# Patient Record
Sex: Male | Born: 1960
Health system: Southern US, Community
[De-identification: ages and names within clinical notes are randomized; demographics above are authoritative.]

## PROBLEM LIST (undated history)

## (undated) DIAGNOSIS — I509 Heart failure, unspecified: Secondary | ICD-10-CM

## (undated) DIAGNOSIS — E669 Obesity, unspecified: Secondary | ICD-10-CM

## (undated) DIAGNOSIS — Q254 Congenital malformation of aorta unspecified: Secondary | ICD-10-CM

## (undated) DIAGNOSIS — I10 Essential (primary) hypertension: Secondary | ICD-10-CM

## (undated) DIAGNOSIS — I4891 Unspecified atrial fibrillation: Secondary | ICD-10-CM

## (undated) DIAGNOSIS — I519 Heart disease, unspecified: Secondary | ICD-10-CM

## (undated) DIAGNOSIS — M1990 Unspecified osteoarthritis, unspecified site: Secondary | ICD-10-CM

## (undated) DIAGNOSIS — I4819 Other persistent atrial fibrillation: Secondary | ICD-10-CM

## (undated) DIAGNOSIS — J90 Pleural effusion, not elsewhere classified: Secondary | ICD-10-CM

## (undated) DIAGNOSIS — G4733 Obstructive sleep apnea (adult) (pediatric): Secondary | ICD-10-CM

## (undated) DIAGNOSIS — I2699 Other pulmonary embolism without acute cor pulmonale: Secondary | ICD-10-CM

## (undated) DIAGNOSIS — E785 Hyperlipidemia, unspecified: Secondary | ICD-10-CM

## (undated) DIAGNOSIS — R911 Solitary pulmonary nodule: Secondary | ICD-10-CM

## (undated) HISTORY — DX: Obesity, unspecified: E66.9

## (undated) HISTORY — PX: PROSTATECTOMY: SHX69

## (undated) HISTORY — PX: VASECTOMY: SHX75

## (undated) HISTORY — DX: Pleural effusion, not elsewhere classified: J90

## (undated) HISTORY — PX: PROSTATE BIOPSY: SHX241

## (undated) HISTORY — DX: Solitary pulmonary nodule: R91.1

## (undated) HISTORY — PX: SHOULDER SURGERY: SHX246

## (undated) HISTORY — DX: Heart disease, unspecified: I51.9

## (undated) HISTORY — DX: Hyperlipidemia, unspecified: E78.5

## (undated) HISTORY — PX: APPENDECTOMY: SHX54

## (undated) HISTORY — DX: Other persistent atrial fibrillation: I48.19

## (undated) HISTORY — DX: Heart failure, unspecified: I50.9

## (undated) HISTORY — DX: Essential (primary) hypertension: I10

## (undated) HISTORY — DX: Congenital malformation of aorta unspecified: Q25.40

---
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
## 2015-08-04 ENCOUNTER — Ambulatory Visit (INDEPENDENT_AMBULATORY_CARE_PROVIDER_SITE_OTHER): Payer: Worker's Compensation | Admitting: Family Medicine

## 2015-08-04 ENCOUNTER — Ambulatory Visit: Payer: Worker's Compensation

## 2015-08-04 VITALS — BP 108/84 | HR 81 | Temp 98.2°F | Resp 16 | Ht 69.0 in | Wt 231.4 lb

## 2015-08-04 DIAGNOSIS — M79644 Pain in right finger(s): Secondary | ICD-10-CM

## 2015-08-04 DIAGNOSIS — S61011A Laceration without foreign body of right thumb without damage to nail, initial encounter: Secondary | ICD-10-CM

## 2015-08-04 DIAGNOSIS — Z23 Encounter for immunization: Secondary | ICD-10-CM | POA: Diagnosis not present

## 2015-08-04 NOTE — Progress Notes (Signed)
Procedure: Verbal consent obtained. Skin was anesthetized with 3 cc 1% lido without epi and cleaned with soap and water. Wound explored -  No tendon involvement. FROM and strength intact. Laceration located to right thumb was sutured with #2 horizontal 5.0 ethilon sutures. Wound was dressed and wound care discussed.

## 2015-08-04 NOTE — Patient Instructions (Signed)
WOUND CARE Please return in 10 days to have your stitches/staples removed or sooner if you have concerns. . Keep area clean and dry with dressing in place for 24 hours. . After 24 hours, remove bandage and wash wound gently with mild soap and warm water. When skin is dry, reapply a new bandage. Once wound is no longer draining, may leave wound open to air. . Continue daily cleansing with soap and water until stitches/staples are removed. . Do not apply any ointments or creams to the wound while stitches/staples are in place, as this may cause delayed healing. . Notify the office if you experience any of the following signs of infection: Swelling, redness, pus drainage, streaking, fever >101.0 F . Notify the office if you experience excessive bleeding that does not stop after 15-20 minutes of constant, firm pressure.   

## 2015-08-04 NOTE — Progress Notes (Signed)
      Chief Complaint:  Chief Complaint  Patient presents with  . Hand Pain    right hand,right thumb, hurt his thunb while working on a car    HPI: Jeffrey Griffin is a 54 y.o. male who reports to [facility] today complaining of right thumb pain after hitting it against a radiator while working on a [employer] car at work, he was trying to turn a Advertising account planner and overturned it and hit the right thumb on a sharp corner. He has pain and swelling, and decrease ROM, He is worried about having a tendon rupture. He is not UTD on tetanus. He is right hand dominant. Pain radiates up to wrist . He cleaned it with soap and water.   History reviewed. No pertinent past medical history. Past Surgical History  Procedure Laterality Date  . Vasectomy     Social History   Social History  . Marital Status: Single    Spouse Name: N/A  . Number of Children: N/A  . Years of Education: N/A   Social History Main Topics  . Smoking status: Never Smoker   . Smokeless tobacco: None  . Alcohol Use: 0.0 oz/week    0 Standard drinks or equivalent per week  . Drug Use: None  . Sexual Activity: Not Asked   Other Topics Concern  . None   Social History Narrative  . None   History reviewed. No pertinent family history. No Known Allergies Prior to Admission medications   Not on File     ROS: The patient denies fevers, chills, night sweats, unintentional weight loss, chest pain, palpitations, wheezing, dyspnea on exertion, nausea, vomiting, abdominal pain, dysuria, hematuria, melena  All other systems have been reviewed and were otherwise negative with the exception of those mentioned in the HPI and as above.    PHYSICAL EXAM: Filed Vitals:   08/04/15 1925  BP: 108/84  Pulse: 81  Temp: 98.2 F (36.8 C)  Resp: 16   Body mass index is 34.16 kg/(m^2).   General: Alert, no acute distress HEENT:  Normocephalic, atraumatic, oropharynx patent. EOMI, PERRLA Cardiovascular:  Regular rate and rhythm, no rubs  murmurs or gallops.   Respiratory: Clear to auscultation bilaterally.  No wheezes, rales, or rhonchi.  No cyanosis, no use of accessory musculature Abdominal: No organomegaly, abdomen is soft and non-tender, positive bowel sounds. No masses. Skin: No rashes. Neurologic: Facial musculature symmetric. Psychiatric: Patient acts appropriately throughout our interaction. Musculoskeletal: Gait intact.  Right thumb Decrease ROM due to  anxiety about possible tendon rupture Full sensation Vascularly intact, patient was numbed and per PA-C he had full ROM and also no neurovasc compromise 5/5 sternght 1 inc laceration, no erythema, + swelling   LABS: No results found for this or any previous visit.   EKG/XRAY:   Primary read interpreted by Dr. Marin Comment at [facility]. Neg for fracture or dislocation    ASSESSMENT/PLAN: Encounter Diagnoses  Name Primary?  . Thumb pain, right Yes  . Thumb laceration, right, initial encounter   . Need for prophylactic vaccination with tetanus-diphtheria (TD)    Tetanus vaccine give Decline pain meds No abx at this time, monitor for infection Fu prn or as directed Wound care as directed   Gross sideeffects, risk and benefits, and alternatives of medications d/w patient. Patient is aware that all medications have potential sideeffects and we are unable to predict every sideeffect or drug-drug interaction that may occur.  Takahiro Godinho DO  08/04/2015 8:12 PM

## 2015-08-14 ENCOUNTER — Other Ambulatory Visit: Payer: Self-pay | Admitting: Family Medicine

## 2015-08-14 ENCOUNTER — Ambulatory Visit (INDEPENDENT_AMBULATORY_CARE_PROVIDER_SITE_OTHER): Payer: Managed Care, Other (non HMO) | Admitting: Family Medicine

## 2015-08-14 VITALS — BP 126/80 | HR 51 | Temp 97.6°F | Resp 18 | Ht 69.75 in | Wt 229.0 lb

## 2015-08-14 DIAGNOSIS — E785 Hyperlipidemia, unspecified: Secondary | ICD-10-CM

## 2015-08-14 DIAGNOSIS — Z0000 Encounter for general adult medical examination without abnormal findings: Secondary | ICD-10-CM | POA: Diagnosis not present

## 2015-08-14 LAB — LIPID PANEL
Cholesterol: 224 mg/dL — ABNORMAL HIGH (ref 125–200)
HDL: 42 mg/dL (ref >=40)
LDL Cholesterol: 154 mg/dL — ABNORMAL HIGH (ref <130)
Total CHOL/HDL Ratio: 5.3 Ratio — ABNORMAL HIGH (ref <=5.0)
Triglycerides: 140 mg/dL (ref <150)
VLDL: 28 mg/dL (ref <30)

## 2015-08-14 LAB — CBC WITH DIFFERENTIAL/PLATELET
Basophils Absolute: 0.1 10*3/uL (ref 0.0–0.1)
Basophils Relative: 1 % (ref 0–1)
Eosinophils Absolute: 0.2 10*3/uL (ref 0.0–0.7)
Eosinophils Relative: 3 % (ref 0–5)
HCT: 46.8 % (ref 39.0–52.0)
Hemoglobin: 16 g/dL (ref 13.0–17.0)
Lymphocytes Relative: 26 % (ref 12–46)
Lymphs Abs: 1.4 10*3/uL (ref 0.7–4.0)
MCH: 29.9 pg (ref 26.0–34.0)
MCHC: 34.2 g/dL (ref 30.0–36.0)
MCV: 87.5 fL (ref 78.0–100.0)
MPV: 10.2 fL (ref 8.6–12.4)
Monocytes Absolute: 0.6 10*3/uL (ref 0.1–1.0)
Monocytes Relative: 11 % (ref 3–12)
Neutro Abs: 3.2 10*3/uL (ref 1.7–7.7)
Neutrophils Relative %: 59 % (ref 43–77)
Platelets: 246 10*3/uL (ref 150–400)
RBC: 5.35 MIL/uL (ref 4.22–5.81)
RDW: 12.9 % (ref 11.5–15.5)
WBC: 5.5 10*3/uL (ref 4.0–10.5)

## 2015-08-14 LAB — COMPLETE METABOLIC PANEL WITH GFR
ALT: 26 U/L (ref 9–46)
AST: 22 U/L (ref 10–35)
Albumin: 4.2 g/dL (ref 3.6–5.1)
Alkaline Phosphatase: 84 U/L (ref 40–115)
BUN: 15 mg/dL (ref 7–25)
CO2: 28 mmol/L (ref 20–31)
Calcium: 9.9 mg/dL (ref 8.6–10.3)
Chloride: 100 mmol/L (ref 98–110)
Creat: 0.8 mg/dL (ref 0.70–1.33)
GFR, Est African American: >89 mL/min (ref >=60)
GFR, Est Non African American: >89 mL/min (ref >=60)
Glucose, Bld: 105 mg/dL — ABNORMAL HIGH (ref 65–99)
Potassium: 4.9 mmol/L (ref 3.5–5.3)
Sodium: 134 mmol/L — ABNORMAL LOW (ref 135–146)
Total Bilirubin: 0.5 mg/dL (ref 0.2–1.2)
Total Protein: 6.9 g/dL (ref 6.1–8.1)

## 2015-08-14 LAB — POCT URINALYSIS DIP (MANUAL ENTRY)
Bilirubin, UA: NEGATIVE
Blood, UA: NEGATIVE
Glucose, UA: NEGATIVE
Ketones, POC UA: NEGATIVE
Leukocytes, UA: NEGATIVE
Nitrite, UA: NEGATIVE
Protein Ur, POC: NEGATIVE
Spec Grav, UA: 1.025
Urobilinogen, UA: 0.2
pH, UA: 6

## 2015-08-14 MED ORDER — ATORVASTATIN CALCIUM 10 MG PO TABS: 10.0000 mg | ORAL_TABLET | Freq: Every day | ORAL | Status: DC

## 2015-08-14 NOTE — Progress Notes (Signed)
Patient ID: Jeffrey Griffin, male   DOB: [birth year], 54 y.o.   MRN: [id number]   This chart was scribed for Jeffrey Haber, MD by [facility], medical scribe at [facility].The patient was seen in exam room 13 and the patient's care was started at 9:58 AM.  Patient ID: [name] MRN: [id number], DOB: [birth year], 54 y.o. Date of Encounter: 08/14/2015  Primary Physician: No primary care provider on file.  Chief Complaint:  Chief Complaint  Patient presents with  . Annual Exam    CPE   HPI:  [name] is a 54 y.o. male who presents to [facility] for and annual exam. Cut his right thumb recently and has stiches in place. Has some right shoulder pain and numbness/tingling running down his right arm. No notable FHx. Declines the flu shot today. He would like to schedule a colonoscopy.  He is a Dealer, works 9 hr/day.  History reviewed. No pertinent past medical history.   Home Meds: Prior to Admission medications   Not on File   Allergies: No Known Allergies  Social History   Social History  . Marital Status: Single    Spouse Name: N/A  . Number of Children: N/A  . Years of Education: N/A   Occupational History  . Not on file.   Social History Main Topics  . Smoking status: Never Smoker   . Smokeless tobacco: Not on file  . Alcohol Use: 0.0 oz/week    0 Standard drinks or equivalent per week  . Drug Use: Not on file  . Sexual Activity: Not on file   Other Topics Concern  . Not on file   Social History Narrative    Review of Systems: Constitutional: negative for chills, fever, night sweats, weight changes, or fatigue  HEENT: negative for vision changes, hearing loss, congestion, rhinorrhea, ST, epistaxis, or sinus pressure Cardiovascular: negative for chest pain or palpitations Respiratory: negative for hemoptysis, wheezing, shortness of breath, or cough Abdominal: negative for abdominal pain, nausea, vomiting, diarrhea,  or constipation. Msk. Right shoulder pain. Dermatological: negative for rash Neurologic: negative for headache, dizziness, or syncope. Positive for numbness/tingling.  All other systems reviewed and are otherwise negative with the exception to those above and in the HPI.  Physical Exam: Blood pressure 126/80, pulse 51, temperature 97.6 F (36.4 C), temperature source Oral, resp. rate 18, height 5' 9.75" (1.772 m), weight 229 lb (103.874 kg), SpO2 98 %., Body mass index is 33.08 kg/(m^2). General: Well developed, well nourished, in no acute distress. Head: Normocephalic, atraumatic, eyes without discharge, sclera non-icteric, nares are without discharge. Bilateral auditory canals clear, TM's are without perforation, pearly grey and translucent with reflective cone of light bilaterally. Oral cavity moist, posterior pharynx without exudate, erythema, peritonsillar abscess, or post nasal drip.  Neck: Supple. No thyromegaly. Full ROM. No lymphadenopathy. Lungs: Clear bilaterally to auscultation without wheezes, rales, or rhonchi. Breathing is unlabored. Heart: RRR with S1 S2. No murmurs, rubs, or gallops appreciated. Abdomen: Soft, non-tender, non-distended with normoactive bowel sounds. No hepatomegaly. No rebound/guarding. No obvious abdominal masses. Msk:  Strength and tone normal for age. Extremities/Skin: Warm and dry. No clubbing or cyanosis. No edema. No rashes or suspicious lesions. Sutures in right thumb were removed. The wound is well-healed and patient has full range of motion. Neuro: Alert and oriented X 3. Moves all extremities spontaneously. Gait is normal. CNII-XII grossly in tact. Psych:  Responds to questions appropriately with a normal affect.  ASSESSMENT AND PLAN:  54 y.o. year old male with generally good health. This chart was scribed in my presence and reviewed by me personally.    ICD-9-CM ICD-10-CM   1. Annual physical exam V70.0 123456 COMPLETE METABOLIC PANEL WITH GFR      Lipid panel     POCT urinalysis dipstick     Ambulatory referral to Gastroenterology     CBC with Differential/Platelet     CANCELED: POCT CBC     By signing my name below, I, Nadim Abuhashem, attest that this documentation has been prepared under the direction and in the presence of Jeffrey Haber, MD.  Electronically Signed: Lora Havens, medical scribe. 08/14/2015 10:24 AM.

## 2015-08-14 NOTE — Patient Instructions (Signed)

## 2015-10-09 ENCOUNTER — Ambulatory Visit (INDEPENDENT_AMBULATORY_CARE_PROVIDER_SITE_OTHER): Payer: Managed Care, Other (non HMO)

## 2015-10-09 ENCOUNTER — Ambulatory Visit (INDEPENDENT_AMBULATORY_CARE_PROVIDER_SITE_OTHER): Payer: Managed Care, Other (non HMO) | Admitting: Emergency Medicine

## 2015-10-09 VITALS — BP 148/94 | HR 62 | Temp 97.9°F | Resp 16 | Ht 69.5 in | Wt 232.4 lb

## 2015-10-09 DIAGNOSIS — R059 Cough, unspecified: Secondary | ICD-10-CM

## 2015-10-09 DIAGNOSIS — R05 Cough: Secondary | ICD-10-CM | POA: Diagnosis not present

## 2015-10-09 DIAGNOSIS — R0981 Nasal congestion: Secondary | ICD-10-CM

## 2015-10-09 MED ORDER — AMOXICILLIN 875 MG PO TABS: 875.0000 mg | ORAL_TABLET | Freq: Two times a day (BID) | ORAL | Status: DC

## 2015-10-09 MED ORDER — FLUTICASONE PROPIONATE 50 MCG/ACT NA SUSP: 2.0000 | Freq: Every day | NASAL | Status: DC

## 2015-10-09 MED ORDER — BENZONATATE 100 MG PO CAPS: 100.0000 mg | ORAL_CAPSULE | Freq: Three times a day (TID) | ORAL | Status: DC | PRN

## 2015-10-09 NOTE — Progress Notes (Addendum)
By signing my name below, I, Jeffrey Griffin, attest that this documentation has been prepared under the direction and in the presence of Jeffrey Queen, MD. Electronically Signed: Moises Griffin, [place]. 10/09/2015 , 11:06 AM .  Patient was seen in room 1 .  Chief Complaint:  Chief Complaint  Patient presents with  . Cough    Non productive  . Facial Pain    Right side, sinuses per pt    HPI: Jeffrey Griffin is a 55 y.o. male who reports to [facility] today complaining of intermittent cough with right sided sinus pressure that started about 6 weeks ago. He describes the cough being like a tickle in his throat. He's tried cough drops without any relief. When he blows his nose, it'd be clear. He denies history of smoking. He denies any changes at home. He denies feeling sick.   He works as a Dealer for 35 years. He denies any different.   History reviewed. No pertinent past medical history. Past Surgical History  Procedure Laterality Date  . Vasectomy    . Appendectomy     Social History   Social History  . Marital Status: Single    Spouse Name: N/A  . Number of Children: N/A  . Years of Education: N/A   Social History Main Topics  . Smoking status: Never Smoker   . Smokeless tobacco: None  . Alcohol Use: 0.0 oz/week    0 Standard drinks or equivalent per week  . Drug Use: None  . Sexual Activity: Not Asked   Other Topics Concern  . None   Social History Narrative   History reviewed. No pertinent family history. No Known Allergies Prior to Admission medications   Medication Sig Start Date End Date Taking? Authorizing Provider  atorvastatin (LIPITOR) 10 MG tablet Take 1 tablet (10 mg total) by mouth daily. Patient not taking: Reported on 10/09/2015 08/14/15   Robyn Haber, MD     ROS:  Constitutional: negative for fever, chills, night sweats, weight changes, or fatigue  HEENT: negative for vision changes, hearing loss, congestion, rhinorrhea, ST, epistaxis; positive  for sinus pressure Cardiovascular: negative for chest pain or palpitations Respiratory: negative for hemoptysis, wheezing, shortness of breath; positive for cough Abdominal: negative for abdominal pain, nausea, vomiting, diarrhea, or constipation Dermatological: negative for rash Neurologic: negative for headache, dizziness, or syncope All other systems reviewed and are otherwise negative with the exception to those above and in the HPI.  PHYSICAL EXAM: Filed Vitals:   10/09/15 1035  BP: 148/94  Pulse: 62  Temp: 97.9 F (36.6 C)  Resp: 16   Body mass index is 33.84 kg/(m^2).   General: Alert, no acute distress HEENT:  Normocephalic, atraumatic, oropharynx patent. Purulent discharge both nares, right maxillary sinus tenderness Eye: EOMI, PEERLDC Cardiovascular:  Regular rate and rhythm, no rubs murmurs or gallops.  No Carotid bruits, radial pulse intact. No pedal edema.  Respiratory: Clear to auscultation bilaterally.  No wheezes, rales, or rhonchi.  No cyanosis, no use of accessory musculature Abdominal: No organomegaly, abdomen is soft and non-tender, positive bowel sounds. No masses. Musculoskeletal: Gait intact. No edema, tenderness Skin: No rashes. Neurologic: Facial musculature symmetric. Psychiatric: Patient acts appropriately throughout our interaction.  Lymphatic: No cervical or submandibular lymphadenopathy Genitourinary/Anorectal: No acute findings  LABS:   EKG/XRAY:   Primary read interpreted by Dr. Everlene Farrier at [facility]. Dg Sinus 1-2 Views  10/09/2015  CLINICAL DATA:  Sinus congestion. EXAM: PARANASAL SINUSES - 1-2 VIEW COMPARISON:  None. FINDINGS: The  paranasal sinus are aerated. There is no evidence of sinus opacification air-fluid levels or mucosal thickening. No significant bone abnormalities are seen. IMPRESSION: No evidence of sinusitis. Electronically Signed   By: Marijo Conception, M.D.   On: 10/09/2015 12:03   Dg Chest 2 View  10/09/2015  CLINICAL DATA:  Cough for 6  weeks. EXAM: CHEST  2 VIEW COMPARISON:  None. FINDINGS: The heart size and mediastinal contours are within normal limits. Both lungs are clear. No pneumothorax or pleural effusion is noted. The visualized skeletal structures are unremarkable. IMPRESSION: No active cardiopulmonary disease. Electronically Signed   By: Marijo Conception, M.D.   On: 10/09/2015 12:02     ASSESSMENT/PLAN: We'll treat as sinusitis with Flonase amoxicillin and Tessalon Perles. X-rays of the chest and sinuses were unremarkable.I personally performed the services described in this documentation, which was scribed in my presence. The recorded information has been reviewed and is accurate.   Gross sideeffects, risk and benefits, and alternatives of medications d/w patient. Patient is aware that all medications have potential sideeffects and we are unable to predict every sideeffect or drug-drug interaction that may occur.  Jeffrey Queen MD 10/09/2015 11:06 AM

## 2015-10-09 NOTE — Patient Instructions (Addendum)
Because you received an x-ray today, you will receive an invoice from [facility]. Please contact [facility] at [phone] with questions or concerns regarding your invoice. Our billing staff will not be able to assist you with those questions. sinusitisSinusitis, Adult Sinusitis is redness, soreness, and inflammation of the paranasal sinuses. Paranasal sinuses are air pockets within the bones of your face. They are located beneath your eyes, in the middle of your forehead, and above your eyes. In healthy paranasal sinuses, mucus is able to drain out, and air is able to circulate through them by way of your nose. However, when your paranasal sinuses are inflamed, mucus and air can become trapped. This can allow bacteria and other germs to grow and cause infection. Sinusitis can develop quickly and last only a short time (acute) or continue over a long period (chronic). Sinusitis that lasts for more than 12 weeks is considered chronic. CAUSES Causes of sinusitis include:  Allergies.  Structural abnormalities, such as displacement of the cartilage that separates your nostrils (deviated septum), which can decrease the air flow through your nose and sinuses and affect sinus drainage.  Functional abnormalities, such as when the small hairs (cilia) that line your sinuses and help remove mucus do not work properly or are not present. SIGNS AND SYMPTOMS Symptoms of acute and chronic sinusitis are the same. The primary symptoms are pain and pressure around the affected sinuses. Other symptoms include:  Upper toothache.  Earache.  Headache.  Bad breath.  Decreased sense of smell and taste.  A cough, which worsens when you are lying flat.  Fatigue.  Fever.  Thick drainage from your nose, which often is green and may contain pus (purulent).  Swelling and warmth over the affected sinuses. DIAGNOSIS Your health care provider will perform a physical exam. During your exam,  your health care provider may perform any of the following to help determine if you have acute sinusitis or chronic sinusitis:  Look in your nose for signs of abnormal growths in your nostrils (nasal polyps).  Tap over the affected sinus to check for signs of infection.  View the inside of your sinuses using an imaging device that has a light attached (endoscope). If your health care provider suspects that you have chronic sinusitis, one or more of the following tests may be recommended:  Allergy tests.  Nasal culture. A sample of mucus is taken from your nose, sent to a lab, and screened for bacteria.  Nasal cytology. A sample of mucus is taken from your nose and examined by your health care provider to determine if your sinusitis is related to an allergy. TREATMENT Most cases of acute sinusitis are related to a viral infection and will resolve on their own within 10 days. Sometimes, medicines are prescribed to help relieve symptoms of both acute and chronic sinusitis. These may include pain medicines, decongestants, nasal steroid sprays, or saline sprays. However, for sinusitis related to a bacterial infection, your health care provider will prescribe antibiotic medicines. These are medicines that will help kill the bacteria causing the infection. Rarely, sinusitis is caused by a fungal infection. In these cases, your health care provider will prescribe antifungal medicine. For some cases of chronic sinusitis, surgery is needed. Generally, these are cases in which sinusitis recurs more than 3 times per year, despite other treatments. HOME CARE INSTRUCTIONS  Drink plenty of water. Water helps thin the mucus so your sinuses can drain more easily.  Use a humidifier.  Inhale steam 3-4 times  a day (for example, sit in the bathroom with the shower running).  Apply a warm, moist washcloth to your face 3-4 times a day, or as directed by your health care provider.  Use saline nasal sprays to  help moisten and clean your sinuses.  Take medicines only as directed by your health care provider.  If you were prescribed either an antibiotic or antifungal medicine, finish it all even if you start to feel better. SEEK IMMEDIATE MEDICAL CARE IF:  You have increasing pain or severe headaches.  You have nausea, vomiting, or drowsiness.  You have swelling around your face.  You have vision problems.  You have a stiff neck.  You have difficulty breathing.   This information is not intended to replace advice given to you by your health care provider. Make sure you discuss any questions you have with your health care provider.   Document Released: 08/21/2005 Document Revised: 09/11/2014 Document Reviewed: 09/05/2011 Elsevier Interactive Patient Education [employer].

## 2015-10-12 ENCOUNTER — Ambulatory Visit: Payer: Self-pay | Admitting: Family Medicine

## 2015-10-15 ENCOUNTER — Telehealth: Payer: Self-pay

## 2015-10-15 NOTE — Telephone Encounter (Signed)
DR L - Pt had a physical with you in December.  Needs form filled out for his health insurance.  I have placed this in your box.  He wants the completed form faxed to [phone] and also wants to pick up a copy.  His number is [phone].

## 2015-10-19 ENCOUNTER — Ambulatory Visit (INDEPENDENT_AMBULATORY_CARE_PROVIDER_SITE_OTHER): Payer: Managed Care, Other (non HMO)

## 2015-10-19 ENCOUNTER — Ambulatory Visit (INDEPENDENT_AMBULATORY_CARE_PROVIDER_SITE_OTHER): Payer: Managed Care, Other (non HMO) | Admitting: Physician Assistant

## 2015-10-19 VITALS — BP 150/82 | HR 83 | Temp 100.5°F | Resp 18 | Ht 70.0 in | Wt 234.6 lb

## 2015-10-19 DIAGNOSIS — R05 Cough: Secondary | ICD-10-CM

## 2015-10-19 DIAGNOSIS — R6889 Other general symptoms and signs: Secondary | ICD-10-CM

## 2015-10-19 DIAGNOSIS — R059 Cough, unspecified: Secondary | ICD-10-CM

## 2015-10-19 DIAGNOSIS — J101 Influenza due to other identified influenza virus with other respiratory manifestations: Secondary | ICD-10-CM

## 2015-10-19 LAB — POCT CBC
GRANULOCYTE PERCENT: 81.5 % — AB (ref 37–80)
HEMATOCRIT: 42.7 % — AB (ref 43.5–53.7)
HEMOGLOBIN: 14.7 g/dL (ref 14.1–18.1)
LYMPH, POC: 0.7 (ref 0.6–3.4)
MCH, POC: 30.4 pg (ref 27–31.2)
MCHC: 34.3 g/dL (ref 31.8–35.4)
MCV: 88.6 fL (ref 80–97)
MID (cbc): 0.5 (ref 0–0.9)
MPV: 7.5 fL (ref 0–99.8)
PLATELET COUNT, POC: 194 10*3/uL (ref 142–424)
POC GRANULOCYTE: 5.4 (ref 2–6.9)
POC LYMPH PERCENT: 10.5 %L (ref 10–50)
POC MID %: 8 %M (ref 0–12)
RBC: 4.82 M/uL (ref 4.69–6.13)
RDW, POC: 13.3 %
WBC: 6.6 10*3/uL (ref 4.6–10.2)

## 2015-10-19 LAB — POCT INFLUENZA A/B
INFLUENZA A, POC: POSITIVE — AB
INFLUENZA B, POC: NEGATIVE

## 2015-10-19 MED ORDER — GUAIFENESIN ER 1200 MG PO TB12: 1.0000 | ORAL_TABLET | Freq: Two times a day (BID) | ORAL | Status: AC

## 2015-10-19 MED ORDER — HYDROCOD POLST-CPM POLST ER 10-8 MG/5ML PO SUER: 5.0000 mL | Freq: Two times a day (BID) | ORAL | Status: DC | PRN

## 2015-10-19 MED ORDER — OSELTAMIVIR PHOSPHATE 75 MG PO CAPS: 75.0000 mg | ORAL_CAPSULE | Freq: Two times a day (BID) | ORAL | Status: AC

## 2015-10-19 NOTE — Patient Instructions (Addendum)
Because you received an x-ray today, you will receive an invoice from [facility] Radiology. Please contact [facility]iology at [phone] with questions or concerns regarding your invoice. Our billing staff will not be able to assist you with those questions.   Please push fluids.  Tylenol and Motrin for fever and body aches.    A humidifier can help especially when the air is dry -if you do not have a humidifier you can boil a pot of water on the stove in your home to help with the dry air.

## 2015-10-19 NOTE — Telephone Encounter (Signed)
Forms completed. Spoke with pt's significant other on the phone - let her know forms ready to be picked up.

## 2015-10-19 NOTE — Progress Notes (Signed)
Jeffrey Griffin  MRN: [id number] DOB: 1960/10/05  Subjective:  Pt presents to clinic for recheck.  He has not started to feel better since he was seen 2/4.  He has only 1 pill of the abx left and really does not feel like his symptoms have improved.  Then 2 days ago he started feeling terrible with headaches and myalgias and fever.  His chest wall is hurting from all the cough.    Home treatment - theraflu Sick contacts - girlfriend with the flu No flu vaccine this year.   There are no active problems to display for this patient.   Current Outpatient Prescriptions on File Prior to Visit  Medication Sig Dispense Refill  . amoxicillin (AMOXIL) 875 MG tablet Take 1 tablet (875 mg total) by mouth 2 (two) times daily. 20 tablet 0  . fluticasone (FLONASE) 50 MCG/ACT nasal spray Place 2 sprays into both nostrils daily. 16 g 6   No current facility-administered medications on file prior to visit.    No Known Allergies  Review of Systems  Constitutional: Positive for fever (101.5 ) and chills.  HENT: Positive for congestion and rhinorrhea (yellow).   Respiratory: Positive for cough (sputum for the last 2 days - yellow) and chest tightness. Negative for shortness of breath and wheezing.        No h/o asthma, nonsmoker   Gastrointestinal: Negative.   Musculoskeletal: Positive for myalgias.  Neurological: Positive for headaches.   Objective:  BP 150/82 mmHg  Pulse 83  Temp(Src) 100.5 F (38.1 C) (Oral)  Resp 18  Ht 5\' 10"  (1.778 m)  Wt 234 lb 9.6 oz (106.414 kg)  BMI 33.66 kg/m2  SpO2 95%  Physical Exam  Constitutional: He is oriented to person, place, and time and well-developed, well-nourished, and in no distress.  Pt is laying under blankets and appears to not feel well.  HENT:  Head: Normocephalic and atraumatic.  Right Ear: Hearing, tympanic membrane, external ear and ear canal normal.  Left Ear: Hearing, tympanic membrane, external ear and ear canal normal.  Nose: Nose  normal.  Mouth/Throat: Uvula is midline, oropharynx is clear and moist and mucous membranes are normal.  Eyes: Conjunctivae are normal.  Neck: Normal range of motion.  Cardiovascular: Normal rate, regular rhythm and normal heart sounds.   Pulmonary/Chest: Effort normal and breath sounds normal. He has no wheezes.  Lymphadenopathy:       Head (right side): No tonsillar adenopathy present.       Head (left side): No tonsillar adenopathy present.    He has no cervical adenopathy.       Right: No supraclavicular adenopathy present.       Left: No supraclavicular adenopathy present.  Neurological: He is alert and oriented to person, place, and time. Gait normal.  Skin: Skin is warm and dry.  Psychiatric: Mood, memory, affect and judgment normal.   Results for orders placed or performed in visit on 10/19/15  POCT CBC  Result Value Ref Range   WBC 6.6 4.6 - 10.2 K/uL   Lymph, poc 0.7 0.6 - 3.4   POC LYMPH PERCENT 10.5 10 - 50 %L   MID (cbc) 0.5 0 - 0.9   POC MID % 8.0 0 - 12 %M   POC Granulocyte 5.4 2 - 6.9   Granulocyte percent 81.5 (A) 37 - 80 %G   RBC 4.82 4.69 - 6.13 M/uL   Hemoglobin 14.7 14.1 - 18.1 g/dL   HCT, POC 42.7 (A) 43.5 -  53.7 %   MCV 88.6 80 - 97 fL   MCH, POC 30.4 27 - 31.2 pg   MCHC 34.3 31.8 - 35.4 g/dL   RDW, POC 13.3 %   Platelet Count, POC 194 142 - 424 K/uL   MPV 7.5 0 - 99.8 fL  POCT Influenza A/B  Result Value Ref Range   Influenza A, POC Positive (A) Negative   Influenza B, POC Negative Negative    Dg Sinus 1-2 Views  10/09/2015  CLINICAL DATA:  Sinus congestion. EXAM: PARANASAL SINUSES - 1-2 VIEW COMPARISON:  None. FINDINGS: The paranasal sinus are aerated. There is no evidence of sinus opacification air-fluid levels or mucosal thickening. No significant bone abnormalities are seen. IMPRESSION: No evidence of sinusitis. Electronically Signed   By: Marijo Conception, M.D.   On: 10/09/2015 12:03   Dg Chest 2 View  10/19/2015  CLINICAL DATA:  Follow-up  visit.  Fever and cough. EXAM: CHEST  2 VIEW COMPARISON:  10/09/2015 radiographs. FINDINGS: The heart size and mediastinal contours are normal. The lungs are clear. There is no pleural effusion or pneumothorax. No acute osseous findings are identified. IMPRESSION: Stable chest.  No active cardiopulmonary process. Electronically Signed   By: Richardean Sale M.D.   On: 10/19/2015 09:32   Dg Chest 2 View  10/09/2015  CLINICAL DATA:  Cough for 6 weeks. EXAM: CHEST  2 VIEW COMPARISON:  None. FINDINGS: The heart size and mediastinal contours are within normal limits. Both lungs are clear. No pneumothorax or pleural effusion is noted. The visualized skeletal structures are unremarkable. IMPRESSION: No active cardiopulmonary disease. Electronically Signed   By: Marijo Conception, M.D.   On: 10/09/2015 12:02    Assessment and Plan :  Flu-like symptoms - Plan: POCT Influenza A/B  Cough - Plan: POCT CBC, DG Chest 2 View  Influenza A - Plan: Guaifenesin (MUCINEX MAXIMUM STRENGTH) 1200 MG TB12, oseltamivir (TAMIFLU) 75 MG capsule, chlorpheniramine-HYDROcodone (TUSSIONEX PENNKINETIC ER) 10-8 MG/5ML SUER, Care order/instruction   Symptomatic care d/w pt.  Windell Hummingbird PA-C  [facility] 10/19/2015 10:00 AM

## 2015-11-11 ENCOUNTER — Ambulatory Visit (INDEPENDENT_AMBULATORY_CARE_PROVIDER_SITE_OTHER): Payer: Managed Care, Other (non HMO) | Admitting: Family Medicine

## 2015-11-11 VITALS — BP 130/88 | HR 96 | Temp 98.0°F | Resp 16 | Ht 69.0 in | Wt 232.4 lb

## 2015-11-11 DIAGNOSIS — R05 Cough: Secondary | ICD-10-CM | POA: Diagnosis not present

## 2015-11-11 DIAGNOSIS — J101 Influenza due to other identified influenza virus with other respiratory manifestations: Secondary | ICD-10-CM

## 2015-11-11 DIAGNOSIS — R040 Epistaxis: Secondary | ICD-10-CM

## 2015-11-11 DIAGNOSIS — R059 Cough, unspecified: Secondary | ICD-10-CM

## 2015-11-11 MED ORDER — PREDNISONE 20 MG PO TABS: ORAL_TABLET | ORAL | Status: DC

## 2015-11-11 MED ORDER — ALBUTEROL SULFATE 108 (90 BASE) MCG/ACT IN AEPB: 2.0000 | INHALATION_SPRAY | Freq: Four times a day (QID) | RESPIRATORY_TRACT | Status: DC | PRN

## 2015-11-11 MED ORDER — HYDROCOD POLST-CPM POLST ER 10-8 MG/5ML PO SUER: 5.0000 mL | Freq: Two times a day (BID) | ORAL | Status: DC | PRN

## 2015-11-11 NOTE — Progress Notes (Signed)
   Subjective:    Patient ID: Jeffrey Griffin, male    DOB: 1961-03-30, 55 y.o.   MRN: [id number]  HPI    Review of Systems     Objective:   Physical Exam        Assessment & Plan:

## 2015-11-11 NOTE — Patient Instructions (Signed)
Afrin for any continued nasal bleeding

## 2015-11-11 NOTE — Progress Notes (Signed)
Subjective:  This chart was scribed for Robyn Haber MD, by Tamsen Roers, at [facility].  This patient was seen in room 14 and the patient's care was started at 8:50 AM.   Chief Complaint  Patient presents with  . Epistaxis    since 11/11/2015 AM     Patient ID: Jeffrey Griffin, male    DOB: December 01, 1960, 55 y.o.   MRN: [id number]  Epistaxis    HPI Comments: Jeffrey Griffin is a 55 y.o. male who presents to the [facility] complaining of epistaxis onset early this morning (6 am) and states that it started after he had a sneezing fit and blew his nose.  Once he blew it, he states that it started bleeding and had to immediately plug it up as it would not stop.   Patient states that he has had a cough and intermittent sinus problems since December.  He was in [facility] last month and was tested postive for the flu.  He states he feels "great" but cant get rid of his cough and rhinorrhea.  He has associated symptoms of constant post nasal drip.  Patient denies a history of asthma but states that his mother and brother had it.  Patient is a Dealer.   There are no active problems to display for this patient.  No past medical history on file. Past Surgical History  Procedure Laterality Date  . Vasectomy    . Appendectomy     No Known Allergies Prior to Admission medications   Medication Sig Start Date End Date Taking? Authorizing Provider  fluticasone (FLONASE) 50 MCG/ACT nasal spray Place 2 sprays into both nostrils daily. 10/09/15  Yes Darlyne Russian, MD  Multiple Vitamin (MULTIVITAMIN) tablet Take 1 tablet by mouth daily.   Yes Historical Provider, MD  amoxicillin (AMOXIL) 875 MG tablet Take 1 tablet (875 mg total) by mouth 2 (two) times daily. Patient not taking: Reported on 11/11/2015 10/09/15   Darlyne Russian, MD  chlorpheniramine-HYDROcodone [facility] ER) 10-8 MG/5ML SUER Take 5 mLs by mouth 2 (two) times daily as needed for  cough. Patient not taking: Reported on 11/11/2015 10/19/15   Mancel Bale, PA-C   Social History   Social History  . Marital Status: Single    Spouse Name: N/A  . Number of Children: N/A  . Years of Education: N/A   Occupational History  . Not on file.   Social History Main Topics  . Smoking status: Never Smoker   . Smokeless tobacco: Not on file  . Alcohol Use: 0.0 oz/week    0 Standard drinks or equivalent per week  . Drug Use: No  . Sexual Activity: Not on file   Other Topics Concern  . Not on file   Social History Narrative     Review of Systems  Constitutional: Negative for fever and chills.  HENT: Positive for nosebleeds, postnasal drip, rhinorrhea and sneezing. Negative for sore throat.   Eyes: Negative for pain, redness and itching.  Respiratory: Positive for cough.   Gastrointestinal: Negative for nausea and vomiting.  Musculoskeletal: Negative for neck pain and neck stiffness.  Neurological: Negative for seizures, syncope and speech difficulty.       Objective:   Physical Exam  Constitutional: He is oriented to person, place, and time. He appears well-developed and well-nourished. No distress.  HENT:  Head: Normocephalic and atraumatic.  Eyes: Pupils are equal, round, and reactive to light.  Neck: Neck supple.  Cardiovascular: Normal rate.   Pulmonary/Chest: Effort normal and breath sounds normal. No respiratory distress. He has no wheezes. He has no rales.  Musculoskeletal: Normal range of motion.  Neurological: He is alert and oriented to person, place, and time.  Skin: Skin is warm and dry.  Psychiatric: He has a normal mood and affect. His behavior is normal.  Nursing note and vitals reviewed.  Filed Vitals:   11/11/15 0832 11/11/15 0835  BP: 142/88 130/88  Pulse: 96   Temp: 98 F (36.7 C)   TempSrc: Oral   Resp: 16   Height: 5\' 9"  (1.753 m)   Weight: 232 lb 6.4 oz (105.416 kg)   SpO2: 99%    Left septum showed active bleeding until it  was cauterized with silver nitrate. 2 applications were necessary and patient had a good patch but is having left here.       Assessment & Plan:   This chart was scribed in my presence and reviewed by me personally.    ICD-9-CM ICD-10-CM   1. Epistaxis 784.7 R04.0   2. Influenza A 487.1 J10.1   3. Cough 786.2 R05 predniSONE (DELTASONE) 20 MG tablet     chlorpheniramine-HYDROcodone (TUSSIONEX PENNKINETIC ER) 10-8 MG/5ML SUER     Albuterol Sulfate (PROAIR RESPICLICK) 123XX123 (90 Base) MCG/ACT AEPB     Signed, Robyn Haber, MD

## 2017-04-12 ENCOUNTER — Encounter: Payer: Self-pay | Admitting: Podiatry

## 2017-04-12 ENCOUNTER — Telehealth: Payer: Self-pay | Admitting: *Deleted

## 2017-04-12 ENCOUNTER — Ambulatory Visit (INDEPENDENT_AMBULATORY_CARE_PROVIDER_SITE_OTHER): Payer: 59 | Admitting: Podiatry

## 2017-04-12 DIAGNOSIS — L600 Ingrowing nail: Secondary | ICD-10-CM | POA: Diagnosis not present

## 2017-04-12 NOTE — Patient Instructions (Addendum)

## 2017-04-12 NOTE — Progress Notes (Signed)
Subjective:    Patient ID: Jeffrey Griffin, male   DOB: 56 y.o.   MRN: [id number]   HPI patient states she's had chronic pain in his right hallux nail and the third toenail of both feet and is had a number of nails removed bilateral which is been very helpful for him and he wants these removed.    Review of Systems  All other systems reviewed and are negative.       Objective:  Physical Exam  Constitutional: He appears well-developed and well-nourished.  Cardiovascular: Intact distal pulses.   Pulmonary/Chest: Breath sounds normal.  Musculoskeletal: Normal range of motion.  Neurological: He is alert.  Skin: Skin is warm.   neurovascular status found to be intact muscle strength was adequate range of motion within normal limits with a painful right hallux nail both lateral border and across the entire top of the nail and the third nail bilateral are thick and incurvated and painful when palpated     Assessment:    Chronic nail disease with ingrown toenail right hallux third nail bilateral     Plan:    H&P conditions reviewed and conditions discussed at great length. Due to long-standing nature of condition patient wants these fixed understanding procedures had this done before and understands risk. I infiltrated with 180 mg Xylocaine Marcaine mixture and under sterile conditions I removed the right hallux nail and the third nail bilateral exposed matrix and applied phenol 5 applications 30 seconds followed by alcohol lavage and sterile dressing. Gave instructions on soaks and reappoint

## 2017-04-12 NOTE — Progress Notes (Signed)
   Subjective:    Patient ID: Jeffrey Griffin, male    DOB: 11-Mar-1961, 56 y.o.   MRN: [id number]  HPI  Chief Complaint  Patient presents with  . Nail Problem    Big toe rt foot ingrown nail/ has been ongoing problem 20 yrs       Review of Systems  All other systems reviewed and are negative.      Objective:   Physical Exam        Assessment & Plan:

## 2017-04-12 NOTE — Telephone Encounter (Signed)
Pt states had ingrowns removed today, when to start soaks. I told pt to begin tomorrow and soak in solution of choice for 20 minutes twice daily until the area gets a dry hard scab without redness, drainage, swelling or pain. Pt states understanding.

## 2017-05-04 ENCOUNTER — Ambulatory Visit (INDEPENDENT_AMBULATORY_CARE_PROVIDER_SITE_OTHER): Payer: 59 | Admitting: Podiatry

## 2017-05-04 ENCOUNTER — Encounter: Payer: Self-pay | Admitting: Podiatry

## 2017-05-04 DIAGNOSIS — L03031 Cellulitis of right toe: Secondary | ICD-10-CM | POA: Diagnosis not present

## 2017-05-04 MED ORDER — CEPHALEXIN 500 MG PO CAPS: 500.0000 mg | ORAL_CAPSULE | Freq: Two times a day (BID) | ORAL | 1 refills | Status: DC

## 2017-05-04 NOTE — Progress Notes (Signed)
Subjective:    Patient ID: Jeffrey Griffin, male   DOB: 56 y.o.   MRN: [id number]   HPI patient presents concerned about the condition of the third nail right states there is been some redness in the toe and it's mildly tender    ROS      Objective:  Physical Exam neurovascular status intact negative Homans sign was noted with patient's third digit right being mildly red distal with a crusted-like nailbed and slight drainage. The hallux nails bilateral healed very well     Assessment:   Possibility for low-grade paronychia infection of the right third toe      Plan:   Instructed on soaks bandage usage during the day and I did as precautionary measure write him for cephalexin 500 mg twice a day and reappoint if any issues should occur

## 2019-09-15 DIAGNOSIS — I2699 Other pulmonary embolism without acute cor pulmonale: Secondary | ICD-10-CM | POA: Diagnosis not present

## 2019-09-15 DIAGNOSIS — I4891 Unspecified atrial fibrillation: Secondary | ICD-10-CM

## 2019-09-15 DIAGNOSIS — I429 Cardiomyopathy, unspecified: Secondary | ICD-10-CM | POA: Diagnosis not present

## 2019-09-15 DIAGNOSIS — I77810 Thoracic aortic ectasia: Secondary | ICD-10-CM | POA: Diagnosis not present

## 2019-09-15 DIAGNOSIS — I10 Essential (primary) hypertension: Secondary | ICD-10-CM

## 2019-09-15 DIAGNOSIS — I361 Nonrheumatic tricuspid (valve) insufficiency: Secondary | ICD-10-CM | POA: Diagnosis not present

## 2019-09-16 DIAGNOSIS — I5020 Unspecified systolic (congestive) heart failure: Secondary | ICD-10-CM | POA: Diagnosis not present

## 2019-09-16 DIAGNOSIS — I77810 Thoracic aortic ectasia: Secondary | ICD-10-CM | POA: Diagnosis not present

## 2019-09-16 DIAGNOSIS — I4891 Unspecified atrial fibrillation: Secondary | ICD-10-CM | POA: Diagnosis not present

## 2019-09-16 DIAGNOSIS — I420 Dilated cardiomyopathy: Secondary | ICD-10-CM | POA: Diagnosis not present

## 2019-09-16 DIAGNOSIS — I2699 Other pulmonary embolism without acute cor pulmonale: Secondary | ICD-10-CM

## 2019-09-16 DIAGNOSIS — I10 Essential (primary) hypertension: Secondary | ICD-10-CM

## 2019-09-17 DIAGNOSIS — I4891 Unspecified atrial fibrillation: Secondary | ICD-10-CM | POA: Diagnosis not present

## 2019-09-17 DIAGNOSIS — I420 Dilated cardiomyopathy: Secondary | ICD-10-CM | POA: Diagnosis not present

## 2019-09-17 DIAGNOSIS — I77810 Thoracic aortic ectasia: Secondary | ICD-10-CM | POA: Diagnosis not present

## 2019-09-17 DIAGNOSIS — I5020 Unspecified systolic (congestive) heart failure: Secondary | ICD-10-CM | POA: Diagnosis not present

## 2019-09-18 DIAGNOSIS — I4891 Unspecified atrial fibrillation: Secondary | ICD-10-CM | POA: Diagnosis not present

## 2019-09-18 DIAGNOSIS — I420 Dilated cardiomyopathy: Secondary | ICD-10-CM | POA: Diagnosis not present

## 2019-09-18 DIAGNOSIS — I5020 Unspecified systolic (congestive) heart failure: Secondary | ICD-10-CM | POA: Diagnosis not present

## 2019-09-18 DIAGNOSIS — I77810 Thoracic aortic ectasia: Secondary | ICD-10-CM | POA: Diagnosis not present

## 2019-09-29 ENCOUNTER — Ambulatory Visit: Payer: Self-pay | Admitting: Cardiology

## 2019-10-01 ENCOUNTER — Encounter: Payer: Self-pay | Admitting: *Deleted

## 2019-10-01 ENCOUNTER — Ambulatory Visit: Payer: 59 | Admitting: Cardiovascular Disease

## 2019-10-01 ENCOUNTER — Other Ambulatory Visit: Payer: Self-pay

## 2019-10-01 ENCOUNTER — Encounter: Payer: Self-pay | Admitting: Cardiovascular Disease

## 2019-10-01 VITALS — BP 114/80 | HR 117 | Ht 69.0 in | Wt 243.0 lb

## 2019-10-01 DIAGNOSIS — I4819 Other persistent atrial fibrillation: Secondary | ICD-10-CM | POA: Diagnosis not present

## 2019-10-01 DIAGNOSIS — I5023 Acute on chronic systolic (congestive) heart failure: Secondary | ICD-10-CM | POA: Diagnosis not present

## 2019-10-01 DIAGNOSIS — I2699 Other pulmonary embolism without acute cor pulmonale: Secondary | ICD-10-CM | POA: Insufficient documentation

## 2019-10-01 DIAGNOSIS — I4891 Unspecified atrial fibrillation: Secondary | ICD-10-CM | POA: Diagnosis not present

## 2019-10-01 DIAGNOSIS — I2694 Multiple subsegmental pulmonary emboli without acute cor pulmonale: Secondary | ICD-10-CM | POA: Diagnosis not present

## 2019-10-01 MED ORDER — METOPROLOL SUCCINATE ER 100 MG PO TB24: 100.0000 mg | ORAL_TABLET | Freq: Every day | ORAL | 3 refills | Status: DC

## 2019-10-01 NOTE — H&P (View-Only) (Signed)
Cardiology Office Note:    Date:  10/01/2019   ID:  Jeffrey Griffin, DOB [birth year], MRN [id number]  PCP:  Patient, No Pcp Per  Cardiologist:  Revankar, Brittan Butterbaugh  Electrophysiologist:  None   Referring MD: No ref. provider found   Chief Complaint  Patient presents with  . Atrial Fibrillation    Problem list 1.  Acute pulmonary embolism - Jan. 11, 2021  2.  Atrial fibrillation 3.   Ascending thoracic aortic aneurysm ( 4.3 cm)  4.  Acute systolic congestive heart failure-ejection fraction 20 to 25%.    History of Present Illness:    Jeffrey Griffin is a 59 y.o. male with a recent hospitalization at [facility] for atrial fibrillation.  He was originally seen down in [facility] but wanted to follow-up in [place].  His symptoms started sometime prior to Christmas.  He presented to the urgent care with 4 days of severe shortness of breath and orthopnea.  He was found to have atrial fibrillation.  CT angiogram of the chest showed acute subsegmental pulmonary embolism to the right upper and right lower lobes.  He also was found to have a 4.3 cm ascending aortic aneurysm and a 4 mm right lung nodule.  No stress test or cath performed.  No angina   Family hx of cardiac disease Mom has IHSS Father had CVAs  Brother had Afib / ablation Sister has HTN   Cannot tell that his HR is very iireg.    Breathing is better.    No leg trauma,  No long trips to explain his pulmonary embolus.    Feeling better.  A bit fatigued Used to drink coffee and has been avoiding it recently   Prior to hospitalization , did not follow any diet .  Takes a salad to work for lunch  . Breakfast is scrambled egg white, with veggies + chicken  Dinner is salad, veggies,   Eats suasage several times a week ,   Works out with a trainer several times a week .   Past Medical History:  Diagnosis Date  . A-fib (HCC)    WITH RVR  . Abnormality of thoracic aorta    4.3 CM ECTATIC ASENDING   .  Cardiopathy   . CHF (congestive heart failure) (HCC)    SYSTOLIC  . Hyperlipidemia   . Lung nodule   . Pleural effusion    RIGHT UPPER LOBE AND RIGHT LOWER LOBE     Past Surgical History:  Procedure Laterality Date  . APPENDECTOMY    . VASECTOMY      Current Medications: Current Meds  Medication Sig  . amiodarone (PACERONE) 200 MG tablet Take 200 mg by mouth 2 (two) times daily.   Marland Kitchen apixaban (ELIQUIS) 5 MG TABS tablet Take 5 mg by mouth 2 (two) times daily.  Marland Kitchen atorvastatin (LIPITOR) 10 MG tablet Take 10 mg by mouth daily.  . Cholecalciferol (VITAMIN D3) 25 MCG (1000 UT) CAPS Take by mouth.  Marland Kitchen glucosamine-chondroitin 500-400 MG tablet Take 1 tablet by mouth daily.  . Multiple Minerals-Vitamins (CAL MAG ZINC +D3 PO) Take by mouth.  . Multiple Vitamin (MULTIVITAMIN) tablet Take 1 tablet by mouth daily.  . [DISCONTINUED] METOPROLOL SUCCINATE PO Take 75 mg by mouth daily.      Allergies:   Patient has no known allergies.   Social History   Socioeconomic History  . Marital status: Single    Spouse name: Not on file  . Number of children: Not on file  .  Years of education: Not on file  . Highest education level: Not on file  Occupational History  . Not on file  Tobacco Use  . Smoking status: Never Smoker  . Smokeless tobacco: Never Used  Substance and Sexual Activity  . Alcohol use: Yes    Alcohol/week: 0.0 standard drinks  . Drug use: No  . Sexual activity: Not on file  Other Topics Concern  . Not on file  Social History Narrative  . Not on file   Social Determinants of Health   Financial Resource Strain:   . Difficulty of Paying Living Expenses: Not on file  Food Insecurity:   . Worried About Charity fundraiser in the Last Year: Not on file  . Ran Out of Food in the Last Year: Not on file  Transportation Needs:   . Lack of Transportation (Medical): Not on file  . Lack of Transportation (Non-Medical): Not on file  Physical Activity:   . Days of Exercise per  Week: Not on file  . Minutes of Exercise per Session: Not on file  Stress:   . Feeling of Stress : Not on file  Social Connections:   . Frequency of Communication with Friends and Family: Not on file  . Frequency of Social Gatherings with Friends and Family: Not on file  . Attends Religious Services: Not on file  . Active Member of Clubs or Organizations: Not on file  . Attends Archivist Meetings: Not on file  . Marital Status: Not on file     Family History: The patient's family history includes Atrial fibrillation in his brother; Hypertension in his father and sister; Hypertrophic cardiomyopathy in his mother; Stroke in his father.  ROS:     EKGs/Labs/Other Studies Reviewed:     EKG: October 01, 2019: Atrial fibrillation with a heart rate of 117.  Nonspecific ST and T wave changes.  Recent Labs: No results found for requested labs within last 8760 hours.  Recent Lipid Panel    Component Value Date/Time   CHOL 224 (H) 08/14/2015 1015   TRIG 140 08/14/2015 1015   HDL 42 08/14/2015 1015   CHOLHDL 5.3 (H) 08/14/2015 1015   VLDL 28 08/14/2015 1015   LDLCALC 154 (H) 08/14/2015 1015    Physical Exam:    VS:  BP 114/80   Pulse (!) 117   Ht 5\' 9"  (1.753 m)   Wt 243 lb (110.2 kg)   SpO2 97%   BMI 35.88 kg/m     Wt Readings from Last 3 Encounters:  10/01/19 243 lb (110.2 kg)  11/11/15 232 lb 6.4 oz (105.4 kg)  10/19/15 234 lb 9.6 oz (106.4 kg)     GEN: middle age male,  NAD  HEENT: Normal NECK: No JVD; No carotid bruits LYMPHATICS: No lymphadenopathy CARDIAC:  Irreg. Irreg.  RESPIRATORY:  Clear to auscultation without rales, wheezing or rhonchi  ABDOMEN: Soft, non-tender, non-distended MUSCULOSKELETAL:  No edema; No deformity  SKIN: Warm and dry NEUROLOGIC:  Alert and oriented x 3 PSYCHIATRIC:  Normal affect   ASSESSMENT:    1. Atrial fibrillation, unspecified type ([place])    PLAN:    In order of problems listed above:  1. Atrial fibrillation:  The patient presented to the hospital with multiple issues including atrial fibrillation, congestive heart failure, pulmonary embolus.  I think that we need to address his A. fib first.  He is already on Eliquis.  We will have him continue Eliquis and schedule a cardioversion on  February 15.  This will give him adequate time for anticoagulation for his A. fib and for his pulmonary embolus.  His rate is slightly elevated.  Will increase his Toprol-XL to 100 mg a day.  2.  Congestive heart failure: He has acutely diagnosed systolic congestive heart failure.  His ejection fraction is 20 to 25%.  This may be just rate related but I cannot exclude exclude coronary artery disease.  We will need to perform a Myoview study at some point.  I suggest that we try to cardiovert him back to normal rhythm before we do a Myoview study.  3.  Pulmonary embolus: The patient was also found to have a pulmonary embolus when he was admitted to the hospital.  He was treated with Eliquis 10 mg twice a day for 1 week and then reduce his Eliquis dose appropriately to 5 mg twice a day.  He has no history of long car rides or airplane rides and no history of leg injury.  I suggest that he may need lifelong anticoagulation.  4.  Hyperlipidemia: He is on atorvastatin.  Will follow up with that at future visits.    Medication Adjustments/Labs and Tests Ordered: Current medicines are reviewed at length with the patient today.  Concerns regarding medicines are outlined above.  Orders Placed This Encounter  Procedures  . Basic metabolic panel  . CBC  . EKG 12-Lead   Meds ordered this encounter  Medications  . metoprolol succinate (TOPROL-XL) 100 MG 24 hr tablet    Sig: Take 1 tablet (100 mg total) by mouth daily. Take with or immediately following a meal.    Dispense:  90 tablet    Refill:  3    Dose change    Patient Instructions  Medication Instructions:  1) INCREASE Metoprolol Succinate to 100mg  once daily  *If  you need a refill on your cardiac medications before your next appointment, please call your pharmacy*  Lab Work: BMET and CBC today  If you have labs (blood work) drawn today and your tests are completely normal, you will receive your results only by: Marland Kitchen MyChart Message (if you have MyChart) OR . A paper copy in the mail If you have any lab test that is abnormal or we need to change your treatment, we will call you to review the results.  Testing/Procedures: Your physician has recommended that you have a Cardioversion (DCCV). Electrical Cardioversion uses a jolt of electricity to your heart either through paddles or wired patches attached to your chest. This is a controlled, usually prescheduled, procedure. Defibrillation is done under light anesthesia in the hospital, and you usually go home the day of the procedure. This is done to get your heart back into a normal rhythm. You are not awake for the procedure. Please see the instruction sheet given to you today.   Follow-Up: At [facility], you and your health needs are our priority.  As part of our continuing mission to provide you with exceptional heart care, we have created designated Provider Care Teams.  These Care Teams include your primary Cardiologist (physician) and Advanced Practice Providers (APPs -  Physician Assistants and Nurse Practitioners) who all work together to provide you with the care you need, when you need it.  Your next appointment:   6-8 week(s)  The format for your next appointment:   In Person  Provider:   You may see Dr. [name] or one of the following Advanced Practice Providers on your designated Care  Team:    Richardson Dopp, PA-C  Vin [place], PA-C  Daune Perch, NP   Other Instructions      Signed, [name], MD  10/01/2019 5:44 PM    [place]

## 2019-10-01 NOTE — Progress Notes (Signed)
Cardiology Office Note:    Date:  10/01/2019   ID:  Jeffrey Griffin, DOB 15-May-1961, MRN ID:[id number]  PCP:  Patient, No Pcp Per  Cardiologist:  Revankar, Maedell Hedger  Electrophysiologist:  None   Referring MD: No ref. provider found   Chief Complaint  Patient presents with  . Atrial Fibrillation    Problem list 1.  Acute pulmonary embolism - Jan. 11, 2021  2.  Atrial fibrillation 3.   Ascending thoracic aortic aneurysm ( 4.3 cm)  4.  Acute systolic congestive heart failure-ejection fraction 20 to 25%.    History of Present Illness:    Jeffrey Griffin is a 59 y.o. male with a recent hospitalization at [facility] [place] for atrial fibrillation.  He was originally seen down in [facility] but wanted to follow-up in [place].  His symptoms started sometime prior to Christmas.  He presented to the urgent care with 4 days of severe shortness of breath and orthopnea.  He was found to have atrial fibrillation.  CT angiogram of the chest showed acute subsegmental pulmonary embolism to the right upper and right lower lobes.  He also was found to have a 4.3 cm ascending aortic aneurysm and a 4 mm right lung nodule.  No stress test or cath performed.  No angina   Family hx of cardiac disease Mom has IHSS Father had CVAs  Brother had Afib / ablation Sister has HTN   Cannot tell that his HR is very iireg.    Breathing is better.    No leg trauma,  No long trips to explain his pulmonary embolus.    Feeling better.  A bit fatigued Used to drink coffee and has been avoiding it recently   Prior to hospitalization , did not follow any diet .  Takes a salad to work for lunch  . Breakfast is scrambled egg white, with veggies + chicken  Dinner is salad, veggies,   Eats suasage several times a week ,   Works out with a trainer several times a week .   Past Medical History:  Diagnosis Date  . A-fib (HCC)    WITH RVR  . Abnormality of thoracic aorta    4.3 CM ECTATIC ASENDING   .  Cardiopathy   . CHF (congestive heart failure) (HCC)    SYSTOLIC  . Hyperlipidemia   . Lung nodule   . Pleural effusion    RIGHT UPPER LOBE AND RIGHT LOWER LOBE     Past Surgical History:  Procedure Laterality Date  . APPENDECTOMY    . VASECTOMY      Current Medications: Current Meds  Medication Sig  . amiodarone (PACERONE) 200 MG tablet Take 200 mg by mouth 2 (two) times daily.   Marland Kitchen apixaban (ELIQUIS) 5 MG TABS tablet Take 5 mg by mouth 2 (two) times daily.  Marland Kitchen atorvastatin (LIPITOR) 10 MG tablet Take 10 mg by mouth daily.  . Cholecalciferol (VITAMIN D3) 25 MCG (1000 UT) CAPS Take by mouth.  Marland Kitchen glucosamine-chondroitin 500-400 MG tablet Take 1 tablet by mouth daily.  . Multiple Minerals-Vitamins (CAL MAG ZINC +D3 PO) Take by mouth.  . Multiple Vitamin (MULTIVITAMIN) tablet Take 1 tablet by mouth daily.  . [DISCONTINUED] METOPROLOL SUCCINATE PO Take 75 mg by mouth daily.      Allergies:   Patient has no known allergies.   Social History   Socioeconomic History  . Marital status: Single    Spouse name: Not on file  . Number of children: Not on file  .  Years of education: Not on file  . Highest education level: Not on file  Occupational History  . Not on file  Tobacco Use  . Smoking status: Never Smoker  . Smokeless tobacco: Never Used  Substance and Sexual Activity  . Alcohol use: Yes    Alcohol/week: 0.0 standard drinks  . Drug use: No  . Sexual activity: Not on file  Other Topics Concern  . Not on file  Social History Narrative  . Not on file   Social Determinants of Health   Financial Resource Strain:   . Difficulty of Paying Living Expenses: Not on file  Food Insecurity:   . Worried About Charity fundraiser in the Last Year: Not on file  . Ran Out of Food in the Last Year: Not on file  Transportation Needs:   . Lack of Transportation (Medical): Not on file  . Lack of Transportation (Non-Medical): Not on file  Physical Activity:   . Days of Exercise per  Week: Not on file  . Minutes of Exercise per Session: Not on file  Stress:   . Feeling of Stress : Not on file  Social Connections:   . Frequency of Communication with Friends and Family: Not on file  . Frequency of Social Gatherings with Friends and Family: Not on file  . Attends Religious Services: Not on file  . Active Member of Clubs or Organizations: Not on file  . Attends Archivist Meetings: Not on file  . Marital Status: Not on file     Family History: The patient's family history includes Atrial fibrillation in his brother; Hypertension in his father and sister; Hypertrophic cardiomyopathy in his mother; Stroke in his father.  ROS:     EKGs/Labs/Other Studies Reviewed:     EKG: October 01, 2019: Atrial fibrillation with a heart rate of 117.  Nonspecific ST and T wave changes.  Recent Labs: No results found for requested labs within last 8760 hours.  Recent Lipid Panel    Component Value Date/Time   CHOL 224 (H) 08/14/2015 1015   TRIG 140 08/14/2015 1015   HDL 42 08/14/2015 1015   CHOLHDL 5.3 (H) 08/14/2015 1015   VLDL 28 08/14/2015 1015   LDLCALC 154 (H) 08/14/2015 1015    Physical Exam:    VS:  BP 114/80   Pulse (!) 117   Ht 5\' 9"  (1.753 m)   Wt 243 lb (110.2 kg)   SpO2 97%   BMI 35.88 kg/m     Wt Readings from Last 3 Encounters:  10/01/19 243 lb (110.2 kg)  11/11/15 232 lb 6.4 oz (105.4 kg)  10/19/15 234 lb 9.6 oz (106.4 kg)     GEN: middle age male,  NAD  HEENT: Normal NECK: No JVD; No carotid bruits LYMPHATICS: No lymphadenopathy CARDIAC:  Irreg. Irreg.  RESPIRATORY:  Clear to auscultation without rales, wheezing or rhonchi  ABDOMEN: Soft, non-tender, non-distended MUSCULOSKELETAL:  No edema; No deformity  SKIN: Warm and dry NEUROLOGIC:  Alert and oriented x 3 PSYCHIATRIC:  Normal affect   ASSESSMENT:    1. Atrial fibrillation, unspecified type ([place])    PLAN:    In order of problems listed above:  1. Atrial fibrillation:  The patient presented to the hospital with multiple issues including atrial fibrillation, congestive heart failure, pulmonary embolus.  I think that we need to address his A. fib first.  He is already on Eliquis.  We will have him continue Eliquis and schedule a cardioversion on  February 15.  This will give him adequate time for anticoagulation for his A. fib and for his pulmonary embolus.  His rate is slightly elevated.  Will increase his Toprol-XL to 100 mg a day.  2.  Congestive heart failure: He has acutely diagnosed systolic congestive heart failure.  His ejection fraction is 20 to 25%.  This may be just rate related but I cannot exclude exclude coronary artery disease.  We will need to perform a Myoview study at some point.  I suggest that we try to cardiovert him back to normal rhythm before we do a Myoview study.  3.  Pulmonary embolus: The patient was also found to have a pulmonary embolus when he was admitted to the hospital.  He was treated with Eliquis 10 mg twice a day for 1 week and then reduce his Eliquis dose appropriately to 5 mg twice a day.  He has no history of long car rides or airplane rides and no history of leg injury.  I suggest that he may need lifelong anticoagulation.  4.  Hyperlipidemia: He is on atorvastatin.  Will follow up with that at future visits.    Medication Adjustments/Labs and Tests Ordered: Current medicines are reviewed at length with the patient today.  Concerns regarding medicines are outlined above.  Orders Placed This Encounter  Procedures  . Basic metabolic panel  . CBC  . EKG 12-Lead   Meds ordered this encounter  Medications  . metoprolol succinate (TOPROL-XL) 100 MG 24 hr tablet    Sig: Take 1 tablet (100 mg total) by mouth daily. Take with or immediately following a meal.    Dispense:  90 tablet    Refill:  3    Dose change    Patient Instructions  Medication Instructions:  1) INCREASE Metoprolol Succinate to 100mg  once daily  *If  you need a refill on your cardiac medications before your next appointment, please call your pharmacy*  Lab Work: BMET and CBC today  If you have labs (blood work) drawn today and your tests are completely normal, you will receive your results only by: Marland Kitchen MyChart Message (if you have MyChart) OR . A paper copy in the mail If you have any lab test that is abnormal or we need to change your treatment, we will call you to review the results.  Testing/Procedures: Your physician has recommended that you have a Cardioversion (DCCV). Electrical Cardioversion uses a jolt of electricity to your heart either through paddles or wired patches attached to your chest. This is a controlled, usually prescheduled, procedure. Defibrillation is done under light anesthesia in the hospital, and you usually go home the day of the procedure. This is done to get your heart back into a normal rhythm. You are not awake for the procedure. Please see the instruction sheet given to you today.   Follow-Up: At [facility], you and your health needs are our priority.  As part of our continuing mission to provide you with exceptional heart care, we have created designated Provider Care Teams.  These Care Teams include your primary Cardiologist (physician) and Advanced Practice Providers (APPs -  Physician Assistants and Nurse Practitioners) who all work together to provide you with the care you need, when you need it.  Your next appointment:   6-8 week(s)  The format for your next appointment:   In Person  Provider:   You may see Dr. [name] or one of the following Advanced Practice Providers on your designated Care  Team:    Richardson Dopp, PA-C  Vin [place], PA-C  Daune Perch, NP   Other Instructions      Signed, [name], MD  10/01/2019 5:44 PM    [place]

## 2019-10-01 NOTE — Patient Instructions (Signed)
Medication Instructions:  1) INCREASE Metoprolol Succinate to 100mg  once daily  *If you need a refill on your cardiac medications before your next appointment, please call your pharmacy*  Lab Work: BMET and CBC today  If you have labs (blood work) drawn today and your tests are completely normal, you will receive your results only by: Marland Kitchen MyChart Message (if you have MyChart) OR . A paper copy in the mail If you have any lab test that is abnormal or we need to change your treatment, we will call you to review the results.  Testing/Procedures: Your physician has recommended that you have a Cardioversion (DCCV). Electrical Cardioversion uses a jolt of electricity to your heart either through paddles or wired patches attached to your chest. This is a controlled, usually prescheduled, procedure. Defibrillation is done under light anesthesia in the hospital, and you usually go home the day of the procedure. This is done to get your heart back into a normal rhythm. You are not awake for the procedure. Please see the instruction sheet given to you today.   Follow-Up: At [facility], you and your health needs are our priority.  As part of our continuing mission to provide you with exceptional heart care, we have created designated Provider Care Teams.  These Care Teams include your primary Cardiologist (physician) and Advanced Practice Providers (APPs -  Physician Assistants and Nurse Practitioners) who all work together to provide you with the care you need, when you need it.  Your next appointment:   6-8 week(s)  The format for your next appointment:   In Person  Provider:   You may see Dr. [name] or one of the following Advanced Practice Providers on your designated Care Team:    Richardson Dopp, PA-C  [place], [place]  Daune Perch, NP   Other Instructions

## 2019-10-02 LAB — BASIC METABOLIC PANEL
BUN/Creatinine Ratio: 16 (ref 9–20)
BUN: 19 mg/dL (ref 6–24)
CO2: 21 mmol/L (ref 20–29)
Calcium: 10.6 mg/dL — ABNORMAL HIGH (ref 8.7–10.2)
Chloride: 107 mmol/L — ABNORMAL HIGH (ref 96–106)
Creatinine, Ser: 1.22 mg/dL (ref 0.76–1.27)
GFR calc Af Amer: 76 mL/min/{1.73_m2} (ref >59)
GFR calc non Af Amer: 66 mL/min/{1.73_m2} (ref >59)
Glucose: 100 mg/dL — ABNORMAL HIGH (ref 65–99)
Potassium: 4.9 mmol/L (ref 3.5–5.2)
Sodium: 141 mmol/L (ref 134–144)

## 2019-10-02 LAB — CBC
Hematocrit: 49.5 % (ref 37.5–51.0)
Hemoglobin: 16.3 g/dL (ref 13.0–17.7)
MCH: 29.5 pg (ref 26.6–33.0)
MCHC: 32.9 g/dL (ref 31.5–35.7)
MCV: 90 fL (ref 79–97)
Platelets: 243 10*3/uL (ref 150–450)
RBC: 5.52 x10E6/uL (ref 4.14–5.80)
RDW: 12.2 % (ref 11.6–15.4)
WBC: 7.2 10*3/uL (ref 3.4–10.8)

## 2019-10-10 ENCOUNTER — Other Ambulatory Visit: Payer: Self-pay | Admitting: Cardiovascular Disease

## 2019-10-10 MED ORDER — ATORVASTATIN CALCIUM 10 MG PO TABS: 10.0000 mg | ORAL_TABLET | Freq: Every day | ORAL | 3 refills | Status: DC

## 2019-10-10 MED ORDER — AMIODARONE HCL 200 MG PO TABS: 200.0000 mg | ORAL_TABLET | Freq: Two times a day (BID) | ORAL | 3 refills | Status: DC

## 2019-10-10 MED ORDER — APIXABAN 5 MG PO TABS: 5.0000 mg | ORAL_TABLET | Freq: Two times a day (BID) | ORAL | 5 refills | Status: DC

## 2019-10-10 NOTE — Telephone Encounter (Signed)
Prescription refill request for Eliquis received.  Last office visit: 10/01/2019, Nahser Scr: 1.22, 10/01/2019 Age: 59 yo Weight: 110.2 kg   Prescription refill sent.

## 2019-10-17 NOTE — Progress Notes (Signed)
Attempted pre- op call for endo procedure on Monday 2/15, no answer- left VM.

## 2019-10-18 ENCOUNTER — Other Ambulatory Visit (HOSPITAL_COMMUNITY)
Admission: RE | Admit: 2019-10-18 | Discharge: 2019-10-18 | Disposition: A | Discharge status: Home / Self Care | Payer: 59 | Source: Ambulatory Visit | Attending: Cardiovascular Disease | Admitting: Cardiovascular Disease

## 2019-10-18 DIAGNOSIS — Z01812 Encounter for preprocedural laboratory examination: Secondary | ICD-10-CM | POA: Diagnosis present

## 2019-10-18 DIAGNOSIS — Z20822 Contact with and (suspected) exposure to covid-19: Secondary | ICD-10-CM | POA: Insufficient documentation

## 2019-10-18 LAB — SARS CORONAVIRUS 2 (TAT 6-24 HRS): SARS Coronavirus 2: NEGATIVE

## 2019-10-19 ENCOUNTER — Other Ambulatory Visit: Payer: Self-pay | Admitting: Cardiovascular Disease

## 2019-10-20 ENCOUNTER — Ambulatory Visit (HOSPITAL_COMMUNITY): Payer: 59

## 2019-10-20 ENCOUNTER — Ambulatory Visit (HOSPITAL_COMMUNITY)
Admission: RE | Admit: 2019-10-20 | Discharge: 2019-10-20 | Disposition: A | Discharge status: Home / Self Care | Payer: 59 | Attending: Cardiovascular Disease | Admitting: Cardiovascular Disease

## 2019-10-20 ENCOUNTER — Encounter (HOSPITAL_COMMUNITY): Payer: Self-pay | Admitting: Cardiovascular Disease

## 2019-10-20 ENCOUNTER — Other Ambulatory Visit: Payer: Self-pay

## 2019-10-20 ENCOUNTER — Encounter (HOSPITAL_COMMUNITY)
Admission: RE | Disposition: A | Discharge status: Home / Self Care | Payer: Self-pay | Source: Home / Self Care | Attending: Cardiovascular Disease

## 2019-10-20 DIAGNOSIS — I4891 Unspecified atrial fibrillation: Secondary | ICD-10-CM | POA: Diagnosis not present

## 2019-10-20 DIAGNOSIS — I712 Thoracic aortic aneurysm, without rupture: Secondary | ICD-10-CM | POA: Insufficient documentation

## 2019-10-20 DIAGNOSIS — I5021 Acute systolic (congestive) heart failure: Secondary | ICD-10-CM | POA: Insufficient documentation

## 2019-10-20 DIAGNOSIS — Z8249 Family history of ischemic heart disease and other diseases of the circulatory system: Secondary | ICD-10-CM | POA: Diagnosis not present

## 2019-10-20 DIAGNOSIS — Z79899 Other long term (current) drug therapy: Secondary | ICD-10-CM | POA: Diagnosis not present

## 2019-10-20 DIAGNOSIS — I4819 Other persistent atrial fibrillation: Secondary | ICD-10-CM

## 2019-10-20 DIAGNOSIS — I2699 Other pulmonary embolism without acute cor pulmonale: Secondary | ICD-10-CM | POA: Diagnosis not present

## 2019-10-20 DIAGNOSIS — Z7901 Long term (current) use of anticoagulants: Secondary | ICD-10-CM | POA: Insufficient documentation

## 2019-10-20 DIAGNOSIS — E785 Hyperlipidemia, unspecified: Secondary | ICD-10-CM | POA: Diagnosis not present

## 2019-10-20 HISTORY — PX: CARDIOVERSION: SHX1299

## 2019-10-20 LAB — POCT I-STAT, CHEM 8
BUN: 20 mg/dL (ref 6–20)
Calcium, Ion: 1.38 mmol/L (ref 1.15–1.40)
Chloride: 106 mmol/L (ref 98–111)
Creatinine, Ser: 0.8 mg/dL (ref 0.61–1.24)
Glucose, Bld: 113 mg/dL — ABNORMAL HIGH (ref 70–99)
HCT: 53 % — ABNORMAL HIGH (ref 39.0–52.0)
Hemoglobin: 18 g/dL — ABNORMAL HIGH (ref 13.0–17.0)
Potassium: 4.4 mmol/L (ref 3.5–5.1)
Sodium: 139 mmol/L (ref 135–145)
TCO2: 25 mmol/L (ref 22–32)

## 2019-10-20 SURGERY — CARDIOVERSION
Anesthesia: General

## 2019-10-20 MED ORDER — AMIODARONE HCL 200 MG PO TABS: 200.0000 mg | ORAL_TABLET | Freq: Every day | ORAL | 3 refills | Status: DC

## 2019-10-20 MED ORDER — SODIUM CHLORIDE 0.9 % IV SOLN: INTRAVENOUS | Status: DC | PRN

## 2019-10-20 MED ORDER — LIDOCAINE 2% (20 MG/ML) 5 ML SYRINGE
INTRAMUSCULAR | Status: DC | PRN
  Administered 2019-10-20: 20 mg via INTRAVENOUS

## 2019-10-20 MED ORDER — PROPOFOL 10 MG/ML IV BOLUS
INTRAVENOUS | Status: DC | PRN
  Administered 2019-10-20: 100 mg via INTRAVENOUS

## 2019-10-20 NOTE — CV Procedure (Signed)
    Cardioversion Note  Darion Breed [id number] Jul 02, 1961  Procedure: DC Cardioversion Indications: Atrial fib   Procedure Details Consent: Obtained Time Out: Verified patient identification, verified procedure, site/side was marked, verified correct patient position, special equipment/implants available, Radiology Safety Procedures followed,  medications/allergies/relevent history reviewed, required imaging and test results available.  Performed  The patient has been on adequate anticoagulation.  The patient received IV Lidocaine 20 mg IV followed by Propofol 100 mg IV  for sedation.  Synchronous cardioversion was performed at  200  joules.  The cardioversion was  Successful.     Complications: No apparent complications Patient did tolerate procedure well.   Thayer Headings, Brooke Bonito., MD, [facility] 10/20/2019, 10:10 AM

## 2019-10-20 NOTE — Anesthesia Postprocedure Evaluation (Signed)
Anesthesia Post Note  Patient: Damyan Cost  Procedure(s) Performed: CARDIOVERSION (N/A )     Patient location during evaluation: Endoscopy Anesthesia Type: General Level of consciousness: awake and alert, patient cooperative and oriented Pain management: pain level controlled Vital Signs Assessment: post-procedure vital signs reviewed and stable Respiratory status: spontaneous breathing, nonlabored ventilation and respiratory function stable Cardiovascular status: blood pressure returned to baseline and stable Postop Assessment: no apparent nausea or vomiting Anesthetic complications: no    Last Vitals:  Vitals:   10/20/19 1000 10/20/19 1010  BP: 123/82 130/90  Pulse: (!) 57 (!) 55  Resp: 17 18  Temp:    SpO2: 97% 99%    Last Pain:  Vitals:   10/20/19 1000  TempSrc:   PainSc: 0-No pain                 Shequita Peplinski,E. Khayri Kargbo

## 2019-10-20 NOTE — Discharge Instructions (Signed)
Electrical Cardioversion   What can I expect after the procedure?  Your blood pressure, heart rate, breathing rate, and blood oxygen level will be monitored until you leave the hospital or clinic.  Your heart rhythm will be watched to make sure it does not change.  You may have some redness on the skin where the shocks were given. Follow these instructions at home:  Do not drive for 24 hours if you were given a sedative during your procedure.  Take over-the-counter and prescription medicines only as told by your health care provider.  Ask your health care provider how to check your pulse. Check it often.  Rest for 48 hours after the procedure or as told by your health care provider.  Avoid or limit your caffeine use as told by your health care provider.  Keep all follow-up visits as told by your health care provider. This is important. Contact a health care provider if:  You feel like your heart is beating too quickly or your pulse is not regular.  You have a serious muscle cramp that does not go away. Get help right away if:  You have discomfort in your chest.  You are dizzy or you feel faint.  You have trouble breathing or you are short of breath.  Your speech is slurred.  You have trouble moving an arm or leg on one side of your body.  Your fingers or toes turn cold or blue. Summary  Electrical cardioversion is the delivery of a jolt of electricity to restore a normal rhythm to the heart.  This procedure may be done right away in an emergency or may be a scheduled procedure if the condition is not an emergency.  Generally, this is a safe procedure.  After the procedure, check your pulse often as told by your health care provider. This information is not intended to replace advice given to you by your health care provider. Make sure you discuss any questions you have with your health care provider. Document Revised: 03/24/2019 Document Reviewed:  03/24/2019 Elsevier Patient Education  2020 Elsevier Inc.  

## 2019-10-20 NOTE — Transfer of Care (Signed)
Immediate Anesthesia Transfer of Care Note  Patient: Jeffrey Griffin  Procedure(s) Performed: CARDIOVERSION (N/A )  Patient Location: Endoscopy Unit  Anesthesia Type:MAC  Level of Consciousness: awake, alert  and oriented  Airway & Oxygen Therapy: Patient Spontanous Breathing and Patient connected to nasal cannula oxygen  Post-op Assessment: Report given to RN, Post -op Vital signs reviewed and stable and Patient moving all extremities  Post vital signs: Reviewed and stable  Last Vitals:  Vitals Value Taken Time  BP    Temp    Pulse    Resp    SpO2      Last Pain:  Vitals:   10/20/19 0828  TempSrc: Oral  PainSc: 0-No pain         Complications: No apparent anesthesia complications

## 2019-10-20 NOTE — Anesthesia Procedure Notes (Signed)
Procedure Name: MAC Date/Time: 10/20/2019 9:30 AM Performed by: Amadeo Garnet, CRNA Pre-anesthesia Checklist: Patient identified, Emergency Drugs available, Suction available and Patient being monitored Patient Re-evaluated:Patient Re-evaluated prior to induction Oxygen Delivery Method: Ambu bag Preoxygenation: Pre-oxygenation with 100% oxygen Induction Type: IV induction Placement Confirmation: positive ETCO2 Dental Injury: Teeth and Oropharynx as per pre-operative assessment

## 2019-10-20 NOTE — Anesthesia Preprocedure Evaluation (Addendum)
Anesthesia Evaluation  Patient identified by MRN, date of birth, ID band Patient awake    Reviewed: Allergy & Precautions, NPO status , Patient's Chart, lab work & pertinent test results, reviewed documented beta blocker date and time   History of Anesthesia Complications Negative for: history of anesthetic complications  Airway Mallampati: II  TM Distance: >3 FB Neck ROM: Full    Dental  (+) Dental Advisory Given   Pulmonary sleep apnea (does not use CPAP) ,  10/18/2019 SARS coronavirus NEG   breath sounds clear to auscultation       Cardiovascular hypertension, Pt. on medications and Pt. on home beta blockers (-) angina+ Peripheral Vascular Disease (asc aortic aneurysm) and +CHF  + dysrhythmias Atrial Fibrillation  Rhythm:Irregular Rate:Normal  09/2019 ECHO: EF 20-25%   Neuro/Psych negative neurological ROS     GI/Hepatic negative GI ROS, Neg liver ROS,   Endo/Other  Morbid obesity  Renal/GU negative Renal ROS     Musculoskeletal   Abdominal (+) + obese,   Peds  Hematology eliquis   Anesthesia Other Findings   Reproductive/Obstetrics                            Anesthesia Physical Anesthesia Plan  ASA: III  Anesthesia Plan: General   Post-op Pain Management:    Induction: Intravenous  PONV Risk Score and Plan: 2 and Treatment may vary due to age or medical condition  Airway Management Planned: Natural Airway and Mask  Additional Equipment:   Intra-op Plan:   Post-operative Plan:   Informed Consent: I have reviewed the patients History and Physical, chart, labs and discussed the procedure including the risks, benefits and alternatives for the proposed anesthesia with the patient or authorized representative who has indicated his/her understanding and acceptance.     Dental advisory given  Plan Discussed with: CRNA and Surgeon  Anesthesia Plan Comments:         Anesthesia Quick Evaluation

## 2019-10-21 NOTE — Interval H&P Note (Signed)
History and Physical Interval Note:  10/21/2019 6:22 AM  Jeffrey Griffin  has presented today for surgery, with the diagnosis of AFIB.  The various methods of treatment have been discussed with the patient and family. After consideration of risks, benefits and other options for treatment, the patient has consented to  Procedure(s): CARDIOVERSION (N/A) as a surgical intervention.  The patient's history has been reviewed, patient examined, no change in status, stable for surgery.  I have reviewed the patient's chart and labs.  Questions were answered to the patient's satisfaction.     [name]

## 2019-11-10 ENCOUNTER — Encounter: Payer: Self-pay | Admitting: Nurse Practitioner

## 2019-11-10 ENCOUNTER — Other Ambulatory Visit: Payer: Self-pay

## 2019-11-10 ENCOUNTER — Encounter (INDEPENDENT_AMBULATORY_CARE_PROVIDER_SITE_OTHER): Payer: Self-pay

## 2019-11-10 ENCOUNTER — Encounter: Payer: Self-pay | Admitting: Cardiovascular Disease

## 2019-11-10 ENCOUNTER — Ambulatory Visit (INDEPENDENT_AMBULATORY_CARE_PROVIDER_SITE_OTHER): Payer: 59 | Admitting: Cardiovascular Disease

## 2019-11-10 VITALS — BP 182/90 | HR 52 | Ht 69.0 in | Wt 245.5 lb

## 2019-11-10 DIAGNOSIS — I712 Thoracic aortic aneurysm, without rupture, unspecified: Secondary | ICD-10-CM

## 2019-11-10 DIAGNOSIS — I4819 Other persistent atrial fibrillation: Secondary | ICD-10-CM | POA: Diagnosis not present

## 2019-11-10 DIAGNOSIS — I5042 Chronic combined systolic (congestive) and diastolic (congestive) heart failure: Secondary | ICD-10-CM

## 2019-11-10 DIAGNOSIS — I5023 Acute on chronic systolic (congestive) heart failure: Secondary | ICD-10-CM | POA: Diagnosis not present

## 2019-11-10 MED ORDER — AMIODARONE HCL 200 MG PO TABS: 200.0000 mg | ORAL_TABLET | ORAL | 3 refills | Status: DC

## 2019-11-10 MED ORDER — ENTRESTO 24-26 MG PO TABS: 1.0000 | ORAL_TABLET | Freq: Two times a day (BID) | ORAL | 11 refills | Status: DC

## 2019-11-10 NOTE — Patient Instructions (Signed)
Medication Instructions:  Your physician has recommended you make the following change in your medication:  DECREASE Amiodarone to 200 mg three times per week (M W F) or whatever schedule works for you START Entresto (Valsartan/Sacubitril) 24-26 mg twice daily  *If you need a refill on your cardiac medications before your next appointment, please call your pharmacy*   Lab Work: Your physician recommends that you return for lab work in: at your next visit for [place]  If you have labs (blood work) drawn today and your tests are completely normal, you will receive your results only by: Marland Kitchen MyChart Message (if you have MyChart) OR . A paper copy in the mail If you have any lab test that is abnormal or we need to change your treatment, we will call you to review the results.   Testing/Procedures: Your physician has requested that you have an echocardiogram. Echocardiography is a painless test that uses sound waves to create images of your heart. It provides your doctor with information about the size and shape of your heart and how well your heart's chambers and valves are working. This procedure takes approximately one hour. There are no restrictions for this procedure.  Your physician has requested that you have a lexiscan myoview. For further information please visit [email or web address]. Please follow instruction sheet, as given.    Follow-Up: At [facility], you and your health needs are our priority.  As part of our continuing mission to provide you with exceptional heart care, we have created designated Provider Care Teams.  These Care Teams include your primary Cardiologist (physician) and Advanced Practice Providers (APPs -  Physician Assistants and Nurse Practitioners) who all work together to provide you with the care you need, when you need it.  We recommend signing up for the patient portal called "MyChart".  Sign up information is provided on this After Visit  Summary.  MyChart is used to connect with patients for Virtual Visits (Telemedicine).  Patients are able to view lab/test results, encounter notes, upcoming appointments, etc.  Non-urgent messages can be sent to your provider as well.   To learn more about what you can do with MyChart, go to [email or web address].    Your next appointment:   4 week(s)  The format for your next appointment:   In Person  Provider:   You may see [name], MD or one of the following Advanced Practice Providers on your designated Care Team:    Richardson Dopp, PA-C  [place], [place]  Daune Perch, [place]

## 2019-11-10 NOTE — Progress Notes (Signed)
Cardiology Office Note:    Date:  11/10/2019   ID:  Jeffrey Griffin, DOB [birth year], MRN ID:[id number]  PCP:  Patient, No Pcp Per  Cardiologist:  Revankar, Tipton Ballow  Electrophysiologist:  None   Referring MD: No ref. provider found   Chief Complaint  Patient presents with   Atrial Fibrillation   Hypertension    Problem list 1.  Acute pulmonary embolism - Jan. 11, 2021  2.  Atrial fibrillation 3.   Ascending thoracic aortic aneurysm ( 4.3 cm)  4.  Acute systolic congestive heart failure-ejection fraction 20 to 25%.    History of Present Illness:    Jeffrey Griffin is a 59 y.o. male with a recent hospitalization at [facility] for atrial fibrillation.  He was originally seen down in [facility] but wanted to follow-up in [place].  His symptoms started sometime prior to Christmas.  He presented to the urgent care with 4 days of severe shortness of breath and orthopnea.  He was found to have atrial fibrillation.  CT angiogram of the chest showed acute subsegmental pulmonary embolism to the right upper and right lower lobes.  He also was found to have a 4.3 cm ascending aortic aneurysm and a 4 mm right lung nodule.  No stress test or cath performed.  No angina   Family hx of cardiac disease Mom has IHSS Father had CVAs  Brother had Afib / ablation Sister has HTN   Cannot tell that his HR is very iireg.    Breathing is better.    No leg trauma,  No long trips to explain his pulmonary embolus.    Feeling better.  A bit fatigued Used to drink coffee and has been avoiding it recently   Prior to hospitalization , did not follow any diet .  Takes a salad to work for lunch  . Breakfast is scrambled egg white, with veggies + chicken  Dinner is salad, veggies,   Eats suasage several times a week ,   Works out with a trainer several times a week .   November 10, 2019: Jeffrey Griffin is seen back today for follow-up of his atrial fibrillation, congestive heart failure, and  pulmonary embolus, and 4.3 cm ascending aortic aneurysm.  He had a successful cardioversion on October 20, 2019. Feeling much better after cardioversion Still has some episodes of dyspnea.  Has gained weight this past week .   Has been trying to avoid salty foods.  No CP. No palpitations   Past Medical History:  Diagnosis Date   A-fib ([place])    WITH RVR   Abnormality of thoracic aorta    4.3 CM ECTATIC ASENDING    Cardiopathy    CHF (congestive heart failure) (HCC)    SYSTOLIC   Hyperlipidemia    Lung nodule    Pleural effusion    RIGHT UPPER LOBE AND RIGHT LOWER LOBE     Past Surgical History:  Procedure Laterality Date   APPENDECTOMY     CARDIOVERSION N/A 10/20/2019   Procedure: CARDIOVERSION;  Surgeon: Thayer Headings, MD;  Location: [facility];  Service: Cardiovascular;  Laterality: N/A;   VASECTOMY      Current Medications: Current Meds  Medication Sig   apixaban (ELIQUIS) 5 MG TABS tablet Take 1 tablet (5 mg total) by mouth 2 (two) times daily.   ascorbic acid (CVS VITAMIN C) 500 MG tablet Take 500 mg by mouth daily.   atorvastatin (LIPITOR) 10 MG tablet Take 1 tablet (10 mg total) by mouth  daily.   Cholecalciferol (VITAMIN D3) 25 MCG (1000 UT) CAPS Take 1,000 Units by mouth daily.    Glucosamine-Chondroitin (COSAMIN DS PO) Take 2 tablets by mouth daily.   metoprolol succinate (TOPROL-XL) 100 MG 24 hr tablet Take 1 tablet (100 mg total) by mouth daily. Take with or immediately following a meal.   Multiple Minerals-Vitamins (CAL MAG ZINC +D3 PO) Take 1 tablet by mouth daily.    Multiple Vitamin (MULTIVITAMIN WITH MINERALS) TABS tablet Take 1 tablet by mouth daily.   [DISCONTINUED] amiodarone (PACERONE) 200 MG tablet Take 1 tablet (200 mg total) by mouth daily.     Allergies:   Patient has no known allergies.   Social History   Socioeconomic History   Marital status: Married    Spouse name: Not on file   Number of children: Not on file    Years of education: Not on file   Highest education level: Not on file  Occupational History   Not on file  Tobacco Use   Smoking status: Never Smoker   Smokeless tobacco: Never Used  Substance and Sexual Activity   Alcohol use: Yes    Alcohol/week: 0.0 standard drinks   Drug use: No   Sexual activity: Not on file  Other Topics Concern   Not on file  Social History Narrative   Not on file   Social Determinants of Health   Financial Resource Strain:    Difficulty of Paying Living Expenses: Not on file  Food Insecurity:    Worried About Rooks in the Last Year: Not on file   Ran Out of Food in the Last Year: Not on file  Transportation Needs:    Lack of Transportation (Medical): Not on file   Lack of Transportation (Non-Medical): Not on file  Physical Activity:    Days of Exercise per Week: Not on file   Minutes of Exercise per Session: Not on file  Stress:    Feeling of Stress : Not on file  Social Connections:    Frequency of Communication with Friends and Family: Not on file   Frequency of Social Gatherings with Friends and Family: Not on file   Attends Religious Services: Not on file   Active Member of Clubs or Organizations: Not on file   Attends Archivist Meetings: Not on file   Marital Status: Not on file     Family History: The patient's family history includes Atrial fibrillation in his brother; Hypertension in his father and sister; Hypertrophic cardiomyopathy in his mother; Stroke in his father.  ROS:     EKGs/Labs/Other Studies Reviewed:     EKG:    Recent Labs: 10/01/2019: Platelets 243 10/20/2019: BUN 20; Creatinine, Ser 0.80; Hemoglobin 18.0; Potassium 4.4; Sodium 139  Recent Lipid Panel    Component Value Date/Time   CHOL 224 (H) 08/14/2015 1015   TRIG 140 08/14/2015 1015   HDL 42 08/14/2015 1015   CHOLHDL 5.3 (H) 08/14/2015 1015   VLDL 28 08/14/2015 1015   LDLCALC 154 (H) 08/14/2015 1015     Physical Exam:    Physical Exam: Blood pressure (!) 182/90, pulse (!) 52, height 5\' 9"  (1.753 m), weight 245 lb 8 oz (111.4 kg), SpO2 97 %.  GEN:  Well nourished, well developed in no acute distress HEENT: Normal NECK: No JVD; No carotid bruits LYMPHATICS: No lymphadenopathy CARDIAC: RRR   RESPIRATORY:  Clear to auscultation without rales, wheezing or rhonchi  ABDOMEN: Soft, non-tender, non-distended MUSCULOSKELETAL:  No  edema; No deformity  SKIN: Warm and dry NEUROLOGIC:  Alert and oriented x 3   ECG : November 10, 2019: Sinus bradycardia 53.  No ST or T wave changes.  ASSESSMENT:    1. Persistent atrial fibrillation ([place])   2. Thoracic aortic aneurysm without rupture ([place])   3. Chronic combined systolic and diastolic heart failure (HCC)    PLAN:    In order of problems listed above:  Atrial fibrillation: The patient presented to the hospital with multiple issues including atrial fibrillation, congestive heart failure, pulmonary embolus.  He had a successful cardioversion last month and is maintaining normal sinus rhythm.  We will decrease the amiodarone to 200 mg 3 times a week.  Anticipate stopping the amiodarone completely in several months.    2.  Congestive heart failure: He has acutely diagnosed systolic congestive heart failure.  His ejection fraction is 20 to 25%.   He is feeling much better.  There is a good possibility that his atrial fibrillation with RVR was contributing to his congestive heart failure.  He has been successfully cardioverted.  We will get a Exercise  Myoview study for further evaluation to evaluate for ischemia.  We will get a repeat echocardiogram.  We will start him on Entresto 24-26 mg twice a day.  We will see him again in 3 weeks( nurse visit vs. APP visit) with a basic metabolic profile.  We will anticipate increasing Entresto to 49-51 mg twice a day at that time.   3.  Pulmonary embolus: The patient was also found to have a pulmonary embolus  when he was admitted to the hospital.  He was treated with Eliquis 10 mg twice a day for 1 week and then reduce his Eliquis dose appropriately to 5 mg twice a day.     4.  Hyperlipidemia:  Cont meds.    5.  Ascending aortic aneurism.   Will be getting an echo in the next several weeks.    I will see him back in the office in 2-3 months to continue titration of his CHF meds  ( entresto, consider adding aldactone)    Medication Adjustments/Labs and Tests Ordered: Current medicines are reviewed at length with the patient today.  Concerns regarding medicines are outlined above.  Orders Placed This Encounter  Procedures   MYOCARDIAL PERFUSION IMAGING   ECHOCARDIOGRAM COMPLETE   Meds ordered this encounter  Medications   amiodarone (PACERONE) 200 MG tablet    Sig: Take 1 tablet (200 mg total) by mouth 3 (three) times a week.    Dispense:  45 tablet    Refill:  3   sacubitril-valsartan (ENTRESTO) 24-26 MG    Sig: Take 1 tablet by mouth 2 (two) times daily.    Dispense:  60 tablet    Refill:  11    Please Honor Card patient is presenting for Carmie Kanner: [id number]; Juanna Cao: [id number]; [id number]; [id number] ID: Pharmacy to complete     Patient Instructions  Medication Instructions:  Your physician has recommended you make the following change in your medication:  DECREASE Amiodarone to 200 mg three times per week (M W F) or whatever schedule works for you START Entresto (Valsartan/Sacubitril) 24-26 mg twice daily  *If you need a refill on your cardiac medications before your next appointment, please call your pharmacy*   Lab Work: Your physician recommends that you return for lab work in: at your next visit for [place]  If you have labs (blood work)  drawn today and your tests are completely normal, you will receive your results only by:  MyChart Message (if you have MyChart) OR  A paper copy in the mail If you have any lab test that is abnormal or we need to  change your treatment, we will call you to review the results.   Testing/Procedures: Your physician has requested that you have an echocardiogram. Echocardiography is a painless test that uses sound waves to create images of your heart. It provides your doctor with information about the size and shape of your heart and how well your hearts chambers and valves are working. This procedure takes approximately one hour. There are no restrictions for this procedure.  Your physician has requested that you have a lexiscan myoview. For further information please visit [email or web address]. Please follow instruction sheet, as given.    Follow-Up: At [facility], you and your health needs are our priority.  As part of our continuing mission to provide you with exceptional heart care, we have created designated Provider Care Teams.  These Care Teams include your primary Cardiologist (physician) and Advanced Practice Providers (APPs -  Physician Assistants and Nurse Practitioners) who all work together to provide you with the care you need, when you need it.  We recommend signing up for the patient portal called "MyChart".  Sign up information is provided on this After Visit Summary.  MyChart is used to connect with patients for Virtual Visits (Telemedicine).  Patients are able to view lab/test results, encounter notes, upcoming appointments, etc.  Non-urgent messages can be sent to your provider as well.   To learn more about what you can do with MyChart, go to [email or web address].    Your next appointment:   4 week(s)  The format for your next appointment:   In Person  Provider:   You may see [name], MD or one of the following Advanced Practice Providers on your designated Care Team:    Richardson Dopp, PA-C  Vin Rolla, [place]  Daune Perch, [place]        Signed, [name], MD  11/10/2019 11:33 AM    [place]

## 2019-11-14 ENCOUNTER — Telehealth: Payer: Self-pay | Admitting: Cardiovascular Disease

## 2019-11-14 NOTE — Telephone Encounter (Signed)
Jeffrey Griffin is calling to reschedule his covid screening that is scheduled for 11/22/19. Please advise.

## 2019-11-14 NOTE — Telephone Encounter (Signed)
I will forward this message to our PCC's as well as Edmon Crape who schedules the echo and nuc's. I will ask if one of them will be able to please reach out to the pt and reschedule to covid test.

## 2019-11-19 ENCOUNTER — Telehealth (HOSPITAL_COMMUNITY): Payer: Self-pay | Admitting: *Deleted

## 2019-11-19 ENCOUNTER — Encounter (HOSPITAL_COMMUNITY): Payer: Self-pay | Admitting: *Deleted

## 2019-11-19 NOTE — Telephone Encounter (Signed)
Left message on voicemail per DPR in reference to upcoming appointment scheduled on 11/26/2019 at 0715 with detailed instructions given per Myocardial Perfusion Study Information Sheet for the test. LM to arrive 15 minutes early, and that it is imperative to arrive on time for appointment to keep from having the test rescheduled. If you need to cancel or reschedule your appointment, please call the office within 24 hours of your appointment. Failure to do so may result in a cancellation of your appointment, and a $50 no show fee. Phone number given for call back for any questions.  Mychart letter sent with instructions.Chelsei Mcchesney, Ranae [place]

## 2019-11-19 NOTE — Addendum Note (Signed)
Addended by: Georgiann Cocker on: 11/19/2019 04:11 PM   Modules accepted: Orders

## 2019-11-22 ENCOUNTER — Other Ambulatory Visit (HOSPITAL_COMMUNITY)
Admission: RE | Admit: 2019-11-22 | Discharge: 2019-11-22 | Disposition: A | Discharge status: Home / Self Care | Payer: 59 | Source: Ambulatory Visit | Attending: Cardiovascular Disease | Admitting: Cardiovascular Disease

## 2019-11-22 DIAGNOSIS — Z01812 Encounter for preprocedural laboratory examination: Secondary | ICD-10-CM | POA: Insufficient documentation

## 2019-11-22 DIAGNOSIS — Z20822 Contact with and (suspected) exposure to covid-19: Secondary | ICD-10-CM | POA: Diagnosis not present

## 2019-11-22 LAB — SARS CORONAVIRUS 2 (TAT 6-24 HRS): SARS Coronavirus 2: NEGATIVE

## 2019-11-26 ENCOUNTER — Ambulatory Visit (HOSPITAL_COMMUNITY): Payer: 59 | Attending: Cardiovascular Disease

## 2019-11-26 ENCOUNTER — Other Ambulatory Visit: Payer: Self-pay

## 2019-11-26 ENCOUNTER — Ambulatory Visit (HOSPITAL_BASED_OUTPATIENT_CLINIC_OR_DEPARTMENT_OTHER): Payer: 59

## 2019-11-26 DIAGNOSIS — I4819 Other persistent atrial fibrillation: Secondary | ICD-10-CM | POA: Diagnosis not present

## 2019-11-26 DIAGNOSIS — I5042 Chronic combined systolic (congestive) and diastolic (congestive) heart failure: Secondary | ICD-10-CM

## 2019-11-26 DIAGNOSIS — I712 Thoracic aortic aneurysm, without rupture, unspecified: Secondary | ICD-10-CM

## 2019-11-26 LAB — MYOCARDIAL PERFUSION IMAGING
LV dias vol: 162 mL (ref 62–150)
LV sys vol: 96 mL
Peak HR: 78 {beats}/min
Rest HR: 63 {beats}/min
SDS: 5
SRS: 2
SSS: 7
TID: 0.93

## 2019-11-26 MED ORDER — REGADENOSON 0.4 MG/5ML IV SOLN
0.4000 mg | Freq: Once | INTRAVENOUS | Status: AC
  Administered 2019-11-26: 0.4 mg via INTRAVENOUS

## 2019-11-26 MED ORDER — TECHNETIUM TC 99M TETROFOSMIN IV KIT
10.3000 | PACK | Freq: Once | INTRAVENOUS | Status: AC | PRN
  Administered 2019-11-26: 10.3 via INTRAVENOUS
  Filled 2019-11-26: qty 11

## 2019-11-26 MED ORDER — TECHNETIUM TC 99M TETROFOSMIN IV KIT
31.2000 | PACK | Freq: Once | INTRAVENOUS | Status: AC | PRN
  Administered 2019-11-26: 31.2 via INTRAVENOUS
  Filled 2019-11-26: qty 32

## 2019-12-11 ENCOUNTER — Ambulatory Visit (INDEPENDENT_AMBULATORY_CARE_PROVIDER_SITE_OTHER): Payer: 59 | Admitting: Cardiovascular Disease

## 2019-12-11 ENCOUNTER — Encounter: Payer: Self-pay | Admitting: Cardiovascular Disease

## 2019-12-11 ENCOUNTER — Other Ambulatory Visit: Payer: Self-pay

## 2019-12-11 VITALS — BP 138/86 | HR 57 | Ht 69.0 in | Wt 242.5 lb

## 2019-12-11 DIAGNOSIS — I5042 Chronic combined systolic (congestive) and diastolic (congestive) heart failure: Secondary | ICD-10-CM

## 2019-12-11 DIAGNOSIS — E785 Hyperlipidemia, unspecified: Secondary | ICD-10-CM | POA: Diagnosis not present

## 2019-12-11 DIAGNOSIS — I4819 Other persistent atrial fibrillation: Secondary | ICD-10-CM | POA: Diagnosis not present

## 2019-12-11 DIAGNOSIS — I110 Hypertensive heart disease with heart failure: Secondary | ICD-10-CM

## 2019-12-11 DIAGNOSIS — I2694 Multiple subsegmental pulmonary emboli without acute cor pulmonale: Secondary | ICD-10-CM

## 2019-12-11 DIAGNOSIS — I5023 Acute on chronic systolic (congestive) heart failure: Secondary | ICD-10-CM

## 2019-12-11 MED ORDER — ENTRESTO 49-51 MG PO TABS: 1.0000 | ORAL_TABLET | Freq: Two times a day (BID) | ORAL | 11 refills | Status: DC

## 2019-12-11 NOTE — Progress Notes (Signed)
Cardiology Office Note:    Date:  12/11/2019   ID:  Jeffrey Griffin, DOB December 25, 1960, MRN ID:[id number]  PCP:  No primary care provider on file.  Cardiologist:  Deliah Goody  Electrophysiologist:  None   Referring MD: No ref. provider found   Chief Complaint  Patient presents with  . Atrial Fibrillation    Problem list 1.  Acute pulmonary embolism - Jan. 11, 2021  2.  Atrial fibrillation 3.   Ascending thoracic aortic aneurysm ( 4.3 cm)  4.  Acute systolic congestive heart failure-ejection fraction 20 to 25%.    History of Present Illness:    Jeffrey Griffin is a 59 y.o. male with a recent hospitalization at [facility] for atrial fibrillation.  He was originally seen down in [facility] but wanted to follow-up in [place].  His symptoms started sometime prior to Christmas.  He presented to the urgent care with 4 days of severe shortness of breath and orthopnea.  He was found to have atrial fibrillation.  CT angiogram of the chest showed acute subsegmental pulmonary embolism to the right upper and right lower lobes.  He also was found to have a 4.3 cm ascending aortic aneurysm and a 4 mm right lung nodule.  No stress test or cath performed.  No angina   Family hx of cardiac disease Mom has IHSS Father had CVAs  Brother had Afib / ablation Sister has HTN   Cannot tell that his HR is very iireg.    Breathing is better.    No leg trauma,  No long trips to explain his pulmonary embolus.    Feeling better.  A bit fatigued Used to drink coffee and has been avoiding it recently   Prior to hospitalization , did not follow any diet .  Takes a salad to work for lunch  . Breakfast is scrambled egg white, with veggies + chicken  Dinner is salad, veggies,   Eats suasage several times a week ,   Works out with a trainer several times a week .   November 10, 2019: Jeffrey Griffin is seen back today for follow-up of his atrial fibrillation, congestive heart failure, and  pulmonary embolus, and 4.3 cm ascending aortic aneurysm.  He had a successful cardioversion on October 20, 2019. Feeling much better after cardioversion Still has some episodes of dyspnea.  Has gained weight this past week .   Has been trying to avoid salty foods.  No CP. No palpitations   December 11, 2019:  Jeffrey Griffin is seen today for a follow of his atrial fibrillation, congestive heart failure, pulmonary embolus and 4.3 cm ascending aortic aneurysm. Had a successful cardioversion in February, 2021. Occasional dyspnea.  Not with exertion .  Has been working out 3 times a week.    Past Medical History:  Diagnosis Date  . A-fib (HCC)    WITH RVR  . Abnormality of thoracic aorta    4.3 CM ECTATIC ASENDING   . Cardiopathy   . CHF (congestive heart failure) (HCC)    SYSTOLIC  . Hyperlipidemia   . Lung nodule   . Pleural effusion    RIGHT UPPER LOBE AND RIGHT LOWER LOBE     Past Surgical History:  Procedure Laterality Date  . APPENDECTOMY    . CARDIOVERSION N/A 10/20/2019   Procedure: CARDIOVERSION;  Surgeon: Acie Fredrickson, Wonda Cheng, MD;  Location: [facility];  Service: Cardiovascular;  Laterality: N/A;  . VASECTOMY      Current Medications: Current Meds  Medication Sig  .  apixaban (ELIQUIS) 5 MG TABS tablet Take 1 tablet (5 mg total) by mouth 2 (two) times daily.  Marland Kitchen ascorbic acid (CVS VITAMIN C) 500 MG tablet Take 500 mg by mouth daily.  Marland Kitchen atorvastatin (LIPITOR) 10 MG tablet Take 1 tablet (10 mg total) by mouth daily.  . Cholecalciferol (VITAMIN D3) 25 MCG (1000 UT) CAPS Take 1,000 Units by mouth daily.   . Glucosamine-Chondroitin (COSAMIN DS PO) Take 2 tablets by mouth daily.  . metoprolol succinate (TOPROL-XL) 100 MG 24 hr tablet Take 1 tablet (100 mg total) by mouth daily. Take with or immediately following a meal.  . Multiple Minerals-Vitamins (CAL MAG ZINC +D3 PO) Take 1 tablet by mouth daily.   . Multiple Vitamin (MULTIVITAMIN WITH MINERALS) TABS tablet Take 1 tablet by mouth  daily.  . [DISCONTINUED] amiodarone (PACERONE) 200 MG tablet Take 1 tablet (200 mg total) by mouth 3 (three) times a week.  . [DISCONTINUED] sacubitril-valsartan (ENTRESTO) 24-26 MG Take 1 tablet by mouth 2 (two) times daily.     Allergies:   Patient has no known allergies.   Social History   Socioeconomic History  . Marital status: Married    Spouse name: Not on file  . Number of children: Not on file  . Years of education: Not on file  . Highest education level: Not on file  Occupational History  . Not on file  Tobacco Use  . Smoking status: Never Smoker  . Smokeless tobacco: Never Used  Substance and Sexual Activity  . Alcohol use: Yes    Alcohol/week: 0.0 standard drinks  . Drug use: No  . Sexual activity: Not on file  Other Topics Concern  . Not on file  Social History Narrative  . Not on file   Social Determinants of Health   Financial Resource Strain:   . Difficulty of Paying Living Expenses:   Food Insecurity:   . Worried About Charity fundraiser in the Last Year:   . Arboriculturist in the Last Year:   Transportation Needs:   . Film/video editor (Medical):   Marland Kitchen Lack of Transportation (Non-Medical):   Physical Activity:   . Days of Exercise per Week:   . Minutes of Exercise per Session:   Stress:   . Feeling of Stress :   Social Connections:   . Frequency of Communication with Friends and Family:   . Frequency of Social Gatherings with Friends and Family:   . Attends Religious Services:   . Active Member of Clubs or Organizations:   . Attends Archivist Meetings:   Marland Kitchen Marital Status:      Family History: The patient's family history includes Atrial fibrillation in his brother; Hypertension in his father and sister; Hypertrophic cardiomyopathy in his mother; Stroke in his father.  ROS:     EKGs/Labs/Other Studies Reviewed:     EKG:    Recent Labs: 10/01/2019: Platelets 243 10/20/2019: BUN 20; Creatinine, Ser 0.80; Hemoglobin 18.0;  Potassium 4.4; Sodium 139  Recent Lipid Panel    Component Value Date/Time   CHOL 224 (H) 08/14/2015 1015   TRIG 140 08/14/2015 1015   HDL 42 08/14/2015 1015   CHOLHDL 5.3 (H) 08/14/2015 1015   VLDL 28 08/14/2015 1015   LDLCALC 154 (H) 08/14/2015 1015    Physical Exam:     Physical Exam: Blood pressure 138/86, pulse (!) 57, height 5\' 9"  (1.753 m), weight 242 lb 8 oz (110 kg), SpO2 97 %.  GEN:  Well nourished, well developed in no acute distress HEENT: Normal NECK: No JVD; No carotid bruits LYMPHATICS: No lymphadenopathy CARDIAC: RRR , no murmurs, rubs, gallops RESPIRATORY:  Clear to auscultation without rales, wheezing or rhonchi  ABDOMEN: Soft, non-tender, non-distended MUSCULOSKELETAL:  No edema; No deformity  SKIN: Warm and dry NEUROLOGIC:  Alert and oriented x 3   ECG :   ASSESSMENT:    1. Persistent atrial fibrillation ([place])   2. Chronic combined systolic and diastolic heart failure ([place])   3. Hypertensive heart disease with heart failure ([place])   4. Hyperlipidemia, unspecified hyperlipidemia type    PLAN:    In order of problems listed above:  1.,.  Atrial fibrillation: He is doing very well.  Is not had any recurrence of atrial fibrillation.  We will stop the amiodarone at this point.  We will continue Eliquis since he is also had a pulmonary list.  2.  Congestive heart failure:   He is doing well from a heart failure standpoint.  His ejection fraction has gradually improved.  He is not having any symptoms.  We will increase the Entresto to 49-51 twice a day.  We will get basic metabolic profile, lipids, liver enzymes in 2 to 3 weeks.  We will have him see an APP in 6 weeks.  If he remains hypertensive we will add spironolactone at that time.  3.  Pulmonary embolus:  .  History of pulmonary embolus.  Continue Eliquis.  4.  Hypertension: His blood pressures at home of been moderately elevated.  His blood pressure here is only minimally elevated.  We are  increasing Entresto.  Anticipate adding spironolactone at his next visit if he remains hypertensive.  I reminded him to watch his salt intake.   4.  Hyperlipidemia:   We will check lipids, liver enzymes in 2 to 3 weeks.  Continue atorvastatin.  5.  Ascending aortic aneurism.   Will be getting an echo in the next several weeks.    Repeat echocardiogram from March, 2021 shows mild dilatation of the ascending aorta measuring 42 mm.   Medication Adjustments/Labs and Tests Ordered: Current medicines are reviewed at length with the patient today.  Concerns regarding medicines are outlined above.  Orders Placed This Encounter  Procedures  . Lipid panel  . Basic Metabolic Panel (BMET)  . Hepatic function panel   Meds ordered this encounter  Medications  . sacubitril-valsartan (ENTRESTO) 49-51 MG    Sig: Take 1 tablet by mouth 2 (two) times daily.    Dispense:  60 tablet    Refill:  11     Patient Instructions  Medication Instructions:  Your physician has recommended you make the following change in your medication:  STOP Amiodarone  INCREASE Entresto to 49-51 mg twice daily  *If you need a refill on your cardiac medications before your next appointment, please call your pharmacy*   Lab Work: Your physician recommends that you return for lab work in: 2-3 weeks.  You will need to FAST for this appointment - nothing to eat or drink after midnight the night before except water.   If you have labs (blood work) drawn today and your tests are completely normal, you will receive your results only by: Marland Kitchen MyChart Message (if you have MyChart) OR . A paper copy in the mail If you have any lab test that is abnormal or we need to change your treatment, we will call you to review the results.   Testing/Procedures: None Ordered  Follow-Up: At [facility], you and your health needs are our priority.  As part of our continuing mission to provide you with exceptional heart care, we have  created designated Provider Care Teams.  These Care Teams include your primary Cardiologist (physician) and Advanced Practice Providers (APPs -  Physician Assistants and Nurse Practitioners) who all work together to provide you with the care you need, when you need it.  Your next appointment:   6 week(s)  The format for your next appointment:   In Person  Provider:   Richardson Dopp, PA-C or Robbie Lis, PA-C         Signed, [name], MD  12/11/2019 10:36 AM    [place]

## 2019-12-11 NOTE — Patient Instructions (Addendum)
Medication Instructions:  Your physician has recommended you make the following change in your medication:  STOP Amiodarone  INCREASE Entresto to 49-51 mg twice daily  *If you need a refill on your cardiac medications before your next appointment, please call your pharmacy*   Lab Work: Your physician recommends that you return for lab work in: 2-3 weeks.  You will need to FAST for this appointment - nothing to eat or drink after midnight the night before except water.   If you have labs (blood work) drawn today and your tests are completely normal, you will receive your results only by: Marland Kitchen MyChart Message (if you have MyChart) OR . A paper copy in the mail If you have any lab test that is abnormal or we need to change your treatment, we will call you to review the results.   Testing/Procedures: None Ordered    Follow-Up: At [facility], you and your health needs are our priority.  As part of our continuing mission to provide you with exceptional heart care, we have created designated Provider Care Teams.  These Care Teams include your primary Cardiologist (physician) and Advanced Practice Providers (APPs -  Physician Assistants and Nurse Practitioners) who all work together to provide you with the care you need, when you need it.  Your next appointment:   6 week(s)  The format for your next appointment:   In Person  Provider:   Richardson Dopp, PA-C or Robbie Lis, PA-C

## 2019-12-29 ENCOUNTER — Other Ambulatory Visit: Payer: 59 | Admitting: *Deleted

## 2019-12-29 ENCOUNTER — Other Ambulatory Visit: Payer: Self-pay

## 2019-12-29 DIAGNOSIS — I5042 Chronic combined systolic (congestive) and diastolic (congestive) heart failure: Secondary | ICD-10-CM

## 2019-12-29 DIAGNOSIS — E785 Hyperlipidemia, unspecified: Secondary | ICD-10-CM

## 2019-12-29 DIAGNOSIS — I110 Hypertensive heart disease with heart failure: Secondary | ICD-10-CM

## 2019-12-29 DIAGNOSIS — I4819 Other persistent atrial fibrillation: Secondary | ICD-10-CM

## 2019-12-29 LAB — LIPID PANEL
Chol/HDL Ratio: 4.8 ratio (ref 0.0–5.0)
Cholesterol, Total: 186 mg/dL (ref 100–199)
HDL: 39 mg/dL — ABNORMAL LOW (ref >39)
LDL Chol Calc (NIH): 128 mg/dL — ABNORMAL HIGH (ref 0–99)
Triglycerides: 101 mg/dL (ref 0–149)
VLDL Cholesterol Cal: 19 mg/dL (ref 5–40)

## 2019-12-29 LAB — HEPATIC FUNCTION PANEL
ALT: 25 IU/L (ref 0–44)
AST: 26 IU/L (ref 0–40)
Albumin: 4.2 g/dL (ref 3.8–4.9)
Alkaline Phosphatase: 93 IU/L (ref 39–117)
Bilirubin Total: 0.4 mg/dL (ref 0.0–1.2)
Bilirubin, Direct: 0.11 mg/dL (ref 0.00–0.40)
Total Protein: 6.5 g/dL (ref 6.0–8.5)

## 2019-12-29 LAB — BASIC METABOLIC PANEL
BUN/Creatinine Ratio: 17 (ref 9–20)
BUN: 19 mg/dL (ref 6–24)
CO2: 19 mmol/L — ABNORMAL LOW (ref 20–29)
Calcium: 10.3 mg/dL — ABNORMAL HIGH (ref 8.7–10.2)
Chloride: 105 mmol/L (ref 96–106)
Creatinine, Ser: 1.09 mg/dL (ref 0.76–1.27)
GFR calc Af Amer: 87 mL/min/{1.73_m2} (ref >59)
GFR calc non Af Amer: 75 mL/min/{1.73_m2} (ref >59)
Glucose: 111 mg/dL — ABNORMAL HIGH (ref 65–99)
Potassium: 4.4 mmol/L (ref 3.5–5.2)
Sodium: 140 mmol/L (ref 134–144)

## 2019-12-30 ENCOUNTER — Telehealth: Payer: Self-pay | Admitting: Nurse Practitioner

## 2019-12-30 DIAGNOSIS — I110 Hypertensive heart disease with heart failure: Secondary | ICD-10-CM

## 2019-12-30 DIAGNOSIS — E785 Hyperlipidemia, unspecified: Secondary | ICD-10-CM

## 2019-12-30 MED ORDER — ATORVASTATIN CALCIUM 40 MG PO TABS: 40.0000 mg | ORAL_TABLET | Freq: Every day | ORAL | 3 refills | Status: DC

## 2019-12-30 NOTE — Telephone Encounter (Signed)
Reviewed lab results and plan of care with patient who verbalized understanding and agreement to increase atorvastatin to 40 mg daily. He is scheduled for lab appointment on 7/27 and agrees to call back with questions or concerns. He is also agreeable to keep his appointment with Richardson Dopp, PA on May 24. He thanked me for the call.

## 2019-12-30 NOTE — Telephone Encounter (Signed)
-----   Message from Thayer Headings, MD sent at 12/30/2019 10:55 AM EDT ----- Increase the atorvastatin to 40 mg a day .  Recheck lipids. Liver, bmp in 3 months

## 2020-01-26 ENCOUNTER — Ambulatory Visit: Payer: 59 | Admitting: Physician Assistant

## 2020-01-30 ENCOUNTER — Other Ambulatory Visit: Payer: Self-pay | Admitting: Cardiovascular Disease

## 2020-01-30 NOTE — Telephone Encounter (Signed)
Pt last saw Dr Acie Fredrickson 12/11/19, last labs 12/29/19 Creat 1.09, age 59, weight 110kg, based on specified criteria pt is on appropriate dosage of Eliquis 5mg  BID.  Will refill rx.

## 2020-02-17 ENCOUNTER — Other Ambulatory Visit: Payer: Self-pay

## 2020-02-17 ENCOUNTER — Other Ambulatory Visit: Payer: Self-pay | Admitting: *Deleted

## 2020-02-17 MED ORDER — APIXABAN 5 MG PO TABS: 5.0000 mg | ORAL_TABLET | Freq: Two times a day (BID) | ORAL | 5 refills | Status: DC

## 2020-02-17 MED ORDER — APIXABAN 5 MG PO TABS: 5.0000 mg | ORAL_TABLET | Freq: Two times a day (BID) | ORAL | 1 refills | Status: DC

## 2020-02-17 NOTE — Telephone Encounter (Signed)
Previous refill sent for 90 day supply and pt refer 30 day supply per the Eliquis refill message. Eliquis 5mg  refill request received. Patient is 59 years old, weight-110kg, Crea-1.09 on 12/29/2019, Diagnosis-Afib and Pulmonary Embolus, and last seen by Dr. Acie Fredrickson on 12/11/2019. Dose is appropriate based on dosing criteria. Will send in requested refill to requested pharmacy.

## 2020-02-17 NOTE — Telephone Encounter (Signed)
Prescription refill request for Eliquis received.  Last office visit: [place], 12/11/2019 Scr: 1.09, 12/29/2019 Age: 59 y.o. Weight: 110 kg   Prescription refill sent to [facility].

## 2020-02-17 NOTE — Telephone Encounter (Signed)
Pt called and stated he only wants 30 days supply at a time and for RX to be sent to [facility] [id number] at [address]

## 2020-03-30 ENCOUNTER — Other Ambulatory Visit: Payer: Self-pay

## 2020-03-30 ENCOUNTER — Other Ambulatory Visit: Payer: BLUE CROSS/BLUE SHIELD

## 2020-03-30 DIAGNOSIS — E785 Hyperlipidemia, unspecified: Secondary | ICD-10-CM | POA: Diagnosis not present

## 2020-03-30 DIAGNOSIS — I110 Hypertensive heart disease with heart failure: Secondary | ICD-10-CM | POA: Diagnosis not present

## 2020-03-30 LAB — HEPATIC FUNCTION PANEL
ALT: 22 IU/L (ref 0–44)
AST: 19 IU/L (ref 0–40)
Albumin: 4.1 g/dL (ref 3.8–4.9)
Alkaline Phosphatase: 74 IU/L (ref 48–121)
Bilirubin Total: 0.5 mg/dL (ref 0.0–1.2)
Bilirubin, Direct: 0.14 mg/dL (ref 0.00–0.40)
Total Protein: 6.3 g/dL (ref 6.0–8.5)

## 2020-03-30 LAB — BASIC METABOLIC PANEL
BUN/Creatinine Ratio: 18 (ref 9–20)
BUN: 18 mg/dL (ref 6–24)
CO2: 25 mmol/L (ref 20–29)
Calcium: 9.9 mg/dL (ref 8.7–10.2)
Chloride: 103 mmol/L (ref 96–106)
Creatinine, Ser: 0.99 mg/dL (ref 0.76–1.27)
GFR calc Af Amer: 98 mL/min/{1.73_m2} (ref >59)
GFR calc non Af Amer: 85 mL/min/{1.73_m2} (ref >59)
Glucose: 107 mg/dL — ABNORMAL HIGH (ref 65–99)
Potassium: 4.5 mmol/L (ref 3.5–5.2)
Sodium: 140 mmol/L (ref 134–144)

## 2020-03-30 LAB — LIPID PANEL
Chol/HDL Ratio: 4.4 ratio (ref 0.0–5.0)
Cholesterol, Total: 150 mg/dL (ref 100–199)
HDL: 34 mg/dL — ABNORMAL LOW (ref >39)
LDL Chol Calc (NIH): 87 mg/dL (ref 0–99)
Triglycerides: 168 mg/dL — ABNORMAL HIGH (ref 0–149)
VLDL Cholesterol Cal: 29 mg/dL (ref 5–40)

## 2020-04-05 ENCOUNTER — Encounter: Payer: Self-pay | Admitting: Cardiovascular Disease

## 2020-04-05 DIAGNOSIS — M542 Cervicalgia: Secondary | ICD-10-CM | POA: Diagnosis not present

## 2020-04-05 DIAGNOSIS — M898X1 Other specified disorders of bone, shoulder: Secondary | ICD-10-CM | POA: Diagnosis not present

## 2020-04-05 DIAGNOSIS — R972 Elevated prostate specific antigen [PSA]: Secondary | ICD-10-CM | POA: Diagnosis not present

## 2020-04-05 NOTE — Progress Notes (Signed)
Cardiology Office Note:    Date:  04/06/2020   ID:  Jeffrey Griffin, DOB [birth year], MRN [id number]  PCP:  Alroy Dust, Carlean Jews.Jeffrey Sa, MD  Cardiologist:  Geraldo Pitter Chiron Campione  Electrophysiologist:  None   Referring MD: Alroy Dust, Carlean Jews.Jeffrey Sa, MD   Chief Complaint  Patient presents with  . Atrial Fibrillation    Problem list 1.  Acute pulmonary embolism - Jan. 11, 2021  2.  Atrial fibrillation 3.   Ascending thoracic aortic aneurysm ( 4.3 cm)  4.  Acute systolic congestive heart failure-ejection fraction 20 to 25%.    History of Present Illness:    Jeffrey Griffin is a 59 y.o. male with a recent hospitalization at [facility] for atrial fibrillation.  He was originally seen down in [facility] but wanted to follow-up in [place].  His symptoms started sometime prior to Christmas.  He presented to the urgent care with 4 days of severe shortness of breath and orthopnea.  He was found to have atrial fibrillation.  CT angiogram of the chest showed acute subsegmental pulmonary embolism to the right upper and right lower lobes.  He also was found to have a 4.3 cm ascending aortic aneurysm and a 4 mm right lung nodule.  No stress test or cath performed.  No angina   Family hx of cardiac disease Mom has IHSS Father had CVAs  Brother had Afib / ablation Sister has HTN   Cannot tell that his HR is very iireg.    Breathing is better.    No leg trauma,  No long trips to explain his pulmonary embolus.    Feeling better.  A bit fatigued Used to drink coffee and has been avoiding it recently   Prior to hospitalization , did not follow any diet .  Takes a salad to work for lunch  . Breakfast is scrambled egg white, with veggies + chicken  Dinner is salad, veggies,   Eats suasage several times a week ,   Works out with a trainer several times a week .   November 10, 2019: [name] is seen back today for follow-up of his atrial fibrillation, congestive heart failure, and pulmonary embolus, and  4.3 cm ascending aortic aneurysm.  He had a successful cardioversion on October 20, 2019. Feeling much better after cardioversion Still has some episodes of dyspnea.  Has gained weight this past week .   Has been trying to avoid salty foods.  No CP. No palpitations   December 11, 2019:  [name] is seen today for a follow of his atrial fibrillation, congestive heart failure, pulmonary embolus and 4.3 cm ascending aortic aneurysm. Had a successful cardioversion in February, 2021. Occasional dyspnea.  Not with exertion .  Has been working out 3 times a week.    April 06, 2020: Jeffrey Griffin is seen today for follow-up of his paroxysmal atrial fibrillation, congestive heart failure, pulmonary loss, and moderate ascending aortic aneurysm.  He had a successful cardioversion in February, 2021. Cannot tell if his HR is regular or not  ( he is back in AF today )  Has changed jobs.  Lost his insurance Now is on new insurance,    Lipids and cmet are reviewed and are improving   Past Medical History:  Diagnosis Date  . A-fib (HCC)    WITH RVR  . Abnormality of thoracic aorta    4.3 CM ECTATIC ASENDING   . Cardiopathy   . CHF (congestive heart failure) (HCC)    SYSTOLIC  . Hyperlipidemia   .  Lung nodule   . Pleural effusion    RIGHT UPPER LOBE AND RIGHT LOWER LOBE     Past Surgical History:  Procedure Laterality Date  . APPENDECTOMY    . CARDIOVERSION N/A 10/20/2019   Procedure: CARDIOVERSION;  Surgeon: Thayer Headings, MD;  Location: [facility] ENDOSCOPY;  Service: Cardiovascular;  Laterality: N/A;  . VASECTOMY      Current Medications: Current Meds  Medication Sig  . apixaban (ELIQUIS) 5 MG TABS tablet Take 1 tablet (5 mg total) by mouth 2 (two) times daily.  Marland Kitchen ascorbic acid (CVS VITAMIN C) 500 MG tablet Take 500 mg by mouth daily.  Marland Kitchen atorvastatin (LIPITOR) 40 MG tablet Take 1 tablet (40 mg total) by mouth daily.  . Cholecalciferol (VITAMIN D3) 25 MCG (1000 UT) CAPS Take 1,000 Units by mouth  daily.   . Glucosamine-Chondroitin (COSAMIN DS PO) Take 2 tablets by mouth daily.  . metoprolol succinate (TOPROL-XL) 100 MG 24 hr tablet Take 1 tablet (100 mg total) by mouth daily. Take with or immediately following a meal.  . Multiple Minerals-Vitamins (CAL MAG ZINC +D3 PO) Take 1 tablet by mouth daily.   . Multiple Vitamin (MULTIVITAMIN WITH MINERALS) TABS tablet Take 1 tablet by mouth daily.  . predniSONE (DELTASONE) 20 MG tablet Take 20 mg by mouth 2 (two) times daily. 6 days only  . sacubitril-valsartan (ENTRESTO) 49-51 MG Take 1 tablet by mouth 2 (two) times daily.     Allergies:   Patient has no known allergies.   Social History   Socioeconomic History  . Marital status: Married    Spouse name: Not on file  . Number of children: Not on file  . Years of education: Not on file  . Highest education level: Not on file  Occupational History  . Not on file  Tobacco Use  . Smoking status: Never Smoker  . Smokeless tobacco: Never Used  Substance and Sexual Activity  . Alcohol use: Yes    Alcohol/week: 0.0 standard drinks  . Drug use: No  . Sexual activity: Not on file  Other Topics Concern  . Not on file  Social History Narrative  . Not on file   Social Determinants of Health   Financial Resource Strain:   . Difficulty of Paying Living Expenses:   Food Insecurity:   . Worried About Charity fundraiser in the Last Year:   . Arboriculturist in the Last Year:   Transportation Needs:   . Film/video editor (Medical):   Marland Kitchen Lack of Transportation (Non-Medical):   Physical Activity:   . Days of Exercise per Week:   . Minutes of Exercise per Session:   Stress:   . Feeling of Stress :   Social Connections:   . Frequency of Communication with Friends and Family:   . Frequency of Social Gatherings with Friends and Family:   . Attends Religious Services:   . Active Member of Clubs or Organizations:   . Attends Archivist Meetings:   Marland Kitchen Marital Status:       Family History: The patient's family history includes Atrial fibrillation in his brother; Hypertension in his father and sister; Hypertrophic cardiomyopathy in his mother; Stroke in his father.  ROS:     EKGs/Labs/Other Studies Reviewed:     EKG:    Recent Labs: 10/01/2019: Platelets 243 10/20/2019: Hemoglobin 18.0 03/30/2020: ALT 22; BUN 18; Creatinine, Ser 0.99; Potassium 4.5; Sodium 140  Recent Lipid Panel    Component Value  Date/Time   CHOL 150 03/30/2020 0742   TRIG 168 (H) 03/30/2020 0742   HDL 34 (L) 03/30/2020 0742   CHOLHDL 4.4 03/30/2020 0742   CHOLHDL 5.3 (H) 08/14/2015 1015   VLDL 28 08/14/2015 1015   LDLCALC 87 03/30/2020 0742    Physical Exam:    Physical Exam: Blood pressure 136/88, pulse 88, height 5\' 9"  (1.753 m), weight 244 lb 9.6 oz (110.9 kg), SpO2 97 %.  GEN:  Well nourished, well developed in no acute distress HEENT: Normal NECK: No JVD; No carotid bruits LYMPHATICS: No lymphadenopathy CARDIAC: Irreg. Irreg.  RESPIRATORY:  Clear to auscultation without rales, wheezing or rhonchi  ABDOMEN: Soft, non-tender, non-distended MUSCULOSKELETAL:  No edema; No deformity  SKIN: Warm and dry NEUROLOGIC:  Alert and oriented x 3   ECG :   ASSESSMENT:    1. Persistent atrial fibrillation ([place])   2. Thoracic aortic aneurysm without rupture ([place])    PLAN:    In order of problems listed above:  1.,.  Atrial fibrillation: Unfortunately Brax is gone back into atrial fibrillation.  He remains on Eliquis.  His rate is still fairly well controlled.  He cannot tell that his heart rate is irregular.  At this point I will send him to the A. fib clinic.  He will likely need to be started on antiarrhythmic with 3 attempts at cardioversion.   2.  Congestive heart failure:   His ejection fraction has improved to 40%.  Continue Entresto and Toprol-XL.  I have advised him to work on salt restriction.   3.  Pulmonary embolus:  .  Continue Eliquis.  4.   Hypertension: Diastolic blood pressure is mildly elevated although he notes that at home it is typically well controlled.  He has been rushing around a bit this morning.  4.  Hyperlipidemia:   Lipid levels look good.  5.  Ascending aortic aneurism.   At [facility], he was found to have a 4.3 cm thoracic ascending aortic aneurysm.  We do not have any documentation here.  I will get a CT angiogram of the entire aorta to look for aneurysms.  At the appropriate time we will refer him to [facility] to be followed.   Medication Adjustments/Labs and Tests Ordered: Current medicines are reviewed at length with the patient today.  Concerns regarding medicines are outlined above.  Orders Placed This Encounter  Procedures  . CT ANGIO CHEST AORTA W/CM & OR WO/CM  . Amb Referral to AFIB Clinic  . EKG 12-Lead   No orders of the defined types were placed in this encounter.    Patient Instructions  Medication Instructions:  Your physician recommends that you continue on your current medications as directed. Please refer to the Current Medication list given to you today.  *If you need a refill on your cardiac medications before your next appointment, please call your pharmacy*  Testing/Procedures: CT Angiography (CTA), is a special type of CT scan that uses a computer to produce multi-dimensional views of major blood vessels throughout the body. In CT angiography, a contrast material is injected through an IV to help visualize the blood vessels  Follow-Up: At [facility], you and your health needs are our priority.  As part of our continuing mission to provide you with exceptional heart care, we have created designated Provider Care Teams.  These Care Teams include your primary Cardiologist (physician) and Advanced Practice Providers (APPs -  Physician Assistants and Nurse Practitioners) who all work together to provide  you with the care you need, when you need it.  Your next appointment:   3  month(s)  The format for your next appointment:   In Person  Provider:   You may see [name], MD or one of the following Advanced Practice Providers on your designated Care Team:    Richardson Dopp, PA-C  Robbie Lis, [place]       Signed, [name], MD  04/06/2020 8:33 AM    [place]

## 2020-04-06 ENCOUNTER — Encounter: Payer: Self-pay | Admitting: Cardiovascular Disease

## 2020-04-06 ENCOUNTER — Other Ambulatory Visit: Payer: Self-pay

## 2020-04-06 ENCOUNTER — Ambulatory Visit: Payer: BLUE CROSS/BLUE SHIELD | Admitting: Cardiovascular Disease

## 2020-04-06 VITALS — BP 136/88 | HR 88 | Ht 69.0 in | Wt 244.6 lb

## 2020-04-06 DIAGNOSIS — I5042 Chronic combined systolic (congestive) and diastolic (congestive) heart failure: Secondary | ICD-10-CM

## 2020-04-06 DIAGNOSIS — I712 Thoracic aortic aneurysm, without rupture, unspecified: Secondary | ICD-10-CM

## 2020-04-06 DIAGNOSIS — R1907 Generalized intra-abdominal and pelvic swelling, mass and lump: Secondary | ICD-10-CM | POA: Diagnosis not present

## 2020-04-06 DIAGNOSIS — I4819 Other persistent atrial fibrillation: Secondary | ICD-10-CM

## 2020-04-06 NOTE — Patient Instructions (Addendum)
Medication Instructions:  Your physician recommends that you continue on your current medications as directed. Please refer to the Current Medication list given to you today.  *If you need a refill on your cardiac medications before your next appointment, please call your pharmacy*  Testing/Procedures: CT Angiography (CTA), is a special type of CT scan that uses a computer to produce multi-dimensional views of major blood vessels throughout the body. In CT angiography, a contrast material is injected through an IV to help visualize the blood vessels  Follow-Up: At [facility], you and your health needs are our priority.  As part of our continuing mission to provide you with exceptional heart care, we have created designated Provider Care Teams.  These Care Teams include your primary Cardiologist (physician) and Advanced Practice Providers (APPs -  Physician Assistants and Nurse Practitioners) who all work together to provide you with the care you need, when you need it.  Your next appointment:   3 month(s)  The format for your next appointment:   In Person  Provider:   You may see [name], MD or one of the following Advanced Practice Providers on your designated Care Team:    Richardson Dopp, PA-C  Vin Jackson, [place]   Other Instructions: You have been referred to the atrial fibrillation clinic.

## 2020-04-07 NOTE — Addendum Note (Signed)
Addended by: Antonieta Iba on: 04/07/2020 10:22 AM   Modules accepted: Orders

## 2020-04-07 NOTE — Addendum Note (Signed)
Addended by: Antonieta Iba on: 04/07/2020 10:07 AM   Modules accepted: Orders

## 2020-04-13 ENCOUNTER — Other Ambulatory Visit: Payer: Self-pay

## 2020-04-13 ENCOUNTER — Encounter (HOSPITAL_COMMUNITY): Payer: Self-pay | Admitting: Physician Assistant

## 2020-04-13 ENCOUNTER — Ambulatory Visit (HOSPITAL_COMMUNITY)
Admission: RE | Admit: 2020-04-13 | Discharge: 2020-04-13 | Disposition: A | Discharge status: Home / Self Care | Payer: BLUE CROSS/BLUE SHIELD | Source: Ambulatory Visit | Attending: Physician Assistant | Admitting: Physician Assistant

## 2020-04-13 VITALS — BP 136/90 | HR 93 | Ht 69.0 in | Wt 242.8 lb

## 2020-04-13 DIAGNOSIS — R0683 Snoring: Secondary | ICD-10-CM

## 2020-04-13 DIAGNOSIS — I4819 Other persistent atrial fibrillation: Secondary | ICD-10-CM | POA: Insufficient documentation

## 2020-04-13 DIAGNOSIS — Z7901 Long term (current) use of anticoagulants: Secondary | ICD-10-CM | POA: Diagnosis not present

## 2020-04-13 DIAGNOSIS — Z8249 Family history of ischemic heart disease and other diseases of the circulatory system: Secondary | ICD-10-CM | POA: Insufficient documentation

## 2020-04-13 DIAGNOSIS — E785 Hyperlipidemia, unspecified: Secondary | ICD-10-CM | POA: Diagnosis not present

## 2020-04-13 DIAGNOSIS — I110 Hypertensive heart disease with heart failure: Secondary | ICD-10-CM | POA: Diagnosis not present

## 2020-04-13 DIAGNOSIS — I5022 Chronic systolic (congestive) heart failure: Secondary | ICD-10-CM | POA: Diagnosis not present

## 2020-04-13 DIAGNOSIS — G4733 Obstructive sleep apnea (adult) (pediatric): Secondary | ICD-10-CM | POA: Diagnosis not present

## 2020-04-13 DIAGNOSIS — J90 Pleural effusion, not elsewhere classified: Secondary | ICD-10-CM | POA: Diagnosis not present

## 2020-04-13 DIAGNOSIS — Z79899 Other long term (current) drug therapy: Secondary | ICD-10-CM | POA: Diagnosis not present

## 2020-04-13 DIAGNOSIS — R911 Solitary pulmonary nodule: Secondary | ICD-10-CM | POA: Insufficient documentation

## 2020-04-13 DIAGNOSIS — I712 Thoracic aortic aneurysm, without rupture: Secondary | ICD-10-CM | POA: Insufficient documentation

## 2020-04-13 DIAGNOSIS — R0681 Apnea, not elsewhere classified: Secondary | ICD-10-CM | POA: Diagnosis not present

## 2020-04-13 DIAGNOSIS — Z86711 Personal history of pulmonary embolism: Secondary | ICD-10-CM | POA: Diagnosis not present

## 2020-04-13 DIAGNOSIS — E669 Obesity, unspecified: Secondary | ICD-10-CM | POA: Insufficient documentation

## 2020-04-13 DIAGNOSIS — Z6835 Body mass index (BMI) 35.0-35.9, adult: Secondary | ICD-10-CM | POA: Diagnosis not present

## 2020-04-13 DIAGNOSIS — D6869 Other thrombophilia: Secondary | ICD-10-CM | POA: Diagnosis not present

## 2020-04-13 NOTE — Progress Notes (Signed)
Primary Care Physician: Alroy Dust, L.Marlou Sa, MD Primary Cardiologist: Dr Acie Fredrickson Primary Electrophysiologist: none Referring Physician: Dr Toni Amend is a 59 y.o. male with a history of HTN, chronic systolic CHF, ascending aortic aneurysm, prior PE, and persistent atrial fibrillation who presents for consultation in the [facility].  The patient was initially diagnosed with atrial fibrillation 08/2019 after presenting to urgent care with 4 days of worsening dyspnea. He was found to be in afib and also underwent a CT angiogram which showed an acute PE. He was treated at [facility]. Echo at that time showed an EF of 20-25%. On follow up with Dr Acie Fredrickson, he underwent successful DCCV on 10/20/19. Patient is on Eliquis for a CHADS2VASC score of 3. Repeat echo 11/26/19 showed improved EF 40%. Unfortunately, he was back in afib on follow up 04/06/20. He does notice some mild dyspnea with exertion when in afib. He denies significant alcohol use. He does have a h/o OSA.  Today, he denies symptoms of palpitations, chest pain, orthopnea, PND, lower extremity edema, dizziness, presyncope, syncope, snoring, daytime somnolence, bleeding, or neurologic sequela. The patient is tolerating medications without difficulties and is otherwise without complaint today.    Atrial Fibrillation Risk Factors:  he does have symptoms or diagnosis of sleep apnea. he is not on CPAP therapy. he does not have a history of rheumatic fever. he does not have a history of alcohol use. The patient does have a history of early familial atrial fibrillation or other arrhythmias. Brother and sister have afib.  he has a BMI of Body mass index is 35.86 kg/m.Marland Kitchen Filed Weights   04/13/20 0833  Weight: 110.1 kg    Family History  Problem Relation Age of Onset  . Hypertrophic cardiomyopathy Mother   . Stroke Father   . Hypertension Father   . Hypertension Sister   . Atrial fibrillation  Brother      Atrial Fibrillation Management history:  Previous antiarrhythmic drugs: none Previous cardioversions: 10/20/19 Previous ablations: none CHADS2VASC score: 3 Anticoagulation history: Eliquis   Past Medical History:  Diagnosis Date  . A-fib (HCC)    WITH RVR  . Abnormality of thoracic aorta    4.3 CM ECTATIC ASENDING   . Cardiopathy   . CHF (congestive heart failure) (HCC)    SYSTOLIC  . Hyperlipidemia   . Lung nodule   . Pleural effusion    RIGHT UPPER LOBE AND RIGHT LOWER LOBE    Past Surgical History:  Procedure Laterality Date  . APPENDECTOMY    . CARDIOVERSION N/A 10/20/2019   Procedure: CARDIOVERSION;  Surgeon: Acie Fredrickson Wonda Cheng, MD;  Location: [facility] ENDOSCOPY;  Service: Cardiovascular;  Laterality: N/A;  . VASECTOMY      Current Outpatient Medications  Medication Sig Dispense Refill  . apixaban (ELIQUIS) 5 MG TABS tablet Take 1 tablet (5 mg total) by mouth 2 (two) times daily. 60 tablet 5  . ascorbic acid (CVS VITAMIN C) 500 MG tablet Take 500 mg by mouth daily.    Marland Kitchen atorvastatin (LIPITOR) 40 MG tablet Take 1 tablet (40 mg total) by mouth daily. 90 tablet 3  . Cholecalciferol (VITAMIN D3) 25 MCG (1000 UT) CAPS Take 1,000 Units by mouth daily.     . Glucosamine-Chondroitin (COSAMIN DS PO) Take 2 tablets by mouth daily.    . metoprolol succinate (TOPROL-XL) 100 MG 24 hr tablet Take 1 tablet (100 mg total) by mouth daily. Take with or immediately following a meal. 90 tablet  3  . Multiple Minerals-Vitamins (CAL MAG ZINC +D3 PO) Take 1 tablet by mouth daily.     . Multiple Vitamin (MULTIVITAMIN WITH MINERALS) TABS tablet Take 1 tablet by mouth daily.    . sacubitril-valsartan (ENTRESTO) 49-51 MG Take 1 tablet by mouth 2 (two) times daily. 60 tablet 11   No current facility-administered medications for this encounter.    No Known Allergies  Social History   Socioeconomic History  . Marital status: Married    Spouse name: Not on file  . Number of children:  Not on file  . Years of education: Not on file  . Highest education level: Not on file  Occupational History  . Not on file  Tobacco Use  . Smoking status: Never Smoker  . Smokeless tobacco: Never Used  Substance and Sexual Activity  . Alcohol use: Not Currently  . Drug use: No  . Sexual activity: Not on file  Other Topics Concern  . Not on file  Social History Narrative  . Not on file   Social Determinants of Health   Financial Resource Strain:   . Difficulty of Paying Living Expenses:   Food Insecurity:   . Worried About Charity fundraiser in the Last Year:   . Arboriculturist in the Last Year:   Transportation Needs:   . Film/video editor (Medical):   Marland Kitchen Lack of Transportation (Non-Medical):   Physical Activity:   . Days of Exercise per Week:   . Minutes of Exercise per Session:   Stress:   . Feeling of Stress :   Social Connections:   . Frequency of Communication with Friends and Family:   . Frequency of Social Gatherings with Friends and Family:   . Attends Religious Services:   . Active Member of Clubs or Organizations:   . Attends Archivist Meetings:   Marland Kitchen Marital Status:   Intimate Partner Violence:   . Fear of Current or Ex-Partner:   . Emotionally Abused:   Marland Kitchen Physically Abused:   . Sexually Abused:      ROS- All systems are reviewed and negative except as per the HPI above.  Physical Exam: Vitals:   04/13/20 0833  BP: 136/90  Pulse: 93  Weight: 110.1 kg  Height: 5\' 9"  (1.753 m)    GEN- The patient is well appearing obese male, alert and oriented x 3 today.   Head- normocephalic, atraumatic Eyes-  Sclera clear, conjunctiva pink Ears- hearing intact Oropharynx- clear Neck- supple  Lungs- Clear to ausculation bilaterally, normal work of breathing Heart- irregular rate and rhythm, no murmurs, rubs or gallops  GI- soft, NT, ND, + BS Extremities- no clubbing, cyanosis, or edema MS- no significant deformity or atrophy Skin- no rash  or lesion Psych- euthymic mood, full affect Neuro- strength and sensation are intact  Wt Readings from Last 3 Encounters:  04/13/20 110.1 kg  04/06/20 110.9 kg  12/11/19 110 kg    EKG today demonstrates afib HR 93, QRS 108, QTc 387  Echo 11/26/19 demonstrated  1. Left ventricular ejection fraction, by estimation, is 40%. The left  ventricle has mild to moderately decreased function. The left ventricle  demonstrates global hypokinesis. The left ventricular internal cavity size  was mildly dilated. There is moderate left ventricular hypertrophy. Left ventricular diastolic parameters are consistent with Grade I diastolic dysfunction (impaired relaxation).  2. Right ventricular systolic function is normal. The right ventricular  size is normal. Tricuspid regurgitation signal is inadequate for  assessing  PA pressure.  3. Left atrial size was mildly dilated.  4. Right atrial size was mildly dilated.  5. The mitral valve is normal in structure. No evidence of mitral valve  regurgitation. No evidence of mitral stenosis.  6. The aortic valve is tricuspid. Aortic valve regurgitation is trivial.  No aortic stenosis is present.  7. Aortic dilatation noted. There is mild dilatation of the ascending  aorta measuring 42 mm.  8. The inferior vena cava is normal in size with greater than 50%  respiratory variability, suggesting right atrial pressure of 3 mmHg.   Epic records are reviewed at length today  CHA2DS2-VASc Score = 3  The patient's score is based upon: CHF History: 1 HTN History: 1 Age : 0 Diabetes History: 0 Stroke History: 0 Vascular Disease History: 1 (PE) Gender: 0      ASSESSMENT AND PLAN: 1. Persistent Atrial Fibrillation (ICD10:  I48.19) The patient's CHA2DS2-VASc score is 3, indicating a 3.2% annual risk of stroke.   Patient in rate controlled afib today. We discussed therapeutic options including AAD and ablation. Given possible tachycardia mediated CM, would  favor ablation. Will refer to EP to discuss.   Continue Eliquis 5 mg BID Continue Toprol 100 mg daily  2. Secondary Hypercoagulable State (ICD10:  D68.69) The patient is at significant risk for stroke/thromboembolism based upon his CHA2DS2-VASc Score of 3.  Continue Apixaban (Eliquis).   3. Obesity Body mass index is 35.86 kg/m. Lifestyle modification was discussed at length including regular exercise and weight reduction.  4. Chronic systolic CHF EF 82% No signs or symptoms of fluid overload.  5. HTN Stable, no changes today.  6. OSA The importance of adequate treatment of sleep apnea was discussed today in order to improve our ability to maintain sinus rhythm long term. Patient has not been on CPAP after significant weight loss. ? Need for CPAP. He does report he has not been sleep well lately. H/o snoring and witnessed apnea. Will refer for sleep study.    Follow up with EP to consider ablation.    [facility] [address] [place], [place] [address] [phone] 04/13/2020 6:11 PM

## 2020-04-14 ENCOUNTER — Telehealth (INDEPENDENT_AMBULATORY_CARE_PROVIDER_SITE_OTHER): Payer: BLUE CROSS/BLUE SHIELD | Admitting: Internal Medicine

## 2020-04-14 ENCOUNTER — Encounter: Payer: Self-pay | Admitting: Internal Medicine

## 2020-04-14 ENCOUNTER — Telehealth: Payer: Self-pay | Admitting: Pharmacist

## 2020-04-14 ENCOUNTER — Telehealth: Payer: Self-pay | Admitting: *Deleted

## 2020-04-14 VITALS — Ht 69.0 in | Wt 236.0 lb

## 2020-04-14 DIAGNOSIS — G4733 Obstructive sleep apnea (adult) (pediatric): Secondary | ICD-10-CM

## 2020-04-14 DIAGNOSIS — I4819 Other persistent atrial fibrillation: Secondary | ICD-10-CM

## 2020-04-14 DIAGNOSIS — I428 Other cardiomyopathies: Secondary | ICD-10-CM

## 2020-04-14 NOTE — Progress Notes (Signed)
Electrophysiology TeleHealth Note   Due to national recommendations of social distancing due to [place] 19, Audio/video telehealth visit is felt to be most appropriate for this patient at this time.  See MyChart message from today for patient consent regarding telehealth for [facility].   Date:  04/14/2020   ID:  Jeffrey Griffin, DOB [birth year], MRN [id number]  Location: home Provider location: [facility] Evaluation Performed: New patient consult  PCP:  Alroy Dust, L.Marlou Sa, MD  Cardiologist:  [name], MD Electrophysiologist:  None   Chief Complaint:  afib  History of Present Illness:    Jeffrey Griffin is a 59 y.o. male who presents via audio/video conferencing for a telehealth visit today.   The patient is referred for new consultation regarding afib by Dr Acie Fredrickson and the AF clinic.  He presented 12/20 with acute decompensated heart failure and was found to have afib as the cause.  He was also found to have PTE.   He was placed on toprol. He was started on anticoagulation and underwent cardioversion in February.  He felt better with sinus rhythm.  He returned to afib in June.  Care was interrupted due to lack of insurance. He has not tried AAD therapy.  He has poor exercise tolerance and fatigue. Today, he denies symptoms of palpitations, chest pain, shortness of breath, orthopnea, PND, lower extremity edema, claudication, dizziness, presyncope, syncope, bleeding, or neurologic sequela. The patient is tolerating medications without difficulties and is otherwise without complaint today.     Past Medical History:  Diagnosis Date  . Abnormality of thoracic aorta    4.3 CM ECTATIC ASENDING   . Cardiopathy   . CHF (congestive heart failure) (HCC)    SYSTOLIC  . Hyperlipidemia   . Lung nodule   . Persistent atrial fibrillation (HCC)    WITH RVR  . Pleural effusion    RIGHT UPPER LOBE AND RIGHT LOWER LOBE     Past Surgical History:  Procedure Laterality Date  .  APPENDECTOMY    . CARDIOVERSION N/A 10/20/2019   Procedure: CARDIOVERSION;  Surgeon: Acie Fredrickson Wonda Cheng, MD;  Location[facility] ENDOSCOPY;  Service: Cardiovascular;  Laterality: N/A;  . VASECTOMY      Current Outpatient Medications  Medication Sig Dispense Refill  . apixaban (ELIQUIS) 5 MG TABS tablet Take 1 tablet (5 mg total) by mouth 2 (two) times daily. 60 tablet 5  . ascorbic acid (CVS VITAMIN C) 500 MG tablet Take 500 mg by mouth daily.    Marland Kitchen atorvastatin (LIPITOR) 40 MG tablet Take 1 tablet (40 mg total) by mouth daily. 90 tablet 3  . Cholecalciferol (VITAMIN D3) 25 MCG (1000 UT) CAPS Take 1,000 Units by mouth daily.     . Glucosamine-Chondroitin (COSAMIN DS PO) Take 2 tablets by mouth daily.    . Multiple Minerals-Vitamins (CAL MAG ZINC +D3 PO) Take 1 tablet by mouth daily.     . Multiple Vitamin (MULTIVITAMIN WITH MINERALS) TABS tablet Take 1 tablet by mouth daily.    . sacubitril-valsartan (ENTRESTO) 49-51 MG Take 1 tablet by mouth 2 (two) times daily. 60 tablet 11  . metoprolol succinate (TOPROL-XL) 100 MG 24 hr tablet Take 1 tablet (100 mg total) by mouth daily. Take with or immediately following a meal. 90 tablet 3   No current facility-administered medications for this visit.    Allergies:   Patient has no known allergies.   Social History:  The patient  reports that he has never smoked. He has never used  smokeless tobacco. He reports previous alcohol use. He reports that he does not use drugs.   Family History:  The patient's family history includes Atrial fibrillation in his brother; Hypertension in his father and sister; Hypertrophic cardiomyopathy in his mother; Stroke in his father.   Echo 11/26/19-  EF 40%, mild atrial enlargement  ROS:  Please see the history of present illness.   All other systems are personally reviewed and negative.    Exam:    Vital Signs:  Ht 5\' 9"  (1.753 m)   Wt 236 lb (107 kg)   BMI 34.85 kg/m    Well appearing, alert and conversant, regular  work of breathing,  good skin color Eyes- anicteric, neuro- grossly intact, skin- no apparent rash or lesions or cyanosis, mouth- oral mucosa is pink   Labs/Other Tests and Data Reviewed:    Recent Labs: 10/01/2019: Platelets 243 10/20/2019: Hemoglobin 18.0 03/30/2020: ALT 22; BUN 18; Creatinine, Ser 0.99; Potassium 4.5; Sodium 140   Wt Readings from Last 3 Encounters:  04/14/20 236 lb (107 kg)  04/13/20 242 lb 12.8 oz (110.1 kg)  04/06/20 244 lb 9.6 oz (110.9 kg)     Other studies personally reviewed: Additional studies/ records that were reviewed today include: AF clinic notes,  Dr Lanny Hurst notes, prior echo  Review of the above records today demonstrates: as above   ASSESSMENT & PLAN:    1.  Persistent atrial fibrillation The patient has symptomatic, recurrent persistesnt atrial fibrillation. he has not tried AAD therapy. Chads2vasc score is 2.  he is anticoagulated with eliquis.  He also has a h/o PTE Therapeutic strategies for afib including medicine (tikosyn, amiodarone) and ablation were discussed in detail with the patient today. Risk, benefits, and alternatives to each appraoch were discussed at length.  We have advised that guidelines for afib would advise medicine over ablation as first line for persistent afib. We will schedule admission for tikosyn at the next available time.  2. OSA Not compliant with therapy Sleep study has been ordered  3. Obesity Body mass index is 34.85 kg/m. lifestyle modification is advised  4. Nonischemic CM Likely tachycardia mediated He would do better in sinus   Patient Risk:  after full review of this patients clinical status, I feel that they are at moderate risk at this time.   Today, I have spent 20 minutes with the patient with telehealth technology discussing afib .    Signed, [name] MD, [facility] 04/14/2020 2:39 PM   [address] [place] [address] [phone]  (office) [phone] (fax)

## 2020-04-14 NOTE — Telephone Encounter (Signed)
Medication list reviewed in anticipation of upcoming Tikosyn initiation. Patient is not taking any contraindicated or QTc prolonging medications.   Patient is anticoagulated on Eliquis 5mg BID on the appropriate dose. Please ensure that patient has not missed any anticoagulation doses in the 3 weeks prior to Tikosyn initiation.   Patient will need to be counseled to avoid use of Benadryl while on Tikosyn and in the 2-3 days prior to Tikosyn initiation. 

## 2020-04-14 NOTE — Telephone Encounter (Signed)
  Patient Consent for Virtual Visit         Jeffrey Griffin has provided verbal consent on 04/14/2020 for a virtual visit (video or telephone).   CONSENT FOR VIRTUAL VISIT FOR:  Jeffrey Griffin  By participating in this virtual visit I agree to the following:  I hereby voluntarily request, consent and authorize [facility] and its employed or contracted physicians, physician assistants, nurse practitioners or other licensed health care professionals (the Practitioner), to provide me with telemedicine health care services (the "Services") as deemed necessary by the treating Practitioner. I acknowledge and consent to receive the Services by the Practitioner via telemedicine. I understand that the telemedicine visit will involve communicating with the Practitioner through live audiovisual communication technology and the disclosure of certain medical information by electronic transmission. I acknowledge that I have been given the opportunity to request an in-person assessment or other available alternative prior to the telemedicine visit and am voluntarily participating in the telemedicine visit.  I understand that I have the right to withhold or withdraw my consent to the use of telemedicine in the course of my care at any time, without affecting my right to future care or treatment, and that the Practitioner or I may terminate the telemedicine visit at any time. I understand that I have the right to inspect all information obtained and/or recorded in the course of the telemedicine visit and may receive copies of available information for a reasonable fee.  I understand that some of the potential risks of receiving the Services via telemedicine include:  Marland Kitchen Delay or interruption in medical evaluation due to technological equipment failure or disruption; . Information transmitted may not be sufficient (e.g. poor resolution of images) to allow for appropriate medical decision making by the Practitioner;  and/or  . In rare instances, security protocols could fail, causing a breach of personal health information.  Furthermore, I acknowledge that it is my responsibility to provide information about my medical history, conditions and care that is complete and accurate to the best of my ability. I acknowledge that Practitioner's advice, recommendations, and/or decision may be based on factors not within their control, such as incomplete or inaccurate data provided by me or distortions of diagnostic images or specimens that may result from electronic transmissions. I understand that the practice of medicine is not an exact science and that Practitioner makes no warranties or guarantees regarding treatment outcomes. I acknowledge that a copy of this consent can be made available to me via my patient portal ([facility]), or I can request a printed copy by calling the office of [facility].    I understand that my insurance will be billed for this visit.   I have read or had this consent read to me. . I understand the contents of this consent, which adequately explains the benefits and risks of the Services being provided via telemedicine.  . I have been provided ample opportunity to ask questions regarding this consent and the Services and have had my questions answered to my satisfaction. . I give my informed consent for the services to be provided through the use of telemedicine in my medical care

## 2020-04-20 ENCOUNTER — Other Ambulatory Visit: Payer: Self-pay

## 2020-04-20 ENCOUNTER — Encounter (HOSPITAL_COMMUNITY): Payer: Self-pay

## 2020-04-20 ENCOUNTER — Ambulatory Visit (INDEPENDENT_AMBULATORY_CARE_PROVIDER_SITE_OTHER)
Admission: RE | Admit: 2020-04-20 | Discharge: 2020-04-20 | Disposition: A | Discharge status: Home / Self Care | Payer: BLUE CROSS/BLUE SHIELD | Source: Ambulatory Visit | Attending: Cardiovascular Disease | Admitting: Cardiovascular Disease

## 2020-04-20 DIAGNOSIS — I712 Thoracic aortic aneurysm, without rupture, unspecified: Secondary | ICD-10-CM

## 2020-04-20 DIAGNOSIS — N281 Cyst of kidney, acquired: Secondary | ICD-10-CM | POA: Diagnosis not present

## 2020-04-20 DIAGNOSIS — R911 Solitary pulmonary nodule: Secondary | ICD-10-CM | POA: Diagnosis not present

## 2020-04-20 DIAGNOSIS — M47814 Spondylosis without myelopathy or radiculopathy, thoracic region: Secondary | ICD-10-CM | POA: Diagnosis not present

## 2020-04-20 DIAGNOSIS — R1907 Generalized intra-abdominal and pelvic swelling, mass and lump: Secondary | ICD-10-CM

## 2020-04-20 DIAGNOSIS — N2889 Other specified disorders of kidney and ureter: Secondary | ICD-10-CM | POA: Diagnosis not present

## 2020-04-20 MED ORDER — IOHEXOL 350 MG/ML SOLN
100.0000 mL | Freq: Once | INTRAVENOUS | Status: AC | PRN
  Administered 2020-04-20: 100 mL via INTRAVENOUS

## 2020-04-21 ENCOUNTER — Telehealth: Payer: Self-pay

## 2020-04-21 ENCOUNTER — Telehealth: Payer: Self-pay | Admitting: Cardiovascular Disease

## 2020-04-21 DIAGNOSIS — R911 Solitary pulmonary nodule: Secondary | ICD-10-CM

## 2020-04-21 DIAGNOSIS — I712 Thoracic aortic aneurysm, without rupture, unspecified: Secondary | ICD-10-CM

## 2020-04-21 NOTE — Telephone Encounter (Signed)
-----   Message from Thayer Headings, MD sent at 04/20/2020  3:40 PM EDT ----- His ascending aorta measures 4.0 cm which is smaller than previous CT scan We will continue annual CT angio of his aorta to follow He has a small pulmonary nodule that appears to be stable Please refer to pulmonary nodule clinic to follow  ( the pulmonary clinic can decide whether or not he needs routine screening for this )

## 2020-04-21 NOTE — Telephone Encounter (Signed)
Spoke with patient in regards to CT angio of chest and abdomen results. Patient verbalized understanding.   Patient also has many concerns about tikosyn. He states that he does not know if he will be able to afford this prescription in the future if it is something that he will need to be on for long term. Advised he would need it long term and to check with his insurance company. Patient expresses many concerns about losing his insurance in the future which would require him to come off of Tikosyn due to the cost.  Patient is now thinking he may consider the ablation instead. Advised that I would let Dr. Rayann Heman know of his concerns.

## 2020-04-21 NOTE — Telephone Encounter (Signed)
The patient has been notified of the result and verbalized understanding.  All questions (if any) were answered. Antonieta Iba, RN 04/21/2020 12:28 PM  Referral placed for pulmonology.

## 2020-04-21 NOTE — Telephone Encounter (Signed)
    Pt is returning call from [place] about results. He said the best time to call him is during lunch time, 12 to 1 pm or after 5pm

## 2020-04-26 ENCOUNTER — Telehealth (HOSPITAL_COMMUNITY): Payer: Self-pay | Admitting: Physician Assistant

## 2020-04-26 ENCOUNTER — Other Ambulatory Visit (HOSPITAL_COMMUNITY): Payer: Self-pay | Admitting: *Deleted

## 2020-04-26 MED ORDER — AMIODARONE HCL 200 MG PO TABS: ORAL_TABLET | ORAL | 0 refills | Status: DC

## 2020-04-26 NOTE — Addendum Note (Signed)
Addended by: Antonieta Iba on: 04/26/2020 01:45 PM   Modules accepted: Orders

## 2020-04-26 NOTE — Telephone Encounter (Signed)
Called patient regarding dofetilide admission. Given current COVID-19 resurgence and increased hospitalizations, we discussed foregoing admission. D/w case with Dr Rayann Heman. Will plan to start amiodarone 200 mg BID for now. Could consider ablation in the future. Check ECG in 1-2 weeks. Patient voices understanding and is in agreement with plan.

## 2020-05-07 ENCOUNTER — Ambulatory Visit (HOSPITAL_COMMUNITY): Payer: BLUE CROSS/BLUE SHIELD | Admitting: Physician Assistant

## 2020-05-07 NOTE — Telephone Encounter (Signed)
Pt has appt with afib clinic 05/11/2020.  Will discuss tx options at that time.

## 2020-05-11 ENCOUNTER — Encounter (HOSPITAL_COMMUNITY): Payer: Self-pay | Admitting: Physician Assistant

## 2020-05-11 ENCOUNTER — Other Ambulatory Visit: Payer: Self-pay

## 2020-05-11 ENCOUNTER — Ambulatory Visit (HOSPITAL_COMMUNITY)
Admission: RE | Admit: 2020-05-11 | Discharge: 2020-05-11 | Disposition: A | Discharge status: Home / Self Care | Payer: BLUE CROSS/BLUE SHIELD | Source: Ambulatory Visit | Attending: Physician Assistant | Admitting: Physician Assistant

## 2020-05-11 VITALS — BP 132/90 | HR 65 | Ht 69.0 in | Wt 234.0 lb

## 2020-05-11 DIAGNOSIS — G4733 Obstructive sleep apnea (adult) (pediatric): Secondary | ICD-10-CM | POA: Insufficient documentation

## 2020-05-11 DIAGNOSIS — I712 Thoracic aortic aneurysm, without rupture: Secondary | ICD-10-CM | POA: Insufficient documentation

## 2020-05-11 DIAGNOSIS — E669 Obesity, unspecified: Secondary | ICD-10-CM | POA: Insufficient documentation

## 2020-05-11 DIAGNOSIS — Z79899 Other long term (current) drug therapy: Secondary | ICD-10-CM | POA: Diagnosis not present

## 2020-05-11 DIAGNOSIS — I5022 Chronic systolic (congestive) heart failure: Secondary | ICD-10-CM | POA: Insufficient documentation

## 2020-05-11 DIAGNOSIS — E785 Hyperlipidemia, unspecified: Secondary | ICD-10-CM | POA: Insufficient documentation

## 2020-05-11 DIAGNOSIS — Z7901 Long term (current) use of anticoagulants: Secondary | ICD-10-CM | POA: Insufficient documentation

## 2020-05-11 DIAGNOSIS — Z6834 Body mass index (BMI) 34.0-34.9, adult: Secondary | ICD-10-CM | POA: Insufficient documentation

## 2020-05-11 DIAGNOSIS — I110 Hypertensive heart disease with heart failure: Secondary | ICD-10-CM | POA: Insufficient documentation

## 2020-05-11 DIAGNOSIS — D6869 Other thrombophilia: Secondary | ICD-10-CM | POA: Diagnosis not present

## 2020-05-11 DIAGNOSIS — I4819 Other persistent atrial fibrillation: Secondary | ICD-10-CM

## 2020-05-11 NOTE — Progress Notes (Signed)
Primary Care Physician: Alroy Dust, L.Marlou Sa, MD Primary Cardiologist: Dr Acie Fredrickson Primary Electrophysiologist: Dr Rayann Heman Referring Physician: Dr Acie Fredrickson   Jeffrey Griffin is a 59 y.o. male with a history of HTN, chronic systolic CHF, ascending aortic aneurysm, prior PE, and persistent atrial fibrillation who presents for consultation in the [facility].  The patient was initially diagnosed with atrial fibrillation 08/2019 after presenting to urgent care with 4 days of worsening dyspnea. He was found to be in afib and also underwent a CT angiogram which showed an acute PE. He was treated at [facility]. Echo at that time showed an EF of 20-25%. On follow up with Dr Acie Fredrickson, he underwent successful DCCV on 10/20/19. Patient is on Eliquis for a CHADS2VASC score of 3. Repeat echo 11/26/19 showed improved EF 40%. Unfortunately, he was back in afib on follow up 04/06/20. He does notice some mild dyspnea with exertion when in afib. He denies significant alcohol use. He does have a h/o OSA.  On follow up today, patient reports he has done reasonable well since his last visit. He remains in rate controlled afib. He is tolerating the amiodarone without difficulty. He denies any bleeding issues on anticoagulation.   Today, he denies symptoms of palpitations, chest pain, orthopnea, PND, lower extremity edema, dizziness, presyncope, syncope, snoring, daytime somnolence, bleeding, or neurologic sequela. The patient is tolerating medications without difficulties and is otherwise without complaint today.    Atrial Fibrillation Risk Factors:  he does have symptoms or diagnosis of sleep apnea. he is not on CPAP therapy. he does not have a history of rheumatic fever. he does not have a history of alcohol use. The patient does have a history of early familial atrial fibrillation or other arrhythmias. Brother and sister have afib.  he has a BMI of Body mass index is 34.56 kg/m.Marland Kitchen Filed  Weights   05/11/20 0828  Weight: 106.1 kg    Family History  Problem Relation Age of Onset   Hypertrophic cardiomyopathy Mother    Stroke Father    Hypertension Father    Hypertension Sister    Atrial fibrillation Brother      Atrial Fibrillation Management history:  Previous antiarrhythmic drugs: amiodarone Previous cardioversions: 10/20/19 Previous ablations: none CHADS2VASC score: 3 Anticoagulation history: Eliquis   Past Medical History:  Diagnosis Date   Abnormality of thoracic aorta    4.3 CM ECTATIC ASENDING    Cardiopathy    CHF (congestive heart failure) (HCC)    SYSTOLIC   Hyperlipidemia    Hypertension    Lung nodule    Obesity    Persistent atrial fibrillation (HCC)    WITH RVR   Pleural effusion    RIGHT UPPER LOBE AND RIGHT LOWER LOBE    Past Surgical History:  Procedure Laterality Date   APPENDECTOMY     CARDIOVERSION N/A 10/20/2019   Procedure: CARDIOVERSION;  Surgeon: Nahser, Wonda Cheng, MD;  Location: [facility] ENDOSCOPY;  Service: Cardiovascular;  Laterality: N/A;   VASECTOMY      Current Outpatient Medications  Medication Sig Dispense Refill   amiodarone (PACERONE) 200 MG tablet Take 1 tablet by mouth twice a day for 1 month then reduce to 1 tablet a day 60 tablet 0   apixaban (ELIQUIS) 5 MG TABS tablet Take 1 tablet (5 mg total) by mouth 2 (two) times daily. 60 tablet 5   ascorbic acid (CVS VITAMIN C) 500 MG tablet Take 500 mg by mouth daily.     atorvastatin (LIPITOR)  40 MG tablet Take 1 tablet (40 mg total) by mouth daily. 90 tablet 3   Cholecalciferol (VITAMIN D3) 25 MCG (1000 UT) CAPS Take 1,000 Units by mouth daily.      Glucosamine-Chondroitin (COSAMIN DS PO) Take 2 tablets by mouth daily.     metoprolol succinate (TOPROL-XL) 100 MG 24 hr tablet Take 1 tablet (100 mg total) by mouth daily. Take with or immediately following a meal. 90 tablet 3   Multiple Minerals-Vitamins (CAL MAG ZINC +D3 PO) Take 1 tablet by mouth  daily.      Multiple Vitamin (MULTIVITAMIN WITH MINERALS) TABS tablet Take 1 tablet by mouth daily.     sacubitril-valsartan (ENTRESTO) 49-51 MG Take 1 tablet by mouth 2 (two) times daily. 60 tablet 11   No current facility-administered medications for this encounter.    No Known Allergies  Social History   Socioeconomic History   Marital status: Married    Spouse name: Not on file   Number of children: Not on file   Years of education: Not on file   Highest education level: Not on file  Occupational History   Not on file  Tobacco Use   Smoking status: Never Smoker   Smokeless tobacco: Never Used  Substance and Sexual Activity   Alcohol use: Not Currently   Drug use: No   Sexual activity: Not on file  Other Topics Concern   Not on file  Social History Narrative   Lives in [place].   Social Determinants of Health   Financial Resource Strain:    Difficulty of Paying Living Expenses: Not on file  Food Insecurity:    Worried About Charity fundraiser in the Last Year: Not on file   [employer] of Food in the Last Year: Not on file  Transportation Needs:    Lack of Transportation (Medical): Not on file   Lack of Transportation (Non-Medical): Not on file  Physical Activity:    Days of Exercise per Week: Not on file   Minutes of Exercise per Session: Not on file  Stress:    Feeling of Stress : Not on file  Social Connections:    Frequency of Communication with Friends and Family: Not on file   Frequency of Social Gatherings with Friends and Family: Not on file   Attends Religious Services: Not on file   Active Member of Clubs or Organizations: Not on file   Attends Archivist Meetings: Not on file   Marital Status: Not on file  Intimate Partner Violence:    Fear of Current or Ex-Partner: Not on file   Emotionally Abused: Not on file   Physically Abused: Not on file   Sexually Abused: Not on file     ROS- All systems  are reviewed and negative except as per the HPI above.  Physical Exam: Vitals:   05/11/20 0828  BP: 132/90  Pulse: 65  Weight: 106.1 kg  Height: 5\' 9"  (1.753 m)    GEN- The patient is well appearing, alert and oriented x 3 today.   HEENT-head normocephalic, atraumatic, sclera clear, conjunctiva pink, hearing intact, trachea midline. Lungs- Clear to ausculation bilaterally, normal work of breathing Heart- irregular rate and rhythm, no murmurs, rubs or gallops  GI- soft, NT, ND, + BS Extremities- no clubbing, cyanosis, or edema MS- no significant deformity or atrophy Skin- no rash or lesion Psych- euthymic mood, full affect Neuro- strength and sensation are intact   Wt Readings from Last 3  Encounters:  05/11/20 106.1 kg  04/14/20 107 kg  04/13/20 110.1 kg    EKG today demonstrates afib HR 65, QRS 110, QTc 372  Echo 11/26/19 demonstrated  1. Left ventricular ejection fraction, by estimation, is 40%. The left  ventricle has mild to moderately decreased function. The left ventricle  demonstrates global hypokinesis. The left ventricular internal cavity size  was mildly dilated. There is moderate left ventricular hypertrophy. Left ventricular diastolic parameters are consistent with Grade I diastolic dysfunction (impaired relaxation).  2. Right ventricular systolic function is normal. The right ventricular  size is normal. Tricuspid regurgitation signal is inadequate for assessing  PA pressure.  3. Left atrial size was mildly dilated.  4. Right atrial size was mildly dilated.  5. The mitral valve is normal in structure. No evidence of mitral valve  regurgitation. No evidence of mitral stenosis.  6. The aortic valve is tricuspid. Aortic valve regurgitation is trivial.  No aortic stenosis is present.  7. Aortic dilatation noted. There is mild dilatation of the ascending  aorta measuring 42 mm.  8. The inferior vena cava is normal in size with greater than 50%    respiratory variability, suggesting right atrial pressure of 3 mmHg.   Epic records are reviewed at length today  CHA2DS2-VASc Score = 3  The patient's score is based upon: CHF History: 1 HTN History: 1 Age : 0 Diabetes History: 0 Stroke History: 0 Vascular Disease History: 1 (PE) Gender: 0      ASSESSMENT AND PLAN: 1. Persistent Atrial Fibrillation (ICD10:  I48.19) The patient's CHA2DS2-VASc score is 3, indicating a 3.2% annual risk of stroke.   He remains in rate controlled afib today. Continue amiodarone 200 mg BID for 2 more weeks, then decrease to once daily. Will plan for DCCV if he remains persistent.  Continue Eliquis 5 mg BID Continue Toprol 100 mg daily  2. Secondary Hypercoagulable State (ICD10:  D68.69) The patient is at significant risk for stroke/thromboembolism based upon his CHA2DS2-VASc Score of 3.  Continue Apixaban (Eliquis).   3. Obesity Body mass index is 34.56 kg/m. Lifestyle modification was discussed and encouraged including regular physical activity and weight reduction.  4. Chronic systolic CHF EF 93% No signs or symptoms of fluid overload.  5. HTN Stable, no changes today.  6. OSA Sleep study ordered.   Follow up in the AF clinic for ECG in 2 weeks.    [facility] [address] [place], [place] [address] [phone] 05/11/2020 8:48 AM

## 2020-05-11 NOTE — H&P (View-Only) (Signed)
Primary Care Physician: Alroy Dust, L.Marlou Sa, MD Primary Cardiologist: Dr Acie Fredrickson Primary Electrophysiologist: Dr Rayann Heman Referring Physician: Dr Acie Fredrickson   Jeffrey Griffin is a 59 y.o. male with a history of HTN, chronic systolic CHF, ascending aortic aneurysm, prior PE, and persistent atrial fibrillation who presents for consultation in the [facility].  The patient was initially diagnosed with atrial fibrillation 08/2019 after presenting to urgent care with 4 days of worsening dyspnea. He was found to be in afib and also underwent a CT angiogram which showed an acute PE. He was treated at [facility]. Echo at that time showed an EF of 20-25%. On follow up with Dr Acie Fredrickson, he underwent successful DCCV on 10/20/19. Patient is on Eliquis for a CHADS2VASC score of 3. Repeat echo 11/26/19 showed improved EF 40%. Unfortunately, he was back in afib on follow up 04/06/20. He does notice some mild dyspnea with exertion when in afib. He denies significant alcohol use. He does have a h/o OSA.  On follow up today, patient reports he has done reasonable well since his last visit. He remains in rate controlled afib. He is tolerating the amiodarone without difficulty. He denies any bleeding issues on anticoagulation.   Today, he denies symptoms of palpitations, chest pain, orthopnea, PND, lower extremity edema, dizziness, presyncope, syncope, snoring, daytime somnolence, bleeding, or neurologic sequela. The patient is tolerating medications without difficulties and is otherwise without complaint today.    Atrial Fibrillation Risk Factors:  he does have symptoms or diagnosis of sleep apnea. he is not on CPAP therapy. he does not have a history of rheumatic fever. he does not have a history of alcohol use. The patient does have a history of early familial atrial fibrillation or other arrhythmias. Brother and sister have afib.  he has a BMI of Body mass index is 34.56 kg/m.Marland Kitchen Filed  Weights   05/11/20 0828  Weight: 106.1 kg    Family History  Problem Relation Age of Onset  . Hypertrophic cardiomyopathy Mother   . Stroke Father   . Hypertension Father   . Hypertension Sister   . Atrial fibrillation Brother      Atrial Fibrillation Management history:  Previous antiarrhythmic drugs: amiodarone Previous cardioversions: 10/20/19 Previous ablations: none CHADS2VASC score: 3 Anticoagulation history: Eliquis   Past Medical History:  Diagnosis Date  . Abnormality of thoracic aorta    4.3 CM ECTATIC ASENDING   . Cardiopathy   . CHF (congestive heart failure) (HCC)    SYSTOLIC  . Hyperlipidemia   . Hypertension   . Lung nodule   . Obesity   . Persistent atrial fibrillation (HCC)    WITH RVR  . Pleural effusion    RIGHT UPPER LOBE AND RIGHT LOWER LOBE    Past Surgical History:  Procedure Laterality Date  . APPENDECTOMY    . CARDIOVERSION N/A 10/20/2019   Procedure: CARDIOVERSION;  Surgeon: Acie Fredrickson Wonda Cheng, MD;  Location: [facility] ENDOSCOPY;  Service: Cardiovascular;  Laterality: N/A;  . VASECTOMY      Current Outpatient Medications  Medication Sig Dispense Refill  . amiodarone (PACERONE) 200 MG tablet Take 1 tablet by mouth twice a day for 1 month then reduce to 1 tablet a day 60 tablet 0  . apixaban (ELIQUIS) 5 MG TABS tablet Take 1 tablet (5 mg total) by mouth 2 (two) times daily. 60 tablet 5  . ascorbic acid ([employer] VITAMIN C) 500 MG tablet Take 500 mg by mouth daily.    Marland Kitchen atorvastatin (LIPITOR)  40 MG tablet Take 1 tablet (40 mg total) by mouth daily. 90 tablet 3  . Cholecalciferol (VITAMIN D3) 25 MCG (1000 UT) CAPS Take 1,000 Units by mouth daily.     . Glucosamine-Chondroitin (COSAMIN DS PO) Take 2 tablets by mouth daily.    . metoprolol succinate (TOPROL-XL) 100 MG 24 hr tablet Take 1 tablet (100 mg total) by mouth daily. Take with or immediately following a meal. 90 tablet 3  . Multiple Minerals-Vitamins (CAL MAG ZINC +D3 PO) Take 1 tablet by mouth  daily.     . Multiple Vitamin (MULTIVITAMIN WITH MINERALS) TABS tablet Take 1 tablet by mouth daily.    . sacubitril-valsartan (ENTRESTO) 49-51 MG Take 1 tablet by mouth 2 (two) times daily. 60 tablet 11   No current facility-administered medications for this encounter.    No Known Allergies  Social History   Socioeconomic History  . Marital status: Married    Spouse name: Not on file  . Number of children: Not on file  . Years of education: Not on file  . Highest education level: Not on file  Occupational History  . Not on file  Tobacco Use  . Smoking status: Never Smoker  . Smokeless tobacco: Never Used  Substance and Sexual Activity  . Alcohol use: Not Currently  . Drug use: No  . Sexual activity: Not on file  Other Topics Concern  . Not on file  Social History Narrative   Lives in [place].   Social Determinants of Health   Financial Resource Strain:   . Difficulty of Paying Living Expenses: Not on file  Food Insecurity:   . Worried About Charity fundraiser in the Last Year: Not on file  . Ran Out of Food in the Last Year: Not on file  Transportation Needs:   . Lack of Transportation (Medical): Not on file  . Lack of Transportation (Non-Medical): Not on file  Physical Activity:   . Days of Exercise per Week: Not on file  . Minutes of Exercise per Session: Not on file  Stress:   . Feeling of Stress : Not on file  Social Connections:   . Frequency of Communication with Friends and Family: Not on file  . Frequency of Social Gatherings with Friends and Family: Not on file  . Attends Religious Services: Not on file  . Active Member of Clubs or Organizations: Not on file  . Attends Archivist Meetings: Not on file  . Marital Status: Not on file  Intimate Partner Violence:   . Fear of Current or Ex-Partner: Not on file  . Emotionally Abused: Not on file  . Physically Abused: Not on file  . Sexually Abused: Not on file     ROS- All systems  are reviewed and negative except as per the HPI above.  Physical Exam: Vitals:   05/11/20 0828  BP: 132/90  Pulse: 65  Weight: 106.1 kg  Height: 5\' 9"  (1.753 m)    GEN- The patient is well appearing, alert and oriented x 3 today.   HEENT-head normocephalic, atraumatic, sclera clear, conjunctiva pink, hearing intact, trachea midline. Lungs- Clear to ausculation bilaterally, normal work of breathing Heart- irregular rate and rhythm, no murmurs, rubs or gallops  GI- soft, NT, ND, + BS Extremities- no clubbing, cyanosis, or edema MS- no significant deformity or atrophy Skin- no rash or lesion Psych- euthymic mood, full affect Neuro- strength and sensation are intact   Wt Readings from Last 3  Encounters:  05/11/20 106.1 kg  04/14/20 107 kg  04/13/20 110.1 kg    EKG today demonstrates afib HR 65, QRS 110, QTc 372  Echo 11/26/19 demonstrated  1. Left ventricular ejection fraction, by estimation, is 40%. The left  ventricle has mild to moderately decreased function. The left ventricle  demonstrates global hypokinesis. The left ventricular internal cavity size  was mildly dilated. There is moderate left ventricular hypertrophy. Left ventricular diastolic parameters are consistent with Grade I diastolic dysfunction (impaired relaxation).  2. Right ventricular systolic function is normal. The right ventricular  size is normal. Tricuspid regurgitation signal is inadequate for assessing  PA pressure.  3. Left atrial size was mildly dilated.  4. Right atrial size was mildly dilated.  5. The mitral valve is normal in structure. No evidence of mitral valve  regurgitation. No evidence of mitral stenosis.  6. The aortic valve is tricuspid. Aortic valve regurgitation is trivial.  No aortic stenosis is present.  7. Aortic dilatation noted. There is mild dilatation of the ascending  aorta measuring 42 mm.  8. The inferior vena cava is normal in size with greater than 50%    respiratory variability, suggesting right atrial pressure of 3 mmHg.   Epic records are reviewed at length today  CHA2DS2-VASc Score = 3  The patient's score is based upon: CHF History: 1 HTN History: 1 Age : 0 Diabetes History: 0 Stroke History: 0 Vascular Disease History: 1 (PE) Gender: 0      ASSESSMENT AND PLAN: 1. Persistent Atrial Fibrillation (ICD10:  I48.19) The patient's CHA2DS2-VASc score is 3, indicating a 3.2% annual risk of stroke.   He remains in rate controlled afib today. Continue amiodarone 200 mg BID for 2 more weeks, then decrease to once daily. Will plan for DCCV if he remains persistent.  Continue Eliquis 5 mg BID Continue Toprol 100 mg daily  2. Secondary Hypercoagulable State (ICD10:  D68.69) The patient is at significant risk for stroke/thromboembolism based upon his CHA2DS2-VASc Score of 3.  Continue Apixaban (Eliquis).   3. Obesity Body mass index is 34.56 kg/m. Lifestyle modification was discussed and encouraged including regular physical activity and weight reduction.  4. Chronic systolic CHF EF 59% No signs or symptoms of fluid overload.  5. HTN Stable, no changes today.  6. OSA Sleep study ordered.   Follow up in the AF clinic for ECG in 2 weeks.    [facility] [address] [place], [place] [address] [phone] 05/11/2020 8:48 AM

## 2020-05-18 ENCOUNTER — Other Ambulatory Visit (HOSPITAL_COMMUNITY): Payer: Self-pay | Admitting: Physician Assistant

## 2020-05-26 ENCOUNTER — Ambulatory Visit (HOSPITAL_COMMUNITY)
Admission: RE | Admit: 2020-05-26 | Discharge: 2020-05-26 | Disposition: A | Discharge status: Home / Self Care | Payer: BLUE CROSS/BLUE SHIELD | Source: Ambulatory Visit | Attending: Physician Assistant | Admitting: Physician Assistant

## 2020-05-26 ENCOUNTER — Other Ambulatory Visit: Payer: Self-pay

## 2020-05-26 VITALS — BP 122/80 | HR 130

## 2020-05-26 DIAGNOSIS — I4892 Unspecified atrial flutter: Secondary | ICD-10-CM | POA: Insufficient documentation

## 2020-05-26 DIAGNOSIS — I484 Atypical atrial flutter: Secondary | ICD-10-CM

## 2020-05-26 DIAGNOSIS — Z79899 Other long term (current) drug therapy: Secondary | ICD-10-CM | POA: Insufficient documentation

## 2020-05-26 DIAGNOSIS — D6869 Other thrombophilia: Secondary | ICD-10-CM

## 2020-05-26 LAB — CBC
HCT: 53.9 % — ABNORMAL HIGH (ref 39.0–52.0)
Hemoglobin: 17.1 g/dL — ABNORMAL HIGH (ref 13.0–17.0)
MCH: 30.3 pg (ref 26.0–34.0)
MCHC: 31.7 g/dL (ref 30.0–36.0)
MCV: 95.4 fL (ref 80.0–100.0)
Platelets: 234 10*3/uL (ref 150–400)
RBC: 5.65 MIL/uL (ref 4.22–5.81)
RDW: 12.1 % (ref 11.5–15.5)
WBC: 7.5 10*3/uL (ref 4.0–10.5)
nRBC: 0 % (ref 0.0–0.2)

## 2020-05-26 LAB — BASIC METABOLIC PANEL
Anion gap: 13 (ref 5–15)
BUN: 22 mg/dL — ABNORMAL HIGH (ref 6–20)
CO2: 23 mmol/L (ref 22–32)
Calcium: 10.3 mg/dL (ref 8.9–10.3)
Chloride: 103 mmol/L (ref 98–111)
Creatinine, Ser: 1.15 mg/dL (ref 0.61–1.24)
GFR calc Af Amer: >60 mL/min (ref >60)
GFR calc non Af Amer: >60 mL/min (ref >60)
Glucose, Bld: 102 mg/dL — ABNORMAL HIGH (ref 70–99)
Potassium: 5 mmol/L (ref 3.5–5.1)
Sodium: 139 mmol/L (ref 135–145)

## 2020-05-26 NOTE — Progress Notes (Signed)
Patient returns for ECG today. ECG shows atrial flutter with 2:1 conduction HR 130, QRS 106, QTc 612 (difficult to determine in flutter). Patient symptomatically unchanged with mild dyspnea on exertion although he is able to exercise 1 hour daily. Will arrange for DCCV. Check CBC/bmet. Decrease amiodarone to 200 mg daily now that he has loaded for one month. Follow up 1-2 weeks post DCCV.

## 2020-05-26 NOTE — Patient Instructions (Signed)
Cardioversion scheduled for Monday. September 27th  - Arrive at the [facility] and go to admitting at [facility] not eat or drink anything after midnight the night prior to your procedure.  - Take all your morning medication (except diabetic medications) with a sip of water prior to arrival.  - You will not be able to drive home after your procedure.  - Do NOT miss any doses of your blood thinner - if you should miss a dose please notify our office immediately.  - If you feel as if you go back into normal rhythm prior to scheduled cardioversion, please notify our office immediately. If your procedure is canceled in the cardioversion suite you will be charged a cancellation fee.  Decrease Amiodarone 200mg  once a day

## 2020-05-26 NOTE — H&P (View-Only) (Signed)
Patient returns for ECG today. ECG shows atrial flutter with 2:1 conduction HR 130, QRS 106, QTc 612 (difficult to determine in flutter). Patient symptomatically unchanged with mild dyspnea on exertion although he is able to exercise 1 hour daily. Will arrange for DCCV. Check CBC/bmet. Decrease amiodarone to 200 mg daily now that he has loaded for one month. Follow up 1-2 weeks post DCCV.

## 2020-05-28 ENCOUNTER — Other Ambulatory Visit (HOSPITAL_COMMUNITY)
Admission: RE | Admit: 2020-05-28 | Discharge: 2020-05-28 | Disposition: A | Discharge status: Home / Self Care | Payer: BLUE CROSS/BLUE SHIELD | Source: Ambulatory Visit | Attending: Cardiovascular Disease | Admitting: Cardiovascular Disease

## 2020-05-28 DIAGNOSIS — Z20822 Contact with and (suspected) exposure to covid-19: Secondary | ICD-10-CM | POA: Diagnosis not present

## 2020-05-28 DIAGNOSIS — Z01812 Encounter for preprocedural laboratory examination: Secondary | ICD-10-CM | POA: Insufficient documentation

## 2020-05-28 LAB — SARS CORONAVIRUS 2 (TAT 6-24 HRS): SARS Coronavirus 2: NEGATIVE

## 2020-05-31 ENCOUNTER — Ambulatory Visit (HOSPITAL_COMMUNITY)
Admission: RE | Admit: 2020-05-31 | Discharge: 2020-05-31 | Disposition: A | Discharge status: Home / Self Care | Payer: BLUE CROSS/BLUE SHIELD | Attending: Cardiovascular Disease | Admitting: Cardiovascular Disease

## 2020-05-31 ENCOUNTER — Other Ambulatory Visit: Payer: Self-pay

## 2020-05-31 ENCOUNTER — Ambulatory Visit (HOSPITAL_COMMUNITY): Payer: BLUE CROSS/BLUE SHIELD | Admitting: Certified Registered"

## 2020-05-31 ENCOUNTER — Encounter (HOSPITAL_COMMUNITY)
Admission: RE | Disposition: A | Discharge status: Home / Self Care | Payer: Self-pay | Source: Home / Self Care | Attending: Cardiovascular Disease

## 2020-05-31 DIAGNOSIS — Z6834 Body mass index (BMI) 34.0-34.9, adult: Secondary | ICD-10-CM | POA: Insufficient documentation

## 2020-05-31 DIAGNOSIS — D6869 Other thrombophilia: Secondary | ICD-10-CM | POA: Insufficient documentation

## 2020-05-31 DIAGNOSIS — E669 Obesity, unspecified: Secondary | ICD-10-CM | POA: Diagnosis not present

## 2020-05-31 DIAGNOSIS — I714 Abdominal aortic aneurysm, without rupture: Secondary | ICD-10-CM | POA: Diagnosis not present

## 2020-05-31 DIAGNOSIS — E785 Hyperlipidemia, unspecified: Secondary | ICD-10-CM | POA: Diagnosis not present

## 2020-05-31 DIAGNOSIS — I5022 Chronic systolic (congestive) heart failure: Secondary | ICD-10-CM | POA: Diagnosis not present

## 2020-05-31 DIAGNOSIS — I4891 Unspecified atrial fibrillation: Secondary | ICD-10-CM | POA: Diagnosis not present

## 2020-05-31 DIAGNOSIS — Z79899 Other long term (current) drug therapy: Secondary | ICD-10-CM | POA: Insufficient documentation

## 2020-05-31 DIAGNOSIS — I484 Atypical atrial flutter: Secondary | ICD-10-CM | POA: Diagnosis not present

## 2020-05-31 DIAGNOSIS — I5042 Chronic combined systolic (congestive) and diastolic (congestive) heart failure: Secondary | ICD-10-CM | POA: Diagnosis not present

## 2020-05-31 DIAGNOSIS — G4733 Obstructive sleep apnea (adult) (pediatric): Secondary | ICD-10-CM | POA: Diagnosis not present

## 2020-05-31 DIAGNOSIS — Z7901 Long term (current) use of anticoagulants: Secondary | ICD-10-CM | POA: Diagnosis not present

## 2020-05-31 DIAGNOSIS — I4819 Other persistent atrial fibrillation: Secondary | ICD-10-CM | POA: Diagnosis not present

## 2020-05-31 DIAGNOSIS — I110 Hypertensive heart disease with heart failure: Secondary | ICD-10-CM | POA: Insufficient documentation

## 2020-05-31 HISTORY — PX: CARDIOVERSION: SHX1299

## 2020-05-31 LAB — POCT I-STAT, CHEM 8
BUN: 16 mg/dL (ref 6–20)
Calcium, Ion: 1.34 mmol/L (ref 1.15–1.40)
Chloride: 103 mmol/L (ref 98–111)
Creatinine, Ser: 1.1 mg/dL (ref 0.61–1.24)
Glucose, Bld: 122 mg/dL — ABNORMAL HIGH (ref 70–99)
HCT: 52 % (ref 39.0–52.0)
Hemoglobin: 17.7 g/dL — ABNORMAL HIGH (ref 13.0–17.0)
Potassium: 5.1 mmol/L (ref 3.5–5.1)
Sodium: 141 mmol/L (ref 135–145)
TCO2: 27 mmol/L (ref 22–32)

## 2020-05-31 SURGERY — CARDIOVERSION
Anesthesia: General

## 2020-05-31 MED ORDER — SODIUM CHLORIDE 0.9 % IV SOLN: Freq: Once | INTRAVENOUS | Status: AC

## 2020-05-31 MED ORDER — PROPOFOL 10 MG/ML IV BOLUS
INTRAVENOUS | Status: DC | PRN
  Administered 2020-05-31: 20 mg via INTRAVENOUS
  Administered 2020-05-31: 10 mg via INTRAVENOUS
  Administered 2020-05-31: 50 mg via INTRAVENOUS
  Administered 2020-05-31: 20 mg via INTRAVENOUS

## 2020-05-31 NOTE — Interval H&P Note (Signed)
History and Physical Interval Note:  05/31/2020 9:06 AM  Jeffrey Griffin  has presented today for surgery, with the diagnosis of AFIB.  The various methods of treatment have been discussed with the patient and family. After consideration of risks, benefits and other options for treatment, the patient has consented to  Procedure(s): CARDIOVERSION (N/A) as a surgical intervention.  The patient's history has been reviewed, patient examined, no change in status, stable for surgery.  I have reviewed the patient's chart and labs.  Questions were answered to the patient's satisfaction.     [name]

## 2020-05-31 NOTE — Interval H&P Note (Signed)
History and Physical Interval Note:  05/31/2020 8:18 AM  Jeffrey Griffin  has presented today for surgery, with the diagnosis of AFIB.  The various methods of treatment have been discussed with the patient and family. After consideration of risks, benefits and other options for treatment, the patient has consented to  Procedure(s): CARDIOVERSION (N/A) as a surgical intervention.  The patient's history has been reviewed, patient examined, no change in status, stable for surgery.  I have reviewed the patient's chart and labs.  Questions were answered to the patient's satisfaction.     [name]

## 2020-05-31 NOTE — Transfer of Care (Signed)
Immediate Anesthesia Transfer of Care Note  Patient: Jeffrey Griffin  Procedure(s) Performed: CARDIOVERSION (N/A )  Patient Location: Endoscopy Unit  Anesthesia Type:General  Level of Consciousness: awake, alert  and oriented  Airway & Oxygen Therapy: Patient connected to nasal cannula oxygen  Post-op Assessment: Post -op Vital signs reviewed and stable  Post vital signs: stable  Last Vitals:  Vitals Value Taken Time  BP    Temp    Pulse    Resp    SpO2      Last Pain:  Vitals:   05/31/20 0828  TempSrc: Oral         Complications: No complications documented.

## 2020-05-31 NOTE — Anesthesia Preprocedure Evaluation (Addendum)
Anesthesia Evaluation  Patient identified by MRN, date of birth, ID band Patient awake    Reviewed: Allergy & Precautions, NPO status , Patient's Chart, lab work & pertinent test results  History of Anesthesia Complications Negative for: history of anesthetic complications  Airway Mallampati: III  TM Distance: >3 FB Neck ROM: Full    Dental  (+) Dental Advisory Given, Teeth Intact   Pulmonary neg pulmonary ROS,  Covid-19 Nucleic Acid Test Results Lab Results      Component                Value               Date                      SARSCOV2NAA              NEGATIVE            05/28/2020                [place]              NEGATIVE            11/22/2019                [place]              NEGATIVE            10/18/2019              breath sounds clear to auscultation       Cardiovascular hypertension, Pt. on medications +CHF  + dysrhythmias Atrial Fibrillation  Rhythm:Irregular  1. Left ventricular ejection fraction, by estimation, is 40%. The left  ventricle has mild to moderately decreased function. The left ventricle  demonstrates global hypokinesis. The left ventricular internal cavity size  was mildly dilated. There is  moderate left ventricular hypertrophy. Left ventricular diastolic  parameters are consistent with Grade I diastolic dysfunction (impaired  relaxation).  2. Right ventricular systolic function is normal. The right ventricular  size is normal. Tricuspid regurgitation signal is inadequate for assessing  PA pressure.  3. Left atrial size was mildly dilated.  4. Right atrial size was mildly dilated.  5. The mitral valve is normal in structure. No evidence of mitral valve  regurgitation. No evidence of mitral stenosis.  6. The aortic valve is tricuspid. Aortic valve regurgitation is trivial.  No aortic stenosis is present.  7. Aortic dilatation noted. There is mild dilatation of the ascending   aorta measuring 42 mm.  8. The inferior vena cava is normal in size with greater than 50%  respiratory variability, suggesting right atrial pressure of 3 mmHg.    Neuro/Psych negative neurological ROS  negative psych ROS   GI/Hepatic negative GI ROS, Neg liver ROS,   Endo/Other    Renal/GU negative Renal ROS     Musculoskeletal negative musculoskeletal ROS (+)   Abdominal   Peds  Hematology negative hematology ROS (+) Lab Results      Component                Value               Date                      WBC  7.5                 05/26/2020                HGB                      17.7 (H)            05/31/2020                HCT                      52.0                05/31/2020                MCV                      95.4                05/26/2020                PLT                      234                 05/26/2020              Anesthesia Other Findings   Reproductive/Obstetrics                            Anesthesia Physical Anesthesia Plan  ASA: III  Anesthesia Plan: General   Post-op Pain Management:    Induction: Intravenous  PONV Risk Score and Plan: 2 and Treatment may vary due to age or medical condition  Airway Management Planned: Mask  Additional Equipment: None  Intra-op Plan:   Post-operative Plan:   Informed Consent: I have reviewed the patients History and Physical, chart, labs and discussed the procedure including the risks, benefits and alternatives for the proposed anesthesia with the patient or authorized representative who has indicated his/her understanding and acceptance.     Dental advisory given  Plan Discussed with: CRNA  Anesthesia Plan Comments:         Anesthesia Quick Evaluation

## 2020-05-31 NOTE — Discharge Instructions (Signed)
Electrical Cardioversion Electrical cardioversion is the delivery of a jolt of electricity to restore a normal rhythm to the heart. A rhythm that is too fast or is not regular keeps the heart from pumping well. In this procedure, sticky patches or metal paddles are placed on the chest to deliver electricity to the heart from a device. This procedure may be done in an emergency if:  There is low or no blood pressure as a result of the heart rhythm.  Normal rhythm must be restored as fast as possible to protect the brain and heart from further damage.  It may save a life. This may also be a scheduled procedure for irregular or fast heart rhythms that are not immediately life-threatening. Tell a health care provider about:  Any allergies you have.  All medicines you are taking, including vitamins, herbs, eye drops, creams, and over-the-counter medicines.  Any problems you or family members have had with anesthetic medicines.  Any blood disorders you have.  Any surgeries you have had.  Any medical conditions you have.  Whether you are pregnant or may be pregnant. What are the risks? Generally, this is a safe procedure. However, problems may occur, including:  Allergic reactions to medicines.  A blood clot that breaks free and travels to other parts of your body.  The possible return of an abnormal heart rhythm within hours or days after the procedure.  Your heart stopping (cardiac arrest). This is rare. What happens before the procedure? Medicines  Your health care provider may have you start taking: ? Blood-thinning medicines (anticoagulants) so your blood does not clot as easily. ? Medicines to help stabilize your heart rate and rhythm.  Ask your health care provider about: ? Changing or stopping your regular medicines. This is especially important if you are taking diabetes medicines or blood thinners. ? Taking medicines such as aspirin and ibuprofen. These medicines can  thin your blood. Do not take these medicines unless your health care provider tells you to take them. ? Taking over-the-counter medicines, vitamins, herbs, and supplements. General instructions  Follow instructions from your health care provider about eating or drinking restrictions.  Plan to have someone take you home from the hospital or clinic.  If you will be going home right after the procedure, plan to have someone with you for 24 hours.  Ask your health care provider what steps will be taken to help prevent infection. These may include washing your skin with a germ-killing soap. What happens during the procedure?   An IV will be inserted into one of your veins.  Sticky patches (electrodes) or metal paddles may be placed on your chest.  You will be given a medicine to help you relax (sedative).  An electrical shock will be delivered. The procedure may vary among health care providers and hospitals. What can I expect after the procedure?  Your blood pressure, heart rate, breathing rate, and blood oxygen level will be monitored until you leave the hospital or clinic.  Your heart rhythm will be watched to make sure it does not change.  You may have some redness on the skin where the shocks were given. Follow these instructions at home:  Do not drive for 24 hours if you were given a sedative during your procedure.  Take over-the-counter and prescription medicines only as told by your health care provider.  Ask your health care provider how to check your pulse. Check it often.  Rest for 48 hours after the procedure or   as told by your health care provider.  Avoid or limit your caffeine use as told by your health care provider.  Keep all follow-up visits as told by your health care provider. This is important. Contact a health care provider if:  You feel like your heart is beating too quickly or your pulse is not regular.  You have a serious muscle cramp that does not go  away. Get help right away if:  You have discomfort in your chest.  You are dizzy or you feel faint.  You have trouble breathing or you are short of breath.  Your speech is slurred.  You have trouble moving an arm or leg on one side of your body.  Your fingers or toes turn cold or blue. Summary  Electrical cardioversion is the delivery of a jolt of electricity to restore a normal rhythm to the heart.  This procedure may be done right away in an emergency or may be a scheduled procedure if the condition is not an emergency.  Generally, this is a safe procedure.  After the procedure, check your pulse often as told by your health care provider. This information is not intended to replace advice given to you by your health care provider. Make sure you discuss any questions you have with your health care provider. Document Revised: 03/24/2019 Document Reviewed: 03/24/2019 Elsevier Patient Education  2020 [employer]  

## 2020-05-31 NOTE — CV Procedure (Signed)
[facility]: Anesthesia:  Dr Ermalene Postin - Propofol  [place] x 4 one at 150J and 3 at 200J sync biphasic last 2 with manual AP compression  Failed to convert to NSR  F/U with Dr Rayann Heman and Afib clinic for further Rx  [name] MD [facility]

## 2020-06-01 ENCOUNTER — Encounter (HOSPITAL_COMMUNITY): Payer: Self-pay | Admitting: Cardiovascular Disease

## 2020-06-01 NOTE — Anesthesia Postprocedure Evaluation (Signed)
Anesthesia Post Note  Patient: Jeffrey Griffin  Procedure(s) Performed: CARDIOVERSION (N/A )     Patient location during evaluation: Endoscopy Anesthesia Type: General Level of consciousness: awake and alert Pain management: pain level controlled Vital Signs Assessment: post-procedure vital signs reviewed and stable Respiratory status: spontaneous breathing, nonlabored ventilation, respiratory function stable and patient connected to nasal cannula oxygen Cardiovascular status: blood pressure returned to baseline and stable Postop Assessment: no apparent nausea or vomiting Anesthetic complications: no   No complications documented.  Last Vitals:  Vitals:   05/31/20 1028 05/31/20 1036  BP: 120/84 (!) 128/98  Pulse: (!) 49 (!) 47  Resp: 12 16  Temp:    SpO2: 99% 98%    Last Pain:  Vitals:   05/31/20 1036  TempSrc:   PainSc: 0-No pain                 Quinto Tippy

## 2020-06-02 ENCOUNTER — Ambulatory Visit: Payer: BLUE CROSS/BLUE SHIELD | Admitting: Pulmonary Disease

## 2020-06-02 ENCOUNTER — Encounter: Payer: Self-pay | Admitting: Pulmonary Disease

## 2020-06-02 ENCOUNTER — Other Ambulatory Visit: Payer: Self-pay

## 2020-06-02 VITALS — BP 138/82 | HR 78 | Temp 98.4°F | Ht 69.0 in | Wt 232.0 lb

## 2020-06-02 DIAGNOSIS — Z86711 Personal history of pulmonary embolism: Secondary | ICD-10-CM

## 2020-06-02 DIAGNOSIS — R911 Solitary pulmonary nodule: Secondary | ICD-10-CM | POA: Diagnosis not present

## 2020-06-02 NOTE — Progress Notes (Deleted)
@Patient  ID: Jeffrey Griffin, male    DOB: [birth year], 59 y.o.   MRN: [id number]  Chief Complaint  Patient presents with  . Consult    SOB with activity    Referring provider: Nahser, Wonda Cheng, MD  HPI:   PMH:  Smoker/ Smoking History:  Maintenance:   Pt of:   06/02/2020  - Visit     Questionaires / Pulmonary Flowsheets:   ACT:  No flowsheet data found.  MMRC: No flowsheet data found.  Epworth:  No flowsheet data found.  Tests:   FENO:  No results found for: NITRICOXIDE  PFT: No flowsheet data found.  WALK:  No flowsheet data found.  Imaging: No results found.  Lab Results:  CBC    Component Value Date/Time   WBC 7.5 05/26/2020 0859   RBC 5.65 05/26/2020 0859   HGB 17.7 (H) 05/31/2020 0830   HGB 16.3 10/01/2019 1558   HCT 52.0 05/31/2020 0830   HCT 49.5 10/01/2019 1558   PLT 234 05/26/2020 0859   PLT 243 10/01/2019 1558   MCV 95.4 05/26/2020 0859   MCV 90 10/01/2019 1558   MCH 30.3 05/26/2020 0859   MCHC 31.7 05/26/2020 0859   RDW 12.1 05/26/2020 0859   RDW 12.2 10/01/2019 1558   LYMPHSABS 1.4 08/14/2015 1015   MONOABS 0.6 08/14/2015 1015   EOSABS 0.2 08/14/2015 1015   BASOSABS 0.1 08/14/2015 1015    BMET    Component Value Date/Time   NA 141 05/31/2020 0830   NA 140 03/30/2020 0742   K 5.1 05/31/2020 0830   CL 103 05/31/2020 0830   CO2 23 05/26/2020 0859   GLUCOSE 122 (H) 05/31/2020 0830   BUN 16 05/31/2020 0830   BUN 18 03/30/2020 0742   CREATININE 1.10 05/31/2020 0830   CREATININE 0.80 08/14/2015 1015   CALCIUM 10.3 05/26/2020 0859   GFRNONAA >60 05/26/2020 0859   GFRNONAA >89 08/14/2015 1015   GFRAA >60 05/26/2020 0859   GFRAA >89 08/14/2015 1015    BNP No results found for: BNP  ProBNP No results found for: PROBNP  Specialty Problems    None      No Known Allergies  Immunization History  Administered Date(s) Administered  . Influenza-Unspecified 07/06/2019, 04/07/2020  . Td 08/04/2015    Past Medical  History:  Diagnosis Date  . Abnormality of thoracic aorta    4.3 CM ECTATIC ASENDING   . Cardiopathy   . CHF (congestive heart failure) (HCC)    SYSTOLIC  . Hyperlipidemia   . Hypertension   . Lung nodule   . Obesity   . Persistent atrial fibrillation (HCC)    WITH RVR  . Pleural effusion    RIGHT UPPER LOBE AND RIGHT LOWER LOBE     Tobacco History: Social History   Tobacco Use  Smoking Status Never Smoker  Smokeless Tobacco Never Used   Counseling given: Not Answered   Continue to not smoke  Outpatient Encounter Medications as of 06/02/2020  Medication Sig  . amiodarone (PACERONE) 200 MG tablet Take 1 tablet by mouth once daily (Patient taking differently: Take 200 mg by mouth daily. )  . apixaban (ELIQUIS) 5 MG TABS tablet Take 1 tablet (5 mg total) by mouth 2 (two) times daily.  Marland Kitchen ascorbic acid ([employer] VITAMIN C) 500 MG tablet Take 500 mg by mouth daily.  Marland Kitchen atorvastatin (LIPITOR) 40 MG tablet Take 1 tablet (40 mg total) by mouth daily.  . Cholecalciferol (VITAMIN D3) 25 MCG (1000 UT) CAPS  Take 1,000 Units by mouth daily.   . Glucosamine-Chondroitin (COSAMIN DS PO) Take 2 tablets by mouth daily.  . Multiple Minerals-Vitamins (CAL MAG ZINC +D3 PO) Take 1 tablet by mouth daily.   . Multiple Vitamin (MULTIVITAMIN WITH MINERALS) TABS tablet Take 1 tablet by mouth daily.  . sacubitril-valsartan (ENTRESTO) 49-51 MG Take 1 tablet by mouth 2 (two) times daily.  . metoprolol succinate (TOPROL-XL) 100 MG 24 hr tablet Take 1 tablet (100 mg total) by mouth daily. Take with or immediately following a meal.   No facility-administered encounter medications on file as of 06/02/2020.     Review of Systems  Review of Systems   Physical Exam  BP 138/82 (BP Location: Right Arm, Cuff Size: Normal)   Pulse 78   Temp 98.4 F (36.9 C) (Oral)   Ht 5\' 9"  (1.753 m)   Wt 232 lb (105.2 kg)   SpO2 98%   BMI 34.26 kg/m   Wt Readings from Last 5 Encounters:  06/02/20 232 lb (105.2 kg)    05/31/20 234 lb (106.1 kg)  05/11/20 234 lb (106.1 kg)  04/14/20 236 lb (107 kg)  04/13/20 242 lb 12.8 oz (110.1 kg)    BMI Readings from Last 5 Encounters:  06/02/20 34.26 kg/m  05/31/20 34.56 kg/m  05/11/20 34.56 kg/m  04/14/20 34.85 kg/m  04/13/20 35.86 kg/m     Physical Exam    Assessment & Plan:   No problem-specific Assessment & Plan notes found for this encounter.    No follow-ups on file.   Lanier Clam, MD 06/02/2020   This appointment required *** minutes of patient care (this includes precharting, chart review, review of results, face-to-face care, etc.).

## 2020-06-02 NOTE — Progress Notes (Signed)
Patient ID: Jeffrey Griffin, male    DOB: 1961/02/22, 59 y.o.   MRN: [id number]  Chief Complaint  Patient presents with  . Consult    SOB with activity    Referring provider: Nahser, Wonda Cheng, MD  HPI:   Jeffrey Griffin is a 59 year old man who we are seen in consultation at request of [name] MD for evaluation of pulmonary nodule.  Notes from referring provider reviewed.  In the beginning of 2021, patient had several days of dyspnea.  Mild trouble breathing when sleeping.  Notes very shallow breathing when awakening from his sleep short of breath.  Very fatigued.  After about 3 days presented to the ED.  There he was noted to have elevated D-dimer and subsequent CTA chest PE protocol demonstrated small right upper and right lower subsegmental pulmonary emboli.  He was placed on Eliquis.  Symptoms gradually improved.  It was noted on that CTA but there were is a small 4 mm right lower lobe nodule with report dictating no follow-up with low risk individual versus Thoma follow-up for high risk individual per Fleischner guidelines.  He is being followed by cardiology for a thoracic aneurysm.  A CTA to monitor this was obtained August 2021 which redemonstrated stable formula right lower lobe nodule.  This prompted referral.  Will monitor patient of CTA 04/2020 there is a small approximately 4 mm peripheral right lower lobe nodule that is well-circumscribed without spiculation or tethering.  Additionally, lungs appear clear and no evidence of pulmonary embolus seen on my review.  He has no history of cancer.  Denies any respiratory complaint.  No cough.  He has not been given very much information about the spot in the lungs and is anxious to learn more.  PMH: Hypertension, A. fib, PE Surgical history: Reviewed, none Family history: Seasonal allergies, asthma, coronary disease, no history of lung cancer in first-degree relatives Social history: Never smoker, rarely had drinks alcohol, [employer],  lives in [place], moved from [place] in 2016   Questionaires / Pulmonary Flowsheets:   ACT:  No flowsheet data found.  MMRC: No flowsheet data found.  Epworth:  No flowsheet data found.  Tests:   FENO:  No results found for: NITRICOXIDE  PFT: No flowsheet data found.  WALK:  No flowsheet data found.  Imaging: No results found.  Lab Results:  CBC    Component Value Date/Time   WBC 7.5 05/26/2020 0859   RBC 5.65 05/26/2020 0859   HGB 17.7 (H) 05/31/2020 0830   HGB 16.3 10/01/2019 1558   HCT 52.0 05/31/2020 0830   HCT 49.5 10/01/2019 1558   PLT 234 05/26/2020 0859   PLT 243 10/01/2019 1558   MCV 95.4 05/26/2020 0859   MCV 90 10/01/2019 1558   MCH 30.3 05/26/2020 0859   MCHC 31.7 05/26/2020 0859   RDW 12.1 05/26/2020 0859   RDW 12.2 10/01/2019 1558   LYMPHSABS 1.4 08/14/2015 1015   MONOABS 0.6 08/14/2015 1015   EOSABS 0.2 08/14/2015 1015   BASOSABS 0.1 08/14/2015 1015    BMET    Component Value Date/Time   NA 141 05/31/2020 0830   NA 140 03/30/2020 0742   K 5.1 05/31/2020 0830   CL 103 05/31/2020 0830   CO2 23 05/26/2020 0859   GLUCOSE 122 (H) 05/31/2020 0830   BUN 16 05/31/2020 0830   BUN 18 03/30/2020 0742   CREATININE 1.10 05/31/2020 0830   CREATININE 0.80 08/14/2015 1015   CALCIUM 10.3 05/26/2020 0859  GFRNONAA >60 05/26/2020 0859   GFRNONAA >89 08/14/2015 1015   GFRAA >60 05/26/2020 0859   GFRAA >89 08/14/2015 1015    BNP No results found for: BNP  ProBNP No results found for: PROBNP  Specialty Problems    None      No Known Allergies  Immunization History  Administered Date(s) Administered  . Influenza-Unspecified 07/06/2019, 04/07/2020  . Td 08/04/2015    Past Medical History:  Diagnosis Date  . Abnormality of thoracic aorta    4.3 CM ECTATIC ASENDING   . Cardiopathy   . CHF (congestive heart failure) (HCC)    SYSTOLIC  . Hyperlipidemia   . Hypertension   . Lung nodule   . Obesity   . Persistent  atrial fibrillation (HCC)    WITH RVR  . Pleural effusion    RIGHT UPPER LOBE AND RIGHT LOWER LOBE     Tobacco History: Social History   Tobacco Use  Smoking Status Never Smoker  Smokeless Tobacco Never Used   Counseling given: Not Answered   Continue to not smoke  Outpatient Encounter Medications as of 06/02/2020  Medication Sig  . amiodarone (PACERONE) 200 MG tablet Take 1 tablet by mouth once daily (Patient taking differently: Take 200 mg by mouth daily. )  . apixaban (ELIQUIS) 5 MG TABS tablet Take 1 tablet (5 mg total) by mouth 2 (two) times daily.  Marland Kitchen ascorbic acid ([employer]S VITAMIN C) 500 MG tablet Take 500 mg by mouth daily.  Marland Kitchen atorvastatin (LIPITOR) 40 MG tablet Take 1 tablet (40 mg total) by mouth daily.  . Cholecalciferol (VITAMIN D3) 25 MCG (1000 UT) CAPS Take 1,000 Units by mouth daily.   . Glucosamine-Chondroitin (COSAMIN DS PO) Take 2 tablets by mouth daily.  . Multiple Minerals-Vitamins (CAL MAG ZINC +D3 PO) Take 1 tablet by mouth daily.   . Multiple Vitamin (MULTIVITAMIN WITH MINERALS) TABS tablet Take 1 tablet by mouth daily.  . sacubitril-valsartan (ENTRESTO) 49-51 MG Take 1 tablet by mouth 2 (two) times daily.  . metoprolol succinate (TOPROL-XL) 100 MG 24 hr tablet Take 1 tablet (100 mg total) by mouth daily. Take with or immediately following a meal.   No facility-administered encounter medications on file as of 06/02/2020.     Review of Systems  Review of Systems  No chest pain with exertion.  No exertional dyspnea.  No orthopnea or PND.  No extremity swelling.  Comments review of systems otherwise negative.  Physical Exam  BP 138/82 (BP Location: Right Arm, Cuff Size: Normal)   Pulse 78   Temp 98.4 F (36.9 C) (Oral)   Ht 5\' 9"  (1.753 m)   Wt 232 lb (105.2 kg)   SpO2 98%   BMI 34.26 kg/m   Wt Readings from Last 5 Encounters:  06/02/20 232 lb (105.2 kg)  05/31/20 234 lb (106.1 kg)  05/11/20 234 lb (106.1 kg)  04/14/20 236 lb (107 kg)  04/13/20  242 lb 12.8 oz (110.1 kg)    BMI Readings from Last 5 Encounters:  06/02/20 34.26 kg/m  05/31/20 34.56 kg/m  05/11/20 34.56 kg/m  04/14/20 34.85 kg/m  04/13/20 35.86 kg/m     Physical Exam General: Well-appearing, no distress Neck: No JVP patient, supple Eyes: EOMI, no icterus Respiratory: Clear aspiration bilaterally, no wheezing Cardiovascular: Regular rate and rhythm on exam today, no murmurs Soft, bowel sounds present MSK: No synovitis, no joint effusions Neuro: Normal gait, no weakness Psych: Normal mood, full affect   Assessment & Plan:   Solitary  pulmonary nodule: 4 mm right lower lobe nodule stable on scan August 2021 compared to January 2021.  Given patient's age, lack of smoking, and imaging characteristics and location of the nodule, risk malignancy is 1.6% via Mayo nodule calculator, very low.  Per Fleischner guidelines no further follow-up needed as he is low risk individual (never smoker, no family history of lung cancer, no personal history of cancer).  It seems he will be receiving routine CT scans for monitoring of the aneurysm and if nodule changes encouraged him to come back for follow-up and further discussions.  Pulmonary embolus: Several days of dyspnea and difficulty sleeping related to CT scan 09/2019 with discovery of right lower and right upper small subsegmental PEs.  Seem unprovoked based on questioning today.  On Eliquis for A. fib.  Advised him to continue.  Return if symptoms worsen or fail to improve.   Lanier Clam, MD 06/02/2020

## 2020-06-02 NOTE — Patient Instructions (Addendum)
Nice to meet you!  Based on the size and location of the spot in the lung, the risk of malignancy is 1.6% - very very low! This is good. It is most likley an old scar.  No need for further CT scans to follow the spot. The advantage to the scans for the aorta is that they will see and follow that spot and if things change please come back to see me.  Follow up as needed.

## 2020-06-07 ENCOUNTER — Encounter (HOSPITAL_COMMUNITY): Payer: Self-pay

## 2020-06-07 ENCOUNTER — Ambulatory Visit (HOSPITAL_COMMUNITY): Payer: BLUE CROSS/BLUE SHIELD | Admitting: Physician Assistant

## 2020-06-14 ENCOUNTER — Encounter: Payer: Self-pay | Admitting: Internal Medicine

## 2020-06-14 ENCOUNTER — Other Ambulatory Visit: Payer: Self-pay

## 2020-06-14 ENCOUNTER — Encounter: Payer: Self-pay | Admitting: *Deleted

## 2020-06-14 ENCOUNTER — Ambulatory Visit: Payer: BLUE CROSS/BLUE SHIELD | Admitting: Internal Medicine

## 2020-06-14 VITALS — BP 122/88 | HR 132 | Ht 69.0 in | Wt 234.0 lb

## 2020-06-14 DIAGNOSIS — G4733 Obstructive sleep apnea (adult) (pediatric): Secondary | ICD-10-CM

## 2020-06-14 DIAGNOSIS — I428 Other cardiomyopathies: Secondary | ICD-10-CM | POA: Diagnosis not present

## 2020-06-14 DIAGNOSIS — I4819 Other persistent atrial fibrillation: Secondary | ICD-10-CM | POA: Diagnosis not present

## 2020-06-14 DIAGNOSIS — I4891 Unspecified atrial fibrillation: Secondary | ICD-10-CM

## 2020-06-14 NOTE — Progress Notes (Signed)
PCP: Alroy Dust, L.Marlou Sa, MD Primary Cardiologist: Dr Acie Fredrickson Primary EP: Dr Rayann Heman  Jeffrey Griffin is a 59 y.o. male who presents today for routine electrophysiology followup.  Since last being seen in our clinic, the patient reports doing reasonably well.  Cardioversion on amiodarone was not successful.  He has fatigue and decreased exercise tolerance with is afib.  Today, he denies symptoms of palpitations, chest pain, shortness of breath,  lower extremity edema, dizziness, presyncope, or syncope.  The patient is otherwise without complaint today.   Past Medical History:  Diagnosis Date  . Abnormality of thoracic aorta    4.3 CM ECTATIC ASENDING   . Cardiopathy   . CHF (congestive heart failure) (HCC)    SYSTOLIC  . Hyperlipidemia   . Hypertension   . Lung nodule   . Obesity   . Persistent atrial fibrillation (HCC)    WITH RVR  . Pleural effusion    RIGHT UPPER LOBE AND RIGHT LOWER LOBE    Past Surgical History:  Procedure Laterality Date  . APPENDECTOMY    . CARDIOVERSION N/A 10/20/2019   Procedure: CARDIOVERSION;  Surgeon: Acie Fredrickson, Wonda Cheng, MD;  Location: [facility] ENDOSCOPY;  Service: Cardiovascular;  Laterality: N/A;  . CARDIOVERSION N/A 05/31/2020   Procedure: CARDIOVERSION;  Surgeon: Josue Hector, MD;  Location: [facility] ENDOSCOPY;  Service: Cardiovascular;  Laterality: N/A;  . VASECTOMY      ROS- all systems are reviewed and negatives except as per HPI above  Current Outpatient Medications  Medication Sig Dispense Refill  . amiodarone (PACERONE) 200 MG tablet Take 1 tablet by mouth once daily 60 tablet 0  . apixaban (ELIQUIS) 5 MG TABS tablet Take 1 tablet (5 mg total) by mouth 2 (two) times daily. 60 tablet 5  . ascorbic acid ([employer]S VITAMIN C) 500 MG tablet Take 500 mg by mouth daily.    Marland Kitchen atorvastatin (LIPITOR) 40 MG tablet Take 1 tablet (40 mg total) by mouth daily. 90 tablet 3  . Cholecalciferol (VITAMIN D3) 25 MCG (1000 UT) CAPS Take 1,000 Units by mouth daily.     .  Glucosamine-Chondroitin (COSAMIN DS PO) Take 2 tablets by mouth daily.    . Multiple Minerals-Vitamins (CAL MAG ZINC +D3 PO) Take 1 tablet by mouth daily.     . Multiple Vitamin (MULTIVITAMIN WITH MINERALS) TABS tablet Take 1 tablet by mouth daily.    . sacubitril-valsartan (ENTRESTO) 49-51 MG Take 1 tablet by mouth 2 (two) times daily. 60 tablet 11  . metoprolol succinate (TOPROL-XL) 100 MG 24 hr tablet Take 1 tablet (100 mg total) by mouth daily. Take with or immediately following a meal. 90 tablet 3   No current facility-administered medications for this visit.    Physical Exam: Vitals:   06/14/20 0837  BP: 122/88  Pulse: (!) 132  SpO2: 96%  Weight: 234 lb (106.1 kg)  Height: 5\' 9"  (1.753 m)    GEN- The patient is well appearing, alert and oriented x 3 today.   Head- normocephalic, atraumatic Eyes-  Sclera clear, conjunctiva pink Ears- hearing intact Oropharynx- clear Lungs-   normal work of breathing Heart- irregular rate and rhythm  GI- soft, NT, ND, + BS Extremities- no clubbing, cyanosis, or edema  Wt Readings from Last 3 Encounters:  06/14/20 234 lb (106.1 kg)  06/02/20 232 lb (105.2 kg)  05/31/20 234 lb (106.1 kg)    EKG tracing ordered today is personally reviewed and shows typical appearing atrial flutter with 2:1 conduction PVCs  Assessment and Plan:  1. Persistent afib/ typical atrial flutter The patient has symptomatic, recurrent persistent atrial fibrillation and atrial flutter. he has failed medical therapy with amiodarone. He has severe LA enlargement with LA size of 53 mm.  Chads2vasc score is 2.  he is anticoagulated with eliquis . Therapeutic strategies for afib including rate control and ablation were discussed in detail with the patient today. Risk, benefits, and alternatives to EP study and radiofrequency ablation for afib were also discussed in detail today. These risks include but are not limited to stroke, bleeding, vascular damage, tamponade,  perforation, damage to the esophagus, lungs, and other structures, pulmonary vein stenosis, worsening renal function, and death. The patient understands these risk and wishes to proceed.  We will therefore proceed with catheter ablation at the next available time.  Carto, ICE, anesthesia are requested for the procedure.  Will also obtain cardiac CT prior to the procedure to exclude LAA thrombus and further evaluate atrial anatomy.  2. OSA Has not been assessed recently but previously used CPAP I will refer to Dr Carlena Sax for further evaluation and management  3. Tachycardia mediated CM EF 40% Compensated Continue current therapy Reassess EF once in sinus after 3-6 months  4. HTN Stable No change required today   Risks, benefits and potential toxicities for medications prescribed and/or refilled reviewed with patient today.   [name] MD, [facility] 06/14/2020 8:42 AM

## 2020-06-14 NOTE — Patient Instructions (Addendum)
Medication Instructions:  Your physician recommends that you continue on your current medications as directed. Please refer to the Current Medication list given to you today.   *If you need a refill on your cardiac medications before your next appointment, please call your pharmacy*  Lab Work: none  If you have labs (blood work) drawn today and your tests are completely normal, you will receive your results only by: Marland Kitchen MyChart Message (if you have MyChart) OR . A paper copy in the mail If you have any lab test that is abnormal or we need to change your treatment, we will call you to review the results.  Testing/Procedures: Your physician has recommended that you have an ablation. Catheter ablation is a medical procedure used to treat some cardiac arrhythmias (irregular heartbeats). During catheter ablation, a long, thin, flexible tube is put into a blood vessel in your groin (upper thigh), or neck. This tube is called an ablation catheter. It is then guided to your heart through the blood vessel. Radio frequency waves destroy small areas of heart tissue where abnormal heartbeats may cause an arrhythmia to start. Please see the instruction sheet given to you today.   Follow-Up: At [facility], you and your health needs are our priority.  As part of our continuing mission to provide you with exceptional heart care, we have created designated Provider Care Teams.  These Care Teams include your primary Cardiologist (physician) and Advanced Practice Providers (APPs -  Physician Assistants and Nurse Practitioners) who all work together to provide you with the care you need, when you need it.  We recommend signing up for the patient portal called "MyChart".  Sign up information is provided on this After Visit Summary.  MyChart is used to connect with patients for Virtual Visits (Telemedicine).  Patients are able to view lab/test results, encounter notes, upcoming appointments, etc.  Non-urgent  messages can be sent to your provider as well.   To learn more about what you can do with MyChart, go to [email or web address].     Other Instructions:  Cardiac Ablation Cardiac ablation is a procedure to disable (ablate) a small amount of heart tissue in very specific places. The heart has many electrical connections. Sometimes these connections are abnormal and can cause the heart to beat very fast or irregularly. Ablating some of the problem areas can improve the heart rhythm or return it to normal. Ablation may be done for people who:  Have Wolff-Parkinson-White syndrome.  Have fast heart rhythms (tachycardia).  Have taken medicines for an abnormal heart rhythm (arrhythmia) that were not effective or caused side effects.  Have a high-risk heartbeat that may be life-threatening. During the procedure, a small incision is made in the neck or the groin, and a long, thin, flexible tube (catheter) is inserted into the incision and moved to the heart. Small devices (electrodes) on the tip of the catheter will send out electrical currents. A type of X-ray (fluoroscopy) will be used to help guide the catheter and to provide images of the heart. Tell a health care provider about:  Any allergies you have.  All medicines you are taking, including vitamins, herbs, eye drops, creams, and over-the-counter medicines.  Any problems you or family members have had with anesthetic medicines.  Any blood disorders you have.  Any surgeries you have had.  Any medical conditions you have, such as kidney failure.  Whether you are pregnant or may be pregnant. What are the risks? Generally, this is a safe procedure.  However, problems may occur, including:  Infection.  Bruising and bleeding at the catheter insertion site.  Bleeding into the chest, especially into the sac that surrounds the heart. This is a serious complication.  Stroke or blood clots.  Damage to other structures or  organs.  Allergic reaction to medicines or dyes.  Need for a permanent pacemaker if the normal electrical system is damaged. A pacemaker is a small computer that sends electrical signals to the heart and helps your heart beat normally.  The procedure not being fully effective. This may not be recognized until months later. Repeat ablation procedures are sometimes required. What happens before the procedure?  Follow instructions from your health care provider about eating or drinking restrictions.  Ask your health care provider about: ? Changing or stopping your regular medicines. This is especially important if you are taking diabetes medicines or blood thinners. ? Taking medicines such as aspirin and ibuprofen. These medicines can thin your blood. Do not take these medicines before your procedure if your health care provider instructs you not to.  Plan to have someone take you home from the hospital or clinic.  If you will be going home right after the procedure, plan to have someone with you for 24 hours. What happens during the procedure?  To lower your risk of infection: ? Your health care team will wash or sanitize their hands. ? Your skin will be washed with soap. ? Hair may be removed from the incision area.  An IV tube will be inserted into one of your veins.  You will be given a medicine to help you relax (sedative).  The skin on your neck or groin will be numbed.  An incision will be made in your neck or your groin.  A needle will be inserted through the incision and into a large vein in your neck or groin.  A catheter will be inserted into the needle and moved to your heart.  Dye may be injected through the catheter to help your surgeon see the area of the heart that needs treatment.  Electrical currents will be sent from the catheter to ablate heart tissue in desired areas. There are three types of energy that may be used to ablate heart tissue: ? Heat  (radiofrequency energy). ? Laser energy. ? Extreme cold (cryoablation).  When the necessary tissue has been ablated, the catheter will be removed.  Pressure will be held on the catheter insertion area to prevent excessive bleeding.  A bandage (dressing) will be placed over the catheter insertion area. The procedure may vary among health care providers and hospitals. What happens after the procedure?  Your blood pressure, heart rate, breathing rate, and blood oxygen level will be monitored until the medicines you were given have worn off.  Your catheter insertion area will be monitored for bleeding. You will need to lie still for a few hours to ensure that you do not bleed from the catheter insertion area.  Do not drive for 24 hours or as long as directed by your health care provider. Summary  Cardiac ablation is a procedure to disable (ablate) a small amount of heart tissue in very specific places. Ablating some of the problem areas can improve the heart rhythm or return it to normal.  During the procedure, electrical currents will be sent from the catheter to ablate heart tissue in desired areas. This information is not intended to replace advice given to you by your health care provider. Make sure you  discuss any questions you have with your health care provider. Document Revised: 02/11/2018 Document Reviewed: 07/10/2016 Elsevier Patient Education  [employer].

## 2020-06-21 ENCOUNTER — Other Ambulatory Visit: Payer: Self-pay

## 2020-06-21 ENCOUNTER — Other Ambulatory Visit: Payer: BLUE CROSS/BLUE SHIELD | Admitting: *Deleted

## 2020-06-21 DIAGNOSIS — I4891 Unspecified atrial fibrillation: Secondary | ICD-10-CM

## 2020-06-21 DIAGNOSIS — I4819 Other persistent atrial fibrillation: Secondary | ICD-10-CM | POA: Diagnosis not present

## 2020-06-21 DIAGNOSIS — I428 Other cardiomyopathies: Secondary | ICD-10-CM

## 2020-06-21 DIAGNOSIS — G4733 Obstructive sleep apnea (adult) (pediatric): Secondary | ICD-10-CM | POA: Diagnosis not present

## 2020-06-21 LAB — CBC WITH DIFFERENTIAL/PLATELET
Basophils Absolute: 0.1 10*3/uL (ref 0.0–0.2)
Basos: 1 %
EOS (ABSOLUTE): 0.1 10*3/uL (ref 0.0–0.4)
Eos: 2 %
Hematocrit: 51.2 % — ABNORMAL HIGH (ref 37.5–51.0)
Hemoglobin: 16.7 g/dL (ref 13.0–17.7)
Immature Grans (Abs): 0 10*3/uL (ref 0.0–0.1)
Immature Granulocytes: 0 %
Lymphocytes Absolute: 1.7 10*3/uL (ref 0.7–3.1)
Lymphs: 23 %
MCH: 30 pg (ref 26.6–33.0)
MCHC: 32.6 g/dL (ref 31.5–35.7)
MCV: 92 fL (ref 79–97)
Monocytes Absolute: 0.8 10*3/uL (ref 0.1–0.9)
Monocytes: 10 %
Neutrophils Absolute: 4.9 10*3/uL (ref 1.4–7.0)
Neutrophils: 64 %
Platelets: 268 10*3/uL (ref 150–450)
RBC: 5.57 x10E6/uL (ref 4.14–5.80)
RDW: 11.9 % (ref 11.6–15.4)
WBC: 7.6 10*3/uL (ref 3.4–10.8)

## 2020-06-21 LAB — BASIC METABOLIC PANEL
BUN/Creatinine Ratio: 11 (ref 9–20)
BUN: 14 mg/dL (ref 6–24)
CO2: 22 mmol/L (ref 20–29)
Calcium: 10.8 mg/dL — ABNORMAL HIGH (ref 8.7–10.2)
Chloride: 100 mmol/L (ref 96–106)
Creatinine, Ser: 1.27 mg/dL (ref 0.76–1.27)
GFR calc Af Amer: 73 mL/min/{1.73_m2} (ref >59)
GFR calc non Af Amer: 63 mL/min/{1.73_m2} (ref >59)
Glucose: 123 mg/dL — ABNORMAL HIGH (ref 65–99)
Potassium: 4.3 mmol/L (ref 3.5–5.2)
Sodium: 139 mmol/L (ref 134–144)

## 2020-07-06 ENCOUNTER — Other Ambulatory Visit (HOSPITAL_COMMUNITY): Payer: Self-pay

## 2020-07-06 MED ORDER — AMIODARONE HCL 200 MG PO TABS: ORAL_TABLET | ORAL | 4 refills | Status: DC

## 2020-07-07 ENCOUNTER — Telehealth (HOSPITAL_COMMUNITY): Payer: Self-pay | Admitting: Emergency Medicine

## 2020-07-07 NOTE — Telephone Encounter (Signed)
Reaching out to patient to offer assistance regarding upcoming cardiac imaging study; pt verbalizes understanding of appt date/time, parking situation and where to check in, pre-test NPO status and medications ordered, and verified current allergies; name and call back number provided for further questions should they arise Rhaya Coale RN Navigator Cardiac Imaging [facility] [phone] office [phone] cell 

## 2020-07-09 ENCOUNTER — Other Ambulatory Visit: Payer: Self-pay

## 2020-07-09 ENCOUNTER — Ambulatory Visit (HOSPITAL_COMMUNITY)
Admission: RE | Admit: 2020-07-09 | Discharge: 2020-07-09 | Disposition: A | Discharge status: Home / Self Care | Payer: BLUE CROSS/BLUE SHIELD | Source: Ambulatory Visit | Attending: Internal Medicine | Admitting: Internal Medicine

## 2020-07-09 DIAGNOSIS — I4891 Unspecified atrial fibrillation: Secondary | ICD-10-CM | POA: Diagnosis not present

## 2020-07-09 MED ORDER — IOHEXOL 350 MG/ML SOLN
80.0000 mL | Freq: Once | INTRAVENOUS | Status: AC | PRN
  Administered 2020-07-09: 80 mL via INTRAVENOUS

## 2020-07-12 DIAGNOSIS — G4733 Obstructive sleep apnea (adult) (pediatric): Secondary | ICD-10-CM | POA: Diagnosis not present

## 2020-07-12 DIAGNOSIS — I429 Cardiomyopathy, unspecified: Secondary | ICD-10-CM | POA: Diagnosis not present

## 2020-07-12 DIAGNOSIS — I4891 Unspecified atrial fibrillation: Secondary | ICD-10-CM | POA: Diagnosis not present

## 2020-07-12 DIAGNOSIS — I2699 Other pulmonary embolism without acute cor pulmonale: Secondary | ICD-10-CM | POA: Diagnosis not present

## 2020-07-13 ENCOUNTER — Ambulatory Visit: Payer: BLUE CROSS/BLUE SHIELD | Admitting: Cardiovascular Disease

## 2020-07-15 ENCOUNTER — Other Ambulatory Visit (HOSPITAL_COMMUNITY)
Admission: RE | Admit: 2020-07-15 | Discharge: 2020-07-15 | Disposition: A | Discharge status: Home / Self Care | Payer: BLUE CROSS/BLUE SHIELD | Source: Ambulatory Visit | Attending: Internal Medicine | Admitting: Internal Medicine

## 2020-07-15 NOTE — Progress Notes (Signed)
Instructed patient on the following items: Arrival time 0530 Nothing to eat or drink after midnight No meds AM of procedure Responsible person to drive you home and stay with you for 24 hrs  Have you missed any doses of anti-coagulant Eliquis- hasn't missed any doses    

## 2020-07-16 ENCOUNTER — Ambulatory Visit (HOSPITAL_COMMUNITY): Payer: BLUE CROSS/BLUE SHIELD | Admitting: Anesthesiology

## 2020-07-16 ENCOUNTER — Ambulatory Visit (HOSPITAL_COMMUNITY)
Admission: RE | Admit: 2020-07-16 | Discharge: 2020-07-16 | Disposition: A | Discharge status: Home / Self Care | Payer: BLUE CROSS/BLUE SHIELD | Attending: Internal Medicine | Admitting: Internal Medicine

## 2020-07-16 ENCOUNTER — Encounter (HOSPITAL_COMMUNITY)
Admission: RE | Disposition: A | Discharge status: Home / Self Care | Payer: Self-pay | Source: Home / Self Care | Attending: Internal Medicine

## 2020-07-16 ENCOUNTER — Other Ambulatory Visit: Payer: Self-pay

## 2020-07-16 ENCOUNTER — Encounter (HOSPITAL_COMMUNITY): Payer: Self-pay | Admitting: Internal Medicine

## 2020-07-16 DIAGNOSIS — I483 Typical atrial flutter: Secondary | ICD-10-CM | POA: Insufficient documentation

## 2020-07-16 DIAGNOSIS — Z20822 Contact with and (suspected) exposure to covid-19: Secondary | ICD-10-CM | POA: Insufficient documentation

## 2020-07-16 DIAGNOSIS — E785 Hyperlipidemia, unspecified: Secondary | ICD-10-CM | POA: Diagnosis not present

## 2020-07-16 DIAGNOSIS — I4819 Other persistent atrial fibrillation: Secondary | ICD-10-CM | POA: Diagnosis not present

## 2020-07-16 DIAGNOSIS — Z79899 Other long term (current) drug therapy: Secondary | ICD-10-CM | POA: Insufficient documentation

## 2020-07-16 DIAGNOSIS — Z7901 Long term (current) use of anticoagulants: Secondary | ICD-10-CM | POA: Diagnosis not present

## 2020-07-16 DIAGNOSIS — I5043 Acute on chronic combined systolic (congestive) and diastolic (congestive) heart failure: Secondary | ICD-10-CM | POA: Diagnosis not present

## 2020-07-16 DIAGNOSIS — I4892 Unspecified atrial flutter: Secondary | ICD-10-CM | POA: Diagnosis not present

## 2020-07-16 DIAGNOSIS — I110 Hypertensive heart disease with heart failure: Secondary | ICD-10-CM | POA: Diagnosis not present

## 2020-07-16 HISTORY — PX: ATRIAL FIBRILLATION ABLATION: EP1191

## 2020-07-16 LAB — POCT ACTIVATED CLOTTING TIME
Activated Clotting Time: 208 seconds
Activated Clotting Time: 241 seconds
Activated Clotting Time: 263 seconds

## 2020-07-16 LAB — SARS CORONAVIRUS 2 BY RT PCR (HOSPITAL ORDER, PERFORMED IN ~~LOC~~ HOSPITAL LAB): SARS Coronavirus 2: NEGATIVE

## 2020-07-16 SURGERY — ATRIAL FIBRILLATION ABLATION
Anesthesia: General

## 2020-07-16 MED ORDER — HEPARIN SODIUM (PORCINE) 1000 UNIT/ML IJ SOLN
INTRAMUSCULAR | Status: AC
  Filled 2020-07-16: qty 1

## 2020-07-16 MED ORDER — LACTATED RINGERS IV SOLN: INTRAVENOUS | Status: DC | PRN

## 2020-07-16 MED ORDER — HYDROCODONE-ACETAMINOPHEN 5-325 MG PO TABS: 1.0000 | ORAL_TABLET | ORAL | Status: DC | PRN

## 2020-07-16 MED ORDER — SODIUM CHLORIDE 0.9 % IV SOLN: INTRAVENOUS | Status: DC

## 2020-07-16 MED ORDER — MIDAZOLAM HCL 2 MG/2ML IJ SOLN
INTRAMUSCULAR | Status: DC | PRN
  Administered 2020-07-16: 2 mg via INTRAVENOUS

## 2020-07-16 MED ORDER — HEPARIN SODIUM (PORCINE) 1000 UNIT/ML IJ SOLN
INTRAMUSCULAR | Status: DC | PRN
  Administered 2020-07-16: 14000 [IU] via INTRAVENOUS
  Administered 2020-07-16: 1000 [IU] via INTRAVENOUS

## 2020-07-16 MED ORDER — LIDOCAINE 2% (20 MG/ML) 5 ML SYRINGE
INTRAMUSCULAR | Status: DC | PRN
  Administered 2020-07-16: 60 mg via INTRAVENOUS

## 2020-07-16 MED ORDER — ACETAMINOPHEN 325 MG PO TABS: 650.0000 mg | ORAL_TABLET | ORAL | Status: DC | PRN

## 2020-07-16 MED ORDER — EPHEDRINE SULFATE-NACL 50-0.9 MG/10ML-% IV SOSY
PREFILLED_SYRINGE | INTRAVENOUS | Status: DC | PRN
  Administered 2020-07-16: 10 mg via INTRAVENOUS

## 2020-07-16 MED ORDER — SUCCINYLCHOLINE CHLORIDE 200 MG/10ML IV SOSY
PREFILLED_SYRINGE | INTRAVENOUS | Status: DC | PRN
  Administered 2020-07-16: 140 mg via INTRAVENOUS

## 2020-07-16 MED ORDER — APIXABAN 5 MG PO TABS
5.0000 mg | ORAL_TABLET | Freq: Once | ORAL | Status: AC
  Administered 2020-07-16: 5 mg via ORAL
  Filled 2020-07-16: qty 1

## 2020-07-16 MED ORDER — ISOPROTERENOL HCL 0.2 MG/ML IJ SOLN
INTRAMUSCULAR | Status: AC
  Filled 2020-07-16: qty 5

## 2020-07-16 MED ORDER — PANTOPRAZOLE SODIUM 40 MG PO TBEC: 40.0000 mg | DELAYED_RELEASE_TABLET | Freq: Every day | ORAL | 0 refills | Status: DC

## 2020-07-16 MED ORDER — PHENYLEPHRINE 40 MCG/ML (10ML) SYRINGE FOR IV PUSH (FOR BLOOD PRESSURE SUPPORT): PREFILLED_SYRINGE | INTRAVENOUS | Status: DC | PRN

## 2020-07-16 MED ORDER — SODIUM CHLORIDE 0.9% FLUSH: 3.0000 mL | INTRAVENOUS | Status: DC | PRN

## 2020-07-16 MED ORDER — PROTAMINE SULFATE 10 MG/ML IV SOLN
INTRAVENOUS | Status: DC | PRN
  Administered 2020-07-16: 40 mg via INTRAVENOUS

## 2020-07-16 MED ORDER — PHENYLEPHRINE HCL-NACL 10-0.9 MG/250ML-% IV SOLN
INTRAVENOUS | Status: DC | PRN
  Administered 2020-07-16: 50 ug/min via INTRAVENOUS
  Administered 2020-07-16: 25 ug/min via INTRAVENOUS

## 2020-07-16 MED ORDER — SODIUM CHLORIDE 0.9% FLUSH: 3.0000 mL | Freq: Two times a day (BID) | INTRAVENOUS | Status: DC

## 2020-07-16 MED ORDER — ROCURONIUM BROMIDE 10 MG/ML (PF) SYRINGE
PREFILLED_SYRINGE | INTRAVENOUS | Status: DC | PRN
  Administered 2020-07-16: 50 mg via INTRAVENOUS

## 2020-07-16 MED ORDER — FENTANYL CITRATE (PF) 250 MCG/5ML IJ SOLN
INTRAMUSCULAR | Status: DC | PRN
  Administered 2020-07-16: 100 ug via INTRAVENOUS

## 2020-07-16 MED ORDER — SODIUM CHLORIDE 0.9 % IV SOLN: 250.0000 mL | INTRAVENOUS | Status: DC | PRN

## 2020-07-16 MED ORDER — HEPARIN SODIUM (PORCINE) 1000 UNIT/ML IJ SOLN
INTRAMUSCULAR | Status: DC | PRN
  Administered 2020-07-16 (×2): 5000 [IU] via INTRAVENOUS

## 2020-07-16 MED ORDER — HEPARIN (PORCINE) IN NACL 1000-0.9 UT/500ML-% IV SOLN
INTRAVENOUS | Status: DC | PRN
  Administered 2020-07-16: 500 mL

## 2020-07-16 MED ORDER — DEXAMETHASONE SODIUM PHOSPHATE 10 MG/ML IJ SOLN
INTRAMUSCULAR | Status: DC | PRN
  Administered 2020-07-16: 10 mg via INTRAVENOUS

## 2020-07-16 MED ORDER — HEPARIN (PORCINE) IN NACL 1000-0.9 UT/500ML-% IV SOLN
INTRAVENOUS | Status: AC
  Filled 2020-07-16: qty 500

## 2020-07-16 MED ORDER — PROPOFOL 10 MG/ML IV BOLUS
INTRAVENOUS | Status: DC | PRN
  Administered 2020-07-16: 180 mg via INTRAVENOUS

## 2020-07-16 MED ORDER — ONDANSETRON HCL 4 MG/2ML IJ SOLN
INTRAMUSCULAR | Status: DC | PRN
  Administered 2020-07-16: 4 mg via INTRAVENOUS

## 2020-07-16 MED ORDER — ONDANSETRON HCL 4 MG/2ML IJ SOLN: 4.0000 mg | Freq: Four times a day (QID) | INTRAMUSCULAR | Status: DC | PRN

## 2020-07-16 MED ORDER — SUGAMMADEX SODIUM 200 MG/2ML IV SOLN
INTRAVENOUS | Status: DC | PRN
  Administered 2020-07-16: 100 mg via INTRAVENOUS
  Administered 2020-07-16: 200 mg via INTRAVENOUS

## 2020-07-16 SURGICAL SUPPLY — 19 items
BLANKET WARM UNDERBOD FULL ACC (MISCELLANEOUS) ×2 IMPLANT
CATH 8FR REPROCESSED SOUNDSTAR (CATHETERS) ×2 IMPLANT
CATH MAPPNG PENTARAY F 2-6-2MM (CATHETERS) ×1 IMPLANT
CATH SMTCH THERMOCOOL SF DF (CATHETERS) ×2 IMPLANT
CATH WEBSTER BI DIR CS D-F CRV (CATHETERS) ×2 IMPLANT
CLOSURE PERCLOSE PROSTYLE (VASCULAR PRODUCTS) ×6 IMPLANT
COVER SWIFTLINK CONNECTOR (BAG) ×2 IMPLANT
MAT PREVALON FULL STRYKER (MISCELLANEOUS) ×2 IMPLANT
NEEDLE BAYLIS TRANSSEPTAL 71CM (NEEDLE) ×2 IMPLANT
PACK EP LATEX FREE (CUSTOM PROCEDURE TRAY) ×2
PACK EP LF (CUSTOM PROCEDURE TRAY) ×1 IMPLANT
PAD PRO RADIOLUCENT 2001M-C (PAD) ×2 IMPLANT
PATCH CARTO3 (PAD) ×2 IMPLANT
PENTARAY F 2-6-2MM (CATHETERS) ×2
SHEATH PINNACLE 7F 10CM (SHEATH) ×4 IMPLANT
SHEATH PINNACLE 9F 10CM (SHEATH) ×2 IMPLANT
SHEATH PROBE COVER 6X72 (BAG) ×2 IMPLANT
SHEATH SWARTZ TS SL2 63CM 8.5F (SHEATH) ×2 IMPLANT
TUBING SMART ABLATE COOLFLOW (TUBING) ×2 IMPLANT

## 2020-07-16 NOTE — H&P (Signed)
Primary EP: Dr Rayann Heman  Jarick Harkins is a 59 y.o. male who presents today for routine electrophysiology study and ablation.  Since last being seen in our clinic, the patient reports doing reasonably well.  Cardioversion on amiodarone was not successful.  He has fatigue and decreased exercise tolerance with is afib.  Today, he denies symptoms of palpitations, chest pain, shortness of breath,  lower extremity edema, dizziness, presyncope, or syncope.  The patient is otherwise without complaint today.       Past Medical History:  Diagnosis Date  . Abnormality of thoracic aorta    4.3 CM ECTATIC ASENDING   . Cardiopathy   . CHF (congestive heart failure) (HCC)    SYSTOLIC  . Hyperlipidemia   . Hypertension   . Lung nodule   . Obesity   . Persistent atrial fibrillation (HCC)    WITH RVR  . Pleural effusion    RIGHT UPPER LOBE AND RIGHT LOWER LOBE         Past Surgical History:  Procedure Laterality Date  . APPENDECTOMY    . CARDIOVERSION N/A 10/20/2019   Procedure: CARDIOVERSION;  Surgeon: Acie Fredrickson, Wonda Cheng, MD;  Location: [facility] ENDOSCOPY;  Service: Cardiovascular;  Laterality: N/A;  . CARDIOVERSION N/A 05/31/2020   Procedure: CARDIOVERSION;  Surgeon: Josue Hector, MD;  Location[facility] ENDOSCOPY;  Service: Cardiovascular;  Laterality: N/A;  . VASECTOMY      ROS- all systems are reviewed and negatives except as per HPI above        Current Outpatient Medications  Medication Sig Dispense Refill  . amiodarone (PACERONE) 200 MG tablet Take 1 tablet by mouth once daily 60 tablet 0  . apixaban (ELIQUIS) 5 MG TABS tablet Take 1 tablet (5 mg total) by mouth 2 (two) times daily. 60 tablet 5  . ascorbic acid ([employer] VITAMIN C) 500 MG tablet Take 500 mg by mouth daily.    Marland Kitchen atorvastatin (LIPITOR) 40 MG tablet Take 1 tablet (40 mg total) by mouth daily. 90 tablet 3  . Cholecalciferol (VITAMIN D3) 25 MCG (1000 UT) CAPS Take 1,000 Units by mouth daily.     .  Glucosamine-Chondroitin (COSAMIN DS PO) Take 2 tablets by mouth daily.    . Multiple Minerals-Vitamins (CAL MAG ZINC +D3 PO) Take 1 tablet by mouth daily.     . Multiple Vitamin (MULTIVITAMIN WITH MINERALS) TABS tablet Take 1 tablet by mouth daily.    . sacubitril-valsartan (ENTRESTO) 49-51 MG Take 1 tablet by mouth 2 (two) times daily. 60 tablet 11  . metoprolol succinate (TOPROL-XL) 100 MG 24 hr tablet Take 1 tablet (100 mg total) by mouth daily. Take with or immediately following a meal. 90 tablet 3   No current facility-administered medications for this visit.    Physical Exam: Vitals:   07/16/20 0550  BP: (!) 149/99  Pulse: 62  Resp: 20  Temp: 98.5 F (36.9 C)  SpO2: 97%    GEN- The patient is well appearing, alert and oriented x 3 today.   Head- normocephalic, atraumatic Eyes-  Sclera clear, conjunctiva pink Ears- hearing intact Oropharynx- clear Lungs-   normal work of breathing Heart- irregular rate and rhythm  GI- soft, NT, ND, + BS Extremities- no clubbing, cyanosis, or edema     Wt Readings from Last 3 Encounters:  06/14/20 234 lb (106.1 kg)  06/02/20 232 lb (105.2 kg)  05/31/20 234 lb (106.1 kg)     Assessment and Plan:  1. Persistent afib/ typical atrial flutter The patient  has symptomatic, recurrent persistent atrial fibrillation and atrial flutter. he has failed medical therapy with amiodarone. He has severe LA enlargement with LA size of 53 mm.  Chads2vasc score is 2.  he is anticoagulated with eliquis .  He reports compliance with eliquis without interruption.  Cardiac CT reviewed at length with him today.  I have advised that he work with Dr Acie Fredrickson to arrange repeat chest CT in a year for thoracic aorta enlargement.  He expresses willingness to do so.  Risk, benefits, and alternatives to EP study and radiofrequency ablation for afib were also discussed in detail today. These risks include but are not limited to stroke, bleeding, vascular  damage, tamponade, perforation, damage to the esophagus, lungs, and other structures, pulmonary vein stenosis, worsening renal function, and death. The patient understands these risk and wishes to proceed.   [name] MD, [facility] 07/16/2020 7:25 AM

## 2020-07-16 NOTE — Anesthesia Preprocedure Evaluation (Signed)
Anesthesia Evaluation  Patient identified by MRN, date of birth, ID band Patient awake    Reviewed: Allergy & Precautions, H&P , NPO status , Patient's Chart, lab work & pertinent test results  Airway Mallampati: II   Neck ROM: full    Dental   Pulmonary neg pulmonary ROS,    breath sounds clear to auscultation       Cardiovascular hypertension, +CHF  + dysrhythmias Atrial Fibrillation  Rhythm:irregular Rate:Normal     Neuro/Psych    GI/Hepatic   Endo/Other    Renal/GU      Musculoskeletal   Abdominal   Peds  Hematology   Anesthesia Other Findings   Reproductive/Obstetrics                             Anesthesia Physical Anesthesia Plan  ASA: III  Anesthesia Plan: General   Post-op Pain Management:    Induction: Intravenous  PONV Risk Score and Plan: 2 and Ondansetron, Dexamethasone, Midazolam and Treatment may vary due to age or medical condition  Airway Management Planned: Oral ETT  Additional Equipment:   Intra-op Plan:   Post-operative Plan: Extubation in OR  Informed Consent: I have reviewed the patients History and Physical, chart, labs and discussed the procedure including the risks, benefits and alternatives for the proposed anesthesia with the patient or authorized representative who has indicated his/her understanding and acceptance.       Plan Discussed with: CRNA, Anesthesiologist and Surgeon  Anesthesia Plan Comments:         Anesthesia Quick Evaluation

## 2020-07-16 NOTE — Anesthesia Procedure Notes (Signed)
Procedure Name: Intubation Date/Time: 07/16/2020 7:45 AM Performed by: Thelma Comp, CRNA Pre-anesthesia Checklist: Patient identified, Emergency Drugs available, Suction available and Patient being monitored Patient Re-evaluated:Patient Re-evaluated prior to induction Oxygen Delivery Method: Circle System Utilized Preoxygenation: Pre-oxygenation with 100% oxygen Induction Type: IV induction Ventilation: Mask ventilation without difficulty Laryngoscope Size: Mac and 4 Grade View: Grade I Tube type: Oral Tube size: 7.5 mm Number of attempts: 1 Airway Equipment and Method: Stylet and Oral airway Placement Confirmation: ETT inserted through vocal cords under direct vision,  positive ETCO2 and breath sounds checked- equal and bilateral Secured at: 23 cm Tube secured with: Tape Dental Injury: Teeth and Oropharynx as per pre-operative assessment

## 2020-07-16 NOTE — Discharge Instructions (Signed)
Post procedure care instructions No driving for 4 days. No lifting over 5 lbs for 1 week. No vigorous or sexual activity for 1 week. You may return to work/your usual activities on 07/23/2020. Keep procedure site clean & dry. If you notice increased pain, swelling, bleeding or pus, call/return!  You may shower, but no soaking baths/hot tubs/pools for 1 week.     You have an appointment set up with the [facility].  Multiple studies have shown that being followed by a dedicated atrial fibrillation clinic in addition to the standard care you receive from your other physicians improves health. We believe that enrollment in the atrial fibrillation clinic will allow [place] to better care for you.   The phone number to the [facility] is [phone]. The clinic is staffed Monday through Friday from 8:30am to 5pm.  Parking Directions: The clinic is located in the Heart and Vascular Building connected to [facility]. 1)From [address] turn on to [address] and go to the 3rd entrance  (Heart and Vascular entrance) on the right. 2)Look to the right for Heart &Vascular Parking Garage. 3)A code for the entrance is required, for December is 3007.   4)Take the elevators to the 1st floor. Registration is in the room with the glass walls at the end of the hallway.  If you have any trouble parking or locating the clinic, please dont hesitate to call [phone].  Cardiac Ablation, Care After  This sheet gives you information about how to care for yourself after your procedure. Your health care provider may also give you more specific instructions. If you have problems or questions, contact your health care provider. What can I expect after the procedure? After the procedure, it is common to have:  Bruising around your puncture site.  Tenderness around your puncture site.  Skipped heartbeats.  Tiredness (fatigue).  Follow these instructions at  home: Puncture site care   Follow instructions from your health care provider about how to take care of your puncture site. Make sure you: ? If present, leave stitches (sutures), skin glue, or adhesive strips in place. These skin closures may need to stay in place for up to 2 weeks. If adhesive strip edges start to loosen and curl up, you may trim the loose edges. Do not remove adhesive strips completely unless your health care provider tells you to do that. ? If a square bandage is present, this may be removed in 24 hours.   Check your puncture site every day for signs of infection. Check for: ? Redness, swelling, or pain. ? Fluid or blood. If your puncture site starts to bleed, lie down on your back, apply firm pressure to the area, and contact your health care provider. ? Warmth. ? Pus or a bad smell. Driving  Do not drive for at least 4 days after your procedure or however long your health care provider recommends. (Do not resume driving if you have previously been instructed not to drive for other health reasons.)  Do not drive or use heavy machinery while taking prescription pain medicine. Activity  Avoid activities that take a lot of effort for at least 7 days after your procedure.  Do not lift anything that is heavier than 5 lb (4.5 kg) for one week.   No sexual activity for 1 week.   Return to your normal activities as told by your health care provider. Ask your health care provider what activities are safe for you. General instructions  Take over-the-counter and prescription medicines only as told by your health care provider.  Do not use any products that contain nicotine or tobacco, such as cigarettes and e-cigarettes. If you need help quitting, ask your health care provider.  You may shower after 24 hours, but Do not take baths, swim, or use a hot tub for 1 week.   Do not drink alcohol for 24 hours after your procedure.  Keep all follow-up visits as told by your  health care provider. This is important. Contact a health care provider if:  You have redness, mild swelling, or pain around your puncture site.  You have fluid or blood coming from your puncture site that stops after applying firm pressure to the area.  Your puncture site feels warm to the touch.  You have pus or a bad smell coming from your puncture site.  You have a fever.  You have chest pain or discomfort that spreads to your neck, jaw, or arm.  You are sweating a lot.  You feel nauseous.  You have a fast or irregular heartbeat.  You have shortness of breath.  You are dizzy or light-headed and feel the need to lie down.  You have pain or numbness in the arm or leg closest to your puncture site. Get help right away if:  Your puncture site suddenly swells.  Your puncture site is bleeding and the bleeding does not stop after applying firm pressure to the area. These symptoms may represent a serious problem that is an emergency. Do not wait to see if the symptoms will go away. Get medical help right away. Call your local emergency services (911 in the [place] Do not drive yourself to the hospital. Summary  After the procedure, it is normal to have bruising and tenderness at the puncture site in your groin, neck, or forearm.  Check your puncture site every day for signs of infection.  Get help right away if your puncture site is bleeding and the bleeding does not stop after applying firm pressure to the area. This is a medical emergency. This information is not intended to replace advice given to you by your health care provider. Make sure you discuss any questions you have with your health care provider.

## 2020-07-16 NOTE — Anesthesia Postprocedure Evaluation (Signed)
Anesthesia Post Note  Patient: Jeffrey Griffin  Procedure(s) Performed: ATRIAL FIBRILLATION ABLATION (N/A )     Patient location during evaluation: PACU Anesthesia Type: General Level of consciousness: awake and alert Pain management: pain level controlled Vital Signs Assessment: post-procedure vital signs reviewed and stable Respiratory status: spontaneous breathing, nonlabored ventilation and respiratory function stable Cardiovascular status: blood pressure returned to baseline and stable Postop Assessment: no apparent nausea or vomiting Anesthetic complications: no   No complications documented.  Last Vitals:  Vitals:   07/16/20 1055 07/16/20 1101  BP: 121/64 124/70  Pulse: 65 67  Resp: 12 13  Temp: 36.6 C   SpO2: 97% 96%    Last Pain:  Vitals:   07/16/20 1110  TempSrc:   PainSc: 0-No pain                 Lynda Rainwater

## 2020-07-16 NOTE — Transfer of Care (Signed)
Immediate Anesthesia Transfer of Care Note  Patient: Marx Doig  Procedure(s) Performed: ATRIAL FIBRILLATION ABLATION (N/A )  Patient Location: PACU  Anesthesia Type:General  Level of Consciousness: drowsy and patient cooperative  Airway & Oxygen Therapy: Patient Spontanous Breathing and Patient connected to face mask oxygen  Post-op Assessment: Report given to RN and Post -op Vital signs reviewed and stable  Post vital signs: Reviewed and stable  Last Vitals:  Vitals Value Taken Time  BP 134/82 07/16/20 1025  Temp    Pulse 69 07/16/20 1028  Resp 15 07/16/20 1028  SpO2 99 % 07/16/20 1028  Vitals shown include unvalidated device data.  Last Pain:  Vitals:   07/16/20 0550  TempSrc: Oral  PainSc: 0-No pain         Complications: No complications documented.

## 2020-07-21 ENCOUNTER — Ambulatory Visit (HOSPITAL_COMMUNITY)
Admission: RE | Admit: 2020-07-21 | Discharge: 2020-07-21 | Disposition: A | Discharge status: Home / Self Care | Payer: BLUE CROSS/BLUE SHIELD | Source: Ambulatory Visit | Attending: Physician Assistant | Admitting: Physician Assistant

## 2020-07-21 ENCOUNTER — Other Ambulatory Visit: Payer: Self-pay

## 2020-07-21 ENCOUNTER — Encounter (HOSPITAL_COMMUNITY): Payer: Self-pay | Admitting: Physician Assistant

## 2020-07-21 VITALS — BP 108/90 | HR 137 | Ht 69.0 in | Wt 242.8 lb

## 2020-07-21 DIAGNOSIS — G4733 Obstructive sleep apnea (adult) (pediatric): Secondary | ICD-10-CM | POA: Insufficient documentation

## 2020-07-21 DIAGNOSIS — Z7901 Long term (current) use of anticoagulants: Secondary | ICD-10-CM | POA: Insufficient documentation

## 2020-07-21 DIAGNOSIS — Z86711 Personal history of pulmonary embolism: Secondary | ICD-10-CM | POA: Diagnosis not present

## 2020-07-21 DIAGNOSIS — I484 Atypical atrial flutter: Secondary | ICD-10-CM | POA: Diagnosis not present

## 2020-07-21 DIAGNOSIS — Z6835 Body mass index (BMI) 35.0-35.9, adult: Secondary | ICD-10-CM | POA: Insufficient documentation

## 2020-07-21 DIAGNOSIS — I5022 Chronic systolic (congestive) heart failure: Secondary | ICD-10-CM | POA: Diagnosis not present

## 2020-07-21 DIAGNOSIS — I4819 Other persistent atrial fibrillation: Secondary | ICD-10-CM | POA: Insufficient documentation

## 2020-07-21 DIAGNOSIS — D6869 Other thrombophilia: Secondary | ICD-10-CM | POA: Diagnosis not present

## 2020-07-21 DIAGNOSIS — E669 Obesity, unspecified: Secondary | ICD-10-CM | POA: Insufficient documentation

## 2020-07-21 DIAGNOSIS — I110 Hypertensive heart disease with heart failure: Secondary | ICD-10-CM | POA: Insufficient documentation

## 2020-07-21 DIAGNOSIS — Z79899 Other long term (current) drug therapy: Secondary | ICD-10-CM | POA: Diagnosis not present

## 2020-07-21 DIAGNOSIS — Z8249 Family history of ischemic heart disease and other diseases of the circulatory system: Secondary | ICD-10-CM | POA: Insufficient documentation

## 2020-07-21 MED ORDER — AMIODARONE HCL 200 MG PO TABS: ORAL_TABLET | ORAL | 3 refills | Status: DC

## 2020-07-21 NOTE — Progress Notes (Signed)
Primary Care Physician: Alroy Dust, L.Marlou Sa, MD Primary Cardiologist: Dr Acie Fredrickson Primary Electrophysiologist: Dr Rayann Heman Referring Physician: Dr Acie Fredrickson   Jeffrey Griffin is a 59 y.o. male with a history of HTN, chronic systolic CHF, ascending aortic aneurysm, prior PE, and persistent atrial fibrillation who presents for consultation in the [facility].  The patient was initially diagnosed with atrial fibrillation 08/2019 after presenting to urgent care with 4 days of worsening dyspnea. He was found to be in afib and also underwent a CT angiogram which showed an acute PE. He was treated at [facility]. Echo at that time showed an EF of 20-25%. On follow up with Dr Acie Fredrickson, he underwent successful DCCV on 10/20/19. Patient is on Eliquis for a CHADS2VASC score of 3. Repeat echo 11/26/19 showed improved EF 40%. Unfortunately, he was back in afib on follow up 04/06/20. He does notice some mild dyspnea with exertion when in afib. He denies significant alcohol use. He does have a h/o OSA.  On follow up today, patient is s/p afib ablation with Dr Rayann Heman on 07/16/20. Unfortunately, he felt he was back in afib with elevated heart rates on 07/19/20. He did have a sleep study with [facility] on 07/20/20 and his ECG at that time showed paroxysmal atrial fibrillation with long periods of SR. He was diagnosed with severe OSA and is being scheduled for CPAP titration. He denies swallowing or groin issues. He does have some mild pleuritic CP but this is improving.   Today, he denies symptoms of orthopnea, PND, lower extremity edema, dizziness, presyncope, syncope, bleeding, or neurologic sequela. The patient is tolerating medications without difficulties and is otherwise without complaint today.    Atrial Fibrillation Risk Factors:  he does have symptoms or diagnosis of sleep apnea. he is not on CPAP therapy. he does not have a history of rheumatic fever. he does not have a history of alcohol  use. The patient does have a history of early familial atrial fibrillation or other arrhythmias. Brother and sister have afib.  he has a BMI of Body mass index is 35.86 kg/m.Marland Kitchen Filed Weights   07/21/20 1415  Weight: 110.1 kg    Family History  Problem Relation Age of Onset  . Hypertrophic cardiomyopathy Mother   . Stroke Father   . Hypertension Father   . Hypertension Sister   . Atrial fibrillation Brother      Atrial Fibrillation Management history:  Previous antiarrhythmic drugs: amiodarone Previous cardioversions: 10/20/19, 05/31/20 Previous ablations: 07/16/20 CHADS2VASC score: 3 Anticoagulation history: Eliquis   Past Medical History:  Diagnosis Date  . Abnormality of thoracic aorta    4.3 CM ECTATIC ASENDING   . Cardiopathy   . CHF (congestive heart failure) (HCC)    SYSTOLIC  . Hyperlipidemia   . Hypertension   . Lung nodule   . Obesity   . Persistent atrial fibrillation (HCC)    WITH RVR  . Pleural effusion    RIGHT UPPER LOBE AND RIGHT LOWER LOBE    Past Surgical History:  Procedure Laterality Date  . APPENDECTOMY    . ATRIAL FIBRILLATION ABLATION N/A 07/16/2020   Procedure: ATRIAL FIBRILLATION ABLATION;  Surgeon: [name], MD;  Location: [facility];  Service: Cardiovascular;  Laterality: N/A;  . CARDIOVERSION N/A 10/20/2019   Procedure: CARDIOVERSION;  Surgeon: Thayer Headings, MD;  Location: [facility] ENDOSCOPY;  Service: Cardiovascular;  Laterality: N/A;  . CARDIOVERSION N/A 05/31/2020   Procedure: CARDIOVERSION;  Surgeon: Josue Hector, MD;  Location: [facility] ENDOSCOPY;  Service: Cardiovascular;  Laterality: N/A;  . VASECTOMY      Current Outpatient Medications  Medication Sig Dispense Refill  . apixaban (ELIQUIS) 5 MG TABS tablet Take 1 tablet (5 mg total) by mouth 2 (two) times daily. 60 tablet 5  . ascorbic acid ([facility] VITAMIN C) 500 MG tablet Take 500 mg by mouth daily.    Marland Kitchen atorvastatin (LIPITOR) 40 MG tablet Take 1 tablet (40 mg total) by  mouth daily. 90 tablet 3  . calcium carbonate (OSCAL) 1500 (600 Ca) MG TABS tablet Take by mouth daily.    . Carnitine-B5-B6 500-15-5 MG TABS Take by mouth.    . Cholecalciferol (VITAMIN D3) 25 MCG (1000 UT) CAPS Take 1,000 Units by mouth daily.     . Citrulline POWD Take by mouth.    Marland Kitchen CREATINE MONOHYDRATE PO Take by mouth.    . Cyanocobalamin (B-12) 5000 MCG CAPS Take by mouth.    . D-Ribose POWD by Does not apply route.    . Garlic 7893 MG CAPS Take by mouth.    . Glucosamine-Chondroitin (COSAMIN DS PO) Take 2 tablets by mouth daily.    . metoprolol succinate (TOPROL-XL) 100 MG 24 hr tablet Take 1 tablet (100 mg total) by mouth daily. Take with or immediately following a meal. 90 tablet 3  . Multiple Minerals-Vitamins (CAL MAG ZINC +D3 PO) Take 1 tablet by mouth daily.     . Multiple Vitamin (MULTIVITAMIN WITH MINERALS) TABS tablet Take 1 tablet by mouth daily.    . Omega-3 1400 MG CAPS Take by mouth.    Marland Kitchen omeprazole (PRILOSEC) 20 MG capsule Take 20 mg by mouth daily.    . pantoprazole (PROTONIX) 40 MG tablet Take 1 tablet (40 mg total) by mouth daily. 45 tablet 0  . sacubitril-valsartan (ENTRESTO) 49-51 MG Take 1 tablet by mouth 2 (two) times daily. 60 tablet 11  . Taurine 1000 MG CAPS Take by mouth.    . vitamin E 180 MG (400 UNITS) capsule Take 400 Units by mouth daily.    Marland Kitchen Zn-Pyg Afri-Nettle-Saw Palmet (SAW PALMETTO COMPLEX PO) Take by mouth.    Marland Kitchen amiodarone (PACERONE) 200 MG tablet Take 1 tablet by mouth twice a day for 1 week then reduce to 1 tablet daily 90 tablet 3   No current facility-administered medications for this encounter.    No Known Allergies  Social History   Socioeconomic History  . Marital status: Married    Spouse name: Not on file  . Number of children: Not on file  . Years of education: Not on file  . Highest education level: Not on file  Occupational History  . Not on file  Tobacco Use  . Smoking status: Never Smoker  . Smokeless tobacco: Never Used   Substance and Sexual Activity  . Alcohol use: Not Currently  . Drug use: No  . Sexual activity: Not on file  Other Topics Concern  . Not on file  Social History Narrative   Lives in [place].   Social Determinants of Health   Financial Resource Strain:   . Difficulty of Paying Living Expenses: Not on file  Food Insecurity:   . Worried About Charity fundraiser in the Last Year: Not on file  . Ran Out of Food in the Last Year: Not on file  Transportation Needs:   . Lack of Transportation (Medical): Not on file  . Lack of Transportation (Non-Medical): Not on file  Physical  Activity:   . Days of Exercise per Week: Not on file  . Minutes of Exercise per Session: Not on file  Stress:   . Feeling of Stress : Not on file  Social Connections:   . Frequency of Communication with Friends and Family: Not on file  . Frequency of Social Gatherings with Friends and Family: Not on file  . Attends Religious Services: Not on file  . Active Member of Clubs or Organizations: Not on file  . Attends Archivist Meetings: Not on file  . Marital Status: Not on file  Intimate Partner Violence:   . Fear of Current or Ex-Partner: Not on file  . Emotionally Abused: Not on file  . Physically Abused: Not on file  . Sexually Abused: Not on file     ROS- All systems are reviewed and negative except as per the HPI above.  Physical Exam: Vitals:   07/21/20 1415  BP: 108/90  Pulse: (!) 137  Weight: 110.1 kg  Height: 5\' 9"  (1.753 m)    GEN- The patient is well appearing obese male, alert and oriented x 3 today.   HEENT-head normocephalic, atraumatic, sclera clear, conjunctiva pink, hearing intact, trachea midline. Lungs- Clear to ausculation bilaterally, normal work of breathing Heart- irregular rate and rhythm, tachycardia, no murmurs, rubs or gallops  GI- soft, NT, ND, + BS Extremities- no clubbing, cyanosis, or edema MS- no significant deformity or atrophy Skin- no rash  or lesion Psych- euthymic mood, full affect Neuro- strength and sensation are intact   Wt Readings from Last 3 Encounters:  07/21/20 110.1 kg  07/16/20 107 kg  06/14/20 106.1 kg    EKG today demonstrates atypical atrial flutter with 2:1 block, HR 137, QRS 102, QTc 398  Echo 11/26/19 demonstrated  1. Left ventricular ejection fraction, by estimation, is 40%. The left  ventricle has mild to moderately decreased function. The left ventricle  demonstrates global hypokinesis. The left ventricular internal cavity size  was mildly dilated. There is moderate left ventricular hypertrophy. Left ventricular diastolic parameters are consistent with Grade I diastolic dysfunction (impaired relaxation).  2. Right ventricular systolic function is normal. The right ventricular  size is normal. Tricuspid regurgitation signal is inadequate for assessing  PA pressure.  3. Left atrial size was mildly dilated.  4. Right atrial size was mildly dilated.  5. The mitral valve is normal in structure. No evidence of mitral valve  regurgitation. No evidence of mitral stenosis.  6. The aortic valve is tricuspid. Aortic valve regurgitation is trivial.  No aortic stenosis is present.  7. Aortic dilatation noted. There is mild dilatation of the ascending  aorta measuring 42 mm.  8. The inferior vena cava is normal in size with greater than 50%  respiratory variability, suggesting right atrial pressure of 3 mmHg.   Epic records are reviewed at length today  CHA2DS2-VASc Score = 3  The patient's score is based upon: CHF History: 1 HTN History: 1 Diabetes History: 0 Stroke History: 0 Vascular Disease History: 1 (PE)      ASSESSMENT AND PLAN: 1. Persistent Atrial Fibrillation/typical atrial flutter/atypical atrial flutter The patient's CHA2DS2-VASc score is 3, indicating a 3.2% annual risk of stroke.   S/p afib and typical flutter ablation with Dr Rayann Heman on 07/16/20. Patient back in rapid atypical  atrial flutter today.  Will resume amiodarone at 200 mg BID for one week then decrease to 200 mg daily. Based on sleep study notes, he seems paroxysmal a this point, will  not arrange DCCV at this time.  Continue Eliquis 5 mg BID with no missed doses for at least 3 months post ablation.  Continue Toprol 100 mg daily Portable ECG tech for home monitoring.   2. Secondary Hypercoagulable State (ICD10:  D68.69) The patient is at significant risk for stroke/thromboembolism based upon his CHA2DS2-VASc Score of 3.  Continue Apixaban (Eliquis).   3. Obesity Body mass index is 35.86 kg/m. Lifestyle modification was discussed and encouraged including regular physical activity and weight reduction.  4. Chronic systolic CHF EF 00% No signs or symptoms of fluid overload.  5. HTN Stable, no changes today.  6. OSA Patient has been referred to Dr Maxwell Caul and diagnosed with severe OSA. Scheduled for CPAP titration.    Patient to call clinic Monday with updated heart rates and ECG strips. If persistent, will arrange for DCCV. Follow up in AF clinic as scheduled.    [facility] [address] [place], [place] [address] [phone] 07/21/2020 2:41 PM

## 2020-07-21 NOTE — Patient Instructions (Signed)
Start Amiodarone 200mg  twice a day for 1 week then reduce to 200mg  once a day   Call on Monday with update of heart rates [phone]

## 2020-07-26 ENCOUNTER — Other Ambulatory Visit: Payer: Self-pay | Admitting: Cardiovascular Disease

## 2020-07-26 NOTE — Telephone Encounter (Signed)
rx refill

## 2020-07-27 ENCOUNTER — Other Ambulatory Visit (HOSPITAL_BASED_OUTPATIENT_CLINIC_OR_DEPARTMENT_OTHER): Payer: Self-pay

## 2020-07-27 DIAGNOSIS — G4733 Obstructive sleep apnea (adult) (pediatric): Secondary | ICD-10-CM

## 2020-07-27 DIAGNOSIS — G4731 Primary central sleep apnea: Secondary | ICD-10-CM

## 2020-07-28 ENCOUNTER — Encounter (HOSPITAL_COMMUNITY): Payer: Self-pay

## 2020-08-02 ENCOUNTER — Other Ambulatory Visit (HOSPITAL_COMMUNITY): Payer: Self-pay | Admitting: *Deleted

## 2020-08-03 DIAGNOSIS — N5201 Erectile dysfunction due to arterial insufficiency: Secondary | ICD-10-CM | POA: Diagnosis not present

## 2020-08-03 DIAGNOSIS — R972 Elevated prostate specific antigen [PSA]: Secondary | ICD-10-CM | POA: Diagnosis not present

## 2020-08-06 ENCOUNTER — Ambulatory Visit (HOSPITAL_BASED_OUTPATIENT_CLINIC_OR_DEPARTMENT_OTHER): Payer: BLUE CROSS/BLUE SHIELD | Attending: Internal Medicine | Admitting: Internal Medicine

## 2020-08-06 ENCOUNTER — Other Ambulatory Visit: Payer: Self-pay

## 2020-08-06 DIAGNOSIS — G4733 Obstructive sleep apnea (adult) (pediatric): Secondary | ICD-10-CM | POA: Insufficient documentation

## 2020-08-06 DIAGNOSIS — G4731 Primary central sleep apnea: Secondary | ICD-10-CM | POA: Diagnosis not present

## 2020-08-12 ENCOUNTER — Telehealth (HOSPITAL_COMMUNITY): Payer: Self-pay | Admitting: *Deleted

## 2020-08-12 MED ORDER — AMIODARONE HCL 200 MG PO TABS: 200.0000 mg | ORAL_TABLET | Freq: Every day | ORAL | 3 refills | Status: DC

## 2020-08-12 NOTE — Telephone Encounter (Signed)
Patient sent in EKG strips from his device showing normal rhythm HR in the 50s. Per Adline Peals can cancel cardioversion keep medications as they are. Call if issues arise prior to follow up with Dr. Rayann Heman. Pt in agreement.

## 2020-08-13 ENCOUNTER — Ambulatory Visit (HOSPITAL_COMMUNITY): Payer: BLUE CROSS/BLUE SHIELD | Admitting: Physician Assistant

## 2020-08-15 DIAGNOSIS — G4733 Obstructive sleep apnea (adult) (pediatric): Secondary | ICD-10-CM | POA: Diagnosis not present

## 2020-08-15 NOTE — Procedures (Signed)
   NAME: Jeffrey Griffin DATE OF BIRTH:  1960-11-12 MEDICAL RECORD NUMBER [id number]  LOCATION: [facility]  PHYSICIAN: Marius Ditch  DATE OF STUDY: 08/06/2020  SLEEP STUDY TYPE: Positive Airway Pressure Titration               REFERRING PHYSICIAN: Marius Ditch, MD  INDICATION FOR STUDY: Severe OSA with 10% central apnea noted. Possible CSR noted on HSAT  EPWORTH SLEEPINESS SCORE:  NA HEIGHT:    WEIGHT:      There is no height or weight on file to calculate BMI.  NECK SIZE: 18 in.  MEDICATIONS Patient self administered medications include: N/A. Medications administered during study include No sleep medicine administered.Marland Kitchen  SLEEP STUDY TECHNIQUE The patient underwent an attended overnight level one polysomnography titration to assess the effects of cpap therapy. The following variables were monitored: EEG (C4-A1, C3-A2, O1-A2, O2-A1), EOG, submental and leg EMG, ECG, oxyhemoglobin saturation by pulse oximetry, thoracic and abdominal respiratory effort belts, nasal/oral airflow by pressure sensor, body position sensor and snoring sensor. CPAP pressure was titrated to eliminate apneas, hypopneas and oxygen desaturation.  TECHNICAL COMMENTS Comments added by Technician: ESON NASAL LARGE MASK WAS USED FOR THIS STUDY Comments added by Scorer: N/A  SLEEP ARCHITECTURE The recording time for the entire night was 423 minutes.   EEG confirmed total sleep time was 405 minutes yielding a sleep efficiency of 93.1%. Sleep onset after lights out was 18.6 minutes with a REM latency of 101 minutes. The patient spent 4% of the night in stage N1 sleep, 66.5% in stage N2 sleep, 0% in stage N3 and 29% in REM. The Arousal Index was 3.7/hour.   RESPIRATORY PARAMETERS The patient was started on CPAP of 5 and changed to BPAP when central events occurred. Central events continued even at maximal pressures.  Oxygen nadir was 83%. Time below 89% was 10.7 minutes  LEG MOVEMENT DATA The  periodic limb movement index was 5.5/hour with an associated arousal index of /hour.  CARDIAC DATA The underlying cardiac rhythm was most consistent with sinus rhythm. Mean heart rate was 100.0 during diagnostic portion and 100.0 during titration portion of study. Additional rhythm abnormalities include Atrial Fibrillation, PVCs.  IMPRESSIONS - Obstructive Sleep apnea (OSA) and Central Sleep Apnea (CSA) - Inadequate titrationg.   DIAGNOSIS - Obstructive and central sleep apnea.   RECOMMENDATIONS - ASV titration recommended.  Marius Ditch Sleep specialist, American Board of Internal Medicine  ELECTRONICALLY SIGNED ON:  08/15/2020, 1:16 PM [place] PH: [phone]   FX: [phone] [place]

## 2020-08-17 ENCOUNTER — Other Ambulatory Visit (HOSPITAL_COMMUNITY): Payer: BLUE CROSS/BLUE SHIELD

## 2020-08-18 ENCOUNTER — Other Ambulatory Visit (HOSPITAL_COMMUNITY): Payer: BLUE CROSS/BLUE SHIELD | Admitting: Physician Assistant

## 2020-08-18 ENCOUNTER — Ambulatory Visit (HOSPITAL_COMMUNITY)
Admission: RE | Admit: 2020-08-18 | Payer: BLUE CROSS/BLUE SHIELD | Source: Home / Self Care | Admitting: Cardiovascular Disease

## 2020-08-18 ENCOUNTER — Encounter (HOSPITAL_COMMUNITY): Admission: RE | Payer: Self-pay | Source: Home / Self Care

## 2020-08-18 SURGERY — CARDIOVERSION
Anesthesia: General

## 2020-09-02 ENCOUNTER — Ambulatory Visit (HOSPITAL_COMMUNITY): Payer: BLUE CROSS/BLUE SHIELD | Admitting: Physician Assistant

## 2020-09-02 DIAGNOSIS — G4733 Obstructive sleep apnea (adult) (pediatric): Secondary | ICD-10-CM | POA: Diagnosis not present

## 2020-09-10 ENCOUNTER — Other Ambulatory Visit: Payer: Self-pay | Admitting: Cardiovascular Disease

## 2020-09-10 NOTE — Telephone Encounter (Signed)
Eliquis 5mg  refill request received. Patient is 60 years old, weight-110.1kg, Crea-1.27 on 06/21/2020, Diagnosis-Afib, and last seen by [place], [place] on 07/21/2020. Dose is appropriate based on dosing criteria. Will send in refill to requested pharmacy.

## 2020-09-12 DIAGNOSIS — H9201 Otalgia, right ear: Secondary | ICD-10-CM | POA: Diagnosis not present

## 2020-09-12 DIAGNOSIS — J069 Acute upper respiratory infection, unspecified: Secondary | ICD-10-CM | POA: Diagnosis not present

## 2020-09-29 ENCOUNTER — Encounter: Payer: Self-pay | Admitting: Cardiovascular Disease

## 2020-09-29 NOTE — Progress Notes (Signed)
Cardiology Office Note:    Date:  10/01/2020   ID:  [name], DOB 1961/08/10, MRN [id number]  PCP:  Alroy Dust, Carlean Jews.Marlou Sa, MD  Cardiologist:  Geraldo Pitter Oakley Kossman  Electrophysiologist:  None   Referring MD: Alroy Dust, Carlean Jews.Marlou Sa, MD   Chief Complaint  Patient presents with  . Atrial Fibrillation    Problem list 1.  Acute pulmonary embolism - Jan. 11, 2021  2.  Atrial fibrillation 3.   Ascending thoracic aortic aneurysm ( 4.3 cm)  4.  Acute systolic congestive heart failure-ejection fraction 20 to 25%.    Previous notes:    Jeffrey Griffin is a 60 y.o. male with a recent hospitalization at [facility] for atrial fibrillation.  He was originally seen down in [facility] but wanted to follow-up in [place].  His symptoms started sometime prior to Christmas.  He presented to the urgent care with 4 days of severe shortness of breath and orthopnea.  He was found to have atrial fibrillation.  CT angiogram of the chest showed acute subsegmental pulmonary embolism to the right upper and right lower lobes.  He also was found to have a 4.3 cm ascending aortic aneurysm and a 4 mm right lung nodule.  No stress test or cath performed.  No angina   Family hx of cardiac disease Mom has IHSS Father had CVAs  Brother had Afib / ablation Sister has HTN   Cannot tell that his HR is very iireg.    Breathing is better.    No leg trauma,  No long trips to explain his pulmonary embolus.    Feeling better.  A bit fatigued Used to drink coffee and has been avoiding it recently   Prior to hospitalization , did not follow any diet .  Takes a salad to work for lunch  . Breakfast is scrambled egg white, with veggies + chicken  Dinner is salad, veggies,   Eats suasage several times a week ,   Works out with a trainer several times a week .   November 10, 2019: [name] is seen back today for follow-up of his atrial fibrillation, congestive heart failure, and pulmonary embolus, and 4.3 cm  ascending aortic aneurysm.  He had a successful cardioversion on October 20, 2019. Feeling much better after cardioversion Still has some episodes of dyspnea.  Has gained weight this past week .   Has been trying to avoid salty foods.  No CP. No palpitations   December 11, 2019:  [name] is seen today for a follow of his atrial fibrillation, congestive heart failure, pulmonary embolus and 4.3 cm ascending aortic aneurysm. Had a successful cardioversion in February, 2021. Occasional dyspnea.  Not with exertion .  Has been working out 3 times a week.    April 06, 2020: [name] is seen today for follow-up of his paroxysmal atrial fibrillation, congestive heart failure, pulmonary loss, and moderate ascending aortic aneurysm.  He had a successful cardioversion in February, 2021. Cannot tell if his HR is regular or not  ( he is back in AF today )  Has changed jobs.  Lost his insurance Now is on new insurance,    Lipids and cmet are reviewed and are improving   Jan. 28, 2022 Jeffrey Griffin is seen today for follow up of his HTN, CHF, PAF, pulmonary embolus, ascending aortic aneurism.  Has some fatigue, HR is slow even when working out  Had Afib ablation Nov. 12, 2021 - is on amio for the next month. Is back on Toprol 100 mg  a day  Is on BIPAP for OSA   CT angio in April 20, 2020 reveals a mild ascending thoracic aortic dilatation at 4.0.  This area previously measured 4.3 cm. He remains on Eliquis for his history of pulmonary emboli.  He remains on Entresto, Toprol-XL for his congestive heart failure.   Past Medical History:  Diagnosis Date  . Abnormality of thoracic aorta    4.3 CM ECTATIC ASENDING   . Cardiopathy   . CHF (congestive heart failure) (HCC)    SYSTOLIC  . Hyperlipidemia   . Hypertension   . Lung nodule   . Obesity   . Persistent atrial fibrillation (HCC)    WITH RVR  . Pleural effusion    RIGHT UPPER LOBE AND RIGHT LOWER LOBE     Past Surgical History:  Procedure  Laterality Date  . APPENDECTOMY    . ATRIAL FIBRILLATION ABLATION N/A 07/16/2020   Procedure: ATRIAL FIBRILLATION ABLATION;  Surgeon: [name], MD;  Location: [facility];  Service: Cardiovascular;  Laterality: N/A;  . CARDIOVERSION N/A 10/20/2019   Procedure: CARDIOVERSION;  Surgeon: Acie Fredrickson Wonda Cheng, MD;  Location: [facility] ENDOSCOPY;  Service: Cardiovascular;  Laterality: N/A;  . CARDIOVERSION N/A 05/31/2020   Procedure: CARDIOVERSION;  Surgeon: Josue Hector, MD;  Location: [facility] ENDOSCOPY;  Service: Cardiovascular;  Laterality: N/A;  . VASECTOMY      Current Medications: Current Meds  Medication Sig  . amiodarone (PACERONE) 200 MG tablet Take 1 tablet (200 mg total) by mouth daily.  Marland Kitchen ascorbic acid (VITAMIN C) 500 MG tablet Take 500 mg by mouth daily.  Marland Kitchen atorvastatin (LIPITOR) 40 MG tablet Take 1 tablet (40 mg total) by mouth daily.  . calcium carbonate (OSCAL) 1500 (600 Ca) MG TABS tablet Take by mouth daily.  . Carnitine-B5-B6 500-15-5 MG TABS Take by mouth.  . Cholecalciferol (VITAMIN D3) 25 MCG (1000 UT) CAPS Take 1,000 Units by mouth daily.   . Citrulline POWD Take by mouth.  Marland Kitchen CREATINE MONOHYDRATE PO Take by mouth.  . Cyanocobalamin (B-12) 5000 MCG CAPS Take by mouth.  . D-Ribose POWD by Does not apply route.  Marland Kitchen ELIQUIS 5 MG TABS tablet TAKE 1 TABLET BY MOUTH TWICE A DAY  . Garlic 123XX123 MG CAPS Take by mouth.  . Glucosamine-Chondroitin (COSAMIN DS PO) Take 2 tablets by mouth daily.  . metoprolol succinate (TOPROL-XL) 100 MG 24 hr tablet Take 1 tablet (100 mg total) by mouth daily. Take with or immediately following a meal.  . Multiple Minerals-Vitamins (CAL MAG ZINC +D3 PO) Take 1 tablet by mouth daily.   . Multiple Vitamin (MULTIVITAMIN WITH MINERALS) TABS tablet Take 1 tablet by mouth daily.  . Omega-3 1400 MG CAPS Take by mouth.  . sacubitril-valsartan (ENTRESTO) 49-51 MG Take 1 tablet by mouth 2 (two) times daily.  . Taurine 1000 MG CAPS Take by mouth.  . vitamin E  180 MG (400 UNITS) capsule Take 400 Units by mouth daily.  Marland Kitchen Zn-Pyg Afri-Nettle-Saw Palmet (SAW PALMETTO COMPLEX PO) Take by mouth.  . [DISCONTINUED] omeprazole (PRILOSEC) 20 MG capsule Take 20 mg by mouth daily.     Allergies:   Patient has no known allergies.   Social History   Socioeconomic History  . Marital status: Married    Spouse name: Not on file  . Number of children: Not on file  . Years of education: Not on file  . Highest education level: Not on file  Occupational History  . Not on file  Tobacco Use  . Smoking status: Never Smoker  . Smokeless tobacco: Never Used  Substance and Sexual Activity  . Alcohol use: Not Currently  . Drug use: No  . Sexual activity: Not on file  Other Topics Concern  . Not on file  Social History Narrative   Lives in [place].   Social Determinants of Health   Financial Resource Strain: Not on file  Food Insecurity: Not on file  Transportation Needs: Not on file  Physical Activity: Not on file  Stress: Not on file  Social Connections: Not on file     Family History: The patient's family history includes Atrial fibrillation in his brother; Hypertension in his father and sister; Hypertrophic cardiomyopathy in his mother; Stroke in his father.  ROS:     EKGs/Labs/Other Studies Reviewed:     EKG:    Recent Labs: 03/30/2020: ALT 22 06/21/2020: BUN 14; Creatinine, Ser 1.27; Hemoglobin 16.7; Platelets 268; Potassium 4.3; Sodium 139  Recent Lipid Panel    Component Value Date/Time   CHOL 150 03/30/2020 0742   TRIG 168 (H) 03/30/2020 0742   HDL 34 (L) 03/30/2020 0742   CHOLHDL 4.4 03/30/2020 0742   CHOLHDL 5.3 (H) 08/14/2015 1015   VLDL 28 08/14/2015 1015   LDLCALC 87 03/30/2020 0742    Physical Exam:    Physical Exam: Blood pressure 132/84, pulse 60, height 5\' 9"  (1.753 m), weight 240 lb 3.2 oz (109 kg), SpO2 96 %.  GEN:  Well nourished, well developed in no acute distress HEENT: Normal NECK: No JVD; No  carotid bruits LYMPHATICS: No lymphadenopathy CARDIAC: RRR , no murmurs, rubs, gallops RESPIRATORY:  Clear to auscultation without rales, wheezing or rhonchi  ABDOMEN: Soft, non-tender, non-distended MUSCULOSKELETAL:  No edema; No deformity  SKIN: Warm and dry NEUROLOGIC:  Alert and oriented x 3   ECG :   ASSESSMENT:    1. Atypical atrial flutter (HCC)   2. Persistent atrial fibrillation ([place])   3. Nonischemic cardiomyopathy ([place])   4. Thoracic aortic aneurysm without rupture (HCC)    PLAN:      1.,.  Atrial fibrillation: He had atrial fibrillation ablation in November, 2021.  He is in normal sinus rhythm today.  He is back on amiodarone for now.  I suspect that the electrophysiology team will stop his amiodarone next month at his next appointment.  Continue with current dose of metoprolol.    2.  Congestive heart failure:   Continue current therapy for his heart failure.  We will repeat his echocardiogram.  As long as his ejection fraction continues to improve we will continue with current medications.  His blood pressure is in the ideal range.  Will consider adding spironolactone if his ejection fraction is still low.   3.  Pulmonary embolus:  .  Continue Eliquis.  4.  Hypertension: Blood pressure is well controlled.   4.  Hyperlipidemia:   Labs have been stable    5.  Ascending aortic aneurism.   CT angiogram revealed an ascending aorta 4.0 cm.  We will repeat the CT angiogram in August.  We will have him follow-up with an APP in 6 months.  I will plan on seeing him in 1 year.  Medication Adjustments/Labs and Tests Ordered: Current medicines are reviewed at length with the patient today.  Concerns regarding medicines are outlined above.  Orders Placed This Encounter  Procedures  . ECHOCARDIOGRAM COMPLETE   No orders of the defined types were placed in this encounter.  Patient Instructions  Medication Instructions:  Your physician recommends that you continue on  your current medications as directed. Please refer to the Current Medication list given to you today.  *If you need a refill on your cardiac medications before your next appointment, please call your pharmacy*   Lab Work: None Ordered If you have labs (blood work) drawn today and your tests are completely normal, you will receive your results only by: Marland Kitchen MyChart Message (if you have MyChart) OR . A paper copy in the mail If you have any lab test that is abnormal or we need to change your treatment, we will call you to review the results.   Testing/Procedures: Your physician has requested that you have an echocardiogram. Echocardiography is a painless test that uses sound waves to create images of your heart. It provides your doctor with information about the size and shape of your heart and how well your heart's chambers and valves are working. This procedure takes approximately one hour. There are no restrictions for this procedure.   You will receive a call to schedule your CT Angio of your Aorta due August 2022   Follow-Up: At [facility], you and your health needs are our priority.  As part of our continuing mission to provide you with exceptional heart care, we have created designated Provider Care Teams.  These Care Teams include your primary Cardiologist (physician) and Advanced Practice Providers (APPs -  Physician Assistants and Nurse Practitioners) who all work together to provide you with the care you need, when you need it.  Your next appointment:   6 month(s)  The format for your next appointment:   In Person  Provider:   You will see one of the following Advanced Practice Providers on your designated Care Team:    Richardson Dopp, PA-C  Robbie Lis, PA-C  Then, [name], MD will plan to see you again in 1 year(s).        Signed, [name], MD  10/01/2020 6:14 PM    [facility]

## 2020-10-01 ENCOUNTER — Ambulatory Visit: Payer: BC Managed Care – PPO | Admitting: Cardiovascular Disease

## 2020-10-01 ENCOUNTER — Encounter: Payer: Self-pay | Admitting: Cardiovascular Disease

## 2020-10-01 ENCOUNTER — Other Ambulatory Visit: Payer: Self-pay

## 2020-10-01 VITALS — BP 132/84 | HR 60 | Ht 69.0 in | Wt 240.2 lb

## 2020-10-01 DIAGNOSIS — I712 Thoracic aortic aneurysm, without rupture, unspecified: Secondary | ICD-10-CM

## 2020-10-01 DIAGNOSIS — I428 Other cardiomyopathies: Secondary | ICD-10-CM | POA: Diagnosis not present

## 2020-10-01 DIAGNOSIS — I484 Atypical atrial flutter: Secondary | ICD-10-CM

## 2020-10-01 DIAGNOSIS — I4819 Other persistent atrial fibrillation: Secondary | ICD-10-CM | POA: Diagnosis not present

## 2020-10-01 DIAGNOSIS — I5042 Chronic combined systolic (congestive) and diastolic (congestive) heart failure: Secondary | ICD-10-CM

## 2020-10-01 NOTE — Patient Instructions (Signed)
Medication Instructions:  Your physician recommends that you continue on your current medications as directed. Please refer to the Current Medication list given to you today.  *If you need a refill on your cardiac medications before your next appointment, please call your pharmacy*   Lab Work: None Ordered If you have labs (blood work) drawn today and your tests are completely normal, you will receive your results only by: Marland Kitchen MyChart Message (if you have MyChart) OR . A paper copy in the mail If you have any lab test that is abnormal or we need to change your treatment, we will call you to review the results.   Testing/Procedures: Your physician has requested that you have an echocardiogram. Echocardiography is a painless test that uses sound waves to create images of your heart. It provides your doctor with information about the size and shape of your heart and how well your heart's chambers and valves are working. This procedure takes approximately one hour. There are no restrictions for this procedure.   You will receive a call to schedule your CT Angio of your Aorta due August 2022   Follow-Up: At [facility], you and your health needs are our priority.  As part of our continuing mission to provide you with exceptional heart care, we have created designated Provider Care Teams.  These Care Teams include your primary Cardiologist (physician) and Advanced Practice Providers (APPs -  Physician Assistants and Nurse Practitioners) who all work together to provide you with the care you need, when you need it.  Your next appointment:   6 month(s)  The format for your next appointment:   In Person  Provider:   You will see one of the following Advanced Practice Providers on your designated Care Team:    Richardson Dopp, PA-C  Robbie Lis, PA-C  Then, [name], MD will plan to see you again in 1 year(s).

## 2020-10-03 DIAGNOSIS — G4733 Obstructive sleep apnea (adult) (pediatric): Secondary | ICD-10-CM | POA: Diagnosis not present

## 2020-10-04 ENCOUNTER — Other Ambulatory Visit (HOSPITAL_COMMUNITY): Payer: Self-pay | Admitting: *Deleted

## 2020-10-04 MED ORDER — AMIODARONE HCL 200 MG PO TABS
200.0000 mg | ORAL_TABLET | Freq: Every day | ORAL | 3 refills | Status: DC
Start: 2020-10-04 — End: 2020-10-18

## 2020-10-08 ENCOUNTER — Other Ambulatory Visit: Payer: Self-pay

## 2020-10-08 ENCOUNTER — Other Ambulatory Visit: Payer: Self-pay | Admitting: Cardiovascular Disease

## 2020-10-08 MED ORDER — METOPROLOL SUCCINATE ER 100 MG PO TB24: 100.0000 mg | ORAL_TABLET | Freq: Every day | ORAL | 3 refills | Status: DC

## 2020-10-18 ENCOUNTER — Ambulatory Visit: Payer: BC Managed Care – PPO | Admitting: Internal Medicine

## 2020-10-18 ENCOUNTER — Other Ambulatory Visit: Payer: Self-pay

## 2020-10-18 ENCOUNTER — Encounter: Payer: Self-pay | Admitting: Internal Medicine

## 2020-10-18 VITALS — BP 156/86 | HR 58 | Ht 69.0 in | Wt 243.0 lb

## 2020-10-18 DIAGNOSIS — I428 Other cardiomyopathies: Secondary | ICD-10-CM | POA: Diagnosis not present

## 2020-10-18 DIAGNOSIS — D6869 Other thrombophilia: Secondary | ICD-10-CM | POA: Diagnosis not present

## 2020-10-18 DIAGNOSIS — G4733 Obstructive sleep apnea (adult) (pediatric): Secondary | ICD-10-CM | POA: Diagnosis not present

## 2020-10-18 DIAGNOSIS — I4819 Other persistent atrial fibrillation: Secondary | ICD-10-CM | POA: Diagnosis not present

## 2020-10-18 MED ORDER — METOPROLOL SUCCINATE ER 50 MG PO TB24: 50.0000 mg | ORAL_TABLET | Freq: Every day | ORAL | 3 refills | Status: DC

## 2020-10-18 NOTE — Progress Notes (Signed)
PCP: Alroy Dust, L.Marlou Sa, MD Primary Cardiologist: Dr Acie Fredrickson  Jeffrey Griffin is a 60 y.o. male who presents today for routine electrophysiology followup.  Since his recent afib ablation, the patient reports doing very well.  he denies procedure related complications and is pleased with the results of the procedure.  He had ERAF early and was restarted on amiodarone.  He has also been diagnosed with OSA.  He is using CPAP.  He denies CP, SOB, odynophagia or other concerns.  Past Medical History:  Diagnosis Date  . Abnormality of thoracic aorta    4.3 CM ECTATIC ASENDING   . Cardiopathy   . CHF (congestive heart failure) (HCC)    SYSTOLIC  . Hyperlipidemia   . Hypertension   . Lung nodule   . Obesity   . Persistent atrial fibrillation (HCC)    WITH RVR  . Pleural effusion    RIGHT UPPER LOBE AND RIGHT LOWER LOBE    Past Surgical History:  Procedure Laterality Date  . APPENDECTOMY    . ATRIAL FIBRILLATION ABLATION N/A 07/16/2020   Procedure: ATRIAL FIBRILLATION ABLATION;  Surgeon: [name], MD;  Location: [facility];  Service: Cardiovascular;  Laterality: N/A;  . CARDIOVERSION N/A 10/20/2019   Procedure: CARDIOVERSION;  Surgeon: Thayer Headings, MD;  Location: [facility] ENDOSCOPY;  Service: Cardiovascular;  Laterality: N/A;  . CARDIOVERSION N/A 05/31/2020   Procedure: CARDIOVERSION;  Surgeon: Josue Hector, MD;  Location: [facility] ENDOSCOPY;  Service: Cardiovascular;  Laterality: N/A;  . VASECTOMY      ROS- all systems are personally reviewed and negatives except as per HPI above  Current Outpatient Medications  Medication Sig Dispense Refill  . amiodarone (PACERONE) 200 MG tablet Take 1 tablet (200 mg total) by mouth daily. 30 tablet 3  . ascorbic acid (VITAMIN C) 500 MG tablet Take 500 mg by mouth daily.    Marland Kitchen atorvastatin (LIPITOR) 40 MG tablet Take 1 tablet (40 mg total) by mouth daily. 90 tablet 3  . calcium carbonate (OSCAL) 1500 (600 Ca) MG TABS tablet Take by mouth daily.     . Carnitine-B5-B6 500-15-5 MG TABS Take by mouth.    . Cholecalciferol (VITAMIN D3) 25 MCG (1000 UT) CAPS Take 1,000 Units by mouth daily.     . Citrulline POWD Take by mouth.    Marland Kitchen CREATINE MONOHYDRATE PO Take by mouth.    . Cyanocobalamin (B-12) 5000 MCG CAPS Take by mouth.    . D-Ribose POWD by Does not apply route.    Marland Kitchen ELIQUIS 5 MG TABS tablet TAKE 1 TABLET BY MOUTH TWICE A DAY 60 tablet 10  . Garlic 3557 MG CAPS Take by mouth.    . Glucosamine-Chondroitin (COSAMIN DS PO) Take 2 tablets by mouth daily.    . metoprolol succinate (TOPROL-XL) 100 MG 24 hr tablet TAKE ONE TABLET BY MOUTH DAILY WITH A MEAL 90 tablet 3  . metoprolol succinate (TOPROL-XL) 100 MG 24 hr tablet Take 1 tablet (100 mg total) by mouth daily. Take with or immediately following a meal. 90 tablet 3  . Multiple Minerals-Vitamins (CAL MAG ZINC +D3 PO) Take 1 tablet by mouth daily.     . Multiple Vitamin (MULTIVITAMIN WITH MINERALS) TABS tablet Take 1 tablet by mouth daily.    . Omega-3 1400 MG CAPS Take by mouth.    . sacubitril-valsartan (ENTRESTO) 49-51 MG Take 1 tablet by mouth 2 (two) times daily. 60 tablet 11  . Taurine 1000 MG CAPS Take by mouth.    Marland Kitchen  vitamin E 180 MG (400 UNITS) capsule Take 400 Units by mouth daily.    Marland Kitchen Zn-Pyg Afri-Nettle-Saw Palmet (SAW PALMETTO COMPLEX PO) Take by mouth.     No current facility-administered medications for this visit.    Physical Exam: Vitals:   10/18/20 0822  BP: (!) 156/86  Pulse: (!) 58  SpO2: 92%  Weight: 243 lb (110.2 kg)  Height: 5\' 9"  (1.753 m)    GEN- The patient is well appearing, alert and oriented x 3 today.   Head- normocephalic, atraumatic Eyes-  Sclera clear, conjunctiva pink Ears- hearing intact Oropharynx- clear Lungs-   normal work of breathing Heart- Regular rate and rhythm  GI- soft  Extremities- no clubbing, cyanosis, or edema  EKG tracing ordered today is personally reviewed and shows sinus  Assessment and Plan:  1. Persistent  atrial fibrillation and atrial flutter Doing well s/p ablation He did have ERAF early and was started back on amiodarone.  He has severe LA enlargement. chads2vasc score is 3.  Continue eliquis stop amiodarone reduce toprol to 50mg  daily  2. Severe OSA Recently diagnosed He is compliant with CPAP  3. Tachycardia mediated CM Reassess EF in 3 months  4. HTN Elevated today Repeat bp by me was 144/86 mm Hg No change required today  Return to see me in 3 months We will repeat his echo if he remains in sinus at that time.  [name] MD, [facility] 10/18/2020 8:41 AM

## 2020-10-18 NOTE — Patient Instructions (Addendum)
Medication Instructions:  Stop Amiodarone  Reduce your Metoprolol Succinate to 50 mg daily  Your physician recommends that you continue on your current medications as directed. Please refer to the Current Medication list given to you today.  Labwork: None ordered.  Testing/Procedures: None ordered.  Follow-Up: Your physician wants you to follow-up in: 3 month follow up with Dr. Rayann Heman. [place] to call with early appt. Times.    Any Other Special Instructions Will Be Listed Below (If Applicable).  If you need a refill on your cardiac medications before your next appointment, please call your pharmacy.

## 2020-10-22 ENCOUNTER — Other Ambulatory Visit: Payer: Self-pay

## 2020-10-22 ENCOUNTER — Ambulatory Visit (HOSPITAL_COMMUNITY): Payer: BC Managed Care – PPO | Attending: Internal Medicine

## 2020-10-22 DIAGNOSIS — I712 Thoracic aortic aneurysm, without rupture, unspecified: Secondary | ICD-10-CM

## 2020-10-22 DIAGNOSIS — I484 Atypical atrial flutter: Secondary | ICD-10-CM | POA: Diagnosis not present

## 2020-10-22 DIAGNOSIS — I4819 Other persistent atrial fibrillation: Secondary | ICD-10-CM | POA: Insufficient documentation

## 2020-10-22 DIAGNOSIS — I428 Other cardiomyopathies: Secondary | ICD-10-CM

## 2020-10-22 LAB — ECHOCARDIOGRAM COMPLETE
Area-P 1/2: 2.42 cm2
S' Lateral: 3.8 cm

## 2020-10-26 ENCOUNTER — Other Ambulatory Visit: Payer: Self-pay | Admitting: Cardiovascular Disease

## 2020-11-01 DIAGNOSIS — G4733 Obstructive sleep apnea (adult) (pediatric): Secondary | ICD-10-CM | POA: Diagnosis not present

## 2020-12-01 DIAGNOSIS — G4733 Obstructive sleep apnea (adult) (pediatric): Secondary | ICD-10-CM | POA: Diagnosis not present

## 2020-12-12 ENCOUNTER — Other Ambulatory Visit: Payer: Self-pay | Admitting: Cardiovascular Disease

## 2021-01-01 DIAGNOSIS — G4733 Obstructive sleep apnea (adult) (pediatric): Secondary | ICD-10-CM | POA: Diagnosis not present

## 2021-01-10 ENCOUNTER — Ambulatory Visit: Payer: BC Managed Care – PPO | Admitting: Internal Medicine

## 2021-01-13 NOTE — Progress Notes (Signed)
PCP:  Alroy Dust, L.Marlou Sa, MD Primary Cardiologist: [name], MD Electrophysiologist: [name], MD   Jeffrey Griffin is a 60 y.o. male seen today for [name], MD for routine electrophysiology followup.  Since last being seen in our clinic the patient reports doing very well. 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 He has intermittent palpitations, but [facility] EKG has demonstrated NSR each time he has checked it. Occasionally he will feel "winded" at odd times with less activity than expected, but usually he can work with his trainer without any undue SOB. Denies lightheadedness, dizziness, near syncope, or syncope.   Past Medical History:  Diagnosis Date  . Abnormality of thoracic aorta    4.3 CM ECTATIC ASENDING   . Cardiopathy   . CHF (congestive heart failure) (HCC)    SYSTOLIC  . Hyperlipidemia   . Hypertension   . Lung nodule   . Obesity   . Persistent atrial fibrillation (HCC)    WITH RVR  . Pleural effusion    RIGHT UPPER LOBE AND RIGHT LOWER LOBE    Past Surgical History:  Procedure Laterality Date  . APPENDECTOMY    . ATRIAL FIBRILLATION ABLATION N/A 07/16/2020   Procedure: ATRIAL FIBRILLATION ABLATION;  Surgeon: [name], MD;  Location: [place] [facility];  Service: Cardiovascular;  Laterality: N/A;  . CARDIOVERSION N/A 10/20/2019   Procedure: CARDIOVERSION;  Surgeon: Thayer Headings, MD;  Location: [facility] ENDOSCOPY;  Service: Cardiovascular;  Laterality: N/A;  . CARDIOVERSION N/A 05/31/2020   Procedure: CARDIOVERSION;  Surgeon: Josue Hector, MD;  Location: [facility] ENDOSCOPY;  Service: Cardiovascular;  Laterality: N/A;  . VASECTOMY      Current Outpatient Medications  Medication Sig Dispense Refill  . ascorbic acid (VITAMIN C) 500 MG tablet Take 500 mg by mouth daily.    Marland Kitchen atorvastatin (LIPITOR) 40 MG tablet Take 1 tablet (40 mg total) by mouth daily. 90 tablet 3  . calcium carbonate (OSCAL) 1500 (600  Ca) MG TABS tablet Take by mouth daily.    . Carnitine-B5-B6 500-15-5 MG TABS Take by mouth.    . Cholecalciferol (VITAMIN D3) 25 MCG (1000 UT) CAPS Take 1,000 Units by mouth daily.     . Citrulline POWD Take by mouth.    Marland Kitchen CREATINE MONOHYDRATE PO Take by mouth.    . Cyanocobalamin (B-12) 5000 MCG CAPS Take by mouth.    . D-Ribose POWD by Does not apply route.    Marland Kitchen ELIQUIS 5 MG TABS tablet TAKE 1 TABLET BY MOUTH TWICE A DAY 60 tablet 10  . ENTRESTO 49-51 MG TAKE 1 TABLET BY MOUTH 2 (TWO) TIMES DAILY. 180 tablet 1  . Garlic 8299 MG CAPS Take by mouth.    . Glucosamine-Chondroitin (COSAMIN DS PO) Take 2 tablets by mouth daily.    . metoprolol succinate (TOPROL-XL) 50 MG 24 hr tablet Take 1 tablet (50 mg total) by mouth daily. Take with or immediately following a meal. 90 tablet 3  . Multiple Minerals-Vitamins (CAL MAG ZINC +D3 PO) Take 1 tablet by mouth daily.     . Multiple Vitamin (MULTIVITAMIN WITH MINERALS) TABS tablet Take 1 tablet by mouth daily.    . Omega-3 1400 MG CAPS Take by mouth.    . Taurine 1000 MG CAPS Take by mouth.    . vitamin E 180 MG (400 UNITS) capsule Take 400 Units by mouth daily.    Marland Kitchen Zn-Pyg Afri-Nettle-Saw Palmet (SAW PALMETTO COMPLEX  PO) Take by mouth.     No current facility-administered medications for this visit.    No Known Allergies  Social History   Socioeconomic History  . Marital status: Married    Spouse name: Not on file  . Number of children: Not on file  . Years of education: Not on file  . Highest education level: Not on file  Occupational History  . Not on file  Tobacco Use  . Smoking status: Never Smoker  . Smokeless tobacco: Never Used  Substance and Sexual Activity  . Alcohol use: Not Currently  . Drug use: No  . Sexual activity: Not on file  Other Topics Concern  . Not on file  Social History Narrative   Lives in [place].   Social Determinants of Health   Financial Resource Strain: Not on file  Food Insecurity: Not  on file  Transportation Needs: Not on file  Physical Activity: Not on file  Stress: Not on file  Social Connections: Not on file  Intimate Partner Violence: Not on file     Review of Systems: General: No chills, fever, night sweats or weight changes  Cardiovascular:  No chest pain, dyspnea on exertion, edema, orthopnea, palpitations, paroxysmal nocturnal dyspnea Dermatological: No rash, lesions or masses Respiratory: No cough, dyspnea Urologic: No hematuria, dysuria Abdominal: No nausea, vomiting, diarrhea, bright red blood per rectum, melena, or hematemesis Neurologic: No visual changes, weakness, changes in mental status All other systems reviewed and are otherwise negative except as noted above.  Physical Exam: Vitals:   01/14/21 0816  BP: 130/80  Pulse: 72  Weight: 245 lb (111.1 kg)  Height: 5\' 9"  (1.753 m)    GEN- The patient is well appearing, alert and oriented x 3 today.   HEENT: normocephalic, atraumatic; sclera clear, conjunctiva pink; hearing intact; oropharynx clear; neck supple, no JVP Lymph- no cervical lymphadenopathy Lungs- Clear to ausculation bilaterally, normal work of breathing.  No wheezes, rales, rhonchi Heart- Regular rate and rhythm, no murmurs, rubs or gallops, PMI not laterally displaced GI- soft, non-tender, non-distended, bowel sounds present, no hepatosplenomegaly Extremities- no clubbing, cyanosis, or edema; DP/PT/radial pulses 2+ bilaterally MS- no significant deformity or atrophy Skin- warm and dry, no rash or lesion Psych- euthymic mood, full affect Neuro- strength and sensation are intact  EKG is ordered. Personal review of EKG from today shows NSR at 72 bpm  Additional studies reviewed include: Previous EP office notes. Echo 10/2020  Assessment and Plan:  1. Persistent atrial fibrillation and atrial flutter Doing well s/p ablation He did have ERAF early and was started back on amiodarone.  He has severe LA enlargement. Continue  eliquis for CHA2DS2VASC of at least 3.   Continue toprol 50mg  daily  2. Severe OSA Encouraged nightly BiPAP  3. Tachycardia mediated CM Echo 10/22/20 shows normalization of EF s/p AF ablation and maintenance of NSR Continue Entresto and Toprol  4. HTN Continue current medications  5. Morbid obesity Body mass index is 36.18 kg/m.   Shirley Friar, PA-C  01/14/21 8:35 AM

## 2021-01-14 ENCOUNTER — Encounter: Payer: Self-pay | Admitting: Student

## 2021-01-14 ENCOUNTER — Other Ambulatory Visit: Payer: Self-pay

## 2021-01-14 ENCOUNTER — Ambulatory Visit (INDEPENDENT_AMBULATORY_CARE_PROVIDER_SITE_OTHER): Payer: BC Managed Care – PPO | Admitting: Student

## 2021-01-14 VITALS — BP 130/80 | HR 72 | Ht 69.0 in | Wt 245.0 lb

## 2021-01-14 DIAGNOSIS — I10 Essential (primary) hypertension: Secondary | ICD-10-CM

## 2021-01-14 DIAGNOSIS — I428 Other cardiomyopathies: Secondary | ICD-10-CM | POA: Diagnosis not present

## 2021-01-14 DIAGNOSIS — G4733 Obstructive sleep apnea (adult) (pediatric): Secondary | ICD-10-CM | POA: Diagnosis not present

## 2021-01-14 DIAGNOSIS — I4819 Other persistent atrial fibrillation: Secondary | ICD-10-CM | POA: Diagnosis not present

## 2021-01-14 LAB — CBC
Hematocrit: 45.1 % (ref 37.5–51.0)
Hemoglobin: 15.1 g/dL (ref 13.0–17.7)
MCH: 30.2 pg (ref 26.6–33.0)
MCHC: 33.5 g/dL (ref 31.5–35.7)
MCV: 90 fL (ref 79–97)
Platelets: 222 10*3/uL (ref 150–450)
RBC: 5 x10E6/uL (ref 4.14–5.80)
RDW: 11.8 % (ref 11.6–15.4)
WBC: 6 10*3/uL (ref 3.4–10.8)

## 2021-01-14 LAB — BASIC METABOLIC PANEL
BUN/Creatinine Ratio: 18 (ref 9–20)
BUN: 19 mg/dL (ref 6–24)
CO2: 23 mmol/L (ref 20–29)
Calcium: 10.6 mg/dL — ABNORMAL HIGH (ref 8.7–10.2)
Chloride: 102 mmol/L (ref 96–106)
Creatinine, Ser: 1.04 mg/dL (ref 0.76–1.27)
Glucose: 103 mg/dL — ABNORMAL HIGH (ref 65–99)
Potassium: 4.4 mmol/L (ref 3.5–5.2)
Sodium: 139 mmol/L (ref 134–144)
eGFR: 84 mL/min/{1.73_m2} (ref >59)

## 2021-01-14 NOTE — Patient Instructions (Addendum)
Medication Instructions:  Your physician recommends that you continue on your current medications as directed. Please refer to the Current Medication list given to you today.  *If you need a refill on your cardiac medications before your next appointment, please call your pharmacy*   Lab Work: TODAY: BMET, CBC  If you have labs (blood work) drawn today and your tests are completely normal, you will receive your results only by: . MyChart Message (if you have MyChart) OR . A paper copy in the mail If you have any lab test that is abnormal or we need to change your treatment, we will call you to review the results.   Follow-Up: At [facility], you and your health needs are our priority.  As part of our continuing mission to provide you with exceptional heart care, we have created designated Provider Care Teams.  These Care Teams include your primary Cardiologist (physician) and Advanced Practice Providers (APPs -  Physician Assistants and Nurse Practitioners) who all work together to provide you with the care you need, when you need it.  Your next appointment:   6 month(s)  The format for your next appointment:   In Person  Provider:   You may see James Allred, MD or one of the following Advanced Practice Providers on your designated Care Team:    Amber Seiler, NP  Renee Ursuy, PA-C  Michael "Andy" Tillery, PA-C    

## 2021-01-25 ENCOUNTER — Ambulatory Visit: Payer: BC Managed Care – PPO | Admitting: Family Medicine

## 2021-01-28 ENCOUNTER — Other Ambulatory Visit: Payer: Self-pay

## 2021-01-28 ENCOUNTER — Ambulatory Visit: Payer: BC Managed Care – PPO | Admitting: Family Medicine

## 2021-01-28 ENCOUNTER — Encounter: Payer: Self-pay | Admitting: Family Medicine

## 2021-01-28 VITALS — BP 140/80 | HR 75 | Ht 69.0 in | Wt 245.0 lb

## 2021-01-28 DIAGNOSIS — M25512 Pain in left shoulder: Secondary | ICD-10-CM | POA: Diagnosis not present

## 2021-01-28 DIAGNOSIS — Z114 Encounter for screening for human immunodeficiency virus [HIV]: Secondary | ICD-10-CM

## 2021-01-28 DIAGNOSIS — Z1159 Encounter for screening for other viral diseases: Secondary | ICD-10-CM

## 2021-01-28 DIAGNOSIS — Z8639 Personal history of other endocrine, nutritional and metabolic disease: Secondary | ICD-10-CM | POA: Diagnosis not present

## 2021-01-28 MED ORDER — DICLOFENAC SODIUM 1 % EX GEL: 4.0000 g | Freq: Four times a day (QID) | CUTANEOUS | 0 refills | Status: DC

## 2021-01-28 NOTE — Assessment & Plan Note (Signed)
Physical exam suggestive of rotator cuff dysfunction of the left shoulder. Currently working with Public relations account executive for strengthening and adjustments. Patient counseled to continue current exercises and stretches. Feel patient will benefit from further in-depth evaluation with sports medicine and possible PT in the future if warranted. - Referral to sports medicine placed - Voltaren gel PRN - Ibuprofen/tylenol PRN for significant pain - Return precautions given

## 2021-01-28 NOTE — Patient Instructions (Addendum)
You are plugged in with all of the appropriate specialist at this time.  With your left shoulder pain, I think we should try management with conservative measures including Voltaren gel and Tylenol/ibuprofen as needed for the shoulder pain.  I am sending a referral to sports medicine for further evaluation and consideration of possible injections if appropriate.  We can also consider referring to physical therapy if that does not work.  You are already working on stretches and exercises with your personal trainer, I would make sure to keep up with those.     Today we will check HIV, hep C, vitamin D level.  HIV and hepatitis C are the screenings that we recommend in general for adult populations.  I will either call or message you on MyChart regarding results and if there are any actions we need to do afterwards.

## 2021-01-28 NOTE — Progress Notes (Signed)
Subjective:    Patient ID: Jeffrey Griffin, male    DOB: [birth year], 60 y.o.   MRN: [id number]   CC: New Patient  HPI: Left shoulder pain Patient reports sudden onset of left shoulder pain for the last 6 to 8 weeks without known injury.  He stated he noticed it when he was working out and is specifically worse with internal rotation movements.  Patient is currently working with a Public relations account executive, he was noticing improvement previously but feels that has now plateaued.  Pain is also worse if he rolls over onto his side during sleep, though that does not happen often as he sleeps with a CPAP.  Patient has a history of similar right shoulder pain, at that time he was having issues with insurance and was unable to do physical therapy and instead went for surgery.  Patient reports his shoulder was "cleaned out" and that part of his collarbone was either removed or shaved down.  He was having pain afterwards and has been working with a Restaurant manager, fast food and his Physiological scientist with significant improvement.   PMHx: Past Medical History:  Diagnosis Date  . Abnormality of thoracic aorta    4.3 CM ECTATIC ASENDING   . Cardiopathy   . CHF (congestive heart failure) (HCC)    SYSTOLIC  . Hyperlipidemia   . Hypertension   . Lung nodule   . Obesity   . Persistent atrial fibrillation (HCC)    WITH RVR  . Pleural effusion    RIGHT UPPER LOBE AND RIGHT LOWER LOBE      Surgical Hx: Past Surgical History:  Procedure Laterality Date  . APPENDECTOMY    . ATRIAL FIBRILLATION ABLATION N/A 07/16/2020   Procedure: ATRIAL FIBRILLATION ABLATION;  Surgeon: [name], MD;  Location: [place] [facility];  Service: Cardiovascular;  Laterality: N/A;  . CARDIOVERSION N/A 10/20/2019   Procedure: CARDIOVERSION;  Surgeon: Thayer Headings, MD;  Location: [place];  Service: Cardiovascular;  Laterality: N/A;  . CARDIOVERSION N/A 05/31/2020   Procedure: CARDIOVERSION;  Surgeon: Josue Hector,  MD;  Location: [facility] ENDOSCOPY;  Service: Cardiovascular;  Laterality: N/A;  . VASECTOMY       Family Hx: Family History  Problem Relation Age of Onset  . Hypertrophic cardiomyopathy Mother   . Stroke Father   . Hypertension Father   . Hypertension Sister   . 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  ascorbic acid (VITAMIN C) 500 MG tablet, Take 500 mg by mouth daily., Disp: , Rfl:  .  atorvastatin (LIPITOR) 40 MG tablet, Take 1 tablet (40 mg total) by mouth daily., Disp: 90 tablet, Rfl: 3 .  Carnitine-B5-B6 500-15-5 MG TABS, Take by mouth., Disp: , Rfl:  .  Cholecalciferol (VITAMIN D3) 25 MCG (1000 UT) CAPS, Take 1,000 Units by mouth daily. , Disp: , Rfl:  .  Citrulline POWD, Take by mouth., Disp: , Rfl:  .  CREATINE MONOHYDRATE PO, Take by mouth., Disp: ,  Rfl:  .  Cyanocobalamin (B-12) 5000 MCG CAPS, Take by mouth., Disp: , Rfl:  .  D-Ribose POWD, by Does not apply route., Disp: , Rfl:  .  diclofenac Sodium (VOLTAREN) 1 % GEL, Apply 4 g topically 4 (four) times daily., Disp: 350 g, Rfl: 0 .  ELIQUIS 5 MG TABS tablet, TAKE 1 TABLET BY MOUTH TWICE A DAY, Disp: 60 tablet, Rfl: 10 .  ENTRESTO 49-51 MG, TAKE 1 TABLET BY MOUTH 2 (TWO) TIMES DAILY., Disp: 180 tablet, Rfl: 1 .  Garlic 9470 MG CAPS, Take by mouth., Disp: , Rfl:  .  Glucosamine-Chondroitin (COSAMIN DS PO), Take 2 tablets by mouth daily., Disp: , Rfl:  .  metoprolol succinate (TOPROL-XL) 50 MG 24 hr  tablet, Take 1 tablet (50 mg total) by mouth daily. Take with or immediately following a meal., Disp: 90 tablet, Rfl: 3 .  Multiple Minerals-Vitamins (CAL MAG ZINC +D3 PO), Take 1 tablet by mouth daily. , Disp: , Rfl:  .  Multiple Vitamin (MULTIVITAMIN WITH MINERALS) TABS tablet, Take 1 tablet by mouth daily., Disp: , Rfl:  .  Omega-3 1400 MG CAPS, Take by mouth., Disp: , Rfl:  .  Taurine 1000 MG CAPS, Take by mouth., Disp: , Rfl:  .  vitamin E 180 MG (400 UNITS) capsule, Take 400 Units by mouth daily., Disp: , Rfl:  .  Zn-Pyg Afri-Nettle-Saw Palmet (SAW PALMETTO COMPLEX PO), Take by mouth., Disp: , Rfl:   Preventative Screening Colonoscopy: 2017 - 1 polyp removed, 10y f/u PSA: 2021 - elevated (will get records) Tetanus vaccine: 08/04/2015 Shingles vaccine: Patient declines Heart stress test: 11/26/2019 - no ischemia, apical thinning, EF improving Echocardiogram: 10/22/20: normal LV systolic function, Grade 2 diastolic dysfunction CT/MRI: 04/20/2020 - ascending thoracic aorta 4.0cm. Stable 62mm RLL pulmonary nodule. Unchanged 0.9cm low-density lesion within midpole of right kidney (proteinaceous vs hemorrhagic cyst)   Objective:  BP 140/80   Pulse 75   Ht 5\' 9"  (1.753 m)   Wt 245 lb (111.1 kg)   SpO2 96%   BMI 36.18 kg/m  Vitals and nursing note reviewed  General: well nourished, NAD HEENT: normocephalic, TM's visualized bilaterally, no scleral icterus or conjunctival pallor, no nasal discharge, moist mucous membranes, good dentition without erythema or discharge noted in posterior oropharynx Neck: supple, non-tender, without lymphadenopathy Cardiac: RRR, clear S1 and S2, no murmurs, rubs, or gallops Respiratory: clear to auscultation bilaterally, no increased work of breathing Abdomen: soft, nontender, nondistended, no masses or organomegaly. Bowel sounds present Extremities: no edema or cyanosis. Warm, well perfused. 2+ radial and PT pulses bilaterally Skin: warm and dry, no rashes  noted Neuro: alert and oriented, no focal deficits, 5/5 strength in b/l upper and lower extremities MSK: B/l AC joints without crepitus. No asymmetry of shoulders noted, no skin lesions. Limited ROM of left shoulder with internal rotation, adduction, extension  - Empty can test positive on left shoulder  - Apley scratch test with limited ROM with internal rotation and adduction  - Hawkins-Kennedy test positive on left shoulder   Assessment & Plan:   Acute pain of left shoulder Physical exam suggestive of rotator cuff dysfunction of the left shoulder. Currently working with Public relations account executive for strengthening and adjustments. Patient counseled to continue current exercises and stretches. Feel patient will benefit from further in-depth evaluation with sports medicine and possible PT in the future if warranted. - Referral to sports medicine placed - Voltaren gel PRN - Ibuprofen/tylenol PRN for significant pain - Return precautions  given  Atrial fibrillation S/p ablation procedure, currently on Eliquis, Entresto and metoprolol. Managed by cardiology. - continue current medications per cardiology   History of Vitamin D deficiency Patient reported history of vitamin D deficiency previously requiring high dose prescription.  - vit D level drawn today  Healthcare maintenance - HIV and Hep C screening today   Return in about 4 weeks (around 02/25/2021) for shoulder pain follow-up.   [name], DO, PGY-1

## 2021-01-29 LAB — HCV AB W REFLEX TO QUANT PCR: HCV Ab: <0.1 s/co ratio (ref 0.0–0.9)

## 2021-01-29 LAB — VITAMIN D 25 HYDROXY (VIT D DEFICIENCY, FRACTURES): Vit D, 25-Hydroxy: 42.8 ng/mL (ref 30.0–100.0)

## 2021-01-29 LAB — HIV ANTIBODY (ROUTINE TESTING W REFLEX): HIV Screen 4th Generation wRfx: NONREACTIVE

## 2021-01-29 LAB — HCV INTERPRETATION

## 2021-01-31 DIAGNOSIS — G4733 Obstructive sleep apnea (adult) (pediatric): Secondary | ICD-10-CM | POA: Diagnosis not present

## 2021-02-02 DIAGNOSIS — R972 Elevated prostate specific antigen [PSA]: Secondary | ICD-10-CM | POA: Diagnosis not present

## 2021-02-03 ENCOUNTER — Other Ambulatory Visit: Payer: Self-pay

## 2021-02-03 ENCOUNTER — Ambulatory Visit: Payer: BC Managed Care – PPO | Admitting: Family Medicine

## 2021-02-03 DIAGNOSIS — M25512 Pain in left shoulder: Secondary | ICD-10-CM | POA: Diagnosis not present

## 2021-02-03 MED ORDER — METHYLPREDNISOLONE ACETATE 40 MG/ML IJ SUSP
40.0000 mg | Freq: Once | INTRAMUSCULAR | Status: AC
  Administered 2021-02-03: 40 mg via INTRA_ARTICULAR

## 2021-02-03 NOTE — Progress Notes (Signed)
Office Visit Note   Patient: Jeffrey Griffin           Date of Birth: 06-19-1961           MRN: [id number] Visit Date: 02/03/2021 Requested by: Zenia Resides, MD [place],  [place] [address] PCP: [name], DO  Subjective: CC: Left Shoulder Pain  HPI: 60 year old male presented to clinic with concerns of subacute left shoulder pain.  Patient is unsure what he did to initially cause the symptoms, and denies any overt trauma.  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  He started to notice left shoulder pain several months ago when he was doing over the head press, and it has progressively worsened since this time.  Pain is primarily with "using my arm at all" and is causing difficulty at work.  He also has significant pain if he rolls onto his side at night.  He has not tried any exercises or had any imaging thus far.  He states that he has a history of right shoulder injury, requiring a distal clavicle excision.  He is nervous that he may have the same pathology on the left.  Pain is primarily located on the top of his shoulder, though will radiate throughout his shoulder and superior trapezius.  No numbness or tingling in the left arm.              ROS:   All other systems were reviewed and are negative.  Objective: Vital Signs: BP 124/86   Ht 5\' 9"  (1.753 m)   Wt 245 lb (111.1 kg)   BMI 36.18 kg/m  [facility] 02/03/2021  Frequency of aerobic exercise (# of days/week) 3  Average time in minutes 55  Frequency of strengthening activities (# of days/week) 3     No flowsheet data found.  Physical Exam:  General:  Alert and oriented, in no acute distress. Pulm:  Breathing unlabored. Psy:  Normal mood, congruent affect. Skin: Left shoulder without bruises, rashes, or erythema. Overlying skin intact.  Left shoulder Exam:  Inspection: Symmetric  muscle mass, no atrophy or deformity, no scars. Palpation: Endorses significant tenderness to palpation over the [facility] joint on the left.  There is also some tenderness with palpation of the biceps as well as the superior triceps.  No significant tenderness in the subacromial area.  Range of motion: Full range of motion in forward flexion, abduction and extension, though he does endorse pain with AB duction and flexion beyond 90 degrees.  Apley scratch is significantly limited on the left to approximately the level of L2.  He is able to reach T7 on the right.  Rotator cuff testing:  Full strength and no does endorse pain with empty can (supraspinatus).  External rotation with full strength though also endorses some discomfort. Full strength and no pain with Napoleon and lift off.  AC joint testing: Endorses significant AC tenderness to palpation, positive scarf test. Positive active compression test.  Biceps/labral testing: O'Brien's/speeds with full strength and no pain. Biceps load II: Negative Crank test: Negative  Impingement testing: Endorses pain with Hawkins  Instability: No sulcus sign Negative apprehension testing Load-and-shift without multidirectional instability.  Strength testing:  5 out of 5 strength with shoulder abduction (C5), wrist extension (C6), wrist flexion (C7), grip strength (C8), and finger abduction (T1).  Sensation: Intact to light touch  throughout bilateral upper extremities.   Brisk distal capillary refill.   Imaging: Extremity ultrasound- Left Shoulder:  Biceps tendon visualized in long and short axis, with no significant surrounding fluid.  Fibers appear intact. Subscapularis tendon intact, with no significant surrounding effusion. Supraspinatus has a small area of discrete hypoechotic change in the distal footprint, associated with some cortical irregularity.  Additionally, there is enlargement and fluid within the subacromial bursa. Infraspinatus  display some calcifications within the distal footprint, without hypoechoic changes within the muscle or surrounding fluid. AC joint visualized, with large geyser sign as well as decreased joint space. Glenohumeral joint visualized with moderate bony spurring, without effusion.  No obvious posterior labral pathology.  Impression: Concern for partial supraspinatus tear, with associated increased bursal swelling.  Additionally, large effusion at [facility] joint.  Assessment & Plan: 60 year old male presenting to clinic with subacute left shoulder pain which is atraumatic in nature.  On examination, patient states palpation of his AC joint reproduces the majority of his pain, however he has concern for rotator cuff pathology on ultrasound as well as with empty can testing.  Given strongly positive AC joint testing as well as patient stating this is the most tender spot, injection therapy was discussed with patient.  Risks and benefits reviewed, and patient opted to proceed.  Corticosteroid injection to [facility] joint was performed as described below, which patient tolerated very well.  He endorsed immediate improvement of his symptoms during anesthetic phase. -Aftercare and return precautions were discussed. -Educated on exercises for rotator cuff rehabilitation. -If he does not notice improvement in approximately 4 weeks, he is to return to clinic.  Would consider subacromial injection at that time. -Patient expresses understanding with plan.  He had no further questions or concerns today.     Procedures: Ultrasound-guided left acromioclavicular joint cortisone Injection:  Risks and benefits of procedure discussed, Patient opted to proceed.  Written consent obtained.  Timeout performed.  Skin prepped in a sterile fashion with betadine before further cleansing with alcohol. Ethyl Chloride was used for topical analgesia.  Left AC joint was injected with 1cc 1% Lidocaine without epinephrine combined with 40 mg  methylprednisolone under ultrasound guidance using a 25G, 1.5in needle.  Injectate was seen filling the joint space.    Patient tolerated the injection well with no immediate complications. Aftercare instructions were discussed, and patient was given strict return precautions.   I was the preceptor for this visit and available for immediate consultation Shellia Cleverly, DO

## 2021-02-03 NOTE — Patient Instructions (Signed)
You had an injection today. Things to be aware of after injection are listed below:   . You may experience no significant improvement or even a slight worsening in your symptoms during the first 24 to 48 hours. After that we expect your symptoms to improve gradually over the next 2 weeks for the medicine to have its maximal effect. You should continue to have improvement out to 6 weeks after your injection. . We recommend icing the site of the injection for 20 minutes 1-2 times the day of your injection and as needed for pain over the following several days. . You may shower but no swimming, tub bath or Jacuzzi for 24 hours. . If your bandage falls off this does not need to be replaced. It is appropriate to remove the bandage after 4 hours. . You may resume light activities as tolerated.  It can take several weeks to see improvements following injection. If after 2 weeks you are continuing to have worsening symptoms, please call our office to discuss what the next appropriate actions should be including the potential for a return office visit or other diagnostic testing.  POSSIBLE PROCEDURE SIDE EFFECTS: The side effects of the injection are usually minimal, self-limited, and usually will resolve on their own. Common side effects that can occur over the first several days following injection include:  . Increased numbness or tingling . Worsening pain, stiffness, or slight weakness . Swelling or bruising at the injection site  If you are concerned, please feel free to contact the office with questions.   Please call our office immediately if you experience any of the following symptoms over the next 2 weeks as these can be signs of infection:  . Fever greater than 100.5F . Significant swelling at the injection site . Significant redness or drainage from the injection site  

## 2021-02-07 DIAGNOSIS — R972 Elevated prostate specific antigen [PSA]: Secondary | ICD-10-CM | POA: Diagnosis not present

## 2021-02-07 DIAGNOSIS — N5201 Erectile dysfunction due to arterial insufficiency: Secondary | ICD-10-CM | POA: Diagnosis not present

## 2021-02-16 ENCOUNTER — Telehealth: Payer: Self-pay | Admitting: Cardiovascular Disease

## 2021-02-16 NOTE — Telephone Encounter (Signed)
   [facility] Pre-operative Risk Assessment    Patient Name: Jeffrey Griffin  DOB: 05-Jan-1961  MRN: [id number]   HEARTCARE STAFF: - Please ensure there is not already an duplicate clearance open for this procedure. - Under Visit Info/Reason for Call, type in Other and utilize the format Clearance MM/DD/YY or Clearance TBD. Do not use dashes or single digits. - If request is for dental extraction, please clarify the # of teeth to be extracted. - If the patient is currently at the dentist's office, call Pre-Op APP to address. If the patient is not currently in the dentist office, please route to the Pre-Op pool  Request for surgical clearance:  What type of surgery is being performed? Prostate biopsy  When is this surgery scheduled? 04/26/21  What type of clearance is required (medical clearance vs. Pharmacy clearance to hold med vs. Both)? Both  Are there any medications that need to be held prior to surgery and how long? Eliquis 5 days prior  Practice name and name of physician performing surgery? [facility], Dr. Alexis Frock  What is the office phone number? [phone]   7.   What is the office fax number? [phone]  8.   Anesthesia type (None, local, MAC, general) ? Valium, or nitrous   Selena Zobro 02/16/2021, 4:57 PM  _________________________________________________________________   (provider comments below)

## 2021-02-17 NOTE — Telephone Encounter (Signed)
Patient with diagnosis of afib on Eliquis for anticoagulation.    Procedure: prostate biopsy Date of procedure: 04/26/21  CHA2DS2-VASc Score = 3  This indicates a 3.2% annual risk of stroke. The patient's score is based upon: CHF History: Yes HTN History: Yes Diabetes History: No Stroke History: No Vascular Disease History: Yes (PE)   CrCl >187mL/min Platelet count 222K  Request is to hold Eliquis for 5 days prior to procedure, however hold that long is not needed due to faster clearance of Eliquis. Per office protocol, recommend that patient hold Eliquis for 2-3 days prior to procedure.

## 2021-02-17 NOTE — Telephone Encounter (Signed)
    Jeffrey Griffin DOB:  1961/06/19  MRN:  [id number]   Primary Cardiologist: [name], MD  Chart reviewed as part of pre-operative protocol coverage. Given past medical history and time since last visit, based on ACC/AHA guidelines, Jeffrey Griffin would be at acceptable risk for the planned procedure without further cardiovascular testing.   Patient with diagnosis of afib on Eliquis for anticoagulation.     Procedure: prostate biopsy Date of procedure: 04/26/21   CHA2DS2-VASc Score = 3  This indicates a 3.2% annual risk of stroke. The patient's score is based upon: CHF History: Yes HTN History: Yes Diabetes History: No Stroke History: No Vascular Disease History: Yes (PE)   CrCl >148mL/min Platelet count 222K   Request is to hold Eliquis for 5 days prior to procedure, however hold that long is not needed due to faster clearance of Eliquis. Per office protocol, recommend that patient hold Eliquis for 2-3 days prior to procedure.    The patient was advised that if he develops new symptoms prior to surgery to contact our office to arrange for a follow-up visit, and he verbalized understanding.  I will route this recommendation to the requesting party via Epic fax function and remove from pre-op pool.  Please call with questions.  Kathyrn Drown, NP 02/17/2021, 1:01 PM

## 2021-03-03 DIAGNOSIS — G4733 Obstructive sleep apnea (adult) (pediatric): Secondary | ICD-10-CM | POA: Diagnosis not present

## 2021-03-04 ENCOUNTER — Other Ambulatory Visit: Payer: Self-pay

## 2021-03-04 ENCOUNTER — Ambulatory Visit: Payer: BC Managed Care – PPO | Admitting: Family Medicine

## 2021-03-04 ENCOUNTER — Encounter: Payer: Self-pay | Admitting: Family Medicine

## 2021-03-04 VITALS — BP 138/75 | HR 74 | Ht 69.0 in | Wt 249.6 lb

## 2021-03-04 DIAGNOSIS — R5383 Other fatigue: Secondary | ICD-10-CM

## 2021-03-04 DIAGNOSIS — R2242 Localized swelling, mass and lump, left lower limb: Secondary | ICD-10-CM | POA: Insufficient documentation

## 2021-03-04 DIAGNOSIS — B079 Viral wart, unspecified: Secondary | ICD-10-CM

## 2021-03-04 NOTE — Assessment & Plan Note (Signed)
Patient with recurrent wart on right fourth finger.  Previously attempted freezing without much response and clips it at home. -Referral to dermatology clinic for shave versus freeze

## 2021-03-04 NOTE — Assessment & Plan Note (Signed)
Firm 1cm x 1cm nodule on plantar surface distal to the hallux in the midfoot area that does not move with flexion or extension of the toes.  Reassured that there has not been any signs of inflammation, has been stable and slow to grow over the last several years and is without pain and not affecting gait.  Consideration for an neurofibroma but it is an odd area for 1.  Nodule possibly located in the fascia and is bothersome to patient. -Referral to podiatry for further evaluation

## 2021-03-04 NOTE — Patient Instructions (Signed)
Fatigue, we will check some labs to see if you have any development of anemia or any thyroid disorders that could explain some of your fatigue.  Continue to use your CPAP as I think it is very helpful.  Good sleep hygiene will also be important if you do not feel rested at the end of your sleeps.  For the lump on your foot I am sending in a referral to podiatry.  For the wart on your right finger I want you to schedule an appointment with the front desk to get into dermatology clinic to it removed either by freezing or another means.

## 2021-03-04 NOTE — Assessment & Plan Note (Signed)
Fatigue for several months with weight gain and cold/heat intolerance.  Patient has OSA and is on CPAP with improvement in symptoms but still fatigued.  Thyroid does not appear enlarged on exam.  Consideration for hypothyroidism or possible anemia.  Anemia seems less likely given recent CBC 2 months ago was normal but we will recheck and we will obtain a ferritin level to ensure no iron deficiency component contributing to the picture. -TSH, ferritin, CBC

## 2021-03-04 NOTE — Progress Notes (Signed)
    SUBJECTIVE:   CHIEF COMPLAINT / HPI:   Waking up sleepy  Fatigue Tired no matter how much he sleeps, seems to be worse even on the weekends when he is able to supine.  Patient also reports weight gain (last year 237lb now 246lbs)--weight fluctuations are common since high school. Uses CPAP at night consistently but has not had improvement in fatigue still. Has been using CPAP since December, the fatigue improved but is still not back to what he feels his baseline is. More cold and heat intolerance, no issues with bowel changes, no mental fatigue. Daughter had thyroid cancer that required partial removal of thyroid.  Lump on left foot Has been present for 7-8 years, feels like it has grown slightly. Bothers him, foot gets more sore towards the ned of the work-week. Does not affect gait/walking, does not limit use of the foot. Came on gradually, has never had inflammation in the area.  Wart on right finger Clips it off (last done 3 weeks ago), present for 15 years. Tried freezing once but didn't have much response.    PERTINENT  PMH / PSH: Reviewed  OBJECTIVE:   BP 138/75   Pulse 74   Ht 5\' 9"  (1.753 m)   Wt 249 lb 9.6 oz (113.2 kg)   SpO2 96%   BMI 36.86 kg/m   General: NAD, well-appearing, well-nourished Neck: Supple, full ROM, no obvious thyromegaly on exam Respiratory: No respiratory distress, breathing comfortably, able to speak in full sentences Skin: warm and dry, no rashes noted on exposed skin, wart on lateral edge of right 4th finger, 1cmx1cm firm nodule on plantar midfoot that does not move with toe flexion/extension and is nonpainful to palpation. Psych: Appropriate affect and mood   ASSESSMENT/PLAN:   Fatigue Fatigue for several months with weight gain and cold/heat intolerance.  Patient has OSA and is on CPAP with improvement in symptoms but still fatigued.  Thyroid does not appear enlarged on exam.  Consideration for hypothyroidism or possible anemia.  Anemia  seems less likely given recent CBC 2 months ago was normal but we will recheck and we will obtain a ferritin level to ensure no iron deficiency component contributing to the picture. -TSH, ferritin, CBC  Viral wart on finger Patient with recurrent wart on right fourth finger.  Previously attempted freezing without much response and clips it at home. -Referral to dermatology clinic for shave versus freeze  Subcutaneous nodule of left foot Firm 1cm x 1cm nodule on plantar surface distal to the hallux in the midfoot area that does not move with flexion or extension of the toes.  Reassured that there has not been any signs of inflammation, has been stable and slow to grow over the last several years and is without pain and not affecting gait.  Consideration for an neurofibroma but it is an odd area for 1.  Nodule possibly located in the fascia and is bothersome to patient. -Referral to podiatry for further evaluation     Jeffrey Griffin, [place]

## 2021-03-05 LAB — CBC
Hematocrit: 45.3 % (ref 37.5–51.0)
Hemoglobin: 15.6 g/dL (ref 13.0–17.7)
MCH: 30.3 pg (ref 26.6–33.0)
MCHC: 34.4 g/dL (ref 31.5–35.7)
MCV: 88 fL (ref 79–97)
Platelets: 216 10*3/uL (ref 150–450)
RBC: 5.15 x10E6/uL (ref 4.14–5.80)
RDW: 11.8 % (ref 11.6–15.4)
WBC: 5.8 10*3/uL (ref 3.4–10.8)

## 2021-03-05 LAB — TSH: TSH: 0.776 u[IU]/mL (ref 0.450–4.500)

## 2021-03-05 LAB — FERRITIN: Ferritin: 283 ng/mL (ref 30–400)

## 2021-03-08 ENCOUNTER — Other Ambulatory Visit (HOSPITAL_COMMUNITY): Payer: Self-pay | Admitting: *Deleted

## 2021-03-08 DIAGNOSIS — R0681 Apnea, not elsewhere classified: Secondary | ICD-10-CM

## 2021-03-08 DIAGNOSIS — R0683 Snoring: Secondary | ICD-10-CM

## 2021-03-15 ENCOUNTER — Other Ambulatory Visit (HOSPITAL_COMMUNITY): Payer: Self-pay | Admitting: Physician Assistant

## 2021-03-15 ENCOUNTER — Other Ambulatory Visit (HOSPITAL_COMMUNITY): Payer: Self-pay | Admitting: *Deleted

## 2021-03-15 NOTE — Addendum Note (Signed)
Encounter addended by: Oliver Barre, PA on: 03/15/2021 12:19 PM  Actions taken: Order list changed

## 2021-03-25 ENCOUNTER — Ambulatory Visit: Payer: BC Managed Care – PPO | Admitting: Podiatry

## 2021-03-28 ENCOUNTER — Other Ambulatory Visit: Payer: Self-pay

## 2021-03-28 ENCOUNTER — Emergency Department (HOSPITAL_COMMUNITY)
Admission: EM | Admit: 2021-03-28 | Discharge: 2021-03-29 | Disposition: A | Discharge status: Home / Self Care | Payer: BC Managed Care – PPO | Attending: Emergency Medicine | Admitting: Emergency Medicine

## 2021-03-28 ENCOUNTER — Encounter (HOSPITAL_COMMUNITY): Payer: Self-pay | Admitting: *Deleted

## 2021-03-28 ENCOUNTER — Ambulatory Visit (HOSPITAL_COMMUNITY)
Admission: EM | Admit: 2021-03-28 | Discharge: 2021-03-28 | Disposition: A | Discharge status: Home / Self Care | Payer: BC Managed Care – PPO | Attending: Internal Medicine | Admitting: Internal Medicine

## 2021-03-28 ENCOUNTER — Encounter (HOSPITAL_COMMUNITY): Payer: Self-pay | Admitting: Emergency Medicine

## 2021-03-28 DIAGNOSIS — R110 Nausea: Secondary | ICD-10-CM | POA: Diagnosis not present

## 2021-03-28 DIAGNOSIS — I5042 Chronic combined systolic (congestive) and diastolic (congestive) heart failure: Secondary | ICD-10-CM | POA: Insufficient documentation

## 2021-03-28 DIAGNOSIS — Z79899 Other long term (current) drug therapy: Secondary | ICD-10-CM | POA: Diagnosis not present

## 2021-03-28 DIAGNOSIS — I110 Hypertensive heart disease with heart failure: Secondary | ICD-10-CM | POA: Insufficient documentation

## 2021-03-28 DIAGNOSIS — R42 Dizziness and giddiness: Secondary | ICD-10-CM

## 2021-03-28 DIAGNOSIS — Z7901 Long term (current) use of anticoagulants: Secondary | ICD-10-CM | POA: Insufficient documentation

## 2021-03-28 DIAGNOSIS — I4891 Unspecified atrial fibrillation: Secondary | ICD-10-CM | POA: Diagnosis not present

## 2021-03-28 LAB — CBC WITH DIFFERENTIAL/PLATELET
Abs Immature Granulocytes: 0.03 10*3/uL (ref 0.00–0.07)
Basophils Absolute: 0 10*3/uL (ref 0.0–0.1)
Basophils Relative: 0 %
Eosinophils Absolute: 0.1 10*3/uL (ref 0.0–0.5)
Eosinophils Relative: 1 %
HCT: 48.6 % (ref 39.0–52.0)
Hemoglobin: 15.7 g/dL (ref 13.0–17.0)
Immature Granulocytes: 0 %
Lymphocytes Relative: 18 %
Lymphs Abs: 1.7 10*3/uL (ref 0.7–4.0)
MCH: 30.2 pg (ref 26.0–34.0)
MCHC: 32.3 g/dL (ref 30.0–36.0)
MCV: 93.5 fL (ref 80.0–100.0)
Monocytes Absolute: 0.8 10*3/uL (ref 0.1–1.0)
Monocytes Relative: 9 %
Neutro Abs: 6.3 10*3/uL (ref 1.7–7.7)
Neutrophils Relative %: 72 %
Platelets: 239 10*3/uL (ref 150–400)
RBC: 5.2 MIL/uL (ref 4.22–5.81)
RDW: 12.1 % (ref 11.5–15.5)
WBC: 9 10*3/uL (ref 4.0–10.5)
nRBC: 0 % (ref 0.0–0.2)

## 2021-03-28 LAB — COMPREHENSIVE METABOLIC PANEL
ALT: 29 U/L (ref 0–44)
AST: 28 U/L (ref 15–41)
Albumin: 4 g/dL (ref 3.5–5.0)
Alkaline Phosphatase: 75 U/L (ref 38–126)
Anion gap: 7 (ref 5–15)
BUN: 8 mg/dL (ref 6–20)
CO2: 27 mmol/L (ref 22–32)
Calcium: 10.2 mg/dL (ref 8.9–10.3)
Chloride: 104 mmol/L (ref 98–111)
Creatinine, Ser: 0.9 mg/dL (ref 0.61–1.24)
GFR, Estimated: >60 mL/min (ref >60)
Glucose, Bld: 99 mg/dL (ref 70–99)
Potassium: 4.1 mmol/L (ref 3.5–5.1)
Sodium: 138 mmol/L (ref 135–145)
Total Bilirubin: 0.9 mg/dL (ref 0.3–1.2)
Total Protein: 7.1 g/dL (ref 6.5–8.1)

## 2021-03-28 NOTE — Discharge Instructions (Addendum)
Please go to hospital as soon as you leave urgent care for further evaluation and management.

## 2021-03-28 NOTE — ED Triage Notes (Signed)
Pt reports room spinning when he moves. Started at 0300 today.

## 2021-03-28 NOTE — ED Provider Notes (Signed)
[place]    CSN: [id number] Arrival date & time: 03/28/21  1503      History   Chief Complaint No chief complaint on file.   HPI Jeffrey Griffin is a 60 y.o. male.   Patient presents to the urgent care with dizziness and nausea that started at approximately 0300 today.  Denies any chest pain, shortness of breath, upper respiratory symptoms, fever, weakness.  Patient does have extensive cardiac history with known AAA, atrial fibrillation, CHF and cardiomyopathy.    Past Medical History:  Diagnosis Date   Abnormality of thoracic aorta    4.3 CM ECTATIC ASENDING    Cardiopathy    CHF (congestive heart failure) (HCC)    SYSTOLIC   Hyperlipidemia    Hypertension    Lung nodule    Obesity    Persistent atrial fibrillation (HCC)    WITH RVR   Pleural effusion    RIGHT UPPER LOBE AND RIGHT LOWER LOBE     Patient Active Problem List   Diagnosis Date Noted   Fatigue 03/04/2021   Subcutaneous nodule of left foot 03/04/2021   Viral wart on finger 03/04/2021   Acute pain of left shoulder 01/28/2021   Atypical atrial flutter ([place]) 05/26/2020   Secondary hypercoagulable state ([place]) 04/13/2020   Chronic combined systolic and diastolic CHF (congestive heart failure) ([place]) 04/06/2020   Acute on chronic systolic CHF (congestive heart failure) ([place]) 10/01/2019   Persistent atrial fibrillation ([place]) 10/01/2019   Pulmonary embolus ([place]) 10/01/2019    Past Surgical History:  Procedure Laterality Date   APPENDECTOMY     ATRIAL FIBRILLATION ABLATION N/A 07/16/2020   Procedure: ATRIAL FIBRILLATION ABLATION;  Surgeon: [name], MD;  Location: [facility];  Service: Cardiovascular;  Laterality: N/A;   CARDIOVERSION N/A 10/20/2019   Procedure: CARDIOVERSION;  Surgeon: Acie Fredrickson Wonda Cheng, MD;  Location: [facility] ENDOSCOPY;  Service: Cardiovascular;  Laterality: N/A;   CARDIOVERSION N/A 05/31/2020   Procedure: CARDIOVERSION;  Surgeon: Josue Hector, MD;  Location: [facility]  ENDOSCOPY;  Service: Cardiovascular;  Laterality: N/A;   VASECTOMY         Home Medications    Prior to Admission medications   Medication Sig Start Date End Date Taking? Authorizing Provider  ascorbic acid (VITAMIN C) 500 MG tablet Take 500 mg by mouth daily.    [provider]  atorvastatin (LIPITOR) 40 MG tablet Take 1 tablet (40 mg total) by mouth daily. 12/14/20   Nahser, Wonda Cheng, MD  Carnitine-B5-B6 500-15-5 MG TABS Take by mouth.    [provider]  Cholecalciferol (VITAMIN D3) 25 MCG (1000 UT) CAPS Take 1,000 Units by mouth daily.     [provider]  Citrulline POWD Take by mouth.    [provider]  CREATINE MONOHYDRATE PO Take by mouth.    [provider]  Cyanocobalamin (B-12) 5000 MCG CAPS Take by mouth.    [provider]  D-Ribose POWD by Does not apply route.    [provider]  diclofenac Sodium (VOLTAREN) 1 % GEL Apply 4 g topically 4 (four) times daily. 01/28/21   Lilland, Alana, DO  ELIQUIS 5 MG TABS tablet TAKE 1 TABLET BY MOUTH TWICE A DAY 09/10/20   Allred, Jeneen Rinks, MD  ENTRESTO 49-51 MG TAKE 1 TABLET BY MOUTH 2 (TWO) TIMES DAILY. 10/27/20   Nahser, Wonda Cheng, MD  Garlic 123XX123 MG CAPS Take by mouth.    [provider]  Glucosamine-Chondroitin (COSAMIN DS PO) Take 2 tablets  by mouth daily.    [provider]  metoprolol succinate (TOPROL-XL) 50 MG 24 hr tablet Take 1 tablet (50 mg total) by mouth daily. Take with or immediately following a meal. 10/18/20   Allred, Jeneen Rinks, MD  Multiple Minerals-Vitamins (CAL MAG ZINC +D3 PO) Take 1 tablet by mouth daily.     [provider]  Multiple Vitamin (MULTIVITAMIN WITH MINERALS) TABS tablet Take 1 tablet by mouth daily.    [provider]  Omega-3 1400 MG CAPS Take by mouth.    [provider]  Taurine 1000 MG CAPS Take by mouth.    [provider]  vitamin E 180 MG (400 UNITS) capsule Take 400 Units by mouth daily.     [provider]  Zn-Pyg Afri-Nettle-Saw Palmet (SAW PALMETTO COMPLEX PO) Take by mouth.    [provider]    Family History Family History  Problem Relation Age of Onset   Hypertrophic cardiomyopathy Mother    Stroke Father    Hypertension Father    Hypertension Sister    Atrial fibrillation Brother     Social History Social History   Tobacco Use   Smoking status: Never   Smokeless tobacco: Never  Substance Use Topics   Alcohol use: Not Currently   Drug use: No     Allergies   Patient has no known allergies.   Review of Systems Review of Systems Per HPI  Physical Exam Triage Vital Signs ED Triage Vitals [03/28/21 1708]  Enc Vitals Group     BP 140/77     Pulse Rate 63     Resp 20     Temp 98.3 F (36.8 C)     Temp src      SpO2 99 %     Weight      Height      Head Circumference      [facility] Flow      Pain Score      Pain Loc      Pain Edu?      Excl. in [place]?    No data found.  Updated Vital Signs BP 140/77   Pulse 63   Temp 98.3 F (36.8 C)   Resp 20   SpO2 99%   Visual Acuity Right Eye Distance:   Left Eye Distance:   Bilateral Distance:    Right Eye Near:   Left Eye Near:    Bilateral Near:     Physical Exam Constitutional:      General: He is not in acute distress.    Appearance: Normal appearance. He is not ill-appearing, toxic-appearing or diaphoretic.  HENT:     Head: Normocephalic and atraumatic.  Eyes:     Extraocular Movements: Extraocular movements intact.     Conjunctiva/sclera: Conjunctivae normal.  Cardiovascular:     Rate and Rhythm: Normal rate and regular rhythm.     Pulses: Normal pulses.     Heart sounds: Normal heart sounds.  Pulmonary:     Effort: Pulmonary effort is normal. No respiratory distress.     Breath sounds: Normal breath sounds. No wheezing, rhonchi or rales.  Abdominal:     General: Abdomen is flat. Bowel sounds are normal. There is no distension.     Palpations: Abdomen is soft.      Tenderness: There is no abdominal tenderness.  Skin:    General: Skin is warm and dry.  Neurological:     General: No focal deficit present.  Mental Status: He is alert and oriented to person, place, and time. Mental status is at baseline.  Psychiatric:        Mood and Affect: Mood normal.        Behavior: Behavior normal.        Thought Content: Thought content normal.        Judgment: Judgment normal.     UC Treatments / Results  Labs (all labs ordered are listed, but only abnormal results are displayed) Labs Reviewed - No data to display  EKG   Radiology No results found.  Procedures Procedures (including critical care time)  Medications Ordered in UC Medications - No data to display  Initial Impression / Assessment and Plan / UC Course  I have reviewed the triage vital signs and the nursing notes.  Pertinent labs & imaging results that were available during my care of the patient were reviewed by me and considered in my medical decision making (see chart for details).     EKG was normal sinus rhythm at urgent care today.  Unable to rule out worrisome clinical diagnoses given patient's extensive cardiac history.  Patient was advised to go to the hospital for further evaluation and management.  Patient was agreeable with plan and states that he would leave the urgent care and go to the hospital.  Vital signs stable at discharge. Final Clinical Impressions(s) / UC Diagnoses   Final diagnoses:  Nausea without vomiting  Dizziness and giddiness     Discharge Instructions      Please go to hospital as soon as you leave urgent care for further evaluation and management.     ED Prescriptions   None    PDMP not reviewed this encounter.   Odis Luster, [place] 03/28/21 1731

## 2021-03-28 NOTE — ED Provider Notes (Signed)
Emergency Medicine Provider Triage Evaluation Note  Nykel Wibbenmeyer , a 60 y.o. male  was evaluated in triage.  Pt complains of dizziness.  Patient states that he started having vertiginous room spinning sensation around 3 AM, started to feel nauseous, did not pass out or vomit.  Patient states that he was sleeping at this time, his head was tilted to the right, lasted a couple seconds then he went back to normal.  Has never had anything happen to him like this before.  Denies any head injury.   Patient states that throughout the day whenever he looks to the right and backward the symptoms began again and last a couple seconds and will resolve once he puts his head in a normal position.  Denies any headache, vision changes, tinnitus, hearing loss.  Denies chest pain shortness of breath, vomiting.  Feels fine currently.  Review of Systems  Positive: Vertigo, nausea Negative: Headache  Physical Exam  BP (!) 160/98 (BP Location: Left Arm)   Pulse 64   Temp 98.2 F (36.8 C)   Resp 20   SpO2 99%  Gen:   Awake, no distress   Resp:  Normal effort  MSK:   Moves extremities without difficulty  Other:  Reproducible vertigo when patient places his head to the right.  Horizontal nystagmus noted when patient places his head to the right.  Cranial nerves II through XII grossly intact.  Good strength to bilateral upper and lower extremities.  Good sensation throughout.  Normal finger-nose.  Negative pronator drift.  Normal gait.  Medical Decision Making  Medically screening exam initiated at 7:57 PM.  Appropriate orders placed.  Jacquees Wagy was informed that the remainder of the evaluation will be completed by another provider, this initial triage assessment does not replace that evaluation, and the importance of remaining in the ED until their evaluation is complete.     Alfredia Client, PA-C 03/28/21 2007    Lennice Sites, DO 03/28/21 2329

## 2021-03-28 NOTE — ED Notes (Signed)
Pt condition reported to Pa.

## 2021-03-28 NOTE — ED Triage Notes (Signed)
Patient reports dizziness/vertigo onset 0300 today , seen at urgent care sent here for further evaluation .

## 2021-03-29 ENCOUNTER — Emergency Department (HOSPITAL_COMMUNITY): Payer: BC Managed Care – PPO

## 2021-03-29 DIAGNOSIS — R42 Dizziness and giddiness: Secondary | ICD-10-CM | POA: Diagnosis not present

## 2021-03-29 MED ORDER — MECLIZINE HCL 25 MG PO TABS
25.0000 mg | ORAL_TABLET | Freq: Once | ORAL | Status: AC
  Administered 2021-03-29: 25 mg via ORAL
  Filled 2021-03-29: qty 1

## 2021-03-29 MED ORDER — MECLIZINE HCL 25 MG PO TABS: 25.0000 mg | ORAL_TABLET | Freq: Three times a day (TID) | ORAL | 0 refills | Status: DC | PRN

## 2021-03-29 NOTE — Discharge Instructions (Addendum)
The MRI did not show any signs of a stroke or other pathology.  Your symptoms are likely due to vertigo.  Several weeks before improving.  Take the medications as written, follow-up with your doctor within the week.  Return back to the ER if you feel worse or have new symptoms or any additional concerns.

## 2021-03-29 NOTE — ED Provider Notes (Signed)
[facility] EMERGENCY DEPARTMENT Provider Note   CSN: [id number] Arrival date & time: 03/28/21  1737     History Chief Complaint  Patient presents with   Dizziness    Jeffrey Griffin is a 60 y.o. male.  Patient presents with complaint of dizziness episode that started around 3 AM.  He states that when he turns his head to the left he feels the room spinning dizziness.  When he gets up slowly he states symptoms are much improved. Denies any headache or chest pain.  No fevers cough vomiting or diarrhea.  No prior symptoms of vertigo in the past.      Past Medical History:  Diagnosis Date   Abnormality of thoracic aorta    4.3 CM ECTATIC ASENDING    Cardiopathy    CHF (congestive heart failure) (HCC)    SYSTOLIC   Hyperlipidemia    Hypertension    Lung nodule    Obesity    Persistent atrial fibrillation (HCC)    WITH RVR   Pleural effusion    RIGHT UPPER LOBE AND RIGHT LOWER LOBE     Patient Active Problem List   Diagnosis Date Noted   Fatigue 03/04/2021   Subcutaneous nodule of left foot 03/04/2021   Viral wart on finger 03/04/2021   Acute pain of left shoulder 01/28/2021   Atypical atrial flutter ([place]) 05/26/2020   Secondary hypercoagulable state ([place]) 04/13/2020   Chronic combined systolic and diastolic CHF (congestive heart failure) ([place]) 04/06/2020   Acute on chronic systolic CHF (congestive heart failure) ([place]) 10/01/2019   Persistent atrial fibrillation ([place]) 10/01/2019   Pulmonary embolus ([place]) 10/01/2019    Past Surgical History:  Procedure Laterality Date   APPENDECTOMY     ATRIAL FIBRILLATION ABLATION N/A 07/16/2020   Procedure: ATRIAL FIBRILLATION ABLATION;  Surgeon: [name], MD;  Location: [place] CV LAB;  Service: Cardiovascular;  Laterality: N/A;   CARDIOVERSION N/A 10/20/2019   Procedure: CARDIOVERSION;  Surgeon: Acie Fredrickson Wonda Cheng, MD;  Location: [facility] ENDOSCOPY;  Service: Cardiovascular;  Laterality: N/A;   CARDIOVERSION  N/A 05/31/2020   Procedure: CARDIOVERSION;  Surgeon: Josue Hector, MD;  Location: [facility] ENDOSCOPY;  Service: Cardiovascular;  Laterality: N/A;   VASECTOMY         Family History  Problem Relation Age of Onset   Hypertrophic cardiomyopathy Mother    Stroke Father    Hypertension Father    Hypertension Sister    Atrial fibrillation Brother     Social History   Tobacco Use   Smoking status: Never   Smokeless tobacco: Never  Substance Use Topics   Alcohol use: Not Currently   Drug use: No    Home Medications Prior to Admission medications   Medication Sig Start Date End Date Taking? Authorizing Provider  meclizine (ANTIVERT) 25 MG tablet Take 1 tablet (25 mg total) by mouth 3 (three) times daily as needed for dizziness. 03/29/21  Yes Luna Fuse, MD  ascorbic acid (VITAMIN C) 500 MG tablet Take 500 mg by mouth daily.    [provider]  atorvastatin (LIPITOR) 40 MG tablet Take 1 tablet (40 mg total) by mouth daily. 12/14/20   Nahser, Wonda Cheng, MD  Carnitine-B5-B6 500-15-5 MG TABS Take by mouth.    [provider]  Cholecalciferol (VITAMIN D3) 25 MCG (1000 UT) CAPS Take 1,000 Units by mouth daily.     [provider]  Citrulline POWD Take by mouth.    [provider]  CREATINE MONOHYDRATE  PO Take by mouth.    [provider]  Cyanocobalamin (B-12) 5000 MCG CAPS Take by mouth.    [provider]  D-Ribose POWD by Does not apply route.    [provider]  diclofenac Sodium (VOLTAREN) 1 % GEL Apply 4 g topically 4 (four) times daily. 01/28/21   Lilland, Alana, DO  ELIQUIS 5 MG TABS tablet TAKE 1 TABLET BY MOUTH TWICE A DAY 09/10/20   Allred, Jeneen Rinks, MD  ENTRESTO 49-51 MG TAKE 1 TABLET BY MOUTH 2 (TWO) TIMES DAILY. 10/27/20   Nahser, Wonda Cheng, MD  Garlic 123XX123 MG CAPS Take by mouth.    [provider]  Glucosamine-Chondroitin (COSAMIN DS PO) Take 2 tablets by mouth daily.    [provider]  metoprolol  succinate (TOPROL-XL) 50 MG 24 hr tablet Take 1 tablet (50 mg total) by mouth daily. Take with or immediately following a meal. 10/18/20   Allred, Jeneen Rinks, MD  Multiple Minerals-Vitamins (CAL MAG ZINC +D3 PO) Take 1 tablet by mouth daily.     [provider]  Multiple Vitamin (MULTIVITAMIN WITH MINERALS) TABS tablet Take 1 tablet by mouth daily.    [provider]  Omega-3 1400 MG CAPS Take by mouth.    [provider]  Taurine 1000 MG CAPS Take by mouth.    [provider]  vitamin E 180 MG (400 UNITS) capsule Take 400 Units by mouth daily.    [provider]  Zn-Pyg Afri-Nettle-Saw Palmet (SAW PALMETTO COMPLEX PO) Take by mouth.    [provider]    Allergies    Patient has no known allergies.  Review of Systems   Review of Systems  Constitutional:  Negative for fever.  HENT:  Negative for ear pain and sore throat.   Eyes:  Negative for pain.  Respiratory:  Negative for cough.   Cardiovascular:  Negative for chest pain.  Gastrointestinal:  Negative for abdominal pain.  Genitourinary:  Negative for flank pain.  Musculoskeletal:  Negative for back pain.  Skin:  Negative for color change and rash.  Neurological:  Negative for syncope.  All other systems reviewed and are negative.  Physical Exam Updated Vital Signs BP (!) 142/91   Pulse (!) 57   Temp 98.6 F (37 C) (Oral)   Resp 18   Ht '5\' 9"'$  (1.753 m)   Wt 120 kg   SpO2 99%   BMI 39.07 kg/m   Physical Exam Constitutional:      Appearance: He is well-developed.  HENT:     Head: Normocephalic.     Nose: Nose normal.  Eyes:     Extraocular Movements: Extraocular movements intact.  Cardiovascular:     Rate and Rhythm: Normal rate.  Pulmonary:     Effort: Pulmonary effort is normal.  Skin:    Coloration: Skin is not jaundiced.  Neurological:     General: No focal deficit present.     Mental Status: He is alert and oriented to person, place, and time. Mental status is  at baseline.     Cranial Nerves: No cranial nerve deficit.     Motor: No weakness.     Comments: Patient has a moderately unstable gait here in the ER.  Otherwise finger-nose and heel-to-shin are intact.    ED Results / Procedures / Treatments   Labs (all labs ordered are listed, but only abnormal results are displayed) Labs Reviewed  CBC WITH DIFFERENTIAL/PLATELET  COMPREHENSIVE METABOLIC PANEL    EKG EKG  Interpretation  Date/Time:  Monday March 28 2021 19:52:56 EDT Ventricular Rate:  66 PR Interval:  172 QRS Duration: 110 QT Interval:  390 QTC Calculation: 408 R Axis:   32 Text Interpretation: Normal sinus rhythm Cannot rule out Anterior infarct , age undetermined Abnormal ECG Confirmed by Ripley Fraise [phone]) on 03/29/2021 9:13:21 AM  Radiology MR BRAIN WO CONTRAST  Result Date: 03/29/2021 CLINICAL DATA:  Dizziness EXAM: MRI HEAD WITHOUT CONTRAST TECHNIQUE: Multiplanar, multiecho pulse sequences of the brain and surrounding structures were obtained without intravenous contrast. COMPARISON:  None. FINDINGS: Brain: There is no acute infarction or intracranial hemorrhage. There is no intracranial mass, mass effect, or edema. There is no hydrocephalus or extra-axial fluid collection. Ventricles and sulci are normal in size and configuration. Vascular: Major vessel flow voids at the skull base are preserved. Skull and upper cervical spine: Normal marrow signal is preserved. Sinuses/Orbits: Mild mucosal thickening.  Orbits are unremarkable. Other: Sella is unremarkable.  Mastoid air cells are clear. IMPRESSION: No evidence of recent infarction, hemorrhage, or mass. Electronically Signed   By: Macy Mis M.D.   On: 03/29/2021 15:01    Procedures Procedures   Medications Ordered in ED Medications  meclizine (ANTIVERT) tablet 25 mg (25 mg Oral Given 03/29/21 1107)    ED Course  I have reviewed the triage vital signs and the nursing notes.  Pertinent labs & imaging results  that were available during my care of the patient were reviewed by me and considered in my medical decision making (see chart for details).    MDM Rules/Calculators/A&P                           Labs are unremarkable.  MRI imaging of the brain shows no acute infarct or other pathology.  Patient given meclizine with some improvement.  Will recommend outpatient follow-up with his primary care doctor within the week. Recommending immediate return for worsening symptoms new numbness weakness or any additional concerns.  Final Clinical Impression(s) / ED Diagnoses Final diagnoses:  Dizziness  Vertigo    Rx / DC Orders ED Discharge Orders          Ordered    meclizine (ANTIVERT) 25 MG tablet  3 times daily PRN        03/29/21 1511             Luna Fuse, MD 03/29/21 1511

## 2021-03-29 NOTE — ED Notes (Signed)
Patient Alert and oriented to baseline. Stable and ambulatory to baseline. Patient verbalized understanding of the discharge instructions.  Patient belongings were taken by the patient.   

## 2021-03-30 ENCOUNTER — Ambulatory Visit: Payer: BC Managed Care – PPO | Admitting: Family Medicine

## 2021-04-01 ENCOUNTER — Ambulatory Visit: Payer: BC Managed Care – PPO | Admitting: Podiatry

## 2021-04-02 DIAGNOSIS — G4733 Obstructive sleep apnea (adult) (pediatric): Secondary | ICD-10-CM | POA: Diagnosis not present

## 2021-04-05 ENCOUNTER — Ambulatory Visit: Payer: BC Managed Care – PPO | Admitting: Family Medicine

## 2021-04-05 ENCOUNTER — Other Ambulatory Visit: Payer: Self-pay

## 2021-04-05 VITALS — BP 140/70 | HR 69 | Ht 69.0 in

## 2021-04-05 DIAGNOSIS — H8113 Benign paroxysmal vertigo, bilateral: Secondary | ICD-10-CM | POA: Diagnosis not present

## 2021-04-05 DIAGNOSIS — I4819 Other persistent atrial fibrillation: Secondary | ICD-10-CM | POA: Diagnosis not present

## 2021-04-05 DIAGNOSIS — R42 Dizziness and giddiness: Secondary | ICD-10-CM | POA: Diagnosis not present

## 2021-04-05 DIAGNOSIS — H8110 Benign paroxysmal vertigo, unspecified ear: Secondary | ICD-10-CM | POA: Insufficient documentation

## 2021-04-05 MED ORDER — MECLIZINE HCL 25 MG PO TABS: 25.0000 mg | ORAL_TABLET | Freq: Three times a day (TID) | ORAL | 1 refills | Status: DC | PRN

## 2021-04-05 NOTE — Progress Notes (Signed)
    SUBJECTIVE:   CHIEF COMPLAINT / HPI:   Dizziness, vertigo - went to ED 03/28/21 - woke up 0300 last Mon with dizziness - falling against wall while walking - ok if sitting still - worse when moving head, walking - no problems with stumbling - did home SpO2, handheld EKG all fine at home - was given meclizine with some improvement - daughter is ICU RN, already talked to him about BPPV etiology - hx a fib s/p ablation - now normally in NSR  PERTINENT  PMH / PSH: HFrEF, A/fib s/p ablation, hx PE, fatigue  OBJECTIVE:   BP 140/70   Pulse 69   Ht '5\' 9"'$  (1.753 m)   SpO2 97%   BMI 39.07 kg/m    PHQ-9:  Depression screen [facility] 2/9 01/28/2021 11/11/2015 10/19/2015  Decreased Interest 0 0 0  Down, Depressed, Hopeless 0 0 0  PHQ - 2 Score 0 0 0  Altered sleeping 0 - -  Tired, decreased energy 1 - -  Change in appetite 1 - -  Feeling bad or failure about yourself  0 - -  Trouble concentrating 0 - -  Moving slowly or fidgety/restless 0 - -  Suicidal thoughts 0 - -  PHQ-9 Score 2 - -     GAD-7: No flowsheet data found.   Physical Exam General: Awake, alert, oriented HEENT: sclera anicteric, EOM intact, no nystagmus on exam Cardiovascular: Regular rate and rhythm, S1 and S2 present, no murmurs auscultated Respiratory: Lung fields clear to auscultation bilaterally Neuro: Cranial nerves II through X intact, able to move all extremities spontaneously  ASSESSMENT/PLAN:   BPPV (benign paroxysmal positional vertigo) Bilateral, appears worse on right. No nystagmus on exam today. At ED, normal EKG, BP, MR brain wo contrast. Meclizine provides moderate relief. Low suspicion for vestibular infarct or tumor given normal MRI and normal gait. Low suspicion for stroke given normal MRI, normal neuro testing, no focal neuro deficits. BPPV most likely given s/s.  - refill meclizine - provided handout on Epley maneuvers to be done at home on floor or in bed - referral to neuro-vestibular  rehab - return precautions discussed      Ezequiel Essex, MD [place]

## 2021-04-05 NOTE — Assessment & Plan Note (Signed)
Bilateral, appears worse on right. No nystagmus on exam today. At ED, normal EKG, BP, MR brain wo contrast. Meclizine provides moderate relief. Low suspicion for vestibular infarct or tumor given normal MRI and normal gait. Low suspicion for stroke given normal MRI, normal neuro testing, no focal neuro deficits. BPPV most likely given s/s.  - refill meclizine - provided handout on Epley maneuvers to be done at home on floor or in bed - referral to neuro-vestibular rehab - return precautions discussed

## 2021-04-05 NOTE — Patient Instructions (Addendum)
It was wonderful to meet you today. Thank you for allowing me to be a part of your care. Below is a short summary of what we discussed at your visit today:  BPPV - Vertigo - Continue to take the meclizine as needed. I have sent a refill to your pharmacy - Do the Eply maneuver at home in bed or on the floor (hand out printed and given with this paperwork) - I have referred you to neuro-vestibular rehab. This is a physical therapy focused on people with symptoms like yours.  - Come back if the symptoms get worse - I would expect resolution in a couple weeks if you do all the above   If you have any questions or concerns, please do not hesitate to contact us via phone or MyChart message.   Ezequiel Essex, MD

## 2021-04-15 ENCOUNTER — Ambulatory Visit: Payer: BC Managed Care – PPO | Admitting: Podiatry

## 2021-04-25 ENCOUNTER — Ambulatory Visit (INDEPENDENT_AMBULATORY_CARE_PROVIDER_SITE_OTHER)
Admission: RE | Admit: 2021-04-25 | Discharge: 2021-04-25 | Disposition: A | Discharge status: Home / Self Care | Payer: BC Managed Care – PPO | Source: Ambulatory Visit | Attending: Cardiovascular Disease | Admitting: Cardiovascular Disease

## 2021-04-25 ENCOUNTER — Other Ambulatory Visit: Payer: Self-pay

## 2021-04-25 DIAGNOSIS — R911 Solitary pulmonary nodule: Secondary | ICD-10-CM | POA: Diagnosis not present

## 2021-04-25 DIAGNOSIS — I712 Thoracic aortic aneurysm, without rupture, unspecified: Secondary | ICD-10-CM

## 2021-04-25 DIAGNOSIS — K449 Diaphragmatic hernia without obstruction or gangrene: Secondary | ICD-10-CM | POA: Diagnosis not present

## 2021-04-25 MED ORDER — IOHEXOL 350 MG/ML SOLN
100.0000 mL | Freq: Once | INTRAVENOUS | Status: AC | PRN
  Administered 2021-04-25: 100 mL via INTRAVENOUS

## 2021-04-27 ENCOUNTER — Other Ambulatory Visit: Payer: Self-pay | Admitting: Cardiovascular Disease

## 2021-04-28 ENCOUNTER — Ambulatory Visit: Payer: BC Managed Care – PPO

## 2021-05-03 DIAGNOSIS — G4733 Obstructive sleep apnea (adult) (pediatric): Secondary | ICD-10-CM | POA: Diagnosis not present

## 2021-05-13 ENCOUNTER — Ambulatory Visit: Payer: BC Managed Care – PPO | Admitting: Podiatry

## 2021-06-20 ENCOUNTER — Ambulatory Visit (INDEPENDENT_AMBULATORY_CARE_PROVIDER_SITE_OTHER): Payer: BC Managed Care – PPO | Admitting: Family Medicine

## 2021-06-20 ENCOUNTER — Other Ambulatory Visit: Payer: Self-pay

## 2021-06-20 VITALS — BP 149/92 | HR 63 | Temp 98.8°F | Ht 69.0 in

## 2021-06-20 DIAGNOSIS — R052 Subacute cough: Secondary | ICD-10-CM | POA: Diagnosis not present

## 2021-06-20 DIAGNOSIS — H8113 Benign paroxysmal vertigo, bilateral: Secondary | ICD-10-CM

## 2021-06-20 MED ORDER — BENZONATATE 200 MG PO CAPS: 200.0000 mg | ORAL_CAPSULE | Freq: Two times a day (BID) | ORAL | 0 refills | Status: DC | PRN

## 2021-06-20 NOTE — Patient Instructions (Signed)
I do not feel he can need any testing today as he recently tested negative for COVID at home and his wife has had flu recently.  I do wonder if you have been infected with flu and I really just been experiencing the cough.  Since she tested positive twice, I am unsure if you have been reinfected in the last couple weeks.  Cough can last anywhere from 4 to 6 weeks sometimes for people.  If this continues for the next 2 to 3 weeks then please let our office know.  I am sending in a medication called Tessalon Perles, it is a medication you can try that sometimes it helps people with their cough, but not everybody gets benefit.  If he can have extreme shortness of breath, increasing cough with more mucus being produced, coughing up blood then please call our office back.

## 2021-06-20 NOTE — Progress Notes (Signed)
    SUBJECTIVE:   CHIEF COMPLAINT / HPI:   Patient presents with cough that has been going on and on for the last 6 weeks.  He reports that he will have an increase in his cough for about 1 week, the cough will somewhat improved for 1 to 2 days and then will come back.  He notes that he has tried multiple over-the-counter medications and home remedies such as honey.  His wife and stepdaughter have had the flu recently.  His wife has been diagnosed with the flu twice in the last 8 weeks (last positive test was about 2 weeks ago).  He is not coughing up anything at this time but previously has been coughing up greenish-yellow phlegm.  He took a COVID test at home and it was negative.  Patient also notes that his vertigo has come back in the last couple of days.  He notes that it is just in the morning, worse if he turns his head while laying in bed and goes away on its own after about 45 minutes.  Patient does have meclizine that he was given previously by the ER, he states that he does not currently use it.  PERTINENT  PMH / PSH: Reviewed  OBJECTIVE:   BP (!) 149/92   Pulse 63   Temp 98.8 F (37.1 C) (Oral)   Ht 5\' 9"  (1.753 m)   SpO2 97%   BMI 39.07 kg/m   Gen: well-appearing, NAD CV: RRR, no m/r/g appreciated, no peripheral edema Pulm: CTAB, no wheezes/crackles HEENT: Throat nonerythematous and without oral lesions, no cervical lymphadenopathy present.  ASSESSMENT/PLAN:   Cough Intermittently worse cough over the last 6 weeks.  Family was have recently been diagnosed with flu, has not had flu twice in the last 8 weeks and most recently 2 weeks ago.  Unsure if patient has been reinfected with a viral illness that is lengthening his duration of cough.  Reassured by no fever and no continued symptoms no shortness of breath, or respiratory distress.  Discussed continuation of OTC medications as appropriate and homeopathic remedies such as honey.  Sent in [place] (though not covered  by insurance) to see if it is affordable and may help with the cough.  Gave patient strict return precautions, especially if the cough is continuing after 3 more weeks.  BPPV (benign paroxysmal positional vertigo) BPPV has restarted in the last week, only last about 45 minutes in the morning.  Is not currently affecting the patient much.  He does have meclizine at home from a prior prescription, but does not feel the need to use it right now.  Prior referral to neuro vestibular rehab was ordered.  Give patient another handout on Epley maneuvers.     [name], [place]

## 2021-06-20 NOTE — Assessment & Plan Note (Addendum)
BPPV has restarted in the last week, only last about 45 minutes in the morning.  Is not currently affecting the patient much.  He does have meclizine at home from a prior prescription, but does not feel the need to use it right now.  Prior referral to neuro vestibular rehab was ordered.  Give patient another handout on Epley maneuvers.

## 2021-06-24 ENCOUNTER — Encounter: Payer: Self-pay | Admitting: Family Medicine

## 2021-07-04 ENCOUNTER — Other Ambulatory Visit: Payer: Self-pay | Admitting: Family Medicine

## 2021-07-04 DIAGNOSIS — R972 Elevated prostate specific antigen [PSA]: Secondary | ICD-10-CM

## 2021-07-04 DIAGNOSIS — Z87438 Personal history of other diseases of male genital organs: Secondary | ICD-10-CM

## 2021-07-07 ENCOUNTER — Encounter: Payer: Self-pay | Admitting: Family Medicine

## 2021-07-12 NOTE — Progress Notes (Signed)
PCP:  [name], DO Primary Cardiologist: [name], MD Electrophysiologist: [name], MD   Jeffrey Griffin is a 60 y.o. male seen today for [name], MD for routine electrophysiology followup.  Since last being seen in our clinic the patient reports doing OK. He had a recent spell of vertigo and had to wait 18 hours in the ED prior to being seen. He had been told to watch out for dizziness if his thoracic aneurysm ever burst, so was sitting in fear the whole time. He has mild dyspnea with moderate exertion. Avoids stairs. Not currently exercising, but trying to get back into it. he denies chest pain, palpitations, PND, orthopnea, nausea, vomiting, syncope, edema, weight gain, or early satiety.  Past Medical History:  Diagnosis Date   Abnormality of thoracic aorta    4.3 CM ECTATIC ASENDING    Cardiopathy    CHF (congestive heart failure) (HCC)    SYSTOLIC   Hyperlipidemia    Hypertension    Lung nodule    Obesity    Persistent atrial fibrillation (HCC)    WITH RVR   Pleural effusion    RIGHT UPPER LOBE AND RIGHT LOWER LOBE    Past Surgical History:  Procedure Laterality Date   APPENDECTOMY     ATRIAL FIBRILLATION ABLATION N/A 07/16/2020   Procedure: ATRIAL FIBRILLATION ABLATION;  Surgeon: [name], MD;  Location: [place] CV LAB;  Service: Cardiovascular;  Laterality: N/A;   CARDIOVERSION N/A 10/20/2019   Procedure: CARDIOVERSION;  Surgeon: Acie Fredrickson Wonda Cheng, MD;  Location: [facility] ENDOSCOPY;  Service: Cardiovascular;  Laterality: N/A;   CARDIOVERSION N/A 05/31/2020   Procedure: CARDIOVERSION;  Surgeon: Josue Hector, MD;  Location: [facility] ENDOSCOPY;  Service: Cardiovascular;  Laterality: N/A;   VASECTOMY      Current Outpatient Medications  Medication Sig Dispense Refill   Ascorbic Acid (VITAMIN C) POWD Take by mouth.     atorvastatin (LIPITOR) 40 MG tablet Take 1 tablet (40 mg total) by mouth daily. 90 tablet 3   benzonatate (TESSALON) 200 MG capsule Take 1  capsule (200 mg total) by mouth 2 (two) times daily as needed for cough. 20 capsule 0   Carnitine-B5-B6 500-15-5 MG TABS Take by mouth.     Cholecalciferol (VITAMIN D3) 25 MCG (1000 UT) CAPS Take 1,000 Units by mouth daily.      Citrulline POWD Take by mouth.     CREATINE MONOHYDRATE PO Take by mouth.     Cyanocobalamin (B-12) 5000 MCG CAPS Take by mouth.     D-Ribose POWD by Does not apply route.     diclofenac Sodium (VOLTAREN) 1 % GEL Apply 4 g topically 4 (four) times daily. 350 g 0   ELIQUIS 5 MG TABS tablet TAKE 1 TABLET BY MOUTH TWICE A DAY 60 tablet 10   Garlic 5427 MG CAPS Take by mouth.     Glucosamine-Chondroitin (COSAMIN DS PO) Take 2 tablets by mouth daily.     meclizine (ANTIVERT) 25 MG tablet Take 1 tablet (25 mg total) by mouth 3 (three) times daily as needed for dizziness. 90 tablet 1   metoprolol succinate (TOPROL-XL) 50 MG 24 hr tablet Take 1 tablet (50 mg total) by mouth daily. Take with or immediately following a meal. 90 tablet 3   Multiple Minerals-Vitamins (CAL MAG ZINC +D3 PO) Take 1 tablet by mouth daily.      Multiple Vitamin (MULTIVITAMIN WITH MINERALS) TABS tablet Take 1 tablet by mouth daily.     Omega-3 1400  MG CAPS Take by mouth.     sacubitril-valsartan (ENTRESTO) 49-51 MG Take 1 tablet by mouth 2 (two) times daily. Please call and schedule an appointment for further refills 1st attempt 180 tablet 0   Taurine 1000 MG CAPS Take by mouth.     vitamin E 180 MG (400 UNITS) capsule Take 400 Units by mouth daily.     Zn-Pyg Afri-Nettle-Saw Palmet (SAW PALMETTO COMPLEX PO) Take by mouth.     No current facility-administered medications for this visit.    No Known Allergies  Social History   Socioeconomic History   Marital status: Married    Spouse name: Not on file   Number of children: Not on file   Years of education: Not on file   Highest education level: Not on file  Occupational History   Not on file  Tobacco Use   Smoking status: Never   Smokeless  tobacco: Never  Substance and Sexual Activity   Alcohol use: Not Currently   Drug use: No   Sexual activity: Not on file  Other Topics Concern   Not on file  Social History Narrative   Lives in [place].   Social Determinants of Health   Financial Resource Strain: Not on file  Food Insecurity: Not on file  Transportation Needs: Not on file  Physical Activity: Not on file  Stress: Not on file  Social Connections: Not on file  Intimate Partner Violence: Not on file     Review of Systems: All other systems reviewed and are otherwise negative except as noted above.  Physical Exam: Vitals:   07/13/21 0759  BP: 140/80  Pulse: 70  SpO2: 97%  Weight: 237 lb (107.5 kg)  Height: 5\' 9"  (1.753 m)    GEN- The patient is well appearing, alert and oriented x 3 today.   HEENT: normocephalic, atraumatic; sclera clear, conjunctiva pink; hearing intact; oropharynx clear; neck supple, no JVP Lymph- no cervical lymphadenopathy Lungs- Clear to ausculation bilaterally, normal work of breathing.  No wheezes, rales, rhonchi Heart- Regular rate and rhythm, no murmurs, rubs or gallops, PMI not laterally displaced GI- soft, non-tender, non-distended, bowel sounds present, no hepatosplenomegaly Extremities- no clubbing, cyanosis, or edema; DP/PT/radial pulses 2+ bilaterally MS- no significant deformity or atrophy Skin- warm and dry, no rash or lesion Psych- euthymic mood, full affect Neuro- strength and sensation are intact  EKG is not ordered. EKG 03/29/21 unremarkable  Additional studies reviewed include: Previous EP office notes.   Assessment and Plan:  1. Persistent atrial fibrillation and atrial flutter s/p ablation Doing well overall.  Asymptomatic on Toprol 50 mg daily.  Continue eliquis for CHA2DS2VASC of at least 3.   Labs today.    2. Severe OSA Encouraged nightly BiPAP.   3. Tachycardia mediated CM Echo 10/22/20 shows normalization of EF s/p AF ablation and  maintenance of NSR Continue Entresto and Toprol   4. HTN Stable on current regimen    5. Morbid obesity Body mass index is 35 kg/m.  Lifestyle management has been encouraged  6. Thoracic aortic aneurysm  42 mm by most recent echo. He will continue annual monitoring.   Follow up with Dr. Rayann Heman in 6 months   Shirley Friar, PA-C  07/13/21 8:13 AM

## 2021-07-13 ENCOUNTER — Ambulatory Visit: Payer: BC Managed Care – PPO | Admitting: Student

## 2021-07-13 ENCOUNTER — Encounter: Payer: Self-pay | Admitting: Student

## 2021-07-13 ENCOUNTER — Other Ambulatory Visit: Payer: Self-pay

## 2021-07-13 VITALS — BP 140/80 | HR 70 | Ht 69.0 in | Wt 237.0 lb

## 2021-07-13 DIAGNOSIS — G4733 Obstructive sleep apnea (adult) (pediatric): Secondary | ICD-10-CM | POA: Diagnosis not present

## 2021-07-13 DIAGNOSIS — I4819 Other persistent atrial fibrillation: Secondary | ICD-10-CM | POA: Diagnosis not present

## 2021-07-13 DIAGNOSIS — I428 Other cardiomyopathies: Secondary | ICD-10-CM | POA: Diagnosis not present

## 2021-07-13 LAB — BASIC METABOLIC PANEL
BUN/Creatinine Ratio: 22 — ABNORMAL HIGH (ref 9–20)
BUN: 20 mg/dL (ref 6–24)
CO2: 26 mmol/L (ref 20–29)
Calcium: 10.6 mg/dL — ABNORMAL HIGH (ref 8.7–10.2)
Chloride: 102 mmol/L (ref 96–106)
Creatinine, Ser: 0.92 mg/dL (ref 0.76–1.27)
Glucose: 105 mg/dL — ABNORMAL HIGH (ref 70–99)
Potassium: 4.2 mmol/L (ref 3.5–5.2)
Sodium: 139 mmol/L (ref 134–144)
eGFR: 96 mL/min/{1.73_m2} (ref >59)

## 2021-07-13 LAB — CBC
Hematocrit: 45.9 % (ref 37.5–51.0)
Hemoglobin: 15.3 g/dL (ref 13.0–17.7)
MCH: 29.5 pg (ref 26.6–33.0)
MCHC: 33.3 g/dL (ref 31.5–35.7)
MCV: 89 fL (ref 79–97)
Platelets: 244 10*3/uL (ref 150–450)
RBC: 5.18 x10E6/uL (ref 4.14–5.80)
RDW: 11.9 % (ref 11.6–15.4)
WBC: 6.4 10*3/uL (ref 3.4–10.8)

## 2021-07-13 NOTE — Patient Instructions (Signed)
Medication Instructions:  Your physician recommends that you continue on your current medications as directed. Please refer to the Current Medication list given to you today.  *If you need a refill on your cardiac medications before your next appointment, please call your pharmacy*   Lab Work: TODAY: BMET, CBC  If you have labs (blood work) drawn today and your tests are completely normal, you will receive your results only by: [place] (if you have MyChart) OR A paper copy in the mail If you have any lab test that is abnormal or we need to change your treatment, we will call you to review the results.   Follow-Up: At [facility], you and your health needs are our priority.  As part of our continuing mission to provide you with exceptional heart care, we have created designated Provider Care Teams.  These Care Teams include your primary Cardiologist (physician) and Advanced Practice Providers (APPs -  Physician Assistants and Nurse Practitioners) who all work together to provide you with the care you need, when you need it.   Your next appointment:   6 month(s)  The format for your next appointment:   In Person  Provider:   Legrand Como "Oda Kilts, PA-C  {

## 2021-07-24 ENCOUNTER — Other Ambulatory Visit: Payer: Self-pay | Admitting: Cardiovascular Disease

## 2021-07-26 MED ORDER — ENTRESTO 49-51 MG PO TABS: 1.0000 | ORAL_TABLET | Freq: Two times a day (BID) | ORAL | 0 refills | Status: DC

## 2021-08-15 ENCOUNTER — Other Ambulatory Visit: Payer: Self-pay | Admitting: Internal Medicine

## 2021-08-15 NOTE — Telephone Encounter (Signed)
Eliquis 5mg  refill request received. Patient is 60 years old, weight-107.5kg, Crea-0.92 on 07/13/2021, Diagnosis-Afib, and last seen by Oda Kilts on 07/13/2021. Dose is appropriate based on dosing criteria. Will send in refill to requested pharmacy.

## 2021-09-07 ENCOUNTER — Other Ambulatory Visit: Payer: Self-pay | Admitting: Family Medicine

## 2021-09-07 DIAGNOSIS — R42 Dizziness and giddiness: Secondary | ICD-10-CM

## 2021-10-05 ENCOUNTER — Ambulatory Visit: Payer: BC Managed Care – PPO | Admitting: Cardiovascular Disease

## 2021-10-06 ENCOUNTER — Other Ambulatory Visit: Payer: Self-pay | Admitting: Internal Medicine

## 2021-11-04 ENCOUNTER — Encounter: Payer: Self-pay | Admitting: Family Medicine

## 2021-11-04 ENCOUNTER — Other Ambulatory Visit: Payer: Self-pay

## 2021-11-04 ENCOUNTER — Ambulatory Visit (INDEPENDENT_AMBULATORY_CARE_PROVIDER_SITE_OTHER): Payer: No Typology Code available for payment source | Admitting: Family Medicine

## 2021-11-04 VITALS — BP 138/78 | HR 71 | Ht 69.0 in | Wt 236.2 lb

## 2021-11-04 DIAGNOSIS — Z0000 Encounter for general adult medical examination without abnormal findings: Secondary | ICD-10-CM

## 2021-11-04 DIAGNOSIS — Z1211 Encounter for screening for malignant neoplasm of colon: Secondary | ICD-10-CM

## 2021-11-04 NOTE — Patient Instructions (Addendum)
It was so great seeing you today! Today we discussed the following:  - It appears that you were seen by Dr. Collene Mares previously for your colonoscopy. We will try to reach out and see if we can figure out when your next one will be due exactly  Please make sure to bring any medications you take to your appointments. If you have any questions or concerns please call the office at [phone].      DASH Eating Plan DASH stands for Dietary Approaches to Stop Hypertension. The DASH eating plan is a healthy eating plan that has been shown to: Reduce high blood pressure (hypertension). Reduce your risk for type 2 diabetes, heart disease, and stroke. Help with weight loss. What are tips for following this plan? Reading food labels Check food labels for the amount of salt (sodium) per serving. Choose foods with less than 5 percent of the Daily Value of sodium. Generally, foods with less than 300 milligrams (mg) of sodium per serving fit into this eating plan. To find whole grains, look for the word "whole" as the first word in the ingredient list. Shopping Buy products labeled as "low-sodium" or "no salt added." Buy fresh foods. Avoid canned foods and pre-made or frozen meals. Cooking Avoid adding salt when cooking. Use salt-free seasonings or herbs instead of table salt or sea salt. Check with your health care provider or pharmacist before using salt substitutes. Do not fry foods. Cook foods using healthy methods such as baking, boiling, grilling, roasting, and broiling instead. Cook with heart-healthy oils, such as olive, canola, avocado, soybean, or sunflower oil. Meal planning  Eat a balanced diet that includes: 4 or more servings of fruits and 4 or more servings of vegetables each day. Try to fill one-half of your plate with fruits and vegetables. 6-8 servings of whole grains each day. Less than 6 oz (170 g) of lean meat, poultry, or fish each day. A 3-oz (85-g) serving of meat is about the  same size as a deck of cards. One egg equals 1 oz (28 g). 2-3 servings of low-fat dairy each day. One serving is 1 cup (237 mL). 1 serving of nuts, seeds, or beans 5 times each week. 2-3 servings of heart-healthy fats. Healthy fats called omega-3 fatty acids are found in foods such as walnuts, flaxseeds, fortified milks, and eggs. These fats are also found in cold-water fish, such as sardines, salmon, and mackerel. Limit how much you eat of: Canned or prepackaged foods. Food that is high in trans fat, such as some fried foods. Food that is high in saturated fat, such as fatty meat. Desserts and other sweets, sugary drinks, and other foods with added sugar. Full-fat dairy products. Do not salt foods before eating. Do not eat more than 4 egg yolks a week. Try to eat at least 2 vegetarian meals a week. Eat more home-cooked food and less restaurant, buffet, and fast food. Lifestyle When eating at a restaurant, ask that your food be prepared with less salt or no salt, if possible. If you drink alcohol: Limit how much you use to: 0-1 drink a day for women who are not pregnant. 0-2 drinks a day for men. Be aware of how much alcohol is in your drink. In the [place] one drink equals one 12 oz bottle of beer (355 mL), one 5 oz glass of wine (148 mL), or one 1 oz glass of hard liquor (44 mL). General information Avoid eating more than 2,300 mg of salt a  day. If you have hypertension, you may need to reduce your sodium intake to 1,500 mg a day. Work with your health care provider to maintain a healthy body weight or to lose weight. Ask what an ideal weight is for you. Get at least 30 minutes of exercise that causes your heart to beat faster (aerobic exercise) most days of the week. Activities may include walking, swimming, or biking. Work with your health care provider or dietitian to adjust your eating plan to your individual calorie needs. What foods should I eat? Fruits All fresh, dried, or frozen  fruit. Canned fruit in natural juice (without added sugar). Vegetables Fresh or frozen vegetables (raw, steamed, roasted, or grilled). Low-sodium or reduced-sodium tomato and vegetable juice. Low-sodium or reduced-sodium tomato sauce and tomato paste. Low-sodium or reduced-sodium canned vegetables. Grains Whole-grain or whole-wheat bread. Whole-grain or whole-wheat pasta. Brown rice. [place]. Bulgur. Whole-grain and low-sodium cereals. Pita bread. Low-fat, low-sodium crackers. Whole-wheat flour tortillas. Meats and other proteins Skinless chicken or [place]. Ground chicken or [place]. Pork with fat trimmed off. Fish and seafood. Egg whites. Dried beans, peas, or lentils. Unsalted nuts, nut butters, and seeds. Unsalted canned beans. Lean cuts of beef with fat trimmed off. Low-sodium, lean precooked or cured meat, such as sausages or meat loaves. Dairy Low-fat (1%) or fat-free (skim) milk. Reduced-fat, low-fat, or fat-free cheeses. Nonfat, low-sodium ricotta or cottage cheese. Low-fat or nonfat yogurt. Low-fat, low-sodium cheese. Fats and oils Soft margarine without trans fats. Vegetable oil. Reduced-fat, low-fat, or light mayonnaise and salad dressings (reduced-sodium). Canola, safflower, olive, avocado, soybean, and sunflower oils. Avocado. Seasonings and condiments Herbs. Spices. Seasoning mixes without salt. Other foods Unsalted popcorn and pretzels. Fat-free sweets. The items listed above may not be a complete list of foods and beverages you can eat. Contact a dietitian for more information. What foods should I avoid? Fruits Canned fruit in a light or heavy syrup. Fried fruit. Fruit in cream or butter sauce. Vegetables Creamed or fried vegetables. Vegetables in a cheese sauce. Regular canned vegetables (not low-sodium or reduced-sodium). Regular canned tomato sauce and paste (not low-sodium or reduced-sodium). Regular tomato and vegetable juice (not low-sodium or reduced-sodium).  Angie Fava. Olives. Grains Baked goods made with fat, such as croissants, muffins, or some breads. Dry pasta or rice meal packs. Meats and other proteins Fatty cuts of meat. Ribs. Fried meat. Berniece Salines. Bologna, salami, and other precooked or cured meats, such as sausages or meat loaves. Fat from the back of a pig (fatback). Bratwurst. Salted nuts and seeds. Canned beans with added salt. Canned or smoked fish. Whole eggs or egg yolks. Chicken or [place] with skin. Dairy Whole or 2% milk, cream, and half-and-half. Whole or full-fat cream cheese. Whole-fat or sweetened yogurt. Full-fat cheese. Nondairy creamers. Whipped toppings. Processed cheese and cheese spreads. Fats and oils Butter. Stick margarine. Lard. Shortening. Ghee. Bacon fat. Tropical oils, such as coconut, palm kernel, or palm oil. Seasonings and condiments Onion salt, garlic salt, seasoned salt, table salt, and sea salt. Worcestershire sauce. Tartar sauce. Barbecue sauce. Teriyaki sauce. Soy sauce, including reduced-sodium. Steak sauce. Canned and packaged gravies. Fish sauce. Oyster sauce. Cocktail sauce. Store-bought horseradish. Ketchup. Mustard. Meat flavorings and tenderizers. Bouillon cubes. Hot sauces. Pre-made or packaged marinades. Pre-made or packaged taco seasonings. Relishes. Regular salad dressings. Other foods Salted popcorn and pretzels. The items listed above may not be a complete list of foods and beverages you should avoid. Contact a dietitian for more information. Where to find more information National Heart,  Lung, and Blood Institute: https://wilson-eaton.com/ American Heart Association: www.heart.org Academy of Nutrition and Dietetics: www.eatright.[place]: www.kidney.org Summary The DASH eating plan is a healthy eating plan that has been shown to reduce high blood pressure (hypertension). It may also reduce your risk for type 2 diabetes, heart disease, and stroke. When on the DASH eating plan, aim to  eat more fresh fruits and vegetables, whole grains, lean proteins, low-fat dairy, and heart-healthy fats. With the DASH eating plan, you should limit salt (sodium) intake to 2,300 mg a day. If you have hypertension, you may need to reduce your sodium intake to 1,500 mg a day. Work with your health care provider or dietitian to adjust your eating plan to your individual calorie needs. This information is not intended to replace advice given to you by your health care provider. Make sure you discuss any questions you have with your health care provider. Document Revised: 07/25/2019 Document Reviewed: 07/25/2019 Elsevier Patient Education  2022 [employer].

## 2021-11-04 NOTE — Progress Notes (Signed)
? ? ?  SUBJECTIVE:  ? ?Chief compliant/HPI: annual examination ? ?Jeffrey Griffin is a 61 y.o. who presents today for an annual exam.  ? ?Review of systems form notable for generalized fatigue, which he notes has been present for several months at this point.  We have discussed it previously and nothing has significantly changed and he has had some lab evaluation for it.   ?The only other notable symptom most recently is that he will have intermittent chest pain, which has been present for several months (since his ablation) and is reproducible.  His cardiologist is aware of it and he has an appointment in a few weeks. ?Patient is also recently seen his urologist and is going to undergo a prostatic MRI to determine if a biopsy is necessary given elevated PSA. ? ?Has not had to take the meclizine since end of August  ? ?Updated history tabs and problem list.  ? ?OBJECTIVE:  ? ?BP (!) 158/86   Pulse 71   Ht 5\' 9"  (1.753 m)   Wt 236 lb 3.2 oz (107.1 kg)   SpO2 98%   BMI 34.88 kg/m?   ?Gen: well-appearing, NAD ?CV: RRR, no m/r/g appreciated, no peripheral edema ?Pulm: CTAB, no wheezes/crackles ?GI: soft, non-tender, non-distended ?MSK: chest wall pain reproducible to palpation on the left side ? ?ASSESSMENT/PLAN:  ? ?Chest wall pain ?Appears to be intermittent and overall stable. Following with cardiology, reassuringly reproducible on palpation.  At this time unsure if related to cardiogenic etiology but patient is following up with his cardiologist in the next few weeks. ? ?Fatigue, generalized ?Patient still having generalized fatigue, appears stable at this time.  Consideration that it might be related to medical conditions or adverse reaction from medications. Thyroid, CBC, and CMP checked last year when initially describing the symptoms and all within normal limits. Will continue to monitor at this time.  ? ?Annual Examination  ?See AVS for age appropriate recommendations.  ? ?PHQ score 1, reviewed and  discussed. ?Blood pressure reviewed and at goal 138/78.  ?Asked about intimate partner violence and patient reports None.  ? ?Considered the following items based upon USPSTF recommendations: ?HIV testing: discussed, previously done ?Hepatitis C: discussed, previously done ?Hepatitis B: discussed, not ordered ?Syphilis if at high risk: discussed and not ordered ?GC/CT not at high risk and not ordered. ?Lipid panel (nonfasting or fasting) discussed based upon AHA recommendations and not ordered.  Consider repeat every 4-6 years.  ?Reviewed risk factors for latent tuberculosis and not indicated ?Immunizations: declines COVID vaccine  ? ?Follow up in 1 year or sooner if indicated.  ? ? ?Jeffrey Quebedeaux, DO ?[place]  ? ?

## 2021-11-07 ENCOUNTER — Other Ambulatory Visit: Payer: Self-pay | Admitting: Cardiovascular Disease

## 2021-11-08 MED ORDER — ENTRESTO 49-51 MG PO TABS: 1.0000 | ORAL_TABLET | Freq: Two times a day (BID) | ORAL | 0 refills | Status: DC

## 2021-11-27 ENCOUNTER — Other Ambulatory Visit: Payer: Self-pay | Admitting: Cardiovascular Disease

## 2021-11-28 ENCOUNTER — Encounter: Payer: Self-pay | Admitting: Cardiovascular Disease

## 2021-11-28 ENCOUNTER — Other Ambulatory Visit: Payer: Self-pay | Admitting: Cardiovascular Disease

## 2021-11-28 ENCOUNTER — Telehealth: Payer: Self-pay

## 2021-11-28 ENCOUNTER — Encounter: Payer: Self-pay | Admitting: Family Medicine

## 2021-11-28 NOTE — Telephone Encounter (Signed)
**Note De-Identified [name]** Per pt request [name] [facility] message I started a Eliquis PA through covermymeds and received this message: ?Jeffrey Griffin Key: [id number] - PA Case ID: [id number] ?Outcome:This request has been approved using information available on the patient's profile. Type:Prior Auth;Coverage Start Date:10/29/2021;Coverage End Date:11/28/2022; ?Drug ?Eliquis '5MG'$  tablets ?Form: [employer] Electronic PA Form [id number] NCPDP) ? ?I have notified the pt Jeffrey Griffin his [facility] message and [id number] [place], [place](Ph: [phone] of this approval. ?

## 2021-11-30 ENCOUNTER — Other Ambulatory Visit: Payer: Self-pay | Admitting: Cardiovascular Disease

## 2021-12-04 NOTE — Progress Notes (Signed)
?Cardiology Office Note:   ? ?Date:  12/05/2021  ? ?ID:  Jeffrey Griffin, DOB 1960/10/16, MRN [id number] ? ?PCP:  [name], DO  ?Cardiologist:  Revankar, Asiel Chrostowski  ?Electrophysiologist:  [name], MD  ? ?Referring MD: [name], DO  ? ?Chief Complaint  ?Patient presents with  ? Atrial Fibrillation  ? ? ?Problem list ?1.  Acute pulmonary embolism - Jan. 11, 2021  ?2.  Atrial fibrillation ?3.   Ascending thoracic aortic aneurysm ( 4.3 cm)  ?4.  Acute systolic congestive heart failure-ejection fraction 20 to 25%. ? ? ? ?Previous notes:   ? ?Jeffrey Griffin is a 61 y.o. male with a recent hospitalization at [facility] for atrial fibrillation.  He was originally seen down in [facility] but wanted to follow-up in [place]. ? ?His symptoms started sometime prior to Christmas.  He presented to the urgent care with 4 days of severe shortness of breath and orthopnea.  He was found to have atrial fibrillation.  CT angiogram of the chest showed acute subsegmental pulmonary embolism to the right upper and right lower lobes.  He also was found to have a 4.3 cm ascending aortic aneurysm and a 4 mm right lung nodule. ? ?No stress test or cath performed.  ?No angina  ? ?Family hx of cardiac disease ?Mom has IHSS ?Father had CVAs  ?Brother had Afib / ablation ?Sister has HTN  ? ?Cannot tell that his HR is very iireg.    ?Breathing is better.  ? ? ?No leg trauma,  No long trips to explain his pulmonary embolus.  ? ? ?Feeling better.  ?A bit fatigued ?Used to drink coffee and has been avoiding it recently  ? ?Prior to hospitalization , did not follow any diet .  ?Takes a salad to work for lunch  . ?Breakfast is scrambled egg white, with veggies + chicken  ?Dinner is salad, veggies,   ?Eats suasage several times a week ,  ? 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  ? ?November 10, 2019: ?Jeffrey Griffin is seen back today for follow-up of his atrial fibrillation, congestive heart failure, and pulmonary embolus, and 4.3  cm ascending aortic aneurysm. ? ?He had a successful cardioversion on October 20, 2019. ?Feeling much better after cardioversion ?Still has some episodes of dyspnea.  ?Has gained weight this past week .  ? ?Has been trying to avoid salty foods.  ?No CP. ?No palpitations  ? ?December 11, 2019:  ?Jeffrey Griffin is seen today for a follow of his atrial fibrillation, congestive heart failure, pulmonary embolus and 4.3 cm ascending aortic aneurysm. ?Had a successful cardioversion in February, 2021. ?Occasional dyspnea.  Not with exertion .  ?Has been working out 3 times a week.   ? ?April 06, 2020: ?Jeffrey Griffin is seen today for follow-up of his paroxysmal atrial fibrillation, congestive heart failure, pulmonary loss, and moderate ascending aortic aneurysm.  He had a successful cardioversion in February, 2021. ?Cannot tell if his HR is regular or not  ( he is back in AF today )  ?Has changed jobs.  Lost his insurance ?Now is on new insurance,    ?Lipids and cmet are reviewed and are improving  ? ?Jan. 28, 2022 ?Jeffrey Griffin is seen today for follow up of his HTN, CHF, PAF, pulmonary embolus, ascending aortic aneurism. ? ?Has some fatigue, HR is slow even when working out  ?Had Afib ablation Nov. 12, 2021 - is on amio for the next month. ?Is back on Toprol  100 mg a day  ?Is on BIPAP for OSA  ? ?CT angio in April 20, 2020 reveals a mild ascending thoracic aortic dilatation at 4.0.  This area previously measured 4.3 cm. ?He remains on Eliquis for his history of pulmonary emboli.  He remains on Entresto, Toprol-XL for his congestive heart failure. ? ? ?December 05, 2021 ?Jeffrey Griffin is seen for follow up of his HTN, CHF, PAF ( had Afib ablation in Nov. 2021) , pulmonary embolus ans asc. Aortic aneurism ? ?Rare stabbing pains ,  off and on all day .   Does not change much  ?Not worsened with deep breath, ?Does not sound like angina .  ?Not worsened with activity  ?Wakes up with it occasion  ? ?BP has been mildly elevated.  ? ?Feels sleepy constantly  ?Wears  his CPAP.   His feedback numbers look ok  ? ?OK for him to hold his Eliquis for 3 days before and after his prostate bx ?He is at low risk for his prostate bx on April 20 ? ? ?Past Medical History:  ?Diagnosis Date  ? Abnormality of thoracic aorta   ? 4.3 CM ECTATIC ASENDING   ? Cardiopathy   ? CHF (congestive heart failure) ([place])   ? SYSTOLIC  ? Hyperlipidemia   ? Hypertension   ? Lung nodule   ? Obesity   ? Persistent atrial fibrillation ([place])   ? WITH RVR  ? Pleural effusion   ? RIGHT UPPER LOBE AND RIGHT LOWER LOBE   ? ? ?Past Surgical History:  ?Procedure Laterality Date  ? APPENDECTOMY    ? ATRIAL FIBRILLATION ABLATION N/A 07/16/2020  ? Procedure: ATRIAL FIBRILLATION ABLATION;  Surgeon: [name], MD;  Location: [facility] [place] [facility];  Service: Cardiovascular;  Laterality: N/A;  ? CARDIOVERSION N/A 10/20/2019  ? Procedure: CARDIOVERSION;  Surgeon: Thayer Headings, MD;  Location: [facility];  Service: Cardiovascular;  Laterality: N/A;  ? CARDIOVERSION N/A 05/31/2020  ? Procedure: CARDIOVERSION;  Surgeon: Josue Hector, MD;  [facility];  Service: Cardiovascular;  Laterality: N/A;  ? VASECTOMY    ? ? ?Current Medications: ?Current Meds  ?Medication Sig  ? apixaban (ELIQUIS) 5 MG TABS tablet TAKE 1 TABLET BY MOUTH TWICE A DAY  ? Ascorbic Acid (VITAMIN C) POWD Take by mouth.  ? atorvastatin (LIPITOR) 40 MG tablet TAKE 1 TABLET BY MOUTH EVERY DAY  ? Carnitine-B5-B6 500-15-5 MG TABS Take by mouth.  ? Cholecalciferol (VITAMIN D3) 25 MCG (1000 UT) CAPS Take 1,000 Units by mouth daily.   ? Citrulline POWD Take by mouth.  ? CREATINE MONOHYDRATE PO Take by mouth.  ? Cyanocobalamin (B-12) 5000 MCG CAPS Take by mouth.  ? D-Ribose POWD by Does not apply route.  ? diclofenac Sodium (VOLTAREN) 1 % GEL Apply 4 g topically 4 (four) times daily.  ? Garlic 7106 MG CAPS Take by mouth.  ? Glucosamine-Chondroitin (COSAMIN DS PO) Take 2 tablets by mouth daily.  ? meclizine (ANTIVERT) 25 MG tablet TAKE 1 TABLET BY MOUTH  3 TIMES DAILY AS NEEDED FOR DIZZINESS.  ? metoprolol succinate (TOPROL-XL) 50 MG 24 hr tablet TAKE 1 TABLET BY MOUTH DAILY. TAKE WITH OR IMMEDIATELY FOLLOWING A MEAL.  ? Multiple Minerals-Vitamins (CAL MAG ZINC +D3 PO) Take 1 tablet by mouth daily.   ? Multiple Vitamin (MULTIVITAMIN WITH MINERALS) TABS tablet Take 1 tablet by mouth daily.  ? Omega-3 1400 MG CAPS Take by mouth.  ? sacubitril-valsartan (ENTRESTO) 97-103 MG Take 1 tablet by  mouth 2 (two) times daily.  ? Taurine 1000 MG CAPS Take by mouth.  ? vitamin E 180 MG (400 UNITS) capsule Take 400 Units by mouth daily.  ? Zn-Pyg Afri-Nettle-Saw Palmet (SAW PALMETTO COMPLEX PO) Take by mouth.  ? [DISCONTINUED] sacubitril-valsartan (ENTRESTO) 49-51 MG Take 1 tablet by mouth 2 (two) times daily. Please keep upcoming appt with Dr. Acie Fredrickson in April 2023 before anymore refills. Thank you Final attempt  ?  ? ?Allergies:   Patient has no known allergies.  ? ?Social History  ? ?Socioeconomic History  ? Marital status: Married  ?  Spouse name: Not on file  ? Number of children: Not on file  ? Years of education: Not on file  ? Highest education level: Not on file  ?Occupational History  ? Not on file  ?Tobacco Use  ? Smoking status: Never  ? Smokeless tobacco: Never  ?Substance and Sexual Activity  ? Alcohol use: Not Currently  ? Drug use: No  ? Sexual activity: Not on file  ?Other Topics Concern  ? Not on file  ?Social History Narrative  ? Lives in [place].  ? ?Social Determinants of Health  ? ?Financial Resource Strain: Not on file  ?Food Insecurity: Not on file  ?Transportation Needs: Not on file  ?Physical Activity: Not on file  ?Stress: Not on file  ?Social Connections: Not on file  ?  ? ?Family History: ?The patient's family history includes Atrial fibrillation in his brother; Hypertension in his father and sister; Hypertrophic cardiomyopathy in his mother; Stroke in his father. ? ?ROS:   ? ? ?EKGs/Labs/Other Studies Reviewed:   ? ? ?EKG:    12/25/18:   NSR at 30.  No ST or T wave changes.  ? ?Recent Labs: ?03/04/2021: TSH 0.776 ?03/28/2021: ALT 29 ?07/13/2021: BUN 20; Creatinine, Ser 0.92; Hemoglobin 15.3; Platelets 244; Potassium 4.2; Sodium 139  ?Rece

## 2021-12-05 ENCOUNTER — Ambulatory Visit: Payer: BC Managed Care – PPO | Admitting: Cardiovascular Disease

## 2021-12-05 ENCOUNTER — Encounter: Payer: Self-pay | Admitting: Cardiovascular Disease

## 2021-12-05 ENCOUNTER — Ambulatory Visit: Payer: No Typology Code available for payment source | Admitting: Cardiovascular Disease

## 2021-12-05 VITALS — BP 148/90 | HR 67 | Ht 69.0 in | Wt 236.0 lb

## 2021-12-05 DIAGNOSIS — I10 Essential (primary) hypertension: Secondary | ICD-10-CM | POA: Diagnosis not present

## 2021-12-05 DIAGNOSIS — I4819 Other persistent atrial fibrillation: Secondary | ICD-10-CM | POA: Diagnosis not present

## 2021-12-05 DIAGNOSIS — I5042 Chronic combined systolic (congestive) and diastolic (congestive) heart failure: Secondary | ICD-10-CM | POA: Diagnosis not present

## 2021-12-05 DIAGNOSIS — I712 Thoracic aortic aneurysm, without rupture, unspecified: Secondary | ICD-10-CM | POA: Diagnosis not present

## 2021-12-05 MED ORDER — ENTRESTO 97-103 MG PO TABS: 1.0000 | ORAL_TABLET | Freq: Two times a day (BID) | ORAL | 3 refills | Status: DC

## 2021-12-05 NOTE — Patient Instructions (Signed)
Medication Instructions:  ?Your physician has recommended you make the following change in your medication:  ? ?1) INCREASE Entresto 97-'103mg'$  BID ? ?*If you need a refill on your cardiac medications before your next appointment, please call your pharmacy* ? ? ?Lab Work: ?In 3 Weeks: BMP ?If you have labs (blood work) drawn today and your tests are completely normal, you will receive your results only by: ?MyChart Message (if you have MyChart) OR ?A paper copy in the mail ?If you have any lab test that is abnormal or we need to change your treatment, we will call you to review the results. ? ?Testing/Procedures: ?Your physician has ordered a CT chest and aorta to be performed in August 2023. ? ?Follow-Up: ?At [facility], you and your health needs are our priority.  As part of our continuing mission to provide you with exceptional heart care, we have created designated Provider Care Teams.  These Care Teams include your primary Cardiologist (physician) and Advanced Practice Providers (APPs -  Physician Assistants and Nurse Practitioners) who all work together to provide you with the care you need, when you need it. ? ?Your next appointment:   ?1 year(s) ? ?The format for your next appointment:   ?In Person ? ?Provider:   ?[name], MD  or Robbie Lis, PA-C or Richardson Dopp, [place]  ?

## 2021-12-26 ENCOUNTER — Other Ambulatory Visit: Payer: No Typology Code available for payment source | Admitting: *Deleted

## 2021-12-26 DIAGNOSIS — I5042 Chronic combined systolic (congestive) and diastolic (congestive) heart failure: Secondary | ICD-10-CM

## 2021-12-26 DIAGNOSIS — I712 Thoracic aortic aneurysm, without rupture, unspecified: Secondary | ICD-10-CM

## 2021-12-26 DIAGNOSIS — I4819 Other persistent atrial fibrillation: Secondary | ICD-10-CM

## 2021-12-26 DIAGNOSIS — I10 Essential (primary) hypertension: Secondary | ICD-10-CM

## 2021-12-26 LAB — BASIC METABOLIC PANEL
BUN/Creatinine Ratio: 13 (ref 9–20)
BUN: 14 mg/dL (ref 6–24)
CO2: 24 mmol/L (ref 20–29)
Calcium: 10.5 mg/dL — ABNORMAL HIGH (ref 8.7–10.2)
Chloride: 102 mmol/L (ref 96–106)
Creatinine, Ser: 1.1 mg/dL (ref 0.76–1.27)
Glucose: 122 mg/dL — ABNORMAL HIGH (ref 70–99)
Potassium: 4.2 mmol/L (ref 3.5–5.2)
Sodium: 138 mmol/L (ref 134–144)
eGFR: 78 mL/min/{1.73_m2} (ref >59)

## 2021-12-27 ENCOUNTER — Encounter: Payer: Self-pay | Admitting: Family Medicine

## 2021-12-30 ENCOUNTER — Encounter: Payer: Self-pay | Admitting: Cardiovascular Disease

## 2022-01-02 ENCOUNTER — Ambulatory Visit: Payer: BC Managed Care – PPO | Admitting: Cardiovascular Disease

## 2022-01-02 NOTE — Progress Notes (Signed)
? ?PCP:  [name], DO ?Primary Cardiologist: [name], MD ?Electrophysiologist: [name], MD  ? ?Jeffrey Griffin is a 61 y.o. male seen today for [name], MD for routine electrophysiology followup.  Since last being seen in our clinic the patient reports doing OK from a cardiac perspective. He has been recently diagnosed with an aggressive prostate CA.  he denies chest pain, palpitations, dyspnea, PND, orthopnea, nausea, vomiting, dizziness, syncope, edema, weight gain, or early satiety. ? ?Past Medical History:  ?Diagnosis Date  ? Abnormality of thoracic aorta   ? 4.3 CM ECTATIC ASENDING   ? Cardiopathy   ? CHF (congestive heart failure) ([place])   ? SYSTOLIC  ? Hyperlipidemia   ? Hypertension   ? Lung nodule   ? Obesity   ? Persistent atrial fibrillation ([place])   ? WITH RVR  ? Pleural effusion   ? RIGHT UPPER LOBE AND RIGHT LOWER LOBE   ? ?Past Surgical History:  ?Procedure Laterality Date  ? APPENDECTOMY    ? ATRIAL FIBRILLATION ABLATION N/A 07/16/2020  ? Procedure: ATRIAL FIBRILLATION ABLATION;  Surgeon: [name], MD;  Location: [facility];  Service: Cardiovascular;  Laterality: N/A;  ? CARDIOVERSION N/A 10/20/2019  ? Procedure: CARDIOVERSION;  Surgeon: Thayer Headings, MD;  Location: [facility];  Service: Cardiovascular;  Laterality: N/A;  ? CARDIOVERSION N/A 05/31/2020  ? Procedure: CARDIOVERSION;  Surgeon: Josue Hector, MD;  Location: [facility] ENDOSCOPY;  Service: Cardiovascular;  Laterality: N/A;  ? VASECTOMY    ? ? ?Current Outpatient Medications  ?Medication Sig Dispense Refill  ? apixaban (ELIQUIS) 5 MG TABS tablet TAKE 1 TABLET BY MOUTH TWICE A DAY 180 tablet 2  ? Ascorbic Acid (VITAMIN C) POWD Take by mouth.    ? atorvastatin (LIPITOR) 40 MG tablet TAKE 1 TABLET BY MOUTH EVERY DAY 90 tablet 2  ? Carnitine-B5-B6 500-15-5 MG TABS Take by mouth.    ? Cholecalciferol (VITAMIN D3) 25 MCG (1000 UT) CAPS Take 1,000 Units by mouth daily.     ? Citrulline POWD Take by mouth.    ?  Coenzyme Q10 300 MG CAPS Take by mouth daily.    ? CREATINE MONOHYDRATE PO Take by mouth.    ? Cyanocobalamin (B-12) 5000 MCG CAPS Take by mouth.    ? D-Ribose POWD by Does not apply route.    ? diclofenac Sodium (VOLTAREN) 1 % GEL Apply 4 g topically 4 (four) times daily. 350 g 0  ? Garlic 3818 MG CAPS Take by mouth.    ? Glucosamine-Chondroitin (COSAMIN DS PO) Take 2 tablets by mouth daily.    ? Hawthorn 150 MG CAPS Take by mouth daily at 6 (six) AM.    ? meclizine (ANTIVERT) 25 MG tablet TAKE 1 TABLET BY MOUTH 3 TIMES DAILY AS NEEDED FOR DIZZINESS. 90 tablet 1  ? metoprolol succinate (TOPROL-XL) 50 MG 24 hr tablet TAKE 1 TABLET BY MOUTH DAILY. TAKE WITH OR IMMEDIATELY FOLLOWING A MEAL. 90 tablet 3  ? Multiple Minerals-Vitamins (CAL MAG ZINC +D3 PO) Take 1 tablet by mouth daily.     ? Multiple Vitamin (MULTIVITAMIN WITH MINERALS) TABS tablet Take 1 tablet by mouth daily.    ? Omega-3 1400 MG CAPS Take by mouth.    ? sacubitril-valsartan (ENTRESTO) 97-103 MG Take 1 tablet by mouth 2 (two) times daily. 180 tablet 3  ? Taurine 1000 MG CAPS Take by mouth.    ? vitamin E 180 MG (400 UNITS) capsule Take 400 Units by mouth  daily.    ? Zn-Pyg Afri-Nettle-Saw Palmet (SAW PALMETTO COMPLEX PO) Take by mouth.    ? ?No current facility-administered medications for this visit.  ? ? ?No Known Allergies ? ?Social History  ? ?Socioeconomic History  ? Marital status: Married  ?  Spouse name: Not on file  ? Number of children: Not on file  ? Years of education: Not on file  ? Highest education level: Not on file  ?Occupational History  ? Not on file  ?Tobacco Use  ? Smoking status: Never  ? Smokeless tobacco: Never  ?Substance and Sexual Activity  ? Alcohol use: Not Currently  ? Drug use: No  ? Sexual activity: Not on file  ?Other Topics Concern  ? Not on file  ?Social History Narrative  ? Lives in [place].  ? ?Social Determinants of Health  ? ?Financial Resource Strain: Not on file  ?Food Insecurity: Not on file   ?Transportation Needs: Not on file  ?Physical Activity: Not on file  ?Stress: Not on file  ?Social Connections: Not on file  ?Intimate Partner Violence: Not on file  ? ? ? ?Review of Systems: ?All other systems reviewed and are otherwise negative except as noted above. ? ?Physical Exam: ?Vitals:  ? 01/09/22 0802  ?BP: (!) 146/79  ?Pulse: 70  ?SpO2: 95%  ?Weight: 237 lb (107.5 kg)  ?Height: '5\' 9"'$  (1.753 m)  ? ? ?GEN- The patient is well appearing, alert and oriented x 3 today.   ?HEENT: normocephalic, atraumatic; sclera clear, conjunctiva pink; hearing intact; oropharynx clear; neck supple, no JVP ?Lymph- no cervical lymphadenopathy ?Lungs- Clear to ausculation bilaterally, normal work of breathing.  No wheezes, rales, rhonchi ?Heart- Regular rate and rhythm, no murmurs, rubs or gallops, PMI not laterally displaced ?GI- soft, non-tender, non-distended, bowel sounds present, no hepatosplenomegaly ?Extremities- no clubbing, cyanosis, or edema; DP/PT/radial pulses 2+ bilaterally ?MS- no significant deformity or atrophy ?Skin- warm and dry, no rash or lesion ?Psych- euthymic mood, full affect ?Neuro- strength and sensation are intact ? ?EKG is not ordered. Personal review of EKG from  12/05/2021  shows NSR at 61 bpm ? ?Additional studies reviewed include: ?Previous EP office notes.  ? ?Assessment and Plan: ? ?1. Persistent atrial fibrillation and atrial flutter s/p ablation ?Doing well overall.  ?Asymptomatic on Toprol 50 mg daily ?Continue eliquis for CHA2DS2VASC of at least 3.   ?Labs stable 12/26/2021 ?CHA2DS2/VASc is at least 4. We would not recommend stopping [place] without monitoring. Pt is not sure he needs to consider this. He is going to speak with his Oncologist and get his thoughts as well. He may be better served ON [place] if his prostrate cancer is felt to be hypercoagulable.  ?  ?2. Severe OSA ?Encouraged nightly BiPAP.  ?  ?3. Tachycardia mediated CM ?Echo 10/22/20 shows normalization of EF s/p AF ablation and  maintenance of NSR ?Continue Entresto and Toprol ?  ?4. HTN ?Stable on current regimen  ?  ?5. Morbid obesity ?Body mass index is 35 kg/m?.  ?Lifestyle management has been encouraged ?  ?6. Thoracic aortic aneurysm  ?42 mm by most echo 10/2020. Plan CTA in august ?Monitoring per primary cards.  ? ?Follow up with Dr. Curt Bears in 6 months to establish. Sooner if he decides to consider loop/stopping [place].  ? ?Shirley Friar, PA-C  ?01/09/22 ?8:13 AM  ?

## 2022-01-09 ENCOUNTER — Ambulatory Visit (INDEPENDENT_AMBULATORY_CARE_PROVIDER_SITE_OTHER): Payer: No Typology Code available for payment source | Admitting: Student

## 2022-01-09 ENCOUNTER — Encounter: Payer: Self-pay | Admitting: Student

## 2022-01-09 VITALS — BP 146/79 | HR 70 | Ht 69.0 in | Wt 237.0 lb

## 2022-01-09 DIAGNOSIS — I10 Essential (primary) hypertension: Secondary | ICD-10-CM

## 2022-01-09 DIAGNOSIS — I4819 Other persistent atrial fibrillation: Secondary | ICD-10-CM

## 2022-01-09 DIAGNOSIS — I428 Other cardiomyopathies: Secondary | ICD-10-CM

## 2022-01-09 DIAGNOSIS — I5042 Chronic combined systolic (congestive) and diastolic (congestive) heart failure: Secondary | ICD-10-CM | POA: Diagnosis not present

## 2022-01-09 NOTE — Patient Instructions (Signed)
Medication Instructions:  ?Your physician recommends that you continue on your current medications as directed. Please refer to the Current Medication list given to you today. ? ?*If you need a refill on your cardiac medications before your next appointment, please call your pharmacy* ? ? ?Lab Work: ?None  ?If you have labs (blood work) drawn today and your tests are completely normal, you will receive your results only by: ?MyChart Message (if you have MyChart) OR ?A paper copy in the mail ?If you have any lab test that is abnormal or we need to change your treatment, we will call you to review the results. ? ? ?Follow-Up: ?At [facility], you and your health needs are our priority.  As part of our continuing mission to provide you with exceptional heart care, we have created designated Provider Care Teams.  These Care Teams include your primary Cardiologist (physician) and Advanced Practice Providers (APPs -  Physician Assistants and Nurse Practitioners) who all work together to provide you with the care you need, when you need it. ? ? ?Your next appointment:   ?6 month(s) ? ?The format for your next appointment:   ?In Person ? ?Provider:   ?Allegra Lai, MD  ? ? ?Other Instructions ?If you would like to consider stopping your Eliquis and having a Loop Recorder implanted, please let us know before your next appointment. ?  ?

## 2022-01-16 ENCOUNTER — Ambulatory Visit (INDEPENDENT_AMBULATORY_CARE_PROVIDER_SITE_OTHER): Payer: No Typology Code available for payment source | Admitting: Family Medicine

## 2022-01-16 ENCOUNTER — Encounter: Payer: Self-pay | Admitting: Family Medicine

## 2022-01-16 VITALS — BP 137/86 | HR 70 | Ht 69.0 in | Wt 233.2 lb

## 2022-01-16 DIAGNOSIS — R739 Hyperglycemia, unspecified: Secondary | ICD-10-CM | POA: Diagnosis not present

## 2022-01-16 LAB — POCT GLYCOSYLATED HEMOGLOBIN (HGB A1C): Hemoglobin A1C: 5.4 % (ref 4.0–5.6)

## 2022-01-16 NOTE — Progress Notes (Signed)
? ? ?  SUBJECTIVE:  ? ?CHIEF COMPLAINT / HPI:  ? ?Patient presents to the clinic for lab abnormalities that were noted by his primary cardiologist.  Cardiologist did note hyperglycemia and hypercalcemia on his labs.  These have been present previously, hyperglycemia is noted after the patient had eaten.  Patient is working towards progressive weight loss and has a Clinical research associate.  He is using several multivitamins, some of which do include calcium but will need further review. ? ?Patient recently diagnosed with prostatic cancer and is undergoing bone scans on 5/16 to further characterize if any skeletal involvement or concern for metastatic processes outside of the prostate ? ?PERTINENT  PMH / PSH: Afib, HTN, Prostatic cancer ? ?OBJECTIVE:  ? ?BP 137/86   Pulse 70   Ht '5\' 9"'$  (1.753 m)   Wt 233 lb 4 oz (105.8 kg)   SpO2 94%   BMI 34.45 kg/m?   ?Gen: well-appearing, NAD ?CV: RRR, no m/r/g appreciated, no peripheral edema ?Pulm: CTAB, no wheezes/crackles ? ?ASSESSMENT/PLAN:  ? ?Hyperglycemia ?Due to concerns for hypoglycemia, A1c was checked today which was appropriate at 5.4.  Anticipate that previous elevations were secondary to recent food intake. ?- No need for further monitoring unless concerns for diabetic symptoms next ? ?Hypercalcemia ?Calcium has been mildly elevated over the last few checks, in the last year he has had a normal measurement.  Most recently was 10.5.  Patient is on multiple multivitamins, unclear if they could be contributing.  Patient also recently diagnosed with prostatic cancer, which could contribute.  Does not have any known chronic kidney disease and do not feel need for evaluation at this time. ?- Follow-up BMP in the next several months ?- Follow-up prostatic cancer evaluation ? ?Maude Hettich, DO ?[place]  ?

## 2022-01-16 NOTE — Patient Instructions (Addendum)
It was so great seeing you today! Today we discussed the following: ? ?- We are checking your A1c, the results should come back pretty quickly and we will discuss later today or tomorrow if there is anything concerning. ? ?- I do not think we need to do anything further right now with your calcium, but I will look through all of the supplements and see if any are affecting it.  ? ?- Keep an eye on your blood pressure to see what your measurements are at home.  ? ? ?Please make sure to bring any medications you take to your appointments. If you have any questions or concerns please call the office at [phone].  ? ?If any tests were collected today, I will notify you of results via MyChart, phone call, or letter. Please contact the office if you have not heard back about results within 2 weeks.  ? ?

## 2022-01-26 ENCOUNTER — Encounter: Payer: Self-pay | Admitting: Internal Medicine

## 2022-01-26 ENCOUNTER — Encounter: Payer: Self-pay | Admitting: Cardiovascular Disease

## 2022-01-27 NOTE — Telephone Encounter (Signed)
Per [facility] CT results:  Cardiovascular: Ascending thoracic aortic aneurysm measures 4.1 cm  previously 4 cm. No central pulmonary embolus on this nondedicated  study. Normal size heart. No significant pericardial  effusion/thickening.    Will route to MD to see if we can cancel the scheduled CT in August for this patient

## 2022-01-27 NOTE — Telephone Encounter (Signed)
Fay Records, MD  You 28 minutes ago (10:09 AM)   Minimally dilated   OK to cancel the scheduled CT    CT cancelled at this time.

## 2022-02-07 ENCOUNTER — Encounter: Payer: Self-pay | Admitting: *Deleted

## 2022-02-08 ENCOUNTER — Other Ambulatory Visit: Payer: Self-pay | Admitting: Cardiovascular Disease

## 2022-02-08 ENCOUNTER — Encounter: Payer: Self-pay | Admitting: Cardiovascular Disease

## 2022-02-08 NOTE — Telephone Encounter (Signed)
Per OV note on 12/05/21: 4.  Hypertension:   mildly elevated.  Increase Entresto to 97 -  103 BID  BMP in 3 weeks   Rx was sent in for 1 year supply at that time. Advised pt that he should transfer RX to another pharmacy that has in stock to pick up, then he can [facility] and have it placed on automatic refill. We do not have samples to dispense as this is not a new start medication for patient (per Carter Kitten).

## 2022-02-27 ENCOUNTER — Encounter: Payer: Self-pay | Admitting: Family Medicine

## 2022-03-08 ENCOUNTER — Inpatient Hospital Stay (HOSPITAL_COMMUNITY)
Admission: EM | Admit: 2022-03-08 | Discharge: 2022-03-14 | DRG: 163 | Disposition: A | Discharge status: Home / Self Care | Payer: No Typology Code available for payment source | Attending: Family Medicine | Admitting: Family Medicine

## 2022-03-08 ENCOUNTER — Emergency Department (HOSPITAL_COMMUNITY): Payer: No Typology Code available for payment source

## 2022-03-08 ENCOUNTER — Encounter (HOSPITAL_COMMUNITY): Payer: Self-pay | Admitting: Emergency Medicine

## 2022-03-08 ENCOUNTER — Other Ambulatory Visit: Payer: Self-pay

## 2022-03-08 DIAGNOSIS — I2721 Secondary pulmonary arterial hypertension: Secondary | ICD-10-CM | POA: Diagnosis present

## 2022-03-08 DIAGNOSIS — I4819 Other persistent atrial fibrillation: Secondary | ICD-10-CM | POA: Diagnosis present

## 2022-03-08 DIAGNOSIS — Z823 Family history of stroke: Secondary | ICD-10-CM

## 2022-03-08 DIAGNOSIS — Z86711 Personal history of pulmonary embolism: Secondary | ICD-10-CM

## 2022-03-08 DIAGNOSIS — R0602 Shortness of breath: Secondary | ICD-10-CM | POA: Diagnosis not present

## 2022-03-08 DIAGNOSIS — Z8546 Personal history of malignant neoplasm of prostate: Secondary | ICD-10-CM

## 2022-03-08 DIAGNOSIS — N390 Urinary tract infection, site not specified: Secondary | ICD-10-CM | POA: Diagnosis not present

## 2022-03-08 DIAGNOSIS — J9811 Atelectasis: Secondary | ICD-10-CM | POA: Diagnosis not present

## 2022-03-08 DIAGNOSIS — M25561 Pain in right knee: Secondary | ICD-10-CM

## 2022-03-08 DIAGNOSIS — Z9079 Acquired absence of other genital organ(s): Secondary | ICD-10-CM

## 2022-03-08 DIAGNOSIS — E669 Obesity, unspecified: Secondary | ICD-10-CM | POA: Diagnosis present

## 2022-03-08 DIAGNOSIS — Z79899 Other long term (current) drug therapy: Secondary | ICD-10-CM

## 2022-03-08 DIAGNOSIS — I2602 Saddle embolus of pulmonary artery with acute cor pulmonale: Principal | ICD-10-CM | POA: Diagnosis present

## 2022-03-08 DIAGNOSIS — Z6834 Body mass index (BMI) 34.0-34.9, adult: Secondary | ICD-10-CM

## 2022-03-08 DIAGNOSIS — Z7901 Long term (current) use of anticoagulants: Secondary | ICD-10-CM

## 2022-03-08 DIAGNOSIS — B961 Klebsiella pneumoniae [K. pneumoniae] as the cause of diseases classified elsewhere: Secondary | ICD-10-CM | POA: Diagnosis not present

## 2022-03-08 DIAGNOSIS — I82443 Acute embolism and thrombosis of tibial vein, bilateral: Secondary | ICD-10-CM | POA: Diagnosis present

## 2022-03-08 DIAGNOSIS — Z8249 Family history of ischemic heart disease and other diseases of the circulatory system: Secondary | ICD-10-CM

## 2022-03-08 DIAGNOSIS — R509 Fever, unspecified: Secondary | ICD-10-CM | POA: Diagnosis not present

## 2022-03-08 DIAGNOSIS — I5042 Chronic combined systolic (congestive) and diastolic (congestive) heart failure: Secondary | ICD-10-CM | POA: Diagnosis present

## 2022-03-08 DIAGNOSIS — I110 Hypertensive heart disease with heart failure: Secondary | ICD-10-CM | POA: Diagnosis present

## 2022-03-08 DIAGNOSIS — E785 Hyperlipidemia, unspecified: Secondary | ICD-10-CM | POA: Diagnosis present

## 2022-03-08 DIAGNOSIS — M25562 Pain in left knee: Secondary | ICD-10-CM | POA: Diagnosis not present

## 2022-03-08 DIAGNOSIS — J9601 Acute respiratory failure with hypoxia: Secondary | ICD-10-CM | POA: Diagnosis present

## 2022-03-08 DIAGNOSIS — G4733 Obstructive sleep apnea (adult) (pediatric): Secondary | ICD-10-CM | POA: Diagnosis present

## 2022-03-08 DIAGNOSIS — I2699 Other pulmonary embolism without acute cor pulmonale: Secondary | ICD-10-CM | POA: Diagnosis present

## 2022-03-08 DIAGNOSIS — R040 Epistaxis: Secondary | ICD-10-CM | POA: Diagnosis not present

## 2022-03-08 HISTORY — DX: Obstructive sleep apnea (adult) (pediatric): G47.33

## 2022-03-08 LAB — TROPONIN I (HIGH SENSITIVITY)
Troponin I (High Sensitivity): 11 ng/L (ref <18)
Troponin I (High Sensitivity): 46 ng/L — ABNORMAL HIGH (ref <18)

## 2022-03-08 LAB — URINALYSIS, ROUTINE W REFLEX MICROSCOPIC
Bilirubin Urine: NEGATIVE
Glucose, UA: NEGATIVE mg/dL
Ketones, ur: NEGATIVE mg/dL
Nitrite: NEGATIVE
Protein, ur: 100 mg/dL — AB
RBC / HPF: >50 RBC/hpf — ABNORMAL HIGH (ref 0–5)
Specific Gravity, Urine: 1.027 (ref 1.005–1.030)
pH: 5 (ref 5.0–8.0)

## 2022-03-08 LAB — BASIC METABOLIC PANEL
Anion gap: 12 (ref 5–15)
BUN: 20 mg/dL (ref 6–20)
CO2: 22 mmol/L (ref 22–32)
Calcium: 9.8 mg/dL (ref 8.9–10.3)
Chloride: 103 mmol/L (ref 98–111)
Creatinine, Ser: 0.84 mg/dL (ref 0.61–1.24)
GFR, Estimated: >60 mL/min (ref >60)
Glucose, Bld: 115 mg/dL — ABNORMAL HIGH (ref 70–99)
Potassium: 4.2 mmol/L (ref 3.5–5.1)
Sodium: 137 mmol/L (ref 135–145)

## 2022-03-08 LAB — CBC WITH DIFFERENTIAL/PLATELET
Abs Immature Granulocytes: 0.06 10*3/uL (ref 0.00–0.07)
Basophils Absolute: 0.1 10*3/uL (ref 0.0–0.1)
Basophils Relative: 0 %
Eosinophils Absolute: 0.4 10*3/uL (ref 0.0–0.5)
Eosinophils Relative: 3 %
HCT: 43.5 % (ref 39.0–52.0)
Hemoglobin: 13.7 g/dL (ref 13.0–17.0)
Immature Granulocytes: 0 %
Lymphocytes Relative: 10 %
Lymphs Abs: 1.3 10*3/uL (ref 0.7–4.0)
MCH: 29.5 pg (ref 26.0–34.0)
MCHC: 31.5 g/dL (ref 30.0–36.0)
MCV: 93.8 fL (ref 80.0–100.0)
Monocytes Absolute: 1.4 10*3/uL — ABNORMAL HIGH (ref 0.1–1.0)
Monocytes Relative: 10 %
Neutro Abs: 10.8 10*3/uL — ABNORMAL HIGH (ref 1.7–7.7)
Neutrophils Relative %: 77 %
Platelets: 204 10*3/uL (ref 150–400)
RBC: 4.64 MIL/uL (ref 4.22–5.81)
RDW: 12.1 % (ref 11.5–15.5)
WBC: 14 10*3/uL — ABNORMAL HIGH (ref 4.0–10.5)
nRBC: 0 % (ref 0.0–0.2)

## 2022-03-08 LAB — HEPATIC FUNCTION PANEL
ALT: 30 U/L (ref 0–44)
AST: 26 U/L (ref 15–41)
Albumin: 2.9 g/dL — ABNORMAL LOW (ref 3.5–5.0)
Alkaline Phosphatase: 76 U/L (ref 38–126)
Bilirubin, Direct: 0.3 mg/dL — ABNORMAL HIGH (ref 0.0–0.2)
Indirect Bilirubin: 0.6 mg/dL (ref 0.3–0.9)
Total Bilirubin: 0.9 mg/dL (ref 0.3–1.2)
Total Protein: 6.5 g/dL (ref 6.5–8.1)

## 2022-03-08 MED ORDER — MORPHINE SULFATE (PF) 4 MG/ML IV SOLN
4.0000 mg | Freq: Once | INTRAVENOUS | Status: AC
  Administered 2022-03-08: 4 mg via INTRAVENOUS
  Filled 2022-03-08: qty 1

## 2022-03-08 MED ORDER — HEPARIN (PORCINE) 25000 UT/250ML-% IV SOLN
2000.0000 [IU]/h | INTRAVENOUS | Status: AC
  Administered 2022-03-08: 1600 [IU]/h via INTRAVENOUS
  Administered 2022-03-09 (×2): 1800 [IU]/h via INTRAVENOUS
  Filled 2022-03-08 (×2): qty 250

## 2022-03-08 MED ORDER — IOHEXOL 350 MG/ML SOLN
80.0000 mL | Freq: Once | INTRAVENOUS | Status: AC | PRN
  Administered 2022-03-08: 80 mL via INTRAVENOUS

## 2022-03-08 MED ORDER — HEPARIN BOLUS VIA INFUSION
6000.0000 [IU] | Freq: Once | INTRAVENOUS | Status: AC
  Administered 2022-03-08: 6000 [IU] via INTRAVENOUS
  Filled 2022-03-08: qty 6000

## 2022-03-08 MED ORDER — ONDANSETRON HCL 4 MG/2ML IJ SOLN
4.0000 mg | Freq: Once | INTRAMUSCULAR | Status: AC
  Administered 2022-03-08: 4 mg via INTRAVENOUS
  Filled 2022-03-08: qty 2

## 2022-03-08 MED ORDER — OXYBUTYNIN CHLORIDE 5 MG PO TABS
5.0000 mg | ORAL_TABLET | Freq: Three times a day (TID) | ORAL | Status: DC
  Administered 2022-03-09 – 2022-03-14 (×17): 5 mg via ORAL
  Filled 2022-03-08 (×18): qty 1

## 2022-03-08 NOTE — Consult Note (Addendum)
NAME:  Jeffrey Griffin, MRN:  [id number], DOB:  [birth year], LOS: 0 ADMISSION DATE:  03/08/2022, CONSULTATION DATE: 03/08/2022 REFERRING MD: Dr. Gilford Raid, CHIEF COMPLAINT: Pulmonary embolism  History of Present Illness:  Patient presented with chest discomfort, shortness of breath Noted to use oxygen levels were in the 80s at home when he checked his oxygen  Was feeling short of breath with chest discomfort  Recently had prostatectomy 03/01/2022 Off Eliquis, he is to be off Eliquis until his catheter comes out on the 10th  He feels a little bit more comfortable at present  Pertinent  Medical History   Past Medical History:  Diagnosis Date   Abnormality of thoracic aorta    4.3 CM ECTATIC ASENDING    Cardiopathy    CHF (congestive heart failure) ([facility])    SYSTOLIC   Hyperlipidemia    Hypertension    Lung nodule    Obesity    Persistent atrial fibrillation ([facility])    WITH RVR   Pleural effusion    RIGHT UPPER LOBE AND RIGHT LOWER LOBE      Significant Hospital Events: Including procedures, antibiotic start and stop dates in addition to other pertinent events   CT chest-saddle embolus with evidence of right heart strain  Interim History / Subjective:  Chest discomfort, shortness of breath   Objective   Blood pressure 136/77, pulse 84, temperature 98.4 F (36.9 C), temperature source Oral, resp. rate (!) 23, height '5\' 9"'$  (1.753 m), weight 104.3 kg, SpO2 100 %.       No intake or output data in the 24 hours ending 03/08/22 2358 Filed Weights   03/08/22 1914  Weight: 104.3 kg    Examination: General: Middle-age gentleman, does not appear in acute distress HENT: Moist oral mucosa Lungs: Good air entry bilaterally Cardiovascular: S1-S2 appreciated Abdomen: Soft, bowel sounds appreciated Extremities: No clubbing, no edema Neuro: Alert and oriented x3 GU: [facility] Problem list     Assessment & Plan:  Acute pulmonary embolism Saddle embolus   hypoxemic respiratory failure Recent prostatectomy History of atrial fibrillation History of heart failure -Systolic and diastolic heart failure OSA  - on  Bipap 22/18 - may use own device or hospital device  Patient may be admitted to hospitalist service on anticoagulation  I did contact interventional radiology for possible IR intervention Patient is not a good candidate for lytic therapy because of the risk of bleeding with recent prostatectomy however may be a candidate for thrombectomy -This was discussed with patient -He will want to discuss this with his family prior to considering this as an option of treatment for himself  -Wean down oxygen supplementation as tolerated -Pain management -Tolerating 4 L of oxygen  We will continue to follow  Patient may be admitted to the hospitalist service  Obtain echocardiogram Vascular ultrasound of the lower extremity  IR will follow in AM  Best Practice (right click and "Reselect all SmartList Selections" daily)   Diet/type: Regular consistency (see orders) DVT prophylaxis: systemic heparin GI prophylaxis: N/A Lines: N/A Foley:  N/A Code Status:  full code Last date of multidisciplinary goals of care discussion [pending]  Labs   CBC: Recent Labs  Lab 03/08/22 1932  WBC 14.0*  NEUTROABS 10.8*  HGB 13.7  HCT 43.5  MCV 93.8  PLT 174    Basic Metabolic Panel: Recent Labs  Lab 03/08/22 1932  NA 137  K 4.2  CL 103  CO2 22  GLUCOSE 115*  BUN 20  CREATININE 0.84  CALCIUM 9.8   GFR: Estimated Creatinine Clearance: 114 mL/min (by C-G formula based on SCr of 0.84 mg/dL). Recent Labs  Lab 03/08/22 1932  WBC 14.0*    Liver Function Tests: Recent Labs  Lab 03/08/22 1932  AST 26  ALT 30  ALKPHOS 76  BILITOT 0.9  PROT 6.5  ALBUMIN 2.9*   No results for input(s): "LIPASE", "AMYLASE" in the last 168 hours. No results for input(s): "AMMONIA" in the last 168 hours.  ABG    Component Value Date/Time    TCO2 27 05/31/2020 0830     Coagulation Profile: No results for input(s): "INR", "PROTIME" in the last 168 hours.  Cardiac Enzymes: No results for input(s): "CKTOTAL", "CKMB", "CKMBINDEX", "TROPONINI" in the last 168 hours.  HbA1C: Hemoglobin A1C  Date/Time Value Ref Range Status  01/16/2022 12:00 AM 5.4 4.0 - 5.6 % Final    CBG: No results for input(s): "GLUCAP" in the last 168 hours.  Review of Systems:   Shortness of breath, chest discomfort  Past Medical History:  He,  has a past medical history of Abnormality of thoracic aorta, Cardiopathy, CHF (congestive heart failure) ([place]), Hyperlipidemia, Hypertension, Lung nodule, Obesity, Persistent atrial fibrillation ([place]), and Pleural effusion.   Surgical History:   Past Surgical History:  Procedure Laterality Date   APPENDECTOMY     ATRIAL FIBRILLATION ABLATION N/A 07/16/2020   Procedure: ATRIAL FIBRILLATION ABLATION;  Surgeon: [name], MD;  Location: [facility];  Service: Cardiovascular;  Laterality: N/A;   CARDIOVERSION N/A 10/20/2019   Procedure: CARDIOVERSION;  Surgeon: Acie Fredrickson Wonda Cheng, MD;  Location: [facility] ENDOSCOPY;  Service: Cardiovascular;  Laterality: N/A;   CARDIOVERSION N/A 05/31/2020   Procedure: CARDIOVERSION;  Surgeon: Josue Hector, MD;  Location: [facility] ENDOSCOPY;  Service: Cardiovascular;  Laterality: N/A;   VASECTOMY       Social History:   reports that he has never smoked. He has never used smokeless tobacco. He reports that he does not currently use alcohol. He reports that he does not use drugs.   Family History:  His family history includes Atrial fibrillation in his brother; Hypertension in his father and sister; Hypertrophic cardiomyopathy in his mother; Stroke in his father.   Allergies No Known Allergies   Home Medications  Prior to Admission medications   Medication Sig Start Date End Date Taking? Authorizing Provider  acetaminophen (TYLENOL) 500 MG tablet Take 500 mg by mouth in the  morning, at noon, and at bedtime.   Yes [provider]  apixaban (ELIQUIS) 5 MG TABS tablet TAKE 1 TABLET BY MOUTH TWICE A DAY Patient taking differently: Take 5 mg by mouth 2 (two) times daily. 08/15/21  Yes Allred, Jeneen Rinks, MD  atorvastatin (LIPITOR) 40 MG tablet TAKE 1 TABLET BY MOUTH EVERY DAY Patient taking differently: Take 40 mg by mouth daily. 11/29/21  Yes Nahser, Wonda Cheng, MD  Carnitine-B5-B6 500-15-5 MG TABS Take 1 tablet by mouth daily.   Yes [provider]  Cholecalciferol (VITAMIN D3) 25 MCG (1000 UT) CAPS Take 1,000 Units by mouth daily.    Yes [provider]  Coenzyme Q10 300 MG CAPS Take 1 capsule by mouth daily.   Yes [provider]  Cyanocobalamin (B-12) 5000 MCG CAPS Take 1 capsule by mouth daily.   Yes [provider]  diclofenac Sodium (VOLTAREN) 1 % GEL Apply 4 g topically 4 (four) times daily. Patient taking differently: Apply 4 g topically daily as needed (For pain). 01/28/21  Yes  Lilland, Alana, DO  Garlic 1093 MG CAPS Take 1 capsule by mouth daily.   Yes [provider]  Glucosamine-Chondroitin (COSAMIN DS PO) Take 2 tablets by mouth daily.   Yes [provider]  Hawthorn 150 MG CAPS Take 1 capsule by mouth daily at 6 (six) AM.   Yes [provider]  ibuprofen (ADVIL) 200 MG tablet Take 200 mg by mouth every 6 (six) hours as needed for mild pain.   Yes [provider]  meclizine (ANTIVERT) 25 MG tablet TAKE 1 TABLET BY MOUTH 3 TIMES DAILY AS NEEDED FOR DIZZINESS. Patient taking differently: Take 25 mg by mouth 3 (three) times daily as needed for dizziness. 09/07/21  Yes Lilland, Alana, DO  metoprolol succinate (TOPROL-XL) 50 MG 24 hr tablet TAKE 1 TABLET BY MOUTH DAILY. TAKE WITH OR IMMEDIATELY FOLLOWING A MEAL. Patient taking differently: Take 50 mg by mouth daily. 10/06/21  Yes Nahser, Wonda Cheng, MD  Multiple Minerals-Vitamins (CAL MAG ZINC +D3 PO) Take 1 tablet by mouth daily.    Yes [provider]  Multiple Vitamin (MULTIVITAMIN WITH MINERALS) TABS tablet Take 1 tablet by mouth daily.   Yes [provider]  Omega-3 1400 MG CAPS Take 1 capsule by mouth daily.   Yes [provider]  oxybutynin (DITROPAN) 5 MG tablet Take 5 mg by mouth 3 (three) times daily. 03/06/22  Yes [provider]  sacubitril-valsartan (ENTRESTO) 97-103 MG Take 1 tablet by mouth 2 (two) times daily. 12/05/21  Yes Nahser, Wonda Cheng, MD  Taurine 1000 MG CAPS Take 1 capsule by mouth daily.   Yes [provider]  vitamin E 180 MG (400 UNITS) capsule Take 400 Units by mouth daily.   Yes [provider]  Zn-Pyg Afri-Nettle-Saw Palmet (SAW PALMETTO COMPLEX PO) Take 1 tablet by mouth daily.   Yes [provider]    The patient is critically ill with multiple organ systems failure and requires high complexity decision making for assessment and support, frequent evaluation and titration of therapies, application of advanced monitoring technologies and extensive interpretation of multiple databases. Critical Care Time devoted to patient care services described in this note independent of APP/resident time (if applicable)  is 32 minutes.   Sherrilyn Rist MD [facility] Personal pager: See Amion If unanswered, please page CCM On-call: [phone]

## 2022-03-08 NOTE — ED Provider Triage Note (Signed)
Emergency Medicine Provider Triage Evaluation Note  Jeffrey Griffin , a 61 y.o. male  was evaluated in triage.  Pt complains of shortness of breath, back pain.  Has been feeling short of breath for the past few weeks ever since his prostate surgery.  He was told to discontinue his anticoagulation due to surgery and has not been back on it so far.  Having left-sided back pain that started 2 hours ago.  States that this feels similar to when he was diagnosed with his PE about 2 years ago.  Denies any leg swelling.  Review of Systems  Positive: Shortness of breath, back pain Negative: Chest pain  Physical Exam  BP (!) 159/75   Pulse 83   Temp 98.4 F (36.9 C) (Oral)   Resp 20   Ht '5\' 9"'$  (1.753 m)   Wt 104.3 kg   SpO2 90%   BMI 33.97 kg/m  Gen:   Awake, no distress   Resp:  Normal effort  MSK:   Moves extremities without difficulty  Other:  Tachypneic  Medical Decision Making  Medically screening exam initiated at 7:28 PM.  Appropriate orders placed.  Jeffrey Griffin was informed that the remainder of the evaluation will be completed by another provider, this initial triage assessment does not replace that evaluation, and the importance of remaining in the ED until their evaluation is complete.  Hypoxic on room air placed on supplemental oxygen.  Will need room soon and work-up for hypoxia   Jeffrey Heady, PA-C 03/08/22 1930

## 2022-03-08 NOTE — ED Provider Notes (Signed)
[facility] EMERGENCY DEPARTMENT Provider Note   CSN: [id number] Arrival date & time: 03/08/22  1847     History  Chief Complaint  Patient presents with   Shortness of Breath   Back Pain    Jeffrey Griffin is a 61 y.o. male.  Pt is a 61 yo male with a pmhx significant for PE, CHF, CM, HLD, afib (on Eliquis), HTN, and prostate cancer.  Pt had a prostatectomy on 6/28 and [place] by Dr. Norma Fredrickson.  He was taken off his Eliquis a week before his surgery and has been off it since the surgery.  He is supposed to stay off it until his foley is removed in about a week.  Pt has noticed some left leg pain and now feels sob.  Pt has some left sided rib pain as well.  O2 sat 84 at home.  Pt is feeling better after 2L oxygen applied.  O2 sats up to the mid-90s.       Home Medications Prior to Admission medications   Medication Sig Start Date End Date Taking? Authorizing Provider  acetaminophen (TYLENOL) 500 MG tablet Take 500 mg by mouth in the morning, at noon, and at bedtime.   Yes [provider]  apixaban (ELIQUIS) 5 MG TABS tablet TAKE 1 TABLET BY MOUTH TWICE A DAY Patient taking differently: Take 5 mg by mouth 2 (two) times daily. 08/15/21  Yes Allred, Jeneen Rinks, MD  atorvastatin (LIPITOR) 40 MG tablet TAKE 1 TABLET BY MOUTH EVERY DAY Patient taking differently: Take 40 mg by mouth daily. 11/29/21  Yes Nahser, Wonda Cheng, MD  Carnitine-B5-B6 500-15-5 MG TABS Take 1 tablet by mouth daily.   Yes [provider]  Cholecalciferol (VITAMIN D3) 25 MCG (1000 UT) CAPS Take 1,000 Units by mouth daily.    Yes [provider]  Coenzyme Q10 300 MG CAPS Take 1 capsule by mouth daily.   Yes [provider]  Cyanocobalamin (B-12) 5000 MCG CAPS Take 1 capsule by mouth daily.   Yes [provider]  diclofenac Sodium (VOLTAREN) 1 % GEL Apply 4 g topically 4 (four) times daily. Patient taking differently: Apply 4 g topically daily as needed (For  pain). 01/28/21  Yes Lilland, Alana, DO  Garlic 2585 MG CAPS Take 1 capsule by mouth daily.   Yes [provider]  Glucosamine-Chondroitin (COSAMIN DS PO) Take 2 tablets by mouth daily.   Yes [provider]  Hawthorn 150 MG CAPS Take 1 capsule by mouth daily at 6 (six) AM.   Yes [provider]  ibuprofen (ADVIL) 200 MG tablet Take 200 mg by mouth every 6 (six) hours as needed for mild pain.   Yes [provider]  meclizine (ANTIVERT) 25 MG tablet TAKE 1 TABLET BY MOUTH 3 TIMES DAILY AS NEEDED FOR DIZZINESS. Patient taking differently: Take 25 mg by mouth 3 (three) times daily as needed for dizziness. 09/07/21  Yes Lilland, Alana, DO  metoprolol succinate (TOPROL-XL) 50 MG 24 hr tablet TAKE 1 TABLET BY MOUTH DAILY. TAKE WITH OR IMMEDIATELY FOLLOWING A MEAL. Patient taking differently: Take 50 mg by mouth daily. 10/06/21  Yes Nahser, Wonda Cheng, MD  Multiple Minerals-Vitamins (CAL MAG ZINC +D3 PO) Take 1 tablet by mouth daily.    Yes [provider]  Multiple Vitamin (MULTIVITAMIN WITH MINERALS) TABS tablet Take 1 tablet by mouth daily.   Yes [provider]  Omega-3 1400 MG CAPS Take 1 capsule by mouth daily.   Yes  [provider]  oxybutynin (DITROPAN) 5 MG tablet Take 5 mg by mouth 3 (three) times daily. 03/06/22  Yes [provider]  sacubitril-valsartan (ENTRESTO) 97-103 MG Take 1 tablet by mouth 2 (two) times daily. 12/05/21  Yes Nahser, Wonda Cheng, MD  Taurine 1000 MG CAPS Take 1 capsule by mouth daily.   Yes [provider]  vitamin E 180 MG (400 UNITS) capsule Take 400 Units by mouth daily.   Yes [provider]  Zn-Pyg Afri-Nettle-Saw Palmet (SAW PALMETTO COMPLEX PO) Take 1 tablet by mouth daily.   Yes [provider]      Allergies    Patient has no known allergies.    Review of Systems   Review of Systems  Respiratory:  Positive for shortness of breath.   All other systems reviewed and are  negative.   Physical Exam Updated Vital Signs BP 128/79   Pulse 84   Temp 98.4 F (36.9 C) (Oral)   Resp (!) 29   Ht '5\' 9"'$  (1.753 m)   Wt 104.3 kg   SpO2 100%   BMI 33.97 kg/m  Physical Exam Vitals and nursing note reviewed.  Constitutional:      Appearance: He is well-developed.  HENT:     Head: Normocephalic and atraumatic.     Mouth/Throat:     Mouth: Mucous membranes are moist.     Pharynx: Oropharynx is clear.  Eyes:     Extraocular Movements: Extraocular movements intact.     Pupils: Pupils are equal, round, and reactive to light.  Cardiovascular:     Rate and Rhythm: Normal rate and regular rhythm.  Pulmonary:     Effort: Pulmonary effort is normal.     Breath sounds: Normal breath sounds.  Abdominal:     General: Bowel sounds are normal.     Palpations: Abdomen is soft.  Musculoskeletal:        General: Normal range of motion.     Cervical back: Normal range of motion and neck supple.  Skin:    General: Skin is warm.     Capillary Refill: Capillary refill takes less than 2 seconds.  Neurological:     General: No focal deficit present.     Mental Status: He is alert and oriented to person, place, and time.  Psychiatric:        Mood and Affect: Mood normal.        Behavior: Behavior normal.     ED Results / Procedures / Treatments   Labs (all labs ordered are listed, but only abnormal results are displayed) Labs Reviewed  BASIC METABOLIC PANEL - Abnormal; Notable for the following components:      Result Value   Glucose, Bld 115 (*)    All other components within normal limits  CBC WITH DIFFERENTIAL/PLATELET - Abnormal; Notable for the following components:   WBC 14.0 (*)    Neutro Abs 10.8 (*)    Monocytes Absolute 1.4 (*)    All other components within normal limits  URINALYSIS, ROUTINE W REFLEX MICROSCOPIC - Abnormal; Notable for the following components:   APPearance HAZY (*)    Hgb urine dipstick LARGE (*)    Protein, ur 100 (*)     Leukocytes,Ua TRACE (*)    RBC / HPF >50 (*)    Bacteria, UA RARE (*)    All other components within normal limits  HEPATIC FUNCTION PANEL - Abnormal; Notable for the following components:   Albumin 2.9 (*)  Bilirubin, Direct 0.3 (*)    All other components within normal limits  TROPONIN I (HIGH SENSITIVITY) - Abnormal; Notable for the following components:   Troponin I (High Sensitivity) 46 (*)    All other components within normal limits  HEPARIN LEVEL (UNFRACTIONATED)  CBC  TROPONIN I (HIGH SENSITIVITY)    EKG EKG Interpretation  Date/Time:  Wednesday March 08 2022 19:12:58 EDT Ventricular Rate:  82 PR Interval:  168 QRS Duration: 108 QT Interval:  364 QTC Calculation: 425 R Axis:   38 Text Interpretation: Normal sinus rhythm Normal ECG When compared with ECG of 28-Mar-2021 19:52, PREVIOUS ECG IS PRESENT No significant change since last tracing Confirmed by Isla Pence [id number]) on 03/08/2022 8:29:07 PM  Radiology CT Angio Chest PE W and/or Wo Contrast  Result Date: 03/08/2022 CLINICAL DATA:  Pulmonary embolism suspected, high probability. Sharp chest pain. EXAM: CT ANGIOGRAPHY CHEST WITH CONTRAST TECHNIQUE: Multidetector CT imaging of the chest was performed using the standard protocol during bolus administration of intravenous contrast. Multiplanar CT image reconstructions and MIPs were obtained to evaluate the vascular anatomy. RADIATION DOSE REDUCTION: This exam was performed according to the departmental dose-optimization program which includes automated exposure control, adjustment of the mA and/or kV according to patient size and/or use of iterative reconstruction technique. CONTRAST:  9m OMNIPAQUE IOHEXOL 350 MG/ML SOLN COMPARISON:  01/17/2022. FINDINGS: Cardiovascular: The heart is borderline enlarged and there is no pericardial effusion. There is aneurysmal dilatation of the ascending aorta measuring 4.1 cm. The pulmonary trunk is distended suggesting underlying  pulmonary artery hypertension. There are extensive bilateral pulmonary emboli with a saddle embolus. Evaluation of the segmental and subsegmental arteries is limited due to suboptimal opacification. The right ventricle is distended suggesting right heart strain. Mediastinum/Nodes: No mediastinal, hilar, or axillary lymphadenopathy. Thyroid gland, trachea, and esophagus are within normal limits. There is a small hiatal hernia. Lungs/Pleura: Strandy atelectasis or infiltrate is noted at the lung bases. There is focal consolidation in the lingular segment of the left upper lobe. Trace bilateral pleural effusions are noted. No pneumothorax. Upper Abdomen: No acute abnormality. Musculoskeletal: Degenerative changes are present in the thoracic spine. No acute osseous abnormality is seen. Review of the MIP images confirms the above findings. IMPRESSION: 1. Extensive bilateral pulmonary emboli with saddle pulmonary embolus. The right ventricle is distended suggesting underlying right heart strain. 2. Distended pulmonary trunk which may be associated with underlying pulmonary artery hypertension. 3. Aneurysmal dilatation of the ascending aorta measuring 4.1 cm. Recommend annual imaging followup by CTA or MRA. This recommendation follows 2010 ACCF/AHA/AATS/ACR/ASA/SCA/SCAI/SIR/STS/SVM Guidelines for the Diagnosis and Management of Patients with Thoracic Aortic Disease. Circulation. 2010; 121:: O878-M767 Aortic aneurysm NOS (ICD10-I71.9) 4. Strandy atelectasis at the lung bases. There is a focal opacity in the lingular segment of the left upper lobe, possible pulmonary infarct versus infectious or inflammatory process. 5. Trace bilateral pleural effusions. Critical findings were reported to Dr. JIsla Penceat 11:07 p.m. Electronically Signed   By: LBrett FairyM.D.   On: 03/08/2022 23:09   DG Chest 2 View  Result Date: 03/08/2022 CLINICAL DATA:  61year old male presenting with shortness of breath. EXAM: CHEST - 2 VIEW  COMPARISON:  September 15, 2019. FINDINGS: Trachea midline. Cardiomediastinal contours and hilar structures are normal. Subtle opacity at the LEFT lung base. No lobar consolidation. No sign of pleural effusion. Opacity just lateral to the costodiaphragmatic sulcus. No pneumothorax. On limited assessment there is no acute skeletal finding. IMPRESSION: Subtle opacity at the LEFT lung base may  represent atelectasis or developing infection. New from previous imaging. Electronically Signed   By: Zetta Bills M.D.   On: 03/08/2022 20:02    Procedures Procedures    Medications Ordered in ED Medications  heparin ADULT infusion 100 units/mL (25000 units/274m) (1,600 Units/hr Intravenous New Bag/Given 03/08/22 2258)  morphine (PF) 4 MG/ML injection 4 mg (4 mg Intravenous Given 03/08/22 2150)  ondansetron (ZOFRAN) injection 4 mg (4 mg Intravenous Given 03/08/22 2148)  iohexol (OMNIPAQUE) 350 MG/ML injection 80 mL (80 mLs Intravenous Contrast Given 03/08/22 2224)  heparin bolus via infusion 6,000 Units (6,000 Units Intravenous Bolus from Bag 03/08/22 2258)    ED Course/ Medical Decision Making/ A&P                           Medical Decision Making Amount and/or Complexity of Data Reviewed Labs: ordered. Radiology: ordered.  Risk Prescription drug management. Decision regarding hospitalization.   This patient presents to the ED for concern of sob, this involves an extensive number of treatment options, and is a complaint that carries with it a high risk of complications and morbidity.  The differential diagnosis includes PE, pna, chf, copd   Co morbidities that complicate the patient evaluation  PE, CHF, CM, HLD, afib (on Eliquis), HTN, and prostate cancer   Additional history obtained:  Additional history obtained from epic chart review External records from outside source obtained and reviewed including wife   Lab Tests:  I Ordered, and personally interpreted labs.  The pertinent results  include:  cbc with wbc elevated at 14, bmp nl, initial trop nl; 2nd trop 46; ua with lg hgb and >50 rbc   Imaging Studies ordered:  I ordered imaging studies including cxr and CT chest  I independently visualized and interpreted imaging which showed  CXR: IMPRESSION:  Subtle opacity at the LEFT lung base may represent atelectasis or  developing infection. New from previous imaging.   I agree with the radiologist interpretation   Cardiac Monitoring:  The patient was maintained on a cardiac monitor.  I personally viewed and interpreted the cardiac monitored which showed an underlying rhythm of: nsr   Medicines ordered and prescription drug management:  I ordered medication including heparin  for pe  Reevaluation of the patient after these medicines showed that the patient improved I have reviewed the patients home medicines and have made adjustments as needed   Test Considered:  ct   Critical Interventions:  heparin   Consultations Obtained:  I requested consultation with the instensivist,  and discussed lab and imaging findings as well as pertinent plan - they will consult   Problem List / ED Course:  Pulmonary Embolism:  heparin.  Bleeding risk due to recent surgery.  Pulm to consult.   Reevaluation:  After the interventions noted above, I reevaluated the patient and found that they have :worsened   Social Determinants of Health:  Lives at home with wife   Dispostion:  After consideration of the diagnostic results and the patients response to treatment, I feel that the patent would benefit from admission.    CRITICAL CARE Performed by: JIsla Pence  Total critical care time: 30 minutes  Critical care time was exclusive of separately billable procedures and treating other patients.  Critical care was necessary to treat or prevent imminent or life-threatening deterioration.  Critical care was time spent personally by me on the following  activities: development of treatment plan with patient and/or surrogate  as well as nursing, discussions with consultants, evaluation of patient's response to treatment, examination of patient, obtaining history from patient or surrogate, ordering and performing treatments and interventions, ordering and review of laboratory studies, ordering and review of radiographic studies, pulse oximetry and re-evaluation of patient's condition.         Final Clinical Impression(s) / ED Diagnoses Final diagnoses:  Acute saddle pulmonary embolism with acute cor pulmonale ([place])    Rx / DC Orders ED Discharge Orders     None         Isla Pence, MD 03/08/22 2324

## 2022-03-08 NOTE — ED Notes (Signed)
EDP notified of trop.  

## 2022-03-08 NOTE — ED Notes (Signed)
Patient transported to CT 

## 2022-03-08 NOTE — ED Notes (Signed)
Pt arrived back from CT and c/o dyspnea. Pt visibly in distress. O2 sat on 6L was 83%. Pt was placed on NRB 10L with O2 sats 100%. EDP at bedside.

## 2022-03-08 NOTE — ED Notes (Signed)
Pt stating he is having pain during inhalation of breathing. Pt's O2 and Resp stable. This RN notified EDP at this time.

## 2022-03-08 NOTE — Progress Notes (Signed)
ANTICOAGULATION CONSULT NOTE - Initial Consult  Pharmacy Consult for heparin Indication: pulmonary embolus  No Known Allergies  Patient Measurements: Height: '5\' 9"'$  (175.3 cm) Weight: 104.3 kg (230 lb) IBW/kg (Calculated) : 70.7 Heparin Dosing Weight: 93.2kg  Vital Signs: Temp: 98.4 F (36.9 C) (07/05 1910) Temp Source: Oral (07/05 1910) BP: 142/72 (07/05 2200) Pulse Rate: 78 (07/05 2200)  Labs: Recent Labs    03/08/22 1932 03/08/22 2139  HGB 13.7  --   HCT 43.5  --   PLT 204  --   CREATININE 0.84  --   TROPONINIHS 11 46*    Estimated Creatinine Clearance: 114 mL/min (by C-G formula based on SCr of 0.84 mg/dL).   Medical History: Past Medical History:  Diagnosis Date   Abnormality of thoracic aorta    4.3 CM ECTATIC ASENDING    Cardiopathy    CHF (congestive heart failure) (HCC)    SYSTOLIC   Hyperlipidemia    Hypertension    Lung nodule    Obesity    Persistent atrial fibrillation (HCC)    WITH RVR   Pleural effusion    RIGHT UPPER LOBE AND RIGHT LOWER LOBE     Assessment: Jeffrey Griffin presenting with SOB, CT angio chest with PE, hx of afib on Eliquis PTA (last dose two weeks ago d/t procedure), CBC wnl  Goal of Therapy:  Heparin level 0.3-0.7 units/ml Monitor platelets by anticoagulation protocol: Yes   Plan:  Heparin 6000 units IV x 1, and gtt at 1550 units/hr F/u 6 hour heparin level F/u long term [facility] plan  Bertis Ruddy, PharmD Clinical Pharmacist ED Pharmacist Phone # [phone] 03/08/2022 10:45 PM

## 2022-03-08 NOTE — ED Triage Notes (Signed)
Pt reports he has "slight shortness of breath", pain in back, "when I breath it hurts."  Denies CP.  Also reports left lower leg pain. Pt has surgery a week ago and has been off his blood thinners for two weeks total.

## 2022-03-08 NOTE — Consult Note (Signed)
Vascular and Interventional Radiology  On Call Brief Consult Note  Patient: Jeffrey Griffin DOB: Oct 12, 1960 Medical Record Number: [id number] Note Date/Time: 03/08/22 11:38 PM   Admitting Diagnosis: possible blood clot, oxygen levels low  1. Acute saddle pulmonary embolism with acute cor pulmonale (HCC)      Subjective:  61 y/o M comorbid w Hx of Afib on Eliquis stopped (not resumed) for recent prostatectomy 1 wk prior, who presented to ER w SOB requiring 10L O2. CTA PE revealing saddle PE. PCCM consulted VIR to discuss potential options for therapy.  Spoke w PCCM, Dr. Ander Slade. He is hesitant to offer lytic therapy given recent surgery.   Imaging:  CTA PE, 03/08/22 Poorly opacified but revealing saddle embolus. RV/LV independently calculated at 0.9     Assessment  Plan: 61 y.o. year old male whom PCCM reached out to [facility] On Call Physician to discuss potential therapy for saddle emboli.  RV/LV ratio 0.9  sPESI score = 2 (High risk given positive troponins and Hx of CA) Bova Score = 2 pts, stage 1 (Low risk, despite clot burden)  Spoke w PCCM, Dr. Ander Slade. He is hesitant to offer lytic therapy given recent surgery.  I offered mechanical thrombectomy, if he and the Pt are amenable. He will meet with the Pt and discuss.  VIR to follow.   Michaelle Birks, MD [facility]ascular [facility] Interventional [facility] Radiology   Pager. [place]

## 2022-03-09 ENCOUNTER — Encounter (HOSPITAL_COMMUNITY): Payer: Self-pay | Admitting: Internal Medicine

## 2022-03-09 ENCOUNTER — Inpatient Hospital Stay (HOSPITAL_COMMUNITY): Payer: No Typology Code available for payment source | Admitting: Certified Registered Nurse Anesthetist

## 2022-03-09 ENCOUNTER — Encounter (HOSPITAL_COMMUNITY)
Admission: EM | Disposition: A | Discharge status: Home / Self Care | Payer: Self-pay | Source: Home / Self Care | Attending: Family Medicine

## 2022-03-09 ENCOUNTER — Inpatient Hospital Stay (HOSPITAL_COMMUNITY): Payer: No Typology Code available for payment source

## 2022-03-09 DIAGNOSIS — I10 Essential (primary) hypertension: Secondary | ICD-10-CM | POA: Diagnosis not present

## 2022-03-09 DIAGNOSIS — I4819 Other persistent atrial fibrillation: Secondary | ICD-10-CM | POA: Diagnosis present

## 2022-03-09 DIAGNOSIS — I2602 Saddle embolus of pulmonary artery with acute cor pulmonale: Secondary | ICD-10-CM | POA: Diagnosis present

## 2022-03-09 DIAGNOSIS — Z823 Family history of stroke: Secondary | ICD-10-CM | POA: Diagnosis not present

## 2022-03-09 DIAGNOSIS — Z9079 Acquired absence of other genital organ(s): Secondary | ICD-10-CM

## 2022-03-09 DIAGNOSIS — I2699 Other pulmonary embolism without acute cor pulmonale: Secondary | ICD-10-CM | POA: Diagnosis not present

## 2022-03-09 DIAGNOSIS — Z9989 Dependence on other enabling machines and devices: Secondary | ICD-10-CM

## 2022-03-09 DIAGNOSIS — I480 Paroxysmal atrial fibrillation: Secondary | ICD-10-CM | POA: Diagnosis not present

## 2022-03-09 DIAGNOSIS — Z86711 Personal history of pulmonary embolism: Secondary | ICD-10-CM | POA: Diagnosis not present

## 2022-03-09 DIAGNOSIS — G4733 Obstructive sleep apnea (adult) (pediatric): Secondary | ICD-10-CM

## 2022-03-09 DIAGNOSIS — Z8249 Family history of ischemic heart disease and other diseases of the circulatory system: Secondary | ICD-10-CM | POA: Diagnosis not present

## 2022-03-09 DIAGNOSIS — J9601 Acute respiratory failure with hypoxia: Secondary | ICD-10-CM | POA: Diagnosis present

## 2022-03-09 DIAGNOSIS — R509 Fever, unspecified: Secondary | ICD-10-CM | POA: Diagnosis not present

## 2022-03-09 DIAGNOSIS — Z79899 Other long term (current) drug therapy: Secondary | ICD-10-CM | POA: Diagnosis not present

## 2022-03-09 DIAGNOSIS — M25561 Pain in right knee: Secondary | ICD-10-CM | POA: Diagnosis not present

## 2022-03-09 DIAGNOSIS — J9811 Atelectasis: Secondary | ICD-10-CM | POA: Diagnosis not present

## 2022-03-09 DIAGNOSIS — Z7901 Long term (current) use of anticoagulants: Secondary | ICD-10-CM | POA: Diagnosis not present

## 2022-03-09 DIAGNOSIS — R0602 Shortness of breath: Secondary | ICD-10-CM | POA: Diagnosis present

## 2022-03-09 DIAGNOSIS — I82443 Acute embolism and thrombosis of tibial vein, bilateral: Secondary | ICD-10-CM | POA: Diagnosis present

## 2022-03-09 DIAGNOSIS — I110 Hypertensive heart disease with heart failure: Secondary | ICD-10-CM | POA: Diagnosis present

## 2022-03-09 DIAGNOSIS — E785 Hyperlipidemia, unspecified: Secondary | ICD-10-CM | POA: Diagnosis present

## 2022-03-09 DIAGNOSIS — B961 Klebsiella pneumoniae [K. pneumoniae] as the cause of diseases classified elsewhere: Secondary | ICD-10-CM | POA: Diagnosis not present

## 2022-03-09 DIAGNOSIS — M25562 Pain in left knee: Secondary | ICD-10-CM | POA: Diagnosis not present

## 2022-03-09 DIAGNOSIS — I2721 Secondary pulmonary arterial hypertension: Secondary | ICD-10-CM | POA: Diagnosis present

## 2022-03-09 DIAGNOSIS — Z8546 Personal history of malignant neoplasm of prostate: Secondary | ICD-10-CM | POA: Diagnosis not present

## 2022-03-09 DIAGNOSIS — I5042 Chronic combined systolic (congestive) and diastolic (congestive) heart failure: Secondary | ICD-10-CM

## 2022-03-09 DIAGNOSIS — N390 Urinary tract infection, site not specified: Secondary | ICD-10-CM | POA: Diagnosis not present

## 2022-03-09 DIAGNOSIS — R040 Epistaxis: Secondary | ICD-10-CM | POA: Diagnosis not present

## 2022-03-09 DIAGNOSIS — E669 Obesity, unspecified: Secondary | ICD-10-CM | POA: Diagnosis present

## 2022-03-09 DIAGNOSIS — Z6834 Body mass index (BMI) 34.0-34.9, adult: Secondary | ICD-10-CM | POA: Diagnosis not present

## 2022-03-09 HISTORY — PX: IR THROMBECT PRIM MECH INIT (INCLU) MOD SED: IMG2297

## 2022-03-09 HISTORY — PX: IR ANGIOGRAM PULMONARY BILATERAL SELECTIVE: IMG664

## 2022-03-09 HISTORY — PX: IR US GUIDE VASC ACCESS RIGHT: IMG2390

## 2022-03-09 HISTORY — PX: RADIOLOGY WITH ANESTHESIA: SHX6223

## 2022-03-09 HISTORY — PX: IR ANGIOGRAM SELECTIVE EACH ADDITIONAL VESSEL: IMG667

## 2022-03-09 LAB — CBC
HCT: 41.5 % (ref 39.0–52.0)
Hemoglobin: 13.4 g/dL (ref 13.0–17.0)
MCH: 30.1 pg (ref 26.0–34.0)
MCHC: 32.3 g/dL (ref 30.0–36.0)
MCV: 93.3 fL (ref 80.0–100.0)
Platelets: 198 10*3/uL (ref 150–400)
RBC: 4.45 MIL/uL (ref 4.22–5.81)
RDW: 12.2 % (ref 11.5–15.5)
WBC: 11.6 10*3/uL — ABNORMAL HIGH (ref 4.0–10.5)
nRBC: 0 % (ref 0.0–0.2)

## 2022-03-09 LAB — TYPE AND SCREEN
ABO/RH(D): O POS
Antibody Screen: NEGATIVE

## 2022-03-09 LAB — TROPONIN I (HIGH SENSITIVITY)
Troponin I (High Sensitivity): 81 ng/L — ABNORMAL HIGH (ref <18)
Troponin I (High Sensitivity): 74 ng/L — ABNORMAL HIGH (ref <18)

## 2022-03-09 LAB — HEPARIN LEVEL (UNFRACTIONATED)
Heparin Unfractionated: 0.25 IU/mL — ABNORMAL LOW (ref 0.30–0.70)
Heparin Unfractionated: 0.3 IU/mL (ref 0.30–0.70)

## 2022-03-09 LAB — HIV ANTIBODY (ROUTINE TESTING W REFLEX): HIV Screen 4th Generation wRfx: NONREACTIVE

## 2022-03-09 LAB — LACTIC ACID, PLASMA: Lactic Acid, Venous: 1.1 mmol/L (ref 0.5–1.9)

## 2022-03-09 LAB — ABO/RH: ABO/RH(D): O POS

## 2022-03-09 LAB — MRSA NEXT GEN BY PCR, NASAL: MRSA by PCR Next Gen: NOT DETECTED

## 2022-03-09 SURGERY — IR WITH ANESTHESIA
Anesthesia: Monitor Anesthesia Care

## 2022-03-09 MED ORDER — LACTATED RINGERS IV SOLN: INTRAVENOUS | Status: DC | PRN

## 2022-03-09 MED ORDER — HYDROCODONE-ACETAMINOPHEN 5-325 MG PO TABS
1.0000 | ORAL_TABLET | Freq: Four times a day (QID) | ORAL | Status: DC | PRN
  Administered 2022-03-09: 2 via ORAL
  Filled 2022-03-09: qty 2

## 2022-03-09 MED ORDER — MIDAZOLAM HCL 2 MG/2ML IJ SOLN
INTRAMUSCULAR | Status: DC | PRN
  Administered 2022-03-09 (×2): 1 mg via INTRAVENOUS
  Administered 2022-03-09: 2 mg via INTRAVENOUS

## 2022-03-09 MED ORDER — ATORVASTATIN CALCIUM 40 MG PO TABS
40.0000 mg | ORAL_TABLET | Freq: Every day | ORAL | Status: DC
  Administered 2022-03-09 – 2022-03-14 (×6): 40 mg via ORAL
  Filled 2022-03-09 (×6): qty 1

## 2022-03-09 MED ORDER — CHLORHEXIDINE GLUCONATE CLOTH 2 % EX PADS
6.0000 | MEDICATED_PAD | Freq: Every day | CUTANEOUS | Status: DC
  Administered 2022-03-09 – 2022-03-14 (×6): 6 via TOPICAL

## 2022-03-09 MED ORDER — ORAL CARE MOUTH RINSE: 15.0000 mL | OROMUCOSAL | Status: DC | PRN

## 2022-03-09 MED ORDER — LACTATED RINGERS IV SOLN: INTRAVENOUS | Status: DC

## 2022-03-09 MED ORDER — FENTANYL CITRATE (PF) 250 MCG/5ML IJ SOLN
INTRAMUSCULAR | Status: DC | PRN
  Administered 2022-03-09 (×2): 50 ug via INTRAVENOUS
  Administered 2022-03-09 (×2): 25 ug via INTRAVENOUS
  Administered 2022-03-09: 50 ug via INTRAVENOUS

## 2022-03-09 MED ORDER — IBUPROFEN 200 MG PO TABS: 200.0000 mg | ORAL_TABLET | Freq: Four times a day (QID) | ORAL | Status: DC | PRN

## 2022-03-09 MED ORDER — METOPROLOL SUCCINATE ER 50 MG PO TB24: 50.0000 mg | ORAL_TABLET | Freq: Every day | ORAL | Status: DC

## 2022-03-09 MED ORDER — MORPHINE SULFATE (PF) 2 MG/ML IV SOLN
2.0000 mg | INTRAVENOUS | Status: DC | PRN
  Administered 2022-03-09: 2 mg via INTRAVENOUS
  Filled 2022-03-09 (×2): qty 1

## 2022-03-09 MED ORDER — LABETALOL HCL 5 MG/ML IV SOLN
10.0000 mg | Freq: Four times a day (QID) | INTRAVENOUS | Status: DC | PRN
  Administered 2022-03-10: 10 mg via INTRAVENOUS
  Filled 2022-03-09: qty 4

## 2022-03-09 MED ORDER — MORPHINE SULFATE (PF) 4 MG/ML IV SOLN
4.0000 mg | INTRAVENOUS | Status: DC | PRN
  Administered 2022-03-09 (×2): 4 mg via INTRAVENOUS
  Filled 2022-03-09 (×2): qty 1

## 2022-03-09 MED ORDER — HEPARIN BOLUS VIA INFUSION
3000.0000 [IU] | Freq: Once | INTRAVENOUS | Status: AC
  Administered 2022-03-09: 3000 [IU] via INTRAVENOUS
  Filled 2022-03-09: qty 3000

## 2022-03-09 MED ORDER — LIDOCAINE HCL 1 % IJ SOLN
INTRAMUSCULAR | Status: AC
  Filled 2022-03-09: qty 20

## 2022-03-09 MED ORDER — IOHEXOL 300 MG/ML  SOLN
100.0000 mL | Freq: Once | INTRAMUSCULAR | Status: AC | PRN
  Administered 2022-03-09: 40 mL via INTRA_ARTERIAL

## 2022-03-09 MED ORDER — MORPHINE SULFATE (PF) 2 MG/ML IV SOLN
INTRAVENOUS | Status: AC
  Administered 2022-03-09: 2 mg via INTRAVENOUS
  Filled 2022-03-09: qty 2

## 2022-03-09 MED ORDER — IOHEXOL 300 MG/ML  SOLN
100.0000 mL | Freq: Once | INTRAMUSCULAR | Status: AC | PRN
  Administered 2022-03-09: 35 mL via INTRA_ARTERIAL

## 2022-03-09 MED ORDER — ORAL CARE MOUTH RINSE
15.0000 mL | OROMUCOSAL | Status: DC
  Administered 2022-03-09 – 2022-03-14 (×17): 15 mL via OROMUCOSAL

## 2022-03-09 MED ORDER — HEPARIN SODIUM (PORCINE) 1000 UNIT/ML IJ SOLN
INTRAMUSCULAR | Status: DC | PRN
  Administered 2022-03-09: 5000 [IU] via INTRAVENOUS

## 2022-03-09 MED ORDER — MORPHINE SULFATE (PF) 2 MG/ML IV SOLN
2.0000 mg | INTRAVENOUS | Status: DC | PRN
  Administered 2022-03-09 – 2022-03-11 (×8): 2 mg via INTRAVENOUS
  Filled 2022-03-09 (×8): qty 1

## 2022-03-09 NOTE — Anesthesia Procedure Notes (Addendum)
Arterial Line Insertion Start/End7/02/2022 10:34 AM, 03/09/2022 10:44 AM Performed by: Leonor Liv, CRNA, CRNA  Patient location: OOR procedure area. Preanesthetic checklist: patient identified, IV checked, site marked, risks and benefits discussed, surgical consent, monitors and equipment checked, pre-op evaluation, timeout performed and anesthesia consent Lidocaine 1% used for infiltration Left, radial was placed Catheter size: 20 G Hand hygiene performed  and maximum sterile barriers used  Allen's test indicative of satisfactory collateral circulation Attempts: 1 Procedure performed without using ultrasound guided technique. Following insertion, dressing applied and Biopatch. Patient tolerated the procedure well with no immediate complications.

## 2022-03-09 NOTE — Assessment & Plan Note (Addendum)
Surgical site healing well.  Leaking urinary catheter problematic.  -We will remove staples today, per Dr. Sherlon Handing (urologist) - Continue oxybutynin - Morphine and Tylenol -Continue to monitor for any bleeding

## 2022-03-09 NOTE — Progress Notes (Signed)
ANTICOAGULATION CONSULT NOTE - Follow-Up Consult  Pharmacy Consult for heparin Indication: pulmonary embolus  No Known Allergies  Patient Measurements: Height: '5\' 9"'$  (175.3 cm) Weight: 104.6 kg (230 lb 9.6 oz) IBW/kg (Calculated) : 70.7 Heparin Dosing Weight: 93.2kg  Vital Signs: Temp: 98.5 F (36.9 C) (07/06 1924) Temp Source: Oral (07/06 1924) BP: 125/64 (07/06 1325) Pulse Rate: 80 (07/06 2100)  Labs: Recent Labs    03/08/22 1932 03/08/22 2139 03/09/22 0258 03/09/22 0425 03/09/22 1921  HGB 13.7  --   --  13.4  --   HCT 43.5  --   --  41.5  --   PLT 204  --   --  198  --   HEPARINUNFRC  --   --   --  0.25* 0.30  CREATININE 0.84  --   --   --   --   TROPONINIHS 11 46* 81* 74*  --      Estimated Creatinine Clearance: 114.3 mL/min (by C-G formula based on SCr of 0.84 mg/dL).   Medical History: Past Medical History:  Diagnosis Date   Abnormality of thoracic aorta    4.3 CM ECTATIC ASENDING    Cardiopathy    CHF (congestive heart failure) (HCC)    SYSTOLIC   Hyperlipidemia    Hypertension    Lung nodule    Obesity    Persistent atrial fibrillation (HCC)    WITH RVR   Pleural effusion    RIGHT UPPER LOBE AND RIGHT LOWER LOBE     Assessment: 62 YOM presenting with SOB, CT angio chest with PE, hx of afib on Eliquis PTA (last dose two weeks ago d/t procedure).  Hgb 13.4, Plt 198 Heparin level = 0.3 Heparin infusion rate: 1800 units/hr No s/sx of bleeding per RN  Goal of Therapy:  Heparin level 0.3-0.7 units/ml Monitor platelets by anticoagulation protocol: Yes   Plan:  Continue heparin at 1800 units/hr Re-check heparin level with AM labs Monitor daily CBC, heparin level, and s/sx of bleeding F/u long term [facility] plan  Luisa Hart, PharmD, BCPS Clinical Pharmacist 03/09/2022 9:37 PM   Please refer to [facility] for pharmacy phone number

## 2022-03-09 NOTE — Hospital Course (Addendum)
Mr. Jaiden Dinkins is a 61 year old male with past medical history significant for A-fib on Eliquis, history of PE, CHF, HTN, prostate cancer (s/p prostatectomy) who presented with shortness of breath and back pain.  Patient was found to have bilateral diffuse pulmonary emboli with saddle pulmonary embolus CT angiography with presence of right heart strain.  S. Pasi score 3 (history of cancer, SPO2 less than 90% initially, CHF) indicating high risk with 8.9% mortality.  Bilateral lower extremity ultrasound significant for***, echo significant for***.  CCM and interventional radiology consulted.  Patient was treated with nasal cannula oxygen.  Patient had thrombectomy on***.  Treated with heparin gtt.  Patient was normal sinus rhythm on telemetry.  Delene Loll was held metoprolol succinate was continued.  Items to follow-up On CT chest 7/09 dilated ascending thoracic aorta to 4.1 cm. Recommend annual imaging followup by CTA or MRA.  Recommend follow-up chest x-ray in 4-6 weeks to confirm complete resolution of left pleural effusion

## 2022-03-09 NOTE — Consult Note (Addendum)
Chief Complaint: Pulmonary embolism  Referring Physician(s): Phillips Hay  Supervising Physician: Mir, Sharen Heck  Patient Status: Jeffrey Griffin - In-pt  History of Present Illness: Jeffrey Griffin is a 61 y.o. male with history of Afib on Eliquis which was stopped for prostatectomy done one week ago,  He presented to the ED c/o SOB. He required 10L O2.  CTA showed saddle PE.  RV/LV ratio 0.9  sPESI score = 2 (High risk given positive troponins and Hx of CA) Bova Score = 2 pts, stage 1 (Low risk, despite clot burden)  Troponin 81  He is not a candidate for tPA due to recent prostatectomy and risk for bleeding complications.  Dr. Maryelizabeth Kaufmann and Dr. Dwaine Gale have reviewed imaging. He is a candidate for mechanical thrombectomy using Inari or Penumbra device.  He has been started on heparin drip.  He is NPO.   He is currently on CPAP. He is tachypneic = respirations 21  He is not tachycardic.  He c/o chest pain. States his SOB has not really improved with initiation of heparin drip.  He recently had morphine for chest pain.  Wife is at bedside for consent.  Past Medical History:  Diagnosis Date   Abnormality of thoracic aorta    4.3 CM ECTATIC ASENDING    Cardiopathy    CHF (congestive heart failure) (HCC)    SYSTOLIC   Hyperlipidemia    Hypertension    Lung nodule    Obesity    Persistent atrial fibrillation (HCC)    WITH RVR   Pleural effusion    RIGHT UPPER LOBE AND RIGHT LOWER LOBE     Past Surgical History:  Procedure Laterality Date   APPENDECTOMY     ATRIAL FIBRILLATION ABLATION N/A 07/16/2020   Procedure: ATRIAL FIBRILLATION ABLATION;  Surgeon: [name], MD;  Location: [facility];  Service: Cardiovascular;  Laterality: N/A;   CARDIOVERSION N/A 10/20/2019   Procedure: CARDIOVERSION;  Surgeon: Acie Fredrickson Wonda Cheng, MD;  Location: [facility] ENDOSCOPY;  Service: Cardiovascular;  Laterality: N/A;   CARDIOVERSION N/A 05/31/2020   Procedure: CARDIOVERSION;  Surgeon:  Josue Hector, MD;  Location: [facility] ENDOSCOPY;  Service: Cardiovascular;  Laterality: N/A;   VASECTOMY      Allergies: Patient has no known allergies.  Medications: Prior to Admission medications   Medication Sig Start Date End Date Taking? Authorizing Provider  acetaminophen (TYLENOL) 500 MG tablet Take 500 mg by mouth in the morning, at noon, and at bedtime.   Yes [provider]  apixaban (ELIQUIS) 5 MG TABS tablet TAKE 1 TABLET BY MOUTH TWICE A DAY Patient taking differently: Take 5 mg by mouth 2 (two) times daily. 08/15/21  Yes Allred, Jeneen Rinks, MD  atorvastatin (LIPITOR) 40 MG tablet TAKE 1 TABLET BY MOUTH EVERY DAY Patient taking differently: Take 40 mg by mouth daily. 11/29/21  Yes Nahser, Wonda Cheng, MD  Carnitine-B5-B6 500-15-5 MG TABS Take 1 tablet by mouth daily.   Yes [provider]  Cholecalciferol (VITAMIN D3) 25 MCG (1000 UT) CAPS Take 1,000 Units by mouth daily.    Yes [provider]  Coenzyme Q10 300 MG CAPS Take 1 capsule by mouth daily.   Yes [provider]  Cyanocobalamin (B-12) 5000 MCG CAPS Take 1 capsule by mouth daily.   Yes [provider]  diclofenac Sodium (VOLTAREN) 1 % GEL Apply 4 g topically 4 (four) times daily. Patient taking differently: Apply 4 g topically daily as needed (For pain). 01/28/21  Yes Lilland, Lorrin Goodell,  DO  Garlic 1017 MG CAPS Take 1 capsule by mouth daily.   Yes [provider]  Glucosamine-Chondroitin (COSAMIN DS PO) Take 2 tablets by mouth daily.   Yes [provider]  Hawthorn 150 MG CAPS Take 1 capsule by mouth daily at 6 (six) AM.   Yes [provider]  ibuprofen (ADVIL) 200 MG tablet Take 200 mg by mouth every 6 (six) hours as needed for mild pain.   Yes [provider]  meclizine (ANTIVERT) 25 MG tablet TAKE 1 TABLET BY MOUTH 3 TIMES DAILY AS NEEDED FOR DIZZINESS. Patient taking differently: Take 25 mg by mouth 3 (three) times daily as needed for dizziness. 09/07/21   Yes Lilland, Alana, DO  metoprolol succinate (TOPROL-XL) 50 MG 24 hr tablet TAKE 1 TABLET BY MOUTH DAILY. TAKE WITH OR IMMEDIATELY FOLLOWING A MEAL. Patient taking differently: Take 50 mg by mouth daily. 10/06/21  Yes Nahser, Wonda Cheng, MD  Multiple Minerals-Vitamins (CAL MAG ZINC +D3 PO) Take 1 tablet by mouth daily.    Yes [provider]  Multiple Vitamin (MULTIVITAMIN WITH MINERALS) TABS tablet Take 1 tablet by mouth daily.   Yes [provider]  Omega-3 1400 MG CAPS Take 1 capsule by mouth daily.   Yes [provider]  oxybutynin (DITROPAN) 5 MG tablet Take 5 mg by mouth 3 (three) times daily. 03/06/22  Yes [provider]  sacubitril-valsartan (ENTRESTO) 97-103 MG Take 1 tablet by mouth 2 (two) times daily. 12/05/21  Yes Nahser, Wonda Cheng, MD  Taurine 1000 MG CAPS Take 1 capsule by mouth daily.   Yes [provider]  vitamin E 180 MG (400 UNITS) capsule Take 400 Units by mouth daily.   Yes [provider]  Zn-Pyg Afri-Nettle-Saw Palmet (SAW PALMETTO COMPLEX PO) Take 1 tablet by mouth daily.   Yes [provider]     Family History  Problem Relation Age of Onset   Hypertrophic cardiomyopathy Mother    Stroke Father    Hypertension Father    Hypertension Sister    Atrial fibrillation Brother     Social History   Socioeconomic History   Marital status: Married    Spouse name: Not on file   Number of children: Not on file   Years of education: Not on file   Highest education level: Not on file  Occupational History   Not on file  Tobacco Use   Smoking status: Never   Smokeless tobacco: Never  Substance and Sexual Activity   Alcohol use: Not Currently   Drug use: No   Sexual activity: Not on file  Other Topics Concern   Not on file  Social History Narrative   Lives in [place].   Social Determinants of Health   Financial Resource Strain: Not on file  Food Insecurity: Not on file  Transportation Needs:  Not on file  Physical Activity: Not on file  Stress: Not on file  Social Connections: Not on file     Review of Systems: A 12 point ROS discussed and pertinent positives are indicated in the HPI above.  All other systems are negative.  Review of Systems  Vital Signs: BP 119/72   Pulse 66   Temp 98.4 F (36.9 C) (Oral)   Resp 19   Ht '5\' 9"'$  (1.753 m)   Wt 230 lb (104.3 kg)   SpO2 95%   BMI 33.97 kg/m   Physical Exam Vitals reviewed.  Constitutional:      Appearance:  Normal appearance.  HENT:     Head: Normocephalic and atraumatic.  Eyes:     Extraocular Movements: Extraocular movements intact.  Cardiovascular:     Rate and Rhythm: Normal rate and regular rhythm.  Pulmonary:     Effort: Respiratory distress present.     Breath sounds: Normal breath sounds.     Comments: On CPAP. Respirations 21 Abdominal:     General: There is no distension.     Palpations: Abdomen is soft.     Tenderness: There is no abdominal tenderness.  Musculoskeletal:        General: Normal range of motion.     Cervical back: Normal range of motion.  Skin:    General: Skin is warm and dry.  Neurological:     General: No focal deficit present.     Mental Status: He is alert and oriented to person, place, and time.  Psychiatric:        Mood and Affect: Mood normal.        Behavior: Behavior normal.        Thought Content: Thought content normal.     Imaging: CT Angio Chest PE W and/or Wo Contrast  Result Date: 03/08/2022 CLINICAL DATA:  Pulmonary embolism suspected, high probability. Sharp chest pain. EXAM: CT ANGIOGRAPHY CHEST WITH CONTRAST TECHNIQUE: Multidetector CT imaging of the chest was performed using the standard protocol during bolus administration of intravenous contrast. Multiplanar CT image reconstructions and MIPs were obtained to evaluate the vascular anatomy. RADIATION DOSE REDUCTION: This exam was performed according to the departmental dose-optimization program which  includes automated exposure control, adjustment of the mA and/or kV according to patient size and/or use of iterative reconstruction technique. CONTRAST:  39m OMNIPAQUE IOHEXOL 350 MG/ML SOLN COMPARISON:  01/17/2022. FINDINGS: Cardiovascular: The heart is borderline enlarged and there is no pericardial effusion. There is aneurysmal dilatation of the ascending aorta measuring 4.1 cm. The pulmonary trunk is distended suggesting underlying pulmonary artery hypertension. There are extensive bilateral pulmonary emboli with a saddle embolus. Evaluation of the segmental and subsegmental arteries is limited due to suboptimal opacification. The right ventricle is distended suggesting right heart strain. Mediastinum/Nodes: No mediastinal, hilar, or axillary lymphadenopathy. Thyroid gland, trachea, and esophagus are within normal limits. There is a small hiatal hernia. Lungs/Pleura: Strandy atelectasis or infiltrate is noted at the lung bases. There is focal consolidation in the lingular segment of the left upper lobe. Trace bilateral pleural effusions are noted. No pneumothorax. Upper Abdomen: No acute abnormality. Musculoskeletal: Degenerative changes are present in the thoracic spine. No acute osseous abnormality is seen. Review of the MIP images confirms the above findings. IMPRESSION: 1. Extensive bilateral pulmonary emboli with saddle pulmonary embolus. The right ventricle is distended suggesting underlying right heart strain. 2. Distended pulmonary trunk which may be associated with underlying pulmonary artery hypertension. 3. Aneurysmal dilatation of the ascending aorta measuring 4.1 cm. Recommend annual imaging followup by CTA or MRA. This recommendation follows 2010 ACCF/AHA/AATS/ACR/ASA/SCA/SCAI/SIR/STS/SVM Guidelines for the Diagnosis and Management of Patients with Thoracic Aortic Disease. Circulation. 2010; 121:: G182-X937 Aortic aneurysm NOS (ICD10-I71.9) 4. Strandy atelectasis at the lung bases. There is a  focal opacity in the lingular segment of the left upper lobe, possible pulmonary infarct versus infectious or inflammatory process. 5. Trace bilateral pleural effusions. Critical findings were reported to Dr. JIsla Penceat 11:07 p.m. Electronically Signed   By: LBrett FairyM.D.   On: 03/08/2022 23:09   DG Chest 2 View  Result Date: 03/08/2022 CLINICAL DATA:  61 year old male presenting with shortness of breath. EXAM: CHEST - 2 VIEW COMPARISON:  September 15, 2019. FINDINGS: Trachea midline. Cardiomediastinal contours and hilar structures are normal. Subtle opacity at the LEFT lung base. No lobar consolidation. No sign of pleural effusion. Opacity just lateral to the costodiaphragmatic sulcus. No pneumothorax. On limited assessment there is no acute skeletal finding. IMPRESSION: Subtle opacity at the LEFT lung base may represent atelectasis or developing infection. New from previous imaging. Electronically Signed   By: Zetta Bills M.D.   On: 03/08/2022 20:02    Labs:  CBC: Recent Labs    03/28/21 2025 07/13/21 0826 03/08/22 1932 03/09/22 0425  WBC 9.0 6.4 14.0* 11.6*  HGB 15.7 15.3 13.7 13.4  HCT 48.6 45.9 43.5 41.5  PLT 239 244 204 198    COAGS: No results for input(s): "INR", "APTT" in the last 8760 hours.  BMP: Recent Labs    03/28/21 2025 07/13/21 0826 12/26/21 0723 03/08/22 1932  NA 138 139 138 137  K 4.1 4.2 4.2 4.2  CL 104 102 102 103  CO2 '27 26 24 22  '$ GLUCOSE 99 105* 122* 115*  BUN '8 20 14 20  '$ CALCIUM 10.2 10.6* 10.5* 9.8  CREATININE 0.90 0.92 1.10 0.84  GFRNONAA >60  --   --  >60    LIVER FUNCTION TESTS: Recent Labs    03/28/21 2025 03/08/22 1932  BILITOT 0.9 0.9  AST 28 26  ALT 29 30  ALKPHOS 75 76  PROT 7.1 6.5  ALBUMIN 4.0 2.9*    TUMOR MARKERS: No results for input(s): "AFPTM", "CEA", "CA199", "CHROMGRNA" in the last 8760 hours.  Assessment and Plan:  Saddle PE = symptomatic with tachypnea and chest pain.  RV/LV ratio 0.9  sPESI score =  2 (High risk given positive troponins and Hx of CA) Bova Score = 2 pts, stage 1 (Low risk, despite clot burden)  Troponin 81  Not much improvement with initiation of heparin drip.  Not a candidate for tPA given recent surgery.  Images reviewed by Dr. Dwaine Gale. Will plan for Pulmonary angiography with mechanical thrombectomy ASAP with Anesthesia.  Risks and benefits discussed with the patient and his wife including, but not limited to bleeding, infection, vascular injury, or even death.  All questions were answered, patient is agreeable to proceed. Consent signed and in chart.  Thank you for allowing our service to participate in Liandro Thelin 's care.  Electronically Signed: Murrell Redden, PA-C   03/09/2022, 8:39 AM      I spent a total of 40 Minutes  in face to face in clinical consultation, greater than 50% of which was counseling/coordinating care for PE thrombectomy

## 2022-03-09 NOTE — Assessment & Plan Note (Signed)
History of sleep apnea - Continue CPAP use

## 2022-03-09 NOTE — Sedation Documentation (Signed)
Pt transported to PACU with CRNA. Hand off of care and report given to [facility], RN. All questions answered prior to this RN's departure. Right groin assessed. No bleeding or hematoma present.

## 2022-03-09 NOTE — Anesthesia Procedure Notes (Signed)
Procedure Name: MAC Date/Time: 03/09/2022 10:45 AM  Performed by: Leonor Liv, CRNAPre-anesthesia Checklist: Patient identified, Emergency Drugs available, Suction available, Patient being monitored and Timeout performed Patient Re-evaluated:Patient Re-evaluated prior to induction Placement Confirmation: positive ETCO2 Dental Injury: Teeth and Oropharynx as per pre-operative assessment  Comments: CPAP mask placed with pressure support to patient comfort, patient ventilating well supine, VSS

## 2022-03-09 NOTE — Progress Notes (Signed)
NAME:  Jeffrey Griffin, MRN:  [id number], DOB:  11/30/1960, LOS: 0 ADMISSION DATE:  03/08/2022, CONSULTATION DATE: 03/08/2022 REFERRING MD: Dr. Gilford Raid, CHIEF COMPLAINT: Pulmonary embolism  History of Present Illness:  61 year old with paroxysmal atrial fibrillation hypertension and hyperlipidemia who presented with chest pain and shortness of breath, noted to have saddle PE with right-sided .  Patient was hypoxic requiring oxygen, not a candidate for systemic thrombolysis due to recent prostatectomy on 03/01/2022 patient presented with chest discomfort, shortness of breath.  Noted to use oxygen levels were in the 80s at home when he checked his oxygen Of note patient is off Eliquis until his catheter comes out on 7/10   Pertinent  Medical History   Past Medical History:  Diagnosis Date   Abnormality of thoracic aorta    4.3 CM ECTATIC ASENDING    Cardiopathy    CHF (congestive heart failure) (HCC)    SYSTOLIC   Hyperlipidemia    Hypertension    Lung nodule    Obesity    Persistent atrial fibrillation (HCC)    WITH RVR   Pleural effusion    RIGHT UPPER LOBE AND RIGHT LOWER LOBE      Significant Hospital Events: Including procedures, antibiotic start and stop dates in addition to other pertinent events   CT chest-saddle embolus with evidence of right heart strain  Interim History / Subjective:  Chest discomfort, shortness of breath   Objective   Blood pressure 125/64, pulse 81, temperature 98.1 F (36.7 C), resp. rate (!) 39, height '5\' 9"'$  (1.753 m), weight 104.6 kg, SpO2 97 %.        Intake/Output Summary (Last 24 hours) at 03/09/2022 1444 Last data filed at 03/09/2022 1400 Gross per 24 hour  Intake 934.98 ml  Output 850 ml  Net 84.98 ml   Filed Weights   03/08/22 1914 03/09/22 1400  Weight: 104.3 kg 104.6 kg    Examination: Physical exam: General: Acute ill-appearing male, lying on the bed on NRM HEENT: [place]/AT, eyes anicteric.  moist mucus membranes Neuro: Alert,  awake following commands Chest: Coarse breath sounds, no wheezes or rhonchi Heart: Regular rate and rhythm, no murmurs or gallops Abdomen: Soft, nontender, nondistended, bowel sounds present Skin: No rash   Resolved Hospital Problem list     Assessment & Plan:  Acute submassive PE Acute right popliteal DVT Acute hypoxemic respiratory failure S/p recent prostatectomy Paroxysmal atrial fibrillation Chronic HFpEF OSA on Bipap  Patient is still requiring nonrebreather mask, complaining of right-sided chest pain I spoke with IR considering submassive PE with right-sided heart strain and elevated troponins, patient taken for catheter directed thrombectomy Continue IV heparin infusion Doppler lower extremity confirmed acute right extremity DVT Continue titrate oxygen with O2 sat goal 92% Remain in sinus rhythm Monitor intake and output CPAP nightly   Best Practice (right click and "Reselect all SmartList Selections" daily)   Diet/type: Regular consistency (see orders) DVT prophylaxis: systemic heparin GI prophylaxis: N/A Lines: N/A Foley:  N/A Code Status:  full code Last date of multidisciplinary goals of care discussion [pending]  Labs   CBC: Recent Labs  Lab 03/08/22 1932 03/09/22 0425  WBC 14.0* 11.6*  NEUTROABS 10.8*  --   HGB 13.7 13.4  HCT 43.5 41.5  MCV 93.8 93.3  PLT 204 182    Basic Metabolic Panel: Recent Labs  Lab 03/08/22 1932  NA 137  K 4.2  CL 103  CO2 22  GLUCOSE 115*  BUN 20  CREATININE 0.84  CALCIUM  9.8   GFR: Estimated Creatinine Clearance: 114.3 mL/min (by C-G formula based on SCr of 0.84 mg/dL). Recent Labs  Lab 03/08/22 1932 03/09/22 0425  WBC 14.0* 11.6*  LATICACIDVEN  --  1.1    Liver Function Tests: Recent Labs  Lab 03/08/22 1932  AST 26  ALT 30  ALKPHOS 76  BILITOT 0.9  PROT 6.5  ALBUMIN 2.9*   No results for input(s): "LIPASE", "AMYLASE" in the last 168 hours. No results for input(s): "AMMONIA" in the last 168  hours.  ABG    Component Value Date/Time   TCO2 27 05/31/2020 0830     Coagulation Profile: No results for input(s): "INR", "PROTIME" in the last 168 hours.  Cardiac Enzymes: No results for input(s): "CKTOTAL", "CKMB", "CKMBINDEX", "TROPONINI" in the last 168 hours.  HbA1C: Hemoglobin A1C  Date/Time Value Ref Range Status  01/16/2022 12:00 AM 5.4 4.0 - 5.6 % Final    CBG: No results for input(s): "GLUCAP" in the last 168 hours.    Total critical care time: 39 minutes  Performed by: [place] care time was exclusive of separately billable procedures and treating other patients.   Critical care was necessary to treat or prevent imminent or life-threatening deterioration.   Critical care was time spent personally by me on the following activities: development of treatment plan with patient and/or surrogate as well as nursing, discussions with consultants, evaluation of patient's response to treatment, examination of patient, obtaining history from patient or surrogate, ordering and performing treatments and interventions, ordering and review of laboratory studies, ordering and review of radiographic studies, pulse oximetry and re-evaluation of patient's condition.   Jacky Kindle, MD [facility] See Amion for pager If no response to pager, please call [phone] until 7pm After 7pm, Please call E-link [phone]

## 2022-03-09 NOTE — Sedation Documentation (Signed)
PAP end thrombectomy 67/16 (36)

## 2022-03-09 NOTE — ED Notes (Signed)
Patient transported to IR 

## 2022-03-09 NOTE — ED Notes (Signed)
MD Ander Slade stated pt is okay to eat and drink. This RN provided food at bedside. NRB removed and pt placed on 4L [place], O2 sats 93%.

## 2022-03-09 NOTE — Transfer of Care (Signed)
Immediate Anesthesia Transfer of Care Note  Patient: Jeffrey Griffin  Procedure(s) Performed: IR WITH ANESTHESIA  Patient Location: PACU  Anesthesia Type:MAC  Level of Consciousness: awake, alert  and oriented  Airway & Oxygen Therapy: Patient Spontanous Breathing and Patient connected to face mask oxygen  Post-op Assessment: Report given to RN, Post -op Vital signs reviewed and stable and Patient moving all extremities  Post vital signs: Reviewed and stable  Last Vitals:  Vitals Value Taken Time  BP 124/69 03/09/22 1300  Temp 36.7 C 03/09/22 1255  Pulse 83 03/09/22 1309  Resp 28 03/09/22 1309  SpO2 99 % 03/09/22 1309  Vitals shown include unvalidated device data.  Last Pain:  Vitals:   03/09/22 1255  TempSrc:   PainSc: 0-No pain         Complications: No notable events documented.

## 2022-03-09 NOTE — ED Provider Notes (Signed)
Care of the patient assumed at the change of shift. Here with saddle PE with hypoxia and right heart strain pending CCM consult. Heparin ordered, recent prostate surgery.  Physical Exam  BP (!) 151/76   Pulse 89   Temp 98.4 F (36.9 C) (Oral)   Resp 18   Ht '5\' 9"'$  (1.753 m)   Wt 104.3 kg   SpO2 95%   BMI 33.97 kg/m   Physical Exam  Procedures  Procedures  ED Course / MDM   Clinical Course as of 03/09/22 0358  Thu Mar 09, 2022  0018 CCM has evaluated patient, he is on 4L [place] now and hemodynamically stable. Can be admitted to the medicine service. IR will be consulted for consideration of thrombectomy. Patient goes to [facility], they were paged for admission.  [CS]  0031 Spoke with the FM Resident who will evaluate for admission.  [CS]    Clinical Course User Index [CS] Truddie Hidden, MD   Medical Decision Making Amount and/or Complexity of Data Reviewed Labs: ordered. Radiology: ordered.  Risk Prescription drug management. Decision regarding hospitalization.          Truddie Hidden, MD 03/09/22 [phone]

## 2022-03-09 NOTE — Progress Notes (Signed)
[facility] Progress Note Patient Name: Jeffrey Griffin DOB: 01/13/1961 MRN: [id number]   Date of Service  03/09/2022  HPI/Events of Note  Respirations labored in spite of NRB O2.   eICU Interventions  Plan: BiPAP PRN. RT to adjust IPAP and EPAP.      Intervention Category Major Interventions: Other:  Lysle Dingwall 03/09/2022, 7:44 PM

## 2022-03-09 NOTE — Assessment & Plan Note (Addendum)
History of A-fib on Eliquis. NSR on tele. - Continue Eliquis, continue metoprolol succinate - Continuous telemetry - FYI'd cardiology, follows with Dr. Lourena Simmonds and Dr. Tillie Rung

## 2022-03-09 NOTE — Sedation Documentation (Signed)
Main PAP pre thrombectomy 62/10 (29)

## 2022-03-09 NOTE — Progress Notes (Signed)
Bilateral lower extremity venous duplex has been completed. Preliminary results can be found in CV Proc through chart review.  Results were given to Dr. Ruben Im.  03/09/22 10:25 AM Jeffrey Griffin RVT

## 2022-03-09 NOTE — ED Notes (Signed)
Pt called out in respiratory distress. O2 90s on 4L [place]. Pt was placed on NRB. MD Gwendolyn Lima paged. MD was notified about elevated trop and pain medication needs.

## 2022-03-09 NOTE — Assessment & Plan Note (Addendum)
Saddle pulmonary embolus with extensive bilateral pulmonary emboli confirmed by CT angio with right heart strain. Hemodynamically stable of 5L [place]. Likely provoked in the setting of recent surgery, off coagulation x2 weeks, A-fib, CHF, obesity, OSA. sPESI score 3 (hx of cancer, SpO2  <90% initially, CHF) indicating high risk with 8.9% mortality. S/p thrombectomy, with improving respiratory status - Transfer to FMTS 03/11/22 - Continue Eliquis '10mg'$  - Continue continuous telemetry and pulse oximetry -Oxygen therapy as needed to keep O2 saturations 94% or greater - Vital signs per nursing protocol; monitor BP - Morphine '2mg'$  q4, Tylenol - PT/OT eval and treat - Stop LR 148m/hr

## 2022-03-09 NOTE — Progress Notes (Signed)
Daily Progress Note Intern Pager: [phone]  Patient name: Jeffrey Griffin Medical record number: [id number] Date of birth: September 29, 1960 Age: 61 y.o. Gender: male  Primary Care Provider: [name], DO Consultants: PCCM, IR Code Status: FULL  Pt Overview and Major Events to Date:  Jeffrey Griffin is a 61 y.o. male presenting with shortness of breath and back pain.  Patient admitted for saddle PE in setting of recent prostatectomy and stopping Eliquis. PMH significant for A-fib on Eliquis, history of PE, CHF, HTN, prostate cancer (s/p prostatectomy). -PCCM consulted 7/623 -IR consulted 03/09/22 -Thrombectomy 03/09/22  Assessment and Plan:  * Pulmonary embolism ([place]) Saddle pulmonary embolus with extensive bilateral pulmonary emboli confirmed by CT angio with right heart strain. Hemodynamically stable of 5L [place]. Likely provoked in the setting of recent surgery, off coagulation x2 weeks, A-fib, CHF, obesity, OSA. sPESI score 3 (hx of cancer, SpO2  <90% initially, CHF) indicating high risk with 8.9% mortality. - Admitted to FP TS, progressive, attending Dr. Erin Griffin - Continue heparin GTT - Continue continuous telemetry and pulse oximetry -Oxygen therapy as needed to keep O2 saturations 94% or greater - Vital signs per nursing protocol; monitor BP - Continue troponins, lactic acid - Bilateral lower extremity DVTs found on [place] - Thrombectomy performed 03/09/22, patient management transferred to ICU - PCCM onboard - N.p.o. - Norco and ibuprofen for pain control - PT/OT eval and treat  Persistent atrial fibrillation (HCC) History of A-fib on Eliquis. NSR on tele. - Hold Eliquis given heparin therapy, continue metoprolol succinate - Continuous telemetry  Chronic combined systolic and diastolic CHF (congestive heart failure) ([place]) 2022 echo shows LVEF of 55% - Hold Entresto  OSA (obstructive sleep apnea) History of sleep apnea - Continue CPAP use  S/P prostatectomy Surgical  incisions appear to be healing well.  - Continue oxybutynin - Norco and ibuprofen for pain control - Continue to monitor surgical incision sites - Continue to monitor for any bleeding       FEN/GI: NPO PPx: Heparin gtt Dispo:Pending clinical improvement  Subjective:  No acute events since admission during the night. MD helped switch patient from CPAP to non-rebreather 10L/min. Some chest pain with inspiration. No stomach pain. Patient is aware of plan for thrombectomy.   Objective: Temp:  [98 F (36.7 C)-98.4 F (36.9 C)] 98.1 F (36.7 C) (07/06 1325) Pulse Rate:  [65-92] 82 (07/06 1325) Resp:  [16-38] 29 (07/06 1325) BP: (105-159)/(62-83) 125/64 (07/06 1325) SpO2:  [90 %-100 %] 99 % (07/06 1325) Arterial Line BP: (147-150)/(53-54) 147/53 (07/06 1325) Weight:  [104.3 kg] 104.3 kg (07/05 1914) Physical Exam: General: Lying in hospital bed in some distress Cardiovascular: Regular rate and rhythm, no murmurs Respiratory: Increase work of breathing on CPAP, CTAB Abdomen: soft, non tender diffusely, no rebound or guarding Extremities: Radial pulses 2+ bilaterally  Laboratory: Most recent CBC Lab Results  Component Value Date   WBC 11.6 (H) 03/09/2022   HGB 13.4 03/09/2022   HCT 41.5 03/09/2022   MCV 93.3 03/09/2022   PLT 198 03/09/2022   Most recent BMP    Latest Ref Rng & Units 03/08/2022    7:32 PM  BMP  Glucose 70 - 99 mg/dL 115   BUN 6 - 20 mg/dL 20   Creatinine 0.61 - 1.24 mg/dL 0.84   Sodium 135 - 145 mmol/L 137   Potassium 3.5 - 5.1 mmol/L 4.2   Chloride 98 - 111 mmol/L 103   CO2 22 - 32 mmol/L 22   Calcium  8.9 - 10.3 mg/dL 9.8     Other pertinent labs: Trops 81 (74), LDL 154, Heparin level - 0.25  Imaging/Diagnostic Tests: CTA Chest IMPRESSION: 1. Extensive bilateral pulmonary emboli with saddle pulmonary embolus. The right ventricle is distended suggesting underlying right heart strain. 2. Distended pulmonary trunk which may be associated with  underlying pulmonary artery hypertension. 3. Aneurysmal dilatation of the ascending aorta measuring 4.1 cm. Recommend annual imaging followup by CTA or MRA. This recommendation follows 2010 ACCF/AHA/AATS/ACR/ASA/SCA/SCAI/SIR/STS/SVM Guidelines for the Diagnosis and Management of Patients with Thoracic Aortic Disease. Circulation. 2010; 121: Q330-Q762. Aortic aneurysm NOS (ICD10-I71.9) 4. Strandy atelectasis at the lung bases. There is a focal opacity in the lingular segment of the left upper lobe, possible pulmonary infarct versus infectious or inflammatory process. 5. Trace bilateral pleural effusions.   Jeffrey Oxford, MD 03/09/2022, 1:57 PM  PGY-1, [place] Intern pager: [phone], text pages welcome Secure chat group [place]

## 2022-03-09 NOTE — Progress Notes (Signed)
ANTICOAGULATION CONSULT NOTE - Follow Up Consult  Pharmacy Consult for heparin Indication:  PE and Afib  Labs: Recent Labs    03/08/22 1932 03/08/22 2139 03/09/22 0258 03/09/22 0425  HGB 13.7  --   --  13.4  HCT 43.5  --   --  41.5  PLT 204  --   --  198  HEPARINUNFRC  --   --   --  0.25*  CREATININE 0.84  --   --   --   TROPONINIHS 11 46* 81* 74*    Assessment: 60yo male subtherapeutic on heparin with initial dosing for PE, also on AC PTA for Afib (had been held for procedure); no infusion issues or signs of bleeding per RN.  Goal of Therapy:  Heparin level 0.3-0.7 units/ml   Plan:  Will rebolus with heparin 3000 units and increase heparin infusion by 2 units/kg/hr to 1800 units/hr and check level in 6 hours.    Wynona Neat, PharmD, BCPS  03/09/2022,5:29 AM

## 2022-03-09 NOTE — Assessment & Plan Note (Addendum)
ECHO this admission EF 55%-60%, stable from previous in 2022. Restart Eliquis at low-dose, watch BP closely.  If BP tolerates, consider titrating up to home dose.

## 2022-03-09 NOTE — H&P (Addendum)
Hospital Admission History and Physical Service Pager: [phone]  Patient name: Jeffrey Griffin Medical record number: [id number] Date of Birth: 1960-09-23 Age: 61 y.o. Gender: male  Primary Care Provider: [name], DO Consultants: Interventional Radiology, PCCM  Code Status: FULL CODE  Preferred Emergency Contact:  Contact Information     Name Relation Home Work [place] Spouse   [phone]   [facility] Daughter   [phone]      Chief Complaint: SOB   Assessment and Plan: Jeffrey Griffin is a 61 y.o. male presenting with shortness of breath and back pain.  Patient recently stopped Eliquis 2 weeks ago for prostatectomy 1 week ago.  CT angiogram confirms saddle embolism and diffuse emboli. PMH significant for A-fib on Eliquis, history of PE, CHF, HTN, prostate cancer (s/p prostatectomy).  * Pulmonary embolism (HCC) Saddle pulmonary embolus with extensive bilateral pulmonary emboli confirmed by CT angio with right heart strain. Hemodynamically stable of 5L [place]. Likely provoked in the setting of recent surgery, off coagulation x2 weeks, A-fib, CHF, obesity, OSA. sPESI score 3 (hx of cancer, SpO2  <90% initially, CHF) indicating high risk with 8.9% mortality. - Admitted to FP TS, progressive, attending Dr. Erin Hearing - Continue heparin GTT - Continue continuous telemetry and pulse oximetry -Oxygen therapy as needed to keep O2 saturations 94% or greater - Vital signs per nursing protocol; monitor BP - Continue troponins - Follow-up bilateral lower extremity ultrasound, echo - Follow-up with interventional radiology consultant, plan for thrombectomy tomorrow a.m. - PCCM consulted and will follow  - N.p.o. - Norco and ibuprofen for pain control - PT/OT eval and treat  Persistent atrial fibrillation ([place]) History of A-fib on Eliquis. NSR on tele. - Hold Eliquis given heparin therapy, continue metoprolol succinate - Continuous telemetry  Chronic combined  systolic and diastolic CHF (congestive heart failure) ([place]) 2022 echo shows LVEF of 55% - Hold Entresto  OSA (obstructive sleep apnea) History of sleep apnea - Continue CPAP use  S/P prostatectomy Surgical incisions appear to be healing well.  - Continue oxybutynin - Norco and ibuprofen for pain control - Continue to monitor surgical incision sites - Continue to monitor for any bleeding  Chronic and stable:  HLD: continue Atorvastatin   FEN/GI: NPO  VTE Prophylaxis: Heparin gtt   Disposition: Progressive  History of Present Illness:  Jeffrey Griffin is a 61 y.o. male presenting with SOB since 4PM on 7/5. Around noon he had been having back pain as well- he thought this was related to his recent surgery.  Felt similar to previous PE and he noticed that his oxygen levels at home were dropping down to 78%. He had visible shortness of breath, so he presented to the Emergency Department. Had recent prostatectomy 1 week ago, has been off Eliquis since 1 week before the surgery. He is on Eliquis due to his history of atrial fibrillation and subsequent PE. Has had bladder spasms since the procedure- finally got some medications for that this week. Due to pain from surgery he has been mostly immobile.  He has been alternating Advil and Tylenol for the pain, only required Oxy one time but did not get much relief.   Feels that the SOB is still present, but not as bad as previously. He is having significant back pain now from the CT- states that he had to place his hands behind his head and he felt that he could not breathe. This was uncomfortable for him. Feels that he has a pulled  muscle, it hurts to take deep breaths.   In the ED, patient was hypoxic to 83% on 6L on arrival.  Patient was maxed on 10 L.  He became stable on 4L. CT Angiogram showed extensive bilateral pulmonary emboli with saddle pulmonary embolus.  Right ventricle distended suggesting underlying right heart strain as well as  distended pulmonary trunk consistent with pulmonary artery hypertension.  Aortic aneurysm dilation to 4.1 cm.  CBC with WBC elevated to 14, initial troponin 11, 46.  Consulted intensivist and IR.  Is not a good candidate for lytic therapy given recent prostatectomy.  Is candidate for thrombectomy. Patient started on heparin drip.  Patient is amenable to thrombectomy.  Review Of Systems: Per HPI with the following additions:  +SOB, back pain, b/l thigh pain  Neg lightheadedness, dizziness, palpitations   Pertinent Past Medical History: A-fib on Eliquis (off for past week, history of PE, hypertension, CHF, HLD, prostate cancer (status post prostatectomy), Aortic Aneurysm (stable 4.1 cm) Remainder reviewed in history tab.   Pertinent Past Surgical History: Prostatectomy 1 week ago Remainder reviewed in history tab.   Pertinent Social History: Tobacco use: No Alcohol use:  1 beer/month, if that Other Substance use: No Lives with wife  Pertinent Family History: Father: CVA  Brother: A-fib Sister: A-fib No family history of bleeding or clotting disorders   Remainder reviewed in history tab.   Important Outpatient Medications: Metoprolol succinate, Entresto, Eliquis, oxybutynin Remainder reviewed in medication history.   Objective: BP 128/67   Pulse 89   Temp 98.4 F (36.9 C) (Oral)   Resp (!) 24   Ht '5\' 9"'$  (1.753 m)   Wt 104.3 kg   SpO2 94%   BMI 33.97 kg/m  Exam: General: Uncomfortable appearing, slightly pale, pleasant Cardiovascular: Regular rate and rhythm, no murmurs or gallops, well-perfused, equal radial, dorsal pedal pulses Respiratory: Increased work of breathing on 4 L, shortness of breath with conversation, diminished breath sounds throughout left lung, present breath sounds right lung Gastrointestinal: Soft, nondistended, pain to palpation, 5 stapled surgical incisions, dry and healing well on lower abdomen GU: Foley in place Neuro: Alert and oriented, able to  converse  Labs:  CBC BMET  Recent Labs  Lab 03/08/22 1932  WBC 14.0*  HGB 13.7  HCT 43.5  PLT 204   Recent Labs  Lab 03/08/22 1932  NA 137  K 4.2  CL 103  CO2 22  BUN 20  CREATININE 0.84  GLUCOSE 115*  CALCIUM 9.8    Pertinent additional labs Troponin (11, 46)   EKG: My impression: Regular rate normal sinus rhythm, T wave inversion in lead III, left ventricular hypertrophy  Imaging Studies Performed:  Imaging Study (ie. Chest x-ray) Impression from Radiologist: Subtle opacity at the left lung base may represent atelectasis or developing infection.  New from previous imaging.  My Interpretation: Possible atelectasis or airspace disease in lower left lobe, anterior opacity in lower left lobe.  CT angio: Extensive bilateral pulmonary emboli with saddle pulmonary embolus.  Right ventricle is distended suggesting underlying right heart strain, distended pulmonary trunk which may be associated with underlying pulmonary artery hypertension.  Ascending aortic aneurysm dilatation to 4.1 cm.  Focal opacity in lingular segment of left upper lobe possible pulmonary infarct versus infectious or inflammatory process.  Trace bilateral pleural effusions  Lowry Ram, MD 03/09/2022, 2:16 AM PGY-1, [place] Intern pager: [phone], text pages welcome Secure chat group [place] [facility]  Resident Addendum   I have independently interviewed and examined the patient. I have discussed the above with the original author and agree with their documentation. My edits for correction/addition/clarification are included where appropriate. Please see also any attending notes.   Sharion Settler, DO PGY-3, [facility] 03/09/2022 2:51 AM  FPTS Service pager: [phone] (text pages welcome through [facility])

## 2022-03-09 NOTE — Progress Notes (Signed)
Patient was placed on our BIPAP as ordered by MD.  Patient requests to come off said not helping him and requested to go back on 100%NRB.

## 2022-03-09 NOTE — Procedures (Addendum)
Interventional Radiology Procedure Note  Procedure: Bilateral Pulmonary angiogram and thrombectomy  Indication: Submassive PE  Findings: Please refer to procedural dictation for full description.  Complications: None  EBL: 400 mL  Miachel Roux, MD [phone]

## 2022-03-09 NOTE — Anesthesia Preprocedure Evaluation (Addendum)
Anesthesia Evaluation  Patient identified by MRN, date of birth, ID band Patient awake  Preop documentation limited or incomplete due to emergent nature of procedure.  History of Anesthesia Complications Negative for: history of anesthetic complications  Airway Mallampati: Unable to assess       Dental   Pulmonary shortness of breath, sleep apnea and Continuous Positive Airway Pressure Ventilation , PE    + decreased breath sounds      Cardiovascular hypertension, +CHF   Rhythm:Regular Rate:Tachycardia  1. Left ventricular ejection fraction, by estimation, is 55%. The left  ventricle has normal function. The left ventricle has no regional wall  motion abnormalities. The left ventricular internal cavity size was mildly  dilated. There is mild concentric  left ventricular hypertrophy. Left ventricular diastolic parameters are  consistent with Grade II diastolic dysfunction (pseudonormalization).  2. Right ventricular systolic function is normal. The right ventricular  size is normal.  3. The mitral valve is normal in structure. No evidence of mitral valve  regurgitation. No evidence of mitral stenosis.  4. The aortic valve is normal in structure. Aortic valve regurgitation is  not visualized. No aortic stenosis is present.  5. There is mild dilatation of the ascending aorta, measuring 42 mm.  6. The inferior vena cava is normal in size with greater than 50%  respiratory variability, suggesting right atrial pressure of 3 mmHg.   ? Nuclear stress EF: 41%. ? There was no ST segment deviation noted during stress. ? Defect 1: There is a small defect of mild severity present in the apex location. This is likely due to apical thinning, though cannot rule out prior infarct. ? This is an intermediate risk study due to reduced systolic function. There is no ischemia. ? The left ventricular ejection fraction is moderately decreased  (30-44%).    Neuro/Psych    GI/Hepatic   Endo/Other    Renal/GU Lab Results      Component                Value               Date                      CREATININE               0.84                03/08/2022                Musculoskeletal   Abdominal   Peds  Hematology Lab Results      Component                Value               Date                      WBC                      11.6 (H)            03/09/2022                HGB                      13.4                03/09/2022  HCT                      41.5                03/09/2022                MCV                      93.3                03/09/2022                PLT                      198                 03/09/2022              Anesthesia Other Findings   Reproductive/Obstetrics                            Anesthesia Physical Anesthesia Plan  ASA: 5 and emergent  Anesthesia Plan: MAC   Post-op Pain Management:    Induction:   PONV Risk Score and Plan: 1 and Treatment may vary due to age or medical condition  Airway Management Planned:   Additional Equipment: Arterial line  Intra-op Plan:   Post-operative Plan:   Informed Consent: I have reviewed the patients History and Physical, chart, labs and discussed the procedure including the risks, benefits and alternatives for the proposed anesthesia with the patient or authorized representative who has indicated his/her understanding and acceptance.     History available from chart only  Plan Discussed with: CRNA and Surgeon  Anesthesia Plan Comments: (BiPAP)       Anesthesia Quick Evaluation

## 2022-03-10 ENCOUNTER — Encounter (HOSPITAL_COMMUNITY): Payer: Self-pay | Admitting: Radiology

## 2022-03-10 ENCOUNTER — Inpatient Hospital Stay (HOSPITAL_COMMUNITY): Payer: No Typology Code available for payment source

## 2022-03-10 DIAGNOSIS — I2602 Saddle embolus of pulmonary artery with acute cor pulmonale: Secondary | ICD-10-CM | POA: Diagnosis not present

## 2022-03-10 LAB — CBC
HCT: 35.3 % — ABNORMAL LOW (ref 39.0–52.0)
Hemoglobin: 11.7 g/dL — ABNORMAL LOW (ref 13.0–17.0)
MCH: 30 pg (ref 26.0–34.0)
MCHC: 33.1 g/dL (ref 30.0–36.0)
MCV: 90.5 fL (ref 80.0–100.0)
Platelets: 223 10*3/uL (ref 150–400)
RBC: 3.9 MIL/uL — ABNORMAL LOW (ref 4.22–5.81)
RDW: 12.3 % (ref 11.5–15.5)
WBC: 14.3 10*3/uL — ABNORMAL HIGH (ref 4.0–10.5)
nRBC: 0 % (ref 0.0–0.2)

## 2022-03-10 LAB — ECHOCARDIOGRAM COMPLETE
AR max vel: 2.25 cm2
AV Area VTI: 2.17 cm2
AV Area mean vel: 2.45 cm2
AV Mean grad: 8 mmHg
AV Peak grad: 14 mmHg
Ao pk vel: 1.87 m/s
Area-P 1/2: 3.42 cm2
Height: 69 in
S' Lateral: 3.3 cm
Weight: 3689.62 oz

## 2022-03-10 LAB — GLUCOSE, CAPILLARY: Glucose-Capillary: 142 mg/dL — ABNORMAL HIGH (ref 70–99)

## 2022-03-10 LAB — HEPARIN LEVEL (UNFRACTIONATED): Heparin Unfractionated: 0.23 IU/mL — ABNORMAL LOW (ref 0.30–0.70)

## 2022-03-10 MED ORDER — APIXABAN 5 MG PO TABS
10.0000 mg | ORAL_TABLET | Freq: Two times a day (BID) | ORAL | Status: DC
  Administered 2022-03-10 – 2022-03-14 (×9): 10 mg via ORAL
  Filled 2022-03-10 (×9): qty 2

## 2022-03-10 MED ORDER — METOPROLOL SUCCINATE ER 25 MG PO TB24
25.0000 mg | ORAL_TABLET | Freq: Every day | ORAL | Status: DC
  Administered 2022-03-10 – 2022-03-14 (×5): 25 mg via ORAL
  Filled 2022-03-10 (×5): qty 1

## 2022-03-10 MED ORDER — MAGIC MOUTHWASH
10.0000 mL | Freq: Every day | ORAL | Status: DC | PRN
  Administered 2022-03-10 – 2022-03-11 (×2): 10 mL via ORAL
  Filled 2022-03-10 (×3): qty 10

## 2022-03-10 MED ORDER — ACETAMINOPHEN 325 MG PO TABS
650.0000 mg | ORAL_TABLET | Freq: Four times a day (QID) | ORAL | Status: DC | PRN
  Administered 2022-03-10 – 2022-03-11 (×4): 650 mg via ORAL
  Filled 2022-03-10 (×3): qty 2

## 2022-03-10 MED ORDER — HEPARIN BOLUS VIA INFUSION
2000.0000 [IU] | Freq: Once | INTRAVENOUS | Status: AC
  Administered 2022-03-10: 2000 [IU] via INTRAVENOUS
  Filled 2022-03-10: qty 2000

## 2022-03-10 MED ORDER — APIXABAN 5 MG PO TABS: 5.0000 mg | ORAL_TABLET | Freq: Two times a day (BID) | ORAL | Status: DC

## 2022-03-10 NOTE — Progress Notes (Signed)
PCCM:  I spoke with [facility] who will pick patient up tomorrow 03/11/2022 7:00AM  Thanks  [facility] 03/10/2022 9:48 AM

## 2022-03-10 NOTE — Anesthesia Postprocedure Evaluation (Signed)
Anesthesia Post Note  Patient: Dalonte Hardage  Procedure(s) Performed: IR WITH ANESTHESIA     Patient location during evaluation: PACU Anesthesia Type: MAC Level of consciousness: awake and alert Pain management: pain level controlled Vital Signs Assessment: post-procedure vital signs reviewed and stable Respiratory status: spontaneous breathing, nonlabored ventilation, respiratory function stable and patient connected to nasal cannula oxygen Cardiovascular status: stable and blood pressure returned to baseline Postop Assessment: no apparent nausea or vomiting Anesthetic complications: no   No notable events documented.  Last Vitals:  Vitals:   03/10/22 1200 03/10/22 1520  BP:    Pulse: 66   Resp: (!) 24   Temp:  36.8 C  SpO2: 97%     Last Pain:  Vitals:   03/10/22 1520  TempSrc: Oral  PainSc:                  Tyreanna Bisesi

## 2022-03-10 NOTE — Evaluation (Signed)
Occupational Therapy Evaluation Patient Details Name: Jeffrey Griffin MRN: [id number] DOB: 17-Nov-1960 Today's Date: 03/10/2022   History of Present Illness 61 y.o. M admitted on 03/09/22 due to chest pain and shortness of breath. He was found to have a R sided saddle PE. PMH significant for HTN, HLD, A fib, CHF.   Clinical Impression   Pt admitted for concerns listed above. PTA pt reported that he was independent with all ADL's and IADL's. At this time, pt is limited by pain, requiring up to min A for ADL's and functional mobility. Anticipate that as pt's pain lessens he will progress his independence quickly. OT will continue to follow acutely.      Recommendations for follow up therapy are one component of a multi-disciplinary discharge planning process, led by the attending physician.  Recommendations may be updated based on patient status, additional functional criteria and insurance authorization.   Follow Up Recommendations  No OT follow up    Assistance Recommended at Discharge Set up Supervision/Assistance  Patient can return home with the following A little help with walking and/or transfers;A little help with bathing/dressing/bathroom    Functional Status Assessment  Patient has had a recent decline in their functional status and demonstrates the ability to make significant improvements in function in a reasonable and predictable amount of time.  Equipment Recommendations  None recommended by OT    Recommendations for Other Services       Precautions / Restrictions Precautions Precautions: Fall Restrictions Weight Bearing Restrictions: No      Mobility Bed Mobility Overal bed mobility: Needs Assistance Bed Mobility: Supine to Sit, Sit to Supine     Supine to sit: Mod assist Sit to supine: Min assist   General bed mobility comments: Mod A for trunk elevation, Min A for BLE management and increased time due to pain    Transfers Overall transfer level: Needs  assistance Equipment used: None Transfers: Sit to/from Stand Sit to Stand: Min assist           General transfer comment: Min A to power up and steady      Balance Overall balance assessment: Mild deficits observed, not formally tested                                         ADL either performed or assessed with clinical judgement   ADL Overall ADL's : Needs assistance/impaired Eating/Feeding: Set up;Sitting   Grooming: Set up;Sitting   Upper Body Bathing: Set up;Sitting   Lower Body Bathing: Minimal assistance;Sitting/lateral leans;Sit to/from stand   Upper Body Dressing : Set up;Sitting   Lower Body Dressing: Minimal assistance;Sitting/lateral leans;Sit to/from stand   Toilet Transfer: Minimal assistance;Ambulation   Toileting- Clothing Manipulation and Hygiene: Minimal assistance;Sitting/lateral lean;Sit to/from stand       Functional mobility during ADLs: Minimal assistance General ADL Comments: Limited by pain     Vision Baseline Vision/History: 0 No visual deficits Ability to See in Adequate Light: 0 Adequate Patient Visual Report: No change from baseline Vision Assessment?: No apparent visual deficits     Perception     Praxis      Pertinent Vitals/Pain Pain Assessment Pain Assessment: Faces Faces Pain Scale: Hurts whole lot Pain Location: R side of chest Pain Descriptors / Indicators: Aching, Discomfort, Grimacing, Guarding Pain Intervention(s): Limited activity within patient's tolerance, Monitored during session, Repositioned     Hand Dominance Right  Extremity/Trunk Assessment Upper Extremity Assessment Upper Extremity Assessment: Generalized weakness   Lower Extremity Assessment Lower Extremity Assessment: Generalized weakness   Cervical / Trunk Assessment Cervical / Trunk Assessment: Other exceptions Cervical / Trunk Exceptions: guarding his R side due to pain   Communication Communication Communication: No  difficulties   Cognition Arousal/Alertness: Awake/alert Behavior During Therapy: WFL for tasks assessed/performed Overall Cognitive Status: Within Functional Limits for tasks assessed                                       General Comments  VSS on 6L - reduced pt from 10L to 8L with no difficulties, then 8-6L and he continued to maintain above90%    Exercises     Shoulder Instructions      Home Living Family/patient expects to be discharged to:: Private residence Living Arrangements: Spouse/significant other Available Help at Discharge: Family;Available 24 hours/day Type of Home: House Home Access: Stairs to enter [employer] of Steps: 2 steps Entrance Stairs-Rails: None Home Layout: One level     Bathroom Shower/Tub: Teacher, early years/pre: Handicapped height     Home Equipment: None          Prior Functioning/Environment Prior Level of Function : Independent/Modified Independent;Working/employed;Driving             Mobility Comments: no AD ADLs Comments: Indep        OT Problem List: Decreased strength;Decreased activity tolerance;Impaired balance (sitting and/or standing);Cardiopulmonary status limiting activity      OT Treatment/Interventions: Self-care/ADL training;Therapeutic exercise;DME and/or AE instruction;Energy conservation;Therapeutic activities;Patient/family education;Balance training    OT Goals(Current goals can be found in the care plan section) Acute Rehab OT Goals Patient Stated Goal: To go home OT Goal Formulation: With patient Time For Goal Achievement: 03/24/22 Potential to Achieve Goals: Good ADL Goals Pt Will Perform Lower Body Bathing: with modified independence;sitting/lateral leans;sit to/from stand Pt Will Perform Lower Body Dressing: with modified independence;sitting/lateral leans;sit to/from stand Pt Will Transfer to Toilet: with modified independence;ambulating Pt Will Perform  Toileting - Clothing Manipulation and hygiene: with modified independence;sitting/lateral leans;sit to/from stand Additional ADL Goal #1: Pt will complete all bed mobility with supervision and less than 5/10 pain.  OT Frequency: Min 2X/week    Co-evaluation              AM-PAC OT "6 Clicks" Daily Activity     Outcome Measure Help from another person eating meals?: A Little Help from another person taking care of personal grooming?: A Little Help from another person toileting, which includes using toliet, bedpan, or urinal?: A Little Help from another person bathing (including washing, rinsing, drying)?: A Little Help from another person to put on and taking off regular upper body clothing?: A Little Help from another person to put on and taking off regular lower body clothing?: A Little 6 Click Score: 18   End of Session Equipment Utilized During Treatment: Oxygen Nurse Communication: Mobility status  Activity Tolerance: Patient tolerated treatment well Patient left: in bed;with call bell/phone within reach  OT Visit Diagnosis: Unsteadiness on feet (R26.81);Other abnormalities of gait and mobility (R26.89);Muscle weakness (generalized) (M62.81)                Time: 2979-8921 OT Time Calculation (min): 25 min Charges:  OT General Charges $OT Visit: 1 Visit OT Evaluation $OT Eval Moderate Complexity: 1 Mod  Lash Matulich H., OTR/L Acute Rehabilitation  Viktorya Arguijo Elane Chanley Mcenery 03/10/2022, 5:57 PM

## 2022-03-10 NOTE — Progress Notes (Signed)
  Echocardiogram 2D Echocardiogram has been performed.  Jeffrey Griffin 03/10/2022, 9:35 AM

## 2022-03-10 NOTE — Progress Notes (Signed)
Referring Physician(s): Dr. June Leap  Supervising Physician: Sandi Mariscal  Patient Status:  [facility] - In-pt  Chief Complaint: Pulmonary embolus  Subjective: Patient s/p mechanical thrombectomy yesterday afternoon. Remains on 10L nasal cannula but states he feels his breathing is improving.   Allergies: Patient has no known allergies.  Medications: Prior to Admission medications   Medication Sig Start Date End Date Taking? Authorizing Provider  acetaminophen (TYLENOL) 500 MG tablet Take 500 mg by mouth in the morning, at noon, and at bedtime.   Yes [provider]  apixaban (ELIQUIS) 5 MG TABS tablet TAKE 1 TABLET BY MOUTH TWICE A DAY Patient taking differently: Take 5 mg by mouth 2 (two) times daily. 08/15/21  Yes Allred, Jeneen Rinks, MD  atorvastatin (LIPITOR) 40 MG tablet TAKE 1 TABLET BY MOUTH EVERY DAY Patient taking differently: Take 40 mg by mouth daily. 11/29/21  Yes Nahser, Wonda Cheng, MD  Carnitine-B5-B6 500-15-5 MG TABS Take 1 tablet by mouth daily.   Yes [provider]  Cholecalciferol (VITAMIN D3) 25 MCG (1000 UT) CAPS Take 1,000 Units by mouth daily.    Yes [provider]  Coenzyme Q10 300 MG CAPS Take 1 capsule by mouth daily.   Yes [provider]  Cyanocobalamin (B-12) 5000 MCG CAPS Take 1 capsule by mouth daily.   Yes [provider]  diclofenac Sodium (VOLTAREN) 1 % GEL Apply 4 g topically 4 (four) times daily. Patient taking differently: Apply 4 g topically daily as needed (For pain). 01/28/21  Yes Lilland, Alana, DO  Garlic 0973 MG CAPS Take 1 capsule by mouth daily.   Yes [provider]  Glucosamine-Chondroitin (COSAMIN DS PO) Take 2 tablets by mouth daily.   Yes [provider]  Hawthorn 150 MG CAPS Take 1 capsule by mouth daily at 6 (six) AM.   Yes [provider]  ibuprofen (ADVIL) 200 MG tablet Take 200 mg by mouth every 6 (six) hours as needed for mild pain.   Yes [provider]   meclizine (ANTIVERT) 25 MG tablet TAKE 1 TABLET BY MOUTH 3 TIMES DAILY AS NEEDED FOR DIZZINESS. Patient taking differently: Take 25 mg by mouth 3 (three) times daily as needed for dizziness. 09/07/21  Yes Lilland, Alana, DO  metoprolol succinate (TOPROL-XL) 50 MG 24 hr tablet TAKE 1 TABLET BY MOUTH DAILY. TAKE WITH OR IMMEDIATELY FOLLOWING A MEAL. Patient taking differently: Take 50 mg by mouth daily. 10/06/21  Yes Nahser, Wonda Cheng, MD  Multiple Minerals-Vitamins (CAL MAG ZINC +D3 PO) Take 1 tablet by mouth daily.    Yes [provider]  Multiple Vitamin (MULTIVITAMIN WITH MINERALS) TABS tablet Take 1 tablet by mouth daily.   Yes [provider]  Omega-3 1400 MG CAPS Take 1 capsule by mouth daily.   Yes [provider]  oxybutynin (DITROPAN) 5 MG tablet Take 5 mg by mouth 3 (three) times daily. 03/06/22  Yes [provider]  sacubitril-valsartan (ENTRESTO) 97-103 MG Take 1 tablet by mouth 2 (two) times daily. 12/05/21  Yes Nahser, Wonda Cheng, MD  Taurine 1000 MG CAPS Take 1 capsule by mouth daily.   Yes [provider]  vitamin E 180 MG (400 UNITS) capsule Take 400 Units by mouth daily.   Yes [provider]  Zn-Pyg Afri-Nettle-Saw Palmet (SAW PALMETTO COMPLEX PO) Take 1 tablet by mouth daily.   Yes [provider]     Vital Signs: BP (!) 166/57   Pulse 66   Temp 98.8 F (  37.1 C) (Oral)   Resp (!) 24   Ht '5\' 9"'$  (1.753 m)   Wt 230 lb 9.6 oz (104.6 kg)   SpO2 97%   BMI 34.05 kg/m   Physical Exam NAD, alert, sitting up in bed On nasal cannula with 10L Skin: groin procedure site intact.  Dressing clean and dry.  Stitch in place.  No erythema or warmth.   Imaging: ECHOCARDIOGRAM COMPLETE  Result Date: 03/10/2022    ECHOCARDIOGRAM REPORT   Patient Name:   Jeffrey Griffin Date of Exam: 03/10/2022 Medical Rec #:  [id number]     Height:       69.0 in Accession #:    [id number]    Weight:       230.6 lb Date of Birth:  17-Sep-1960     BSA:           2.195 m Patient Age:    61 years      BP:           151/76 mmHg Patient Gender: M             HR:           73 bpm. Exam Location:  Inpatient Procedure: 2D Echo, Cardiac Doppler and Color Doppler Indications:    Pulmonary embolus  History:        Patient has prior history of Echocardiogram examinations, most                 recent 10/22/2020. CHF; Arrythmias:Atrial Fibrillation.  Sonographer:    Joette Catching RCS Referring Phys: [place]  1. Left ventricular ejection fraction, by estimation, is 55 to 60%. The left ventricle has normal function. The left ventricle has no regional wall motion abnormalities. There is moderate left ventricular hypertrophy. Left ventricular diastolic parameters were normal.  2. Right ventricular systolic function is normal. The right ventricular size is normal. Tricuspid regurgitation signal is inadequate for assessing PA pressure.  3. The mitral valve is normal in structure. No evidence of mitral valve regurgitation. No evidence of mitral stenosis.  4. The aortic valve is tricuspid. Aortic valve regurgitation is not visualized. No aortic stenosis is present.  5. Aortic dilatation noted. There is dilatation of the aortic root, measuring 42 mm. There is dilatation of the ascending aorta, measuring 41 mm.  6. The inferior vena cava is normal in size with greater than 50% respiratory variability, suggesting right atrial pressure of 3 mmHg. FINDINGS  Left Ventricle: Left ventricular ejection fraction, by estimation, is 55 to 60%. The left ventricle has normal function. The left ventricle has no regional wall motion abnormalities. The left ventricular internal cavity size was normal in size. There is  moderate left ventricular hypertrophy. Left ventricular diastolic parameters were normal. Right Ventricle: The right ventricular size is normal. No increase in right ventricular wall thickness. Right ventricular systolic function is normal. Tricuspid  regurgitation signal is inadequate for assessing PA pressure. Left Atrium: Left atrial size was normal in size. Right Atrium: Right atrial size was normal in size. Pericardium: There is no evidence of pericardial effusion. Mitral Valve: The mitral valve is normal in structure. No evidence of mitral valve regurgitation. No evidence of mitral valve stenosis. Tricuspid Valve: The tricuspid valve is normal in structure. Tricuspid valve regurgitation is trivial. Aortic Valve: The aortic valve is tricuspid. Aortic valve regurgitation is not visualized. No aortic stenosis is present. Aortic valve mean gradient measures 8.0 mmHg. Aortic valve peak gradient measures 14.0 mmHg. Aortic  valve area, by VTI measures 2.17  cm. Pulmonic Valve: The pulmonic valve was not well visualized. Pulmonic valve regurgitation is not visualized. Aorta: Aortic dilatation noted. There is dilatation of the aortic root, measuring 42 mm. There is dilatation of the ascending aorta, measuring 41 mm. Venous: The inferior vena cava is normal in size with greater than 50% respiratory variability, suggesting right atrial pressure of 3 mmHg. IAS/Shunts: The interatrial septum was not well visualized.  LEFT VENTRICLE PLAX 2D LVIDd:         4.70 cm   Diastology LVIDs:         3.30 cm   LV e' medial:    9.57 cm/s LV PW:         1.20 cm   LV E/e' medial:  6.8 LV IVS:        1.20 cm   LV e' lateral:   10.90 cm/s LVOT diam:     2.00 cm   LV E/e' lateral: 6.0 LV SV:         80 LV SV Index:   37 LVOT Area:     3.14 cm  RIGHT VENTRICLE             IVC RV Basal diam:  2.90 cm     IVC diam: 1.70 cm RV Mid diam:    1.40 cm RV S prime:     17.50 cm/s TAPSE (M-mode): 1.8 cm LEFT ATRIUM             Index        RIGHT ATRIUM           Index LA diam:        3.40 cm 1.55 cm/m   RA Area:     15.30 cm LA Vol (A2C):   48.7 ml 22.19 ml/m  RA Volume:   32.00 ml  14.58 ml/m LA Vol (A4C):   82.6 ml 37.63 ml/m LA Biplane Vol: 69.2 ml 31.53 ml/m  AORTIC VALVE                      PULMONIC VALVE AV Area (Vmax):    2.25 cm      PV Vmax:       1.48 m/s AV Area (Vmean):   2.45 cm      PV Peak grad:  8.8 mmHg AV Area (VTI):     2.17 cm AV Vmax:           187.00 cm/s AV Vmean:          132.000 cm/s AV VTI:            0.370 m AV Peak Grad:      14.0 mmHg AV Mean Grad:      8.0 mmHg LVOT Vmax:         134.00 cm/s LVOT Vmean:        103.000 cm/s LVOT VTI:          0.256 m LVOT/AV VTI ratio: 0.69  AORTA Ao Root diam: 4.20 cm Ao Asc diam:  4.10 cm MITRAL VALVE MV Area (PHT): 3.42 cm    SHUNTS MV Decel Time: 222 msec    Systemic VTI:  0.26 m MV E velocity: 65.50 cm/s  Systemic Diam: 2.00 cm MV A velocity: 43.70 cm/s MV E/A ratio:  1.50 Oswaldo Milian MD Electronically signed by Oswaldo Milian MD Signature Date/Time: 03/10/2022/1:58:35 PM    Final    VAS [place] LOWER EXTREMITY VENOUS (DVT)  Result Date: 03/09/2022  Lower Venous DVT Study Patient Name:  REFUJIO HAYMER  Date of Exam:   03/09/2022 Medical Rec #: [id number]      Accession #:    [id number] Date of Birth: 03/22/1961      Patient Gender: M Patient Age:   64 years Exam Location:  [facility] Procedure:      VAS [place] LOWER EXTREMITY VENOUS (DVT) Referring Phys: MARSHALL CHAMBLISS --------------------------------------------------------------------------------  Indications: Pulmonary embolism.  Risk Factors: Confirmed PE. Anticoagulation: Heparin. Comparison Study: No prior studies. Performing Technologist: Oliver Hum RVT  Examination Guidelines: A complete evaluation includes B-mode imaging, spectral Doppler, color Doppler, and power Doppler as needed of all accessible portions of each vessel. Bilateral testing is considered an integral part of a complete examination. Limited examinations for reoccurring indications may be performed as noted. The reflux portion of the exam is performed with the patient in reverse Trendelenburg.  +---------+---------------+---------+-----------+----------+--------------+ RIGHT     CompressibilityPhasicitySpontaneityPropertiesThrombus Aging +---------+---------------+---------+-----------+----------+--------------+ CFV      Full           Yes      Yes                                 +---------+---------------+---------+-----------+----------+--------------+ SFJ      Full                                                        +---------+---------------+---------+-----------+----------+--------------+ FV Prox  Full                                                        +---------+---------------+---------+-----------+----------+--------------+ FV Mid   Full                                                        +---------+---------------+---------+-----------+----------+--------------+ FV DistalFull                                                        +---------+---------------+---------+-----------+----------+--------------+ PFV      Full                                                        +---------+---------------+---------+-----------+----------+--------------+ POP      Full           Yes      Yes                                 +---------+---------------+---------+-----------+----------+--------------+ PTV      Partial  Acute          +---------+---------------+---------+-----------+----------+--------------+ PERO     Full                                                        +---------+---------------+---------+-----------+----------+--------------+   +---------+---------------+---------+-----------+----------+--------------+ LEFT     CompressibilityPhasicitySpontaneityPropertiesThrombus Aging +---------+---------------+---------+-----------+----------+--------------+ CFV      Full           Yes      Yes                                 +---------+---------------+---------+-----------+----------+--------------+ SFJ      Full                                                         +---------+---------------+---------+-----------+----------+--------------+ FV Prox  Full                                                        +---------+---------------+---------+-----------+----------+--------------+ FV Mid   Full                                                        +---------+---------------+---------+-----------+----------+--------------+ FV DistalFull                                                        +---------+---------------+---------+-----------+----------+--------------+ PFV      Full                                                        +---------+---------------+---------+-----------+----------+--------------+ POP      Full           Yes      Yes                                 +---------+---------------+---------+-----------+----------+--------------+ PTV      None                                         Acute          +---------+---------------+---------+-----------+----------+--------------+ PERO     Full                                                        +---------+---------------+---------+-----------+----------+--------------+  Summary: RIGHT: - Findings consistent with acute deep vein thrombosis involving the right posterior tibial veins. - No cystic structure found in the popliteal fossa.  LEFT: - Findings consistent with acute deep vein thrombosis involving the left posterior tibial veins. - No cystic structure found in the popliteal fossa.  *See table(s) above for measurements and observations. Electronically signed by Jamelle Haring on 03/09/2022 at 5:43:14 PM.    Final    IR US Guide Vasc Access Right  Result Date: 03/09/2022 INDICATION: 61 year old gentleman with submassive pulmonary artery embolism presents to IR for angiogram and thrombectomy. Patient has elevated troponin and very short of breath requiring increasing oxygen support. EXAM: 1. Ultrasound-guided access of right common femoral  vein. 2. Bilateral pulmonary angiogram and suction thrombectomy. COMPARISON:  None Available. MEDICATIONS: None. ANESTHESIA/SEDATION: Moderate sedation performed and monitored by the anesthesia team. FLUOROSCOPY TIME:  Radiation Exposure Index (as provided by the fluoroscopic device): 725 mGy Kerma COMPLICATIONS: None immediate. TECHNIQUE: Informed written consent was obtained from the patient and his wife after a thorough discussion of the procedural risks, benefits and alternatives. All questions were addressed. Maximal Sterile Barrier Technique was utilized including caps, mask, sterile gowns, sterile gloves, sterile drape, hand hygiene and skin antiseptic. A timeout was performed prior to the initiation of the procedure. Patient positioned supine on the procedure table. The right groin skin prepped and draped in usual fashion. Ultrasound image documenting patency of the right common femoral vein was obtained and placed in permanent medical record. Sterile ultrasound probe cover and gel utilized throughout the procedure. Utilizing continuous ultrasound guidance, the right common femoral vein was accessed at the level of the femoral head with a 21 gauge needle. 21 gauge needle exchanged for a transitional dilator set over 0.018 inch guidewire. Transitional dilator set exchanged for 6 French sheath over 0.035 inch guidewire. Angled pigtail catheter successfully advanced to the main pulmonary artery lies in fluoroscopic guidance. Pulmonary angiogram was performed demonstrating bilateral emboli. Main pulmonary artery pressure was measured: 62/10 (29). The left main pulmonary artery was selected with 5 French catheter. Exchange length Amplatz guidewire was advanced into a left lower lobe branch. 6 French right groin sheath exchanged for 16 [place] Gore dry seal sheath. 16 French penumbra suction device advanced to the left lower lobe pulmonary artery and suction thrombectomy was performed removing clot. The catheter  was then redirected into the left upper lobe and additional thrombus was removed through suction thrombectomy. The suction catheter was retracted to the main pulmonary artery. The right lower lobe pulmonary artery was selected with 5 French catheter. Amplatz guidewire was advanced into the right lower lobe pulmonary artery followed by suction catheter. Additional suction thrombectomy was performed in the right lower lobe, followed by the right upper lobe, removing additional clot. Post thrombectomy angiogram showed decrease in overall clot burden. Final pulmonary artery pressure: 67/16 (36). Suction catheter and sheath removed and hemostasis achieved with pursestring suture and 10 minutes of manual compression. FINDINGS: Bilateral pulmonary artery embolism. IMPRESSION: Successful bilateral pulmonary artery suction embolectomy. Electronically Signed   By: Miachel Roux M.D.   On: 03/09/2022 17:16   IR Angiogram Pulmonary Bilateral Selective  Result Date: 03/09/2022 INDICATION: 61 year old gentleman with submassive pulmonary artery embolism presents to IR for angiogram and thrombectomy. Patient has elevated troponin and very short of breath requiring increasing oxygen support. EXAM: 1. Ultrasound-guided access of right common femoral vein. 2. Bilateral pulmonary angiogram and suction thrombectomy. COMPARISON:  None Available. MEDICATIONS: None. ANESTHESIA/SEDATION: Moderate sedation performed and monitored  by the anesthesia team. FLUOROSCOPY TIME:  Radiation Exposure Index (as provided by the fluoroscopic device): 332 mGy Kerma COMPLICATIONS: None immediate. TECHNIQUE: Informed written consent was obtained from the patient and his wife after a thorough discussion of the procedural risks, benefits and alternatives. All questions were addressed. Maximal Sterile Barrier Technique was utilized including caps, mask, sterile gowns, sterile gloves, sterile drape, hand hygiene and skin antiseptic. A timeout was performed  prior to the initiation of the procedure. Patient positioned supine on the procedure table. The right groin skin prepped and draped in usual fashion. Ultrasound image documenting patency of the right common femoral vein was obtained and placed in permanent medical record. Sterile ultrasound probe cover and gel utilized throughout the procedure. Utilizing continuous ultrasound guidance, the right common femoral vein was accessed at the level of the femoral head with a 21 gauge needle. 21 gauge needle exchanged for a transitional dilator set over 0.018 inch guidewire. Transitional dilator set exchanged for 6 French sheath over 0.035 inch guidewire. Angled pigtail catheter successfully advanced to the main pulmonary artery lies in fluoroscopic guidance. Pulmonary angiogram was performed demonstrating bilateral emboli. Main pulmonary artery pressure was measured: 62/10 (29). The left main pulmonary artery was selected with 5 French catheter. Exchange length Amplatz guidewire was advanced into a left lower lobe branch. 6 French right groin sheath exchanged for 16 [place] Gore dry seal sheath. 16 French penumbra suction device advanced to the left lower lobe pulmonary artery and suction thrombectomy was performed removing clot. The catheter was then redirected into the left upper lobe and additional thrombus was removed through suction thrombectomy. The suction catheter was retracted to the main pulmonary artery. The right lower lobe pulmonary artery was selected with 5 French catheter. Amplatz guidewire was advanced into the right lower lobe pulmonary artery followed by suction catheter. Additional suction thrombectomy was performed in the right lower lobe, followed by the right upper lobe, removing additional clot. Post thrombectomy angiogram showed decrease in overall clot burden. Final pulmonary artery pressure: 67/16 (36). Suction catheter and sheath removed and hemostasis achieved with pursestring suture and 10  minutes of manual compression. FINDINGS: Bilateral pulmonary artery embolism. IMPRESSION: Successful bilateral pulmonary artery suction embolectomy. Electronically Signed   By: Miachel Roux M.D.   On: 03/09/2022 17:16   IR Angiogram Selective Each Additional Vessel  Result Date: 03/09/2022 INDICATION: 61 year old gentleman with submassive pulmonary artery embolism presents to IR for angiogram and thrombectomy. Patient has elevated troponin and very short of breath requiring increasing oxygen support. EXAM: 1. Ultrasound-guided access of right common femoral vein. 2. Bilateral pulmonary angiogram and suction thrombectomy. COMPARISON:  None Available. MEDICATIONS: None. ANESTHESIA/SEDATION: Moderate sedation performed and monitored by the anesthesia team. FLUOROSCOPY TIME:  Radiation Exposure Index (as provided by the fluoroscopic device): 951 mGy Kerma COMPLICATIONS: None immediate. TECHNIQUE: Informed written consent was obtained from the patient and his wife after a thorough discussion of the procedural risks, benefits and alternatives. All questions were addressed. Maximal Sterile Barrier Technique was utilized including caps, mask, sterile gowns, sterile gloves, sterile drape, hand hygiene and skin antiseptic. A timeout was performed prior to the initiation of the procedure. Patient positioned supine on the procedure table. The right groin skin prepped and draped in usual fashion. Ultrasound image documenting patency of the right common femoral vein was obtained and placed in permanent medical record. Sterile ultrasound probe cover and gel utilized throughout the procedure. Utilizing continuous ultrasound guidance, the right common femoral vein was accessed at the level of  the femoral head with a 21 gauge needle. 21 gauge needle exchanged for a transitional dilator set over 0.018 inch guidewire. Transitional dilator set exchanged for 6 French sheath over 0.035 inch guidewire. Angled pigtail catheter  successfully advanced to the main pulmonary artery lies in fluoroscopic guidance. Pulmonary angiogram was performed demonstrating bilateral emboli. Main pulmonary artery pressure was measured: 62/10 (29). The left main pulmonary artery was selected with 5 French catheter. Exchange length Amplatz guidewire was advanced into a left lower lobe branch. 6 French right groin sheath exchanged for 16 [place] Gore dry seal sheath. 16 French penumbra suction device advanced to the left lower lobe pulmonary artery and suction thrombectomy was performed removing clot. The catheter was then redirected into the left upper lobe and additional thrombus was removed through suction thrombectomy. The suction catheter was retracted to the main pulmonary artery. The right lower lobe pulmonary artery was selected with 5 French catheter. Amplatz guidewire was advanced into the right lower lobe pulmonary artery followed by suction catheter. Additional suction thrombectomy was performed in the right lower lobe, followed by the right upper lobe, removing additional clot. Post thrombectomy angiogram showed decrease in overall clot burden. Final pulmonary artery pressure: 67/16 (36). Suction catheter and sheath removed and hemostasis achieved with pursestring suture and 10 minutes of manual compression. FINDINGS: Bilateral pulmonary artery embolism. IMPRESSION: Successful bilateral pulmonary artery suction embolectomy. Electronically Signed   By: Miachel Roux M.D.   On: 03/09/2022 17:16   IR Angiogram Selective Each Additional Vessel  Result Date: 03/09/2022 INDICATION: 61 year old gentleman with submassive pulmonary artery embolism presents to IR for angiogram and thrombectomy. Patient has elevated troponin and very short of breath requiring increasing oxygen support. EXAM: 1. Ultrasound-guided access of right common femoral vein. 2. Bilateral pulmonary angiogram and suction thrombectomy. COMPARISON:  None Available. MEDICATIONS: None.  ANESTHESIA/SEDATION: Moderate sedation performed and monitored by the anesthesia team. FLUOROSCOPY TIME:  Radiation Exposure Index (as provided by the fluoroscopic device): 606 mGy Kerma COMPLICATIONS: None immediate. TECHNIQUE: Informed written consent was obtained from the patient and his wife after a thorough discussion of the procedural risks, benefits and alternatives. All questions were addressed. Maximal Sterile Barrier Technique was utilized including caps, mask, sterile gowns, sterile gloves, sterile drape, hand hygiene and skin antiseptic. A timeout was performed prior to the initiation of the procedure. Patient positioned supine on the procedure table. The right groin skin prepped and draped in usual fashion. Ultrasound image documenting patency of the right common femoral vein was obtained and placed in permanent medical record. Sterile ultrasound probe cover and gel utilized throughout the procedure. Utilizing continuous ultrasound guidance, the right common femoral vein was accessed at the level of the femoral head with a 21 gauge needle. 21 gauge needle exchanged for a transitional dilator set over 0.018 inch guidewire. Transitional dilator set exchanged for 6 French sheath over 0.035 inch guidewire. Angled pigtail catheter successfully advanced to the main pulmonary artery lies in fluoroscopic guidance. Pulmonary angiogram was performed demonstrating bilateral emboli. Main pulmonary artery pressure was measured: 62/10 (29). The left main pulmonary artery was selected with 5 French catheter. Exchange length Amplatz guidewire was advanced into a left lower lobe branch. 6 French right groin sheath exchanged for 16 [place] Gore dry seal sheath. 16 French penumbra suction device advanced to the left lower lobe pulmonary artery and suction thrombectomy was performed removing clot. The catheter was then redirected into the left upper lobe and additional thrombus was removed through suction thrombectomy. The  suction catheter  was retracted to the main pulmonary artery. The right lower lobe pulmonary artery was selected with 5 French catheter. Amplatz guidewire was advanced into the right lower lobe pulmonary artery followed by suction catheter. Additional suction thrombectomy was performed in the right lower lobe, followed by the right upper lobe, removing additional clot. Post thrombectomy angiogram showed decrease in overall clot burden. Final pulmonary artery pressure: 67/16 (36). Suction catheter and sheath removed and hemostasis achieved with pursestring suture and 10 minutes of manual compression. FINDINGS: Bilateral pulmonary artery embolism. IMPRESSION: Successful bilateral pulmonary artery suction embolectomy. Electronically Signed   By: Miachel Roux M.D.   On: 03/09/2022 17:16   IR THROMBECT PRIM MECH INIT (INCLU) MOD SED  Result Date: 03/09/2022 INDICATION: 61 year old gentleman with submassive pulmonary artery embolism presents to IR for angiogram and thrombectomy. Patient has elevated troponin and very short of breath requiring increasing oxygen support. EXAM: 1. Ultrasound-guided access of right common femoral vein. 2. Bilateral pulmonary angiogram and suction thrombectomy. COMPARISON:  None Available. MEDICATIONS: None. ANESTHESIA/SEDATION: Moderate sedation performed and monitored by the anesthesia team. FLUOROSCOPY TIME:  Radiation Exposure Index (as provided by the fluoroscopic device): 983 mGy Kerma COMPLICATIONS: None immediate. TECHNIQUE: Informed written consent was obtained from the patient and his wife after a thorough discussion of the procedural risks, benefits and alternatives. All questions were addressed. Maximal Sterile Barrier Technique was utilized including caps, mask, sterile gowns, sterile gloves, sterile drape, hand hygiene and skin antiseptic. A timeout was performed prior to the initiation of the procedure. Patient positioned supine on the procedure table. The right groin skin  prepped and draped in usual fashion. Ultrasound image documenting patency of the right common femoral vein was obtained and placed in permanent medical record. Sterile ultrasound probe cover and gel utilized throughout the procedure. Utilizing continuous ultrasound guidance, the right common femoral vein was accessed at the level of the femoral head with a 21 gauge needle. 21 gauge needle exchanged for a transitional dilator set over 0.018 inch guidewire. Transitional dilator set exchanged for 6 French sheath over 0.035 inch guidewire. Angled pigtail catheter successfully advanced to the main pulmonary artery lies in fluoroscopic guidance. Pulmonary angiogram was performed demonstrating bilateral emboli. Main pulmonary artery pressure was measured: 62/10 (29). The left main pulmonary artery was selected with 5 French catheter. Exchange length Amplatz guidewire was advanced into a left lower lobe branch. 6 French right groin sheath exchanged for 16 [place] Gore dry seal sheath. 16 French penumbra suction device advanced to the left lower lobe pulmonary artery and suction thrombectomy was performed removing clot. The catheter was then redirected into the left upper lobe and additional thrombus was removed through suction thrombectomy. The suction catheter was retracted to the main pulmonary artery. The right lower lobe pulmonary artery was selected with 5 French catheter. Amplatz guidewire was advanced into the right lower lobe pulmonary artery followed by suction catheter. Additional suction thrombectomy was performed in the right lower lobe, followed by the right upper lobe, removing additional clot. Post thrombectomy angiogram showed decrease in overall clot burden. Final pulmonary artery pressure: 67/16 (36). Suction catheter and sheath removed and hemostasis achieved with pursestring suture and 10 minutes of manual compression. FINDINGS: Bilateral pulmonary artery embolism. IMPRESSION: Successful bilateral  pulmonary artery suction embolectomy. Electronically Signed   By: Miachel Roux M.D.   On: 03/09/2022 17:16   CT Angio Chest PE W and/or Wo Contrast  Result Date: 03/08/2022 CLINICAL DATA:  Pulmonary embolism suspected, high probability. Sharp chest pain. EXAM: CT  ANGIOGRAPHY CHEST WITH CONTRAST TECHNIQUE: Multidetector CT imaging of the chest was performed using the standard protocol during bolus administration of intravenous contrast. Multiplanar CT image reconstructions and MIPs were obtained to evaluate the vascular anatomy. RADIATION DOSE REDUCTION: This exam was performed according to the departmental dose-optimization program which includes automated exposure control, adjustment of the mA and/or kV according to patient size and/or use of iterative reconstruction technique. CONTRAST:  57m OMNIPAQUE IOHEXOL 350 MG/ML SOLN COMPARISON:  01/17/2022. FINDINGS: Cardiovascular: The heart is borderline enlarged and there is no pericardial effusion. There is aneurysmal dilatation of the ascending aorta measuring 4.1 cm. The pulmonary trunk is distended suggesting underlying pulmonary artery hypertension. There are extensive bilateral pulmonary emboli with a saddle embolus. Evaluation of the segmental and subsegmental arteries is limited due to suboptimal opacification. The right ventricle is distended suggesting right heart strain. Mediastinum/Nodes: No mediastinal, hilar, or axillary lymphadenopathy. Thyroid gland, trachea, and esophagus are within normal limits. There is a small hiatal hernia. Lungs/Pleura: Strandy atelectasis or infiltrate is noted at the lung bases. There is focal consolidation in the lingular segment of the left upper lobe. Trace bilateral pleural effusions are noted. No pneumothorax. Upper Abdomen: No acute abnormality. Musculoskeletal: Degenerative changes are present in the thoracic spine. No acute osseous abnormality is seen. Review of the MIP images confirms the above findings. IMPRESSION:  1. Extensive bilateral pulmonary emboli with saddle pulmonary embolus. The right ventricle is distended suggesting underlying right heart strain. 2. Distended pulmonary trunk which may be associated with underlying pulmonary artery hypertension. 3. Aneurysmal dilatation of the ascending aorta measuring 4.1 cm. Recommend annual imaging followup by CTA or MRA. This recommendation follows 2010 ACCF/AHA/AATS/ACR/ASA/SCA/SCAI/SIR/STS/SVM Guidelines for the Diagnosis and Management of Patients with Thoracic Aortic Disease. Circulation. 2010; 121:: T654-Y503 Aortic aneurysm NOS (ICD10-I71.9) 4. Strandy atelectasis at the lung bases. There is a focal opacity in the lingular segment of the left upper lobe, possible pulmonary infarct versus infectious or inflammatory process. 5. Trace bilateral pleural effusions. Critical findings were reported to Dr. JIsla Penceat 11:07 p.m. Electronically Signed   By: LBrett FairyM.D.   On: 03/08/2022 23:09   DG Chest 2 View  Result Date: 03/08/2022 CLINICAL DATA:  61year old male presenting with shortness of breath. EXAM: CHEST - 2 VIEW COMPARISON:  September 15, 2019. FINDINGS: Trachea midline. Cardiomediastinal contours and hilar structures are normal. Subtle opacity at the LEFT lung base. No lobar consolidation. No sign of pleural effusion. Opacity just lateral to the costodiaphragmatic sulcus. No pneumothorax. On limited assessment there is no acute skeletal finding. IMPRESSION: Subtle opacity at the LEFT lung base may represent atelectasis or developing infection. New from previous imaging. Electronically Signed   By: GZetta BillsM.D.   On: 03/08/2022 20:02    Labs:  CBC: Recent Labs    07/13/21 0826 03/08/22 1932 03/09/22 0425 03/10/22 0515  WBC 6.4 14.0* 11.6* 14.3*  HGB 15.3 13.7 13.4 11.7*  HCT 45.9 43.5 41.5 35.3*  PLT 244 204 198 223    COAGS: No results for input(s): "INR", "APTT" in the last 8760 hours.  BMP: Recent Labs    03/28/21 2025  07/13/21 0826 12/26/21 0723 03/08/22 1932  NA 138 139 138 137  K 4.1 4.2 4.2 4.2  CL 104 102 102 103  CO2 '27 26 24 22  '$ GLUCOSE 99 105* 122* 115*  BUN '8 20 14 20  '$ CALCIUM 10.2 10.6* 10.5* 9.8  CREATININE 0.90 0.92 1.10 0.84  GFRNONAA >60  --   --  >  60    LIVER FUNCTION TESTS: Recent Labs    03/28/21 2025 03/08/22 1932  BILITOT 0.9 0.9  AST 28 26  ALT 29 30  ALKPHOS 75 76  PROT 7.1 6.5  ALBUMIN 4.0 2.9*    Assessment and Plan: Pulmonary embolus Patient s/p mehcanical thrombectomy 03/09/22 by Dr. Dwaine Gale.  Reports improved working of breathing this AM.  Has been able to transition off 15L non-rebreather to 10 L nasal cannula.  Groin stitch removed at bedside without complication. No oozing.  Dressing replaced.  IR remains available as needed.   Electronically Signed: Docia Barrier, PA 03/10/2022, 2:57 PM   I spent a total of 25 Minutes at the the patient's bedside AND on the patient's hospital floor or unit, greater than 50% of which was counseling/coordinating care for pulmonary embolus.

## 2022-03-10 NOTE — Progress Notes (Signed)
NAME:  Jeffrey Griffin, MRN:  [id number], DOB:  1961-03-14, LOS: 1 ADMISSION DATE:  03/08/2022, CONSULTATION DATE: 03/08/2022 REFERRING MD: Dr. Gilford Raid, CHIEF COMPLAINT: Pulmonary embolism  History of Present Illness:  61 year old with paroxysmal atrial fibrillation hypertension and hyperlipidemia who presented with chest pain and shortness of breath, noted to have saddle PE with right-sided. Patient was hypoxic requiring oxygen, not a candidate for systemic thrombolysis due to recent prostatectomy on 03/01/2022 patient presented with chest discomfort, shortness of breath. Noted to use oxygen levels were in the 80s at home when he checked his oxygen. Of note patient is off Eliquis until his catheter comes out on 7/10.   Pertinent  Medical History   Past Medical History:  Diagnosis Date   Abnormality of thoracic aorta    4.3 CM ECTATIC ASENDING    Cardiopathy    CHF (congestive heart failure) (HCC)    SYSTOLIC   Hyperlipidemia    Hypertension    Lung nodule    Obesity    Persistent atrial fibrillation (HCC)    WITH RVR   Pleural effusion    RIGHT UPPER LOBE AND RIGHT LOWER LOBE     Significant Hospital Events: Including procedures, antibiotic start and stop dates in addition to other pertinent events   CT chest-saddle embolus with evidence of right heart strain  Interim History / Subjective:  Chest discomfort, shortness of breath   Objective   Blood pressure 125/64, pulse 70, temperature 97.6 F (36.4 C), temperature source Oral, resp. rate (!) 25, height '5\' 9"'$  (1.753 m), weight 104.6 kg, SpO2 100 %.        Intake/Output Summary (Last 24 hours) at 03/10/2022 0915 Last data filed at 03/10/2022 0900 Gross per 24 hour  Intake 2762.52 ml  Output 1550 ml  Net 1212.52 ml   Filed Weights   03/08/22 1914 03/09/22 1400  Weight: 104.3 kg 104.6 kg    Examination: Physical exam: General: middle aged male, resting in bed  HEENT: NCAT tracking  Neuro: Alert, following commands   Heart: RRR, s1 s2 Lung: CTAB no wheeze   Abdomen: soft nt  nd  Skin: no rash   Resolved Hospital Problem list     Assessment & Plan:   Acute submassive PE Acute right popliteal DVT Acute hypoxemic respiratory failure S/p recent prostatectomy Paroxysmal atrial fibrillation Chronic HFpEF OSA on Bipap  P: Wean Fio2 to HFNC  Repeat ECHO today pending  Continue AC, reload with eliquis, come off heparin infusion  Restart 1/2 dose metoprolol for afib rate control  PT OT  Up in chair  Needs to stay on [facility] for life    Best Practice (right click and "Reselect all SmartList Selections" daily)   Diet/type: Regular consistency (see orders) DVT prophylaxis: systemic heparin GI prophylaxis: N/A Lines: N/A Foley:  N/A Code Status:  full code Last date of multidisciplinary goals of care discussion [full code]  Labs   CBC: Recent Labs  Lab 03/08/22 1932 03/09/22 0425 03/10/22 0515  WBC 14.0* 11.6* 14.3*  NEUTROABS 10.8*  --   --   HGB 13.7 13.4 11.7*  HCT 43.5 41.5 35.3*  MCV 93.8 93.3 90.5  PLT 204 198 301    Basic Metabolic Panel: Recent Labs  Lab 03/08/22 1932  NA 137  K 4.2  CL 103  CO2 22  GLUCOSE 115*  BUN 20  CREATININE 0.84  CALCIUM 9.8   GFR: Estimated Creatinine Clearance: 114.3 mL/min (by C-G formula based on SCr of 0.84 mg/dL). Recent  Labs  Lab 03/08/22 1932 03/09/22 0425 03/10/22 0515  WBC 14.0* 11.6* 14.3*  LATICACIDVEN  --  1.1  --     Liver Function Tests: Recent Labs  Lab 03/08/22 1932  AST 26  ALT 30  ALKPHOS 76  BILITOT 0.9  PROT 6.5  ALBUMIN 2.9*   No results for input(s): "LIPASE", "AMYLASE" in the last 168 hours. No results for input(s): "AMMONIA" in the last 168 hours.  ABG    Component Value Date/Time   TCO2 27 05/31/2020 0830     Coagulation Profile: No results for input(s): "INR", "PROTIME" in the last 168 hours.  Cardiac Enzymes: No results for input(s): "CKTOTAL", "CKMB", "CKMBINDEX", "TROPONINI" in the  last 168 hours.  HbA1C: Hemoglobin A1C  Date/Time Value Ref Range Status  01/16/2022 12:00 AM 5.4 4.0 - 5.6 % Final    CBG: Recent Labs  Lab 03/10/22 0805  GLUCAP 142*   This patient is critically ill with multiple organ system failure; which, requires frequent high complexity decision making, assessment, support, evaluation, and titration of therapies. This was completed through the application of advanced monitoring technologies and extensive interpretation of multiple databases. During this encounter critical care time was devoted to patient care services described in this note for 32 minutes.    [facility] 03/10/2022 9:16 AM

## 2022-03-10 NOTE — Progress Notes (Signed)
ANTICOAGULATION CONSULT NOTE - Follow-Up Consult  Pharmacy Consult for heparin Indication: pulmonary embolus  No Known Allergies  Patient Measurements: Height: '5\' 9"'$  (175.3 cm) Weight: 104.6 kg (230 lb 9.6 oz) IBW/kg (Calculated) : 70.7 Heparin Dosing Weight: 93.2kg  Vital Signs: Temp: 99.6 F (37.6 C) (07/07 0355) Temp Source: Oral (07/07 0355) Pulse Rate: 75 (07/07 0600)  Labs: Recent Labs    03/08/22 1932 03/08/22 2139 03/09/22 0258 03/09/22 0425 03/09/22 1921 03/10/22 0515  HGB 13.7  --   --  13.4  --  11.7*  HCT 43.5  --   --  41.5  --  35.3*  PLT 204  --   --  198  --  223  HEPARINUNFRC  --   --   --  0.25* 0.30 0.23*  CREATININE 0.84  --   --   --   --   --   TROPONINIHS 11 46* 81* 74*  --   --      Estimated Creatinine Clearance: 114.3 mL/min (by C-G formula based on SCr of 0.84 mg/dL).   Medical History: Past Medical History:  Diagnosis Date   Abnormality of thoracic aorta    4.3 CM ECTATIC ASENDING    Cardiopathy    CHF (congestive heart failure) (HCC)    SYSTOLIC   Hyperlipidemia    Hypertension    Lung nodule    Obesity    Persistent atrial fibrillation (HCC)    WITH RVR   Pleural effusion    RIGHT UPPER LOBE AND RIGHT LOWER LOBE     Assessment: 15 YOM presenting with SOB, CT angio chest with PE, hx of afib on Eliquis PTA (last dose two weeks ago d/t procedure).  Heparin level below goal= 0.23, no infusion issues Current heparin infusion rate: 1800 units/hr No s/sx of bleeding per RN  Goal of Therapy:  Heparin level 0.3-0.7 units/ml Monitor platelets by anticoagulation protocol: Yes   Plan:  Heparin bolus 2000 units IV x1 Increase heparin infusion to 2000 units/hr Re-check heparin level with AM labs Monitor daily CBC, heparin level, and s/sx of bleeding F/u long term [facility] plan  Georga Bora, PharmD Clinical Pharmacist 03/10/2022 6:35 AM Please check AMION for all [place] numbers

## 2022-03-10 NOTE — TOC Initial Note (Addendum)
Transition of Care [facility]) - Initial/Assessment Note    Patient Details  Name: Jeffrey Griffin MRN: [id number] Date of Birth: [birth year]  Transition of Care [facility]) CM/SW Contact:    Tom-Johnson, Renea Ee, RN Phone Number: 03/10/2022, 1:42 PM  Clinical Narrative:                  CM spoke with patient and wife, Almyra Free at bedside about needs for post hospital transition. Admitted for Pulmonary Embolus on Eliquis. From home with wife. Has three daughters. Currently not employed, not on disability and not retired. Independent with care and able to drive self prior to admission. Currently on 10 L HFNC, does not use Oxygen at home. States her PCP [facility] at [facility] but could not remember name. [facility] [place]. No TOC needs and recommendations noted at this time. CM will continue to follow with needs.    Barriers to Discharge: Continued Medical Work up   Patient Goals and CMS Choice Patient states their goals for this hospitalization and ongoing recovery are:: To return home CMS Medicare.gov Compare Post Acute Care list provided to:: Patient Choice offered to / list presented to : Patient, Spouse  Expected Discharge Plan and Services     Discharge Planning Services: CM Consult   Living arrangements for the past 2 months: Single Family Home                                      Prior Living Arrangements/Services Living arrangements for the past 2 months: Single Family Home Lives with:: Spouse Patient language and need for interpreter reviewed:: Yes        Need for Family Participation in Patient Care: Yes (Comment) Care giver support system in place?: Yes (comment)   Criminal Activity/Legal Involvement Pertinent to Current Situation/Hospitalization: No - Comment as needed  Activities of Daily Living      Permission Sought/Granted                  Emotional Assessment Appearance:: Appears stated age Attitude/Demeanor/Rapport:  Engaged, Gracious Affect (typically observed): Accepting, Appropriate, Calm, Hopeful Orientation: : Oriented to Self, Oriented to Place, Oriented to  Time, Oriented to Situation Alcohol / Substance Use: Not Applicable Psych Involvement: No (comment)  Admission diagnosis:  Pulmonary embolism (HCC) [I26.99] PE (pulmonary thromboembolism) (HCC) [I26.99] Acute saddle pulmonary embolism with acute cor pulmonale (HCC) [I26.02] Patient Active Problem List   Diagnosis Date Noted   Pulmonary embolism ([place]) 03/09/2022   OSA (obstructive sleep apnea) 03/09/2022   PE (pulmonary thromboembolism) ([place]) 03/09/2022   S/P prostatectomy    Paroxysmal atrial fibrillation (HCC)    BPPV (benign paroxysmal positional vertigo) 04/05/2021   Fatigue 03/04/2021   Subcutaneous nodule of left foot 03/04/2021   Viral wart on finger 03/04/2021   Acute pain of left shoulder 01/28/2021   Atypical atrial flutter ([place]) 05/26/2020   Secondary hypercoagulable state ([place]) 04/13/2020   Chronic combined systolic and diastolic CHF (congestive heart failure) ([place]) 04/06/2020   Acute on chronic systolic CHF (congestive heart failure) ([place]) 10/01/2019   Persistent atrial fibrillation ([place]) 10/01/2019   Pulmonary embolus ([place]) 10/01/2019   PCP:  [name], DO [id number] [place] [place] [id number] [address]Phone: [phone]:: [phone]    Social Determinants of Health (SDOH) Interventions    Readmission Risk Interventions  No data to display

## 2022-03-10 NOTE — Progress Notes (Signed)
PT has home CPAP. Set up with 5L bleed in. Pt will place on himself when he is ready. Currently on [facility] tolerating well. Will continue to monitor.

## 2022-03-10 NOTE — Progress Notes (Addendum)
[facility] Progress Note Patient Name: Jeffrey Griffin DOB: [birth year] MRN: [id number]   Date of Service  03/10/2022  HPI/Events of Note  Patient requests Tylenol for pain. AST and ALT both normal. Able to take PO.  eICU Interventions  Plan: Tylenol 650 mg PO Q 6 hours PRN pain.     Intervention Category Major Interventions: Other:  Neyland Pettengill Cornelia Copa 03/10/2022, 6:29 AM

## 2022-03-10 NOTE — Progress Notes (Addendum)
ANTICOAGULATION CONSULT NOTE - Follow Up Consult  Pharmacy Consult for Eliquis Indication: atrial fibrillation and pulmonary embolus  No Known Allergies  Patient Measurements: Height: '5\' 9"'$  (175.3 cm) Weight: 104.6 kg (230 lb 9.6 oz) IBW/kg (Calculated) : 70.7 Heparin Dosing Weight: N/A  Vital Signs: Temp: 97.6 F (36.4 C) (07/07 0807) Temp Source: Oral (07/07 0807) Pulse Rate: 70 (07/07 0900)  Labs: Recent Labs    03/08/22 1932 03/08/22 2139 03/09/22 0258 03/09/22 0425 03/09/22 1921 03/10/22 0515  HGB 13.7  --   --  13.4  --  11.7*  HCT 43.5  --   --  41.5  --  35.3*  PLT 204  --   --  198  --  223  HEPARINUNFRC  --   --   --  0.25* 0.30 0.23*  CREATININE 0.84  --   --   --   --   --   TROPONINIHS 11 46* 81* 74*  --   --     Estimated Creatinine Clearance: 114.3 mL/min (by C-G formula based on SCr of 0.84 mg/dL).   Medications:  Medications Prior to Admission  Medication Sig Dispense Refill Last Dose   acetaminophen (TYLENOL) 500 MG tablet Take 500 mg by mouth in the morning, at noon, and at bedtime.   03/08/2022   apixaban (ELIQUIS) 5 MG TABS tablet TAKE 1 TABLET BY MOUTH TWICE A DAY (Patient taking differently: Take 5 mg by mouth 2 (two) times daily.) 180 tablet 2 Past Week at 0600   atorvastatin (LIPITOR) 40 MG tablet TAKE 1 TABLET BY MOUTH EVERY DAY (Patient taking differently: Take 40 mg by mouth daily.) 90 tablet 2 03/07/2022   Carnitine-B5-B6 500-15-5 MG TABS Take 1 tablet by mouth daily.   Past Week   Cholecalciferol (VITAMIN D3) 25 MCG (1000 UT) CAPS Take 1,000 Units by mouth daily.    Past Week   Coenzyme Q10 300 MG CAPS Take 1 capsule by mouth daily.   Past Week   Cyanocobalamin (B-12) 5000 MCG CAPS Take 1 capsule by mouth daily.   Past Week   diclofenac Sodium (VOLTAREN) 1 % GEL Apply 4 g topically 4 (four) times daily. (Patient taking differently: Apply 4 g topically daily as needed (For pain).) 350 g 0 Past Week   Garlic 6073 MG CAPS Take 1 capsule by  mouth daily.   Past Week   Glucosamine-Chondroitin (COSAMIN DS PO) Take 2 tablets by mouth daily.   Past Week   Hawthorn 150 MG CAPS Take 1 capsule by mouth daily at 6 (six) AM.   Past Week   ibuprofen (ADVIL) 200 MG tablet Take 200 mg by mouth every 6 (six) hours as needed for mild pain.   03/08/2022   meclizine (ANTIVERT) 25 MG tablet TAKE 1 TABLET BY MOUTH 3 TIMES DAILY AS NEEDED FOR DIZZINESS. (Patient taking differently: Take 25 mg by mouth 3 (three) times daily as needed for dizziness.) 90 tablet 1 Past Month   metoprolol succinate (TOPROL-XL) 50 MG 24 hr tablet TAKE 1 TABLET BY MOUTH DAILY. TAKE WITH OR IMMEDIATELY FOLLOWING A MEAL. (Patient taking differently: Take 50 mg by mouth daily.) 90 tablet 3 03/08/2022 at 0600   Multiple Minerals-Vitamins (CAL MAG ZINC +D3 PO) Take 1 tablet by mouth daily.    Past Week   Multiple Vitamin (MULTIVITAMIN WITH MINERALS) TABS tablet Take 1 tablet by mouth daily.   Past Week   Omega-3 1400 MG CAPS Take 1 capsule by mouth daily.   Past  Week   oxybutynin (DITROPAN) 5 MG tablet Take 5 mg by mouth 3 (three) times daily.      sacubitril-valsartan (ENTRESTO) 97-103 MG Take 1 tablet by mouth 2 (two) times daily. 180 tablet 3 03/08/2022   Taurine 1000 MG CAPS Take 1 capsule by mouth daily.   Past Week   vitamin E 180 MG (400 UNITS) capsule Take 400 Units by mouth daily.   Past Week   Zn-Pyg Afri-Nettle-Saw Palmet (SAW PALMETTO COMPLEX PO) Take 1 tablet by mouth daily.   Past Week    Assessment: 61 y.o male presented with SOB. At home he took eliquis '5mg'$  BID for afib and was off the medication two weeks ago for prostatectomy, 1 week ago. CT angiogram confirmed saddle embolism and diffuse emboli and doppler showed bilateral DVTs. Patient received thrombectomy on 7/6.  Pharmacy consulted to transition from heparin to eliquis. No bleeding reported and CBC is stable.  Goal of Therapy:  Monitor for signs of bleed Monitor platelets by anticoagulation protocol: Yes    Plan:  D/c heparin and start eliquis '10mg'$  PO BIDx7 days followed by '5mg'$  PO BID  Pharmacy will sign off. Thank you for the consult.   Sandford Craze 03/10/2022,9:37 AM

## 2022-03-10 NOTE — Evaluation (Signed)
Physical Therapy Evaluation Patient Details Name: Jeffrey Griffin MRN: [id number] DOB: 1961-02-08 Today's Date: 03/10/2022  History of Present Illness  61 y.o. M admitted on 03/09/22 due to chest pain and shortness of breath. He was found to have a R sided saddle PE. PMH significant for HTN, HLD, A fib, CHF.  Clinical Impression  Pt admitted with/for PE.  Pt limited mostly by chest pain.  Mobility needing min assist overall with mod to get up to EOB.  Pt currently limited functionally due to the problems listed below.  (see problems list.)  Pt will benefit from PT to maximize function and safety to be able to get home safely with available assist.        Recommendations for follow up therapy are one component of a multi-disciplinary discharge planning process, led by the attending physician.  Recommendations may be updated based on patient status, additional functional criteria and insurance authorization.  Follow Up Recommendations No PT follow up      Assistance Recommended at Discharge Intermittent Supervision/Assistance  Patient can return home with the following  A little help with walking and/or transfers;A little help with bathing/dressing/bathroom;Assistance with cooking/housework    Equipment Recommendations None recommended by PT  Recommendations for Other Services       Functional Status Assessment Patient has had a recent decline in their functional status and demonstrates the ability to make significant improvements in function in a reasonable and predictable amount of time.     Precautions / Restrictions Precautions Precautions: Fall Restrictions Weight Bearing Restrictions: No      Mobility  Bed Mobility Overal bed mobility: Needs Assistance Bed Mobility: Supine to Sit, Sit to Supine     Supine to sit: Mod assist Sit to supine: Min assist   General bed mobility comments: Mod A for trunk elevation, Min A for BLE management and increased time due to pain     Transfers Overall transfer level: Needs assistance Equipment used: None Transfers: Sit to/from Stand Sit to Stand: Min assist           General transfer comment: Min A to power up and steady    Ambulation/Gait Ambulation/Gait assistance: Min assist Gait Distance (Feet): 40 Feet Assistive device: None Gait Pattern/deviations: Step-through pattern   Gait velocity interpretation: <1.8 ft/sec, indicate of risk for recurrent falls   General Gait Details: mildly unsteady, guarded, limited by left chest pain.  Stairs            Wheelchair Mobility    Modified Rankin (Stroke Patients Only)       Balance Overall balance assessment: Mild deficits observed, not formally tested                                           Pertinent Vitals/Pain Pain Assessment Pain Assessment: Faces Faces Pain Scale: Hurts whole lot Pain Location: R side of chest Pain Descriptors / Indicators: Aching, Discomfort, Grimacing, Guarding Pain Intervention(s): Limited activity within patient's tolerance    Home Living Family/patient expects to be discharged to:: Private residence Living Arrangements: Spouse/significant other Available Help at Discharge: Family;Available 24 hours/day Type of Home: House Home Access: Stairs to enter Entrance Stairs-Rails: None Entrance Stairs-Number of Steps: 2 steps   Home Layout: One level Home Equipment: None      Prior Function Prior Level of Function : Independent/Modified Independent;Working/employed;Driving  Mobility Comments: no AD ADLs Comments: Indep     Hand Dominance   Dominant Hand: Right    Extremity/Trunk Assessment   Upper Extremity Assessment Upper Extremity Assessment: Generalized weakness    Lower Extremity Assessment Lower Extremity Assessment: Generalized weakness    Cervical / Trunk Assessment Cervical / Trunk Assessment: Other exceptions Cervical / Trunk Exceptions: guarding his  R side due to pain  Communication   Communication: No difficulties  Cognition Arousal/Alertness: Awake/alert Behavior During Therapy: WFL for tasks assessed/performed Overall Cognitive Status: Within Functional Limits for tasks assessed                                          General Comments General comments (skin integrity, edema, etc.): VSS on 6L - reduced pt from 10L to 8L with no difficulties, then 8-6L and he continued to maintain above90%    Exercises     Assessment/Plan    PT Assessment Patient needs continued PT services  PT Problem List Decreased strength;Decreased activity tolerance;Decreased balance;Decreased mobility;Cardiopulmonary status limiting activity;Pain       PT Treatment Interventions DME instruction;Gait training;Stair training;Functional mobility training;Therapeutic activities;Balance training;Patient/family education    PT Goals (Current goals can be found in the Care Plan section)  Acute Rehab PT Goals Patient Stated Goal: back to PLOF PT Goal Formulation: With patient Time For Goal Achievement: 03/24/22 Potential to Achieve Goals: Good    Frequency Min 3X/week     Co-evaluation               AM-PAC PT "6 Clicks" Mobility  Outcome Measure Help needed turning from your back to your side while in a flat bed without using bedrails?: A Little Help needed moving from lying on your back to sitting on the side of a flat bed without using bedrails?: A Lot Help needed moving to and from a bed to a chair (including a wheelchair)?: A Little Help needed standing up from a chair using your arms (e.g., wheelchair or bedside chair)?: A Little Help needed to walk in hospital room?: A Little Help needed climbing 3-5 steps with a railing? : A Lot 6 Click Score: 16    End of Session Equipment Utilized During Treatment: Oxygen Activity Tolerance: Patient tolerated treatment well;Patient limited by pain Patient left: in bed;with call  bell/phone within reach;with bed alarm set Nurse Communication: Mobility status PT Visit Diagnosis: Other abnormalities of gait and mobility (R26.89);Difficulty in walking, not elsewhere classified (R26.2);Pain Pain - Right/Left: Left Pain - part of body:  (chest)    Time: 9470-9628 PT Time Calculation (min) (ACUTE ONLY): 25 min   Charges:   PT Evaluation $PT Eval Moderate Complexity: 1 Mod          03/10/2022  Ginger Carne., PT Acute Rehabilitation Services [phone]  (pager) [phone]  (office)  Tessie Fass Lubertha Leite 03/10/2022, 6:50 PM

## 2022-03-11 ENCOUNTER — Inpatient Hospital Stay (HOSPITAL_COMMUNITY): Payer: No Typology Code available for payment source

## 2022-03-11 DIAGNOSIS — I2602 Saddle embolus of pulmonary artery with acute cor pulmonale: Secondary | ICD-10-CM | POA: Diagnosis not present

## 2022-03-11 DIAGNOSIS — G4733 Obstructive sleep apnea (adult) (pediatric): Secondary | ICD-10-CM | POA: Diagnosis not present

## 2022-03-11 DIAGNOSIS — I4819 Other persistent atrial fibrillation: Secondary | ICD-10-CM | POA: Diagnosis not present

## 2022-03-11 DIAGNOSIS — Z9079 Acquired absence of other genital organ(s): Secondary | ICD-10-CM | POA: Diagnosis not present

## 2022-03-11 LAB — CBC
HCT: 31.3 % — ABNORMAL LOW (ref 39.0–52.0)
Hemoglobin: 10.6 g/dL — ABNORMAL LOW (ref 13.0–17.0)
MCH: 30.6 pg (ref 26.0–34.0)
MCHC: 33.9 g/dL (ref 30.0–36.0)
MCV: 90.5 fL (ref 80.0–100.0)
Platelets: 244 10*3/uL (ref 150–400)
RBC: 3.46 MIL/uL — ABNORMAL LOW (ref 4.22–5.81)
RDW: 12.3 % (ref 11.5–15.5)
WBC: 14.6 10*3/uL — ABNORMAL HIGH (ref 4.0–10.5)
nRBC: 0 % (ref 0.0–0.2)

## 2022-03-11 LAB — URINALYSIS, COMPLETE (UACMP) WITH MICROSCOPIC
Bilirubin Urine: NEGATIVE
Glucose, UA: NEGATIVE mg/dL
Ketones, ur: NEGATIVE mg/dL
Nitrite: POSITIVE — AB
Protein, ur: 100 mg/dL — AB
RBC / HPF: >50 RBC/hpf — ABNORMAL HIGH (ref 0–5)
Specific Gravity, Urine: 1.014 (ref 1.005–1.030)
WBC, UA: >50 WBC/hpf — ABNORMAL HIGH (ref 0–5)
pH: 6 (ref 5.0–8.0)

## 2022-03-11 LAB — GLUCOSE, CAPILLARY: Glucose-Capillary: 134 mg/dL — ABNORMAL HIGH (ref 70–99)

## 2022-03-11 MED ORDER — OXYCODONE HCL 5 MG PO TABS
5.0000 mg | ORAL_TABLET | Freq: Four times a day (QID) | ORAL | Status: DC | PRN
  Administered 2022-03-11: 5 mg via ORAL
  Filled 2022-03-11: qty 1

## 2022-03-11 MED ORDER — SODIUM CHLORIDE 0.9 % IV SOLN
1.0000 g | INTRAVENOUS | Status: AC
  Administered 2022-03-11 – 2022-03-13 (×3): 1 g via INTRAVENOUS
  Filled 2022-03-11 (×3): qty 10

## 2022-03-11 MED ORDER — POLYETHYLENE GLYCOL 3350 17 G PO PACK
17.0000 g | PACK | Freq: Every day | ORAL | Status: DC
  Administered 2022-03-11 – 2022-03-14 (×4): 17 g via ORAL
  Filled 2022-03-11 (×4): qty 1

## 2022-03-11 MED ORDER — ACETAMINOPHEN 325 MG PO TABS
650.0000 mg | ORAL_TABLET | Freq: Four times a day (QID) | ORAL | Status: DC
  Administered 2022-03-11 – 2022-03-14 (×10): 650 mg via ORAL
  Filled 2022-03-11 (×12): qty 2

## 2022-03-11 NOTE — Progress Notes (Addendum)
Pt transferred from 74M to [place] called , cHG bath and foley care given. Pt on 5L [place]. Pt had foley PTA for previous surgery     03/11/22 1136  Vitals  Temp 99 F (37.2 C)  Temp Source Oral  BP 134/71  MAP (mmHg) 88  BP Location Left Arm  BP Method Automatic  Patient Position (if appropriate) Lying  Pulse Rate 77  Pulse Rate Source Monitor  ECG Heart Rate 77  Resp 19  Level of Consciousness  Level of Consciousness Alert  MEWS COLOR  MEWS Score Color Green  Oxygen Therapy  SpO2 94 %  O2 Device Nasal Cannula  O2 Flow Rate (L/min) 5 L/min  Pain Assessment  Pain Scale 0-10  Pain Score 7  Pain Location Chest  MEWS Score  MEWS Temp 0  MEWS Systolic 0  MEWS Pulse 0  MEWS RR 0  MEWS LOC 0  MEWS Score 0

## 2022-03-11 NOTE — Progress Notes (Signed)
Daily Progress Note Intern Pager: [phone]  Patient name: Jeffrey Griffin Medical record number: [id number] Date of birth: 03-27-1961 Age: 61 y.o. Gender: male  Primary Care Provider: [name], DO Consultants: IR, PCCM Code Status: FULL  Pt Overview and Major Events to Date:  Jeffrey Griffin is a 61 y.o. male presenting with shortness of breath and back pain.  Patient admitted for saddle PE in setting of recent prostatectomy and stopping Eliquis. PMH significant for A-fib on Eliquis, history of PE, CHF, HTN, prostate cancer (s/p prostatectomy). -PCCM consulted 7/623 -IR consulted 03/09/22 -Thrombectomy transfer to ICU, 03/09/22, -Transferred to FMTS service 03/11/22  Assessment and Plan:  * Pulmonary embolism ([place]) Saddle pulmonary embolus with extensive bilateral pulmonary emboli confirmed by CT angio with right heart strain. Hemodynamically stable of 5L . Likely provoked in the setting of recent surgery, off coagulation x2 weeks, A-fib, CHF, obesity, OSA. sPESI score 3 (hx of cancer, SpO2  <90% initially, CHF) indicating high risk with 8.9% mortality. S/p thrombectomy, with improving respiratory status - Transfer to FMTS 03/11/22 - Continue Eliquis '10mg'$  - Continue continuous telemetry and pulse oximetry -Oxygen therapy as needed to keep O2 saturations 94% or greater - Vital signs per nursing protocol; monitor BP - Morphine '2mg'$  q4, Tylenol - PT/OT eval and treat - Stop LR 111m/hr  Persistent atrial fibrillation (HCC) History of A-fib on Eliquis. NSR on tele. - Hold Eliquis given heparin therapy, continue metoprolol succinate - Continuous telemetry  Chronic combined systolic and diastolic CHF (congestive heart failure) ([place] 20[place] echo shows LVEF of 55% - Hold Entresto - ECHO this admission EF 55%-60%  OSA (obstructive sleep apnea) History of sleep apnea - Continue CPAP use  S/P prostatectomy Surgical incisions appear to be healing well.  - Continue oxybutynin -  Morphine and Tylenol - Continue to monitor surgical incision sites - Continue to monitor for any bleeding     FEN/GI: Regular PPx: Eliquis Dispo: Pending, clinical improvement  Subjective:  NAEO. Reports that he still has significant chest pain with inspiration, but his breathing has improved since thrombectomy. Wife and patient report no bleeding from surgical sites.    Objective: Temp:  [98.3 F (36.8 C)-99.4 F (37.4 C)] 99 F (37.2 C) (07/08 1136) Pulse Rate:  [68-81] 77 (07/08 1136) Resp:  [12-32] 19 (07/08 1136) BP: (120-134)/(59-71) 134/71 (07/08 1136) SpO2:  [93 %-98 %] 94 % (07/08 1136) Arterial Line BP: (123-170)/(45-66) 152/56 (07/08 0800) Physical Exam: General: A&O, in mild discomfort sitting in hospital bed, no obvious bleeding from surgical site HEENT: No sign of trauma, EOM grossly intact Cardiac: RRR, no m/r/g Respiratory: CTAB, increased WOB, no w/c/r GI: Soft, NTTP, non-distended  Extremities: NTTP, no peripheral edema. Neuro: Normal gait, moves all four extremities appropriately. Psych: Appropriate mood and affect   Laboratory: Most recent CBC Lab Results  Component Value Date   WBC 14.6 (H) 03/11/2022   HGB 10.6 (L) 03/11/2022   HCT 31.3 (L) 03/11/2022   MCV 90.5 03/11/2022   PLT 244 03/11/2022   Most recent BMP    Latest Ref Rng & Units 03/08/2022    7:32 PM  BMP  Glucose 70 - 99 mg/dL 115   BUN 6 - 20 mg/dL 20   Creatinine 0.61 - 1.24 mg/dL 0.84   Sodium 135 - 145 mmol/L 137   Potassium 3.5 - 5.1 mmol/L 4.2   Chloride 98 - 111 mmol/L 103   CO2 22 - 32 mmol/L 22   Calcium 8.9 - 10.3 mg/dL  9.8     Other pertinent labs: none   Imaging/Diagnostic Tests:  ECHO 03/10/22 IMPRESSION: 1. Left ventricular ejection fraction, by estimation, is 55 to 60%. The  left ventricle has normal function. The left ventricle has no regional  wall motion abnormalities. There is moderate left ventricular hypertrophy.  Left ventricular diastolic   parameters were normal.   2. Right ventricular systolic function is normal. The right ventricular  size is normal. Tricuspid regurgitation signal is inadequate for assessing  PA pressure.   3. The mitral valve is normal in structure. No evidence of mitral valve  regurgitation. No evidence of mitral stenosis.   4. The aortic valve is tricuspid. Aortic valve regurgitation is not  visualized. No aortic stenosis is present.   5. Aortic dilatation noted. There is dilatation of the aortic root,  measuring 42 mm. There is dilatation of the ascending aorta, measuring 41  mm.   6. The inferior vena cava is normal in size with greater than 50%  respiratory variability, suggesting right atrial pressure of 3 mmHg.   Salvadore Oxford, MD 03/11/2022, 12:51 PM  PGY-1, [place] Intern pager: [phone], text pages welcome Secure chat group [place]

## 2022-03-11 NOTE — Progress Notes (Signed)
FMTS Interim Progress Note  S:Nurse called to report elevated temperature rectally, as patient reported feeling hot which had been present since thrombectomy. MD to bedside. No further symptoms reported by patient. Explained to patient and family concern for infection, but post op fever is not uncommon due widespread inflammation.  O: BP (!) 146/70 (BP Location: Left Arm)   Pulse 92   Temp (!) 102 F (38.9 C) (Rectal)   Resp (!) 24   Ht '5\' 9"'$  (1.753 m)   Wt 104.6 kg   SpO2 93%   BMI 34.05 kg/m    Respiratory: Good air movement bilaterally. No wheezes, rhonchi, or rubs. Talking comfortably on 5L [place]. Cardio: Normal rate and rhythm. Abdomen: Prostatectomy surgical incisions, clean dry and intact. IR surgical incision clean dry and intact. GU: Urine output from foley, dark yellow Extremities: no lower extremity edema.   A/P: No signs of acute infection. Will order CXR with low threshold for CT should further symptoms develop. UA ordered. Continue to monitor respiratory status and vitals.   Salvadore Oxford, MD 03/11/2022, 6:55 PM PGY-1, [facility] pager [phone]

## 2022-03-11 NOTE — Progress Notes (Signed)
PT Cancellation Note  Patient Details Name: Jeffrey Griffin MRN: [id number] DOB: [birth year]   Cancelled Treatment:    Reason Eval/Treat Not Completed: Patient declined, no reason specified Patient complaining of fatigue and rough transfer to 4E. Asking to defer therapy this date. Will re-attempt at later date.   Hitoshi Werts A. Gilford Rile PT, DPT Acute Rehabilitation Services Office [phone]    Linna Hoff 03/11/2022, 4:35 PM

## 2022-03-11 NOTE — Progress Notes (Signed)
Pt Diaphoretic, ice pack and cold towel therapy applied  Oral Temp 98.6 Axillary Temp 101.6  Obvious Oral temp is inaccurate so rectal temp taken  Rectal Temp 102.0  Pt has been getting Tylenol Q6  MD paged  Xray and Urinalysis Ordered  Will continue to monitor and update

## 2022-03-11 NOTE — Progress Notes (Signed)
Pt says he usually wears CPAP at home but refused it tonight. Says he does not want to wear it here while he has been sick. Vitals stable on 4L [place]

## 2022-03-12 ENCOUNTER — Inpatient Hospital Stay (HOSPITAL_COMMUNITY): Payer: No Typology Code available for payment source

## 2022-03-12 DIAGNOSIS — M25562 Pain in left knee: Secondary | ICD-10-CM

## 2022-03-12 DIAGNOSIS — I5042 Chronic combined systolic (congestive) and diastolic (congestive) heart failure: Secondary | ICD-10-CM | POA: Diagnosis not present

## 2022-03-12 DIAGNOSIS — I2602 Saddle embolus of pulmonary artery with acute cor pulmonale: Secondary | ICD-10-CM | POA: Diagnosis not present

## 2022-03-12 DIAGNOSIS — M25561 Pain in right knee: Secondary | ICD-10-CM | POA: Diagnosis not present

## 2022-03-12 DIAGNOSIS — R509 Fever, unspecified: Secondary | ICD-10-CM | POA: Diagnosis not present

## 2022-03-12 LAB — BASIC METABOLIC PANEL
Anion gap: 10 (ref 5–15)
BUN: 16 mg/dL (ref 6–20)
CO2: 29 mmol/L (ref 22–32)
Calcium: 9.3 mg/dL (ref 8.9–10.3)
Chloride: 95 mmol/L — ABNORMAL LOW (ref 98–111)
Creatinine, Ser: 0.79 mg/dL (ref 0.61–1.24)
GFR, Estimated: >60 mL/min (ref >60)
Glucose, Bld: 127 mg/dL — ABNORMAL HIGH (ref 70–99)
Potassium: 4 mmol/L (ref 3.5–5.1)
Sodium: 134 mmol/L — ABNORMAL LOW (ref 135–145)

## 2022-03-12 LAB — CBC WITH DIFFERENTIAL/PLATELET
Abs Immature Granulocytes: 0.06 10*3/uL (ref 0.00–0.07)
Basophils Absolute: 0 10*3/uL (ref 0.0–0.1)
Basophils Relative: 0 %
Eosinophils Absolute: 0.2 10*3/uL (ref 0.0–0.5)
Eosinophils Relative: 2 %
HCT: 31.5 % — ABNORMAL LOW (ref 39.0–52.0)
Hemoglobin: 10.3 g/dL — ABNORMAL LOW (ref 13.0–17.0)
Immature Granulocytes: 1 %
Lymphocytes Relative: 8 %
Lymphs Abs: 0.9 10*3/uL (ref 0.7–4.0)
MCH: 30 pg (ref 26.0–34.0)
MCHC: 32.7 g/dL (ref 30.0–36.0)
MCV: 91.8 fL (ref 80.0–100.0)
Monocytes Absolute: 1.4 10*3/uL — ABNORMAL HIGH (ref 0.1–1.0)
Monocytes Relative: 11 %
Neutro Abs: 9.6 10*3/uL — ABNORMAL HIGH (ref 1.7–7.7)
Neutrophils Relative %: 78 %
Platelets: 276 10*3/uL (ref 150–400)
RBC: 3.43 MIL/uL — ABNORMAL LOW (ref 4.22–5.81)
RDW: 12.3 % (ref 11.5–15.5)
WBC: 12.2 10*3/uL — ABNORMAL HIGH (ref 4.0–10.5)
nRBC: 0 % (ref 0.0–0.2)

## 2022-03-12 MED ORDER — SACUBITRIL-VALSARTAN 24-26 MG PO TABS
1.0000 | ORAL_TABLET | Freq: Two times a day (BID) | ORAL | Status: DC
  Administered 2022-03-12 – 2022-03-13 (×2): 1 via ORAL
  Filled 2022-03-12 (×2): qty 1

## 2022-03-12 MED ORDER — LIDOCAINE 4 % EX CREA
TOPICAL_CREAM | Freq: Two times a day (BID) | CUTANEOUS | Status: DC | PRN
  Filled 2022-03-12: qty 5

## 2022-03-12 MED ORDER — GUAIFENESIN ER 600 MG PO TB12
600.0000 mg | ORAL_TABLET | Freq: Two times a day (BID) | ORAL | Status: DC | PRN
  Administered 2022-03-12: 600 mg via ORAL
  Filled 2022-03-12: qty 1

## 2022-03-12 MED ORDER — OXYMETAZOLINE HCL 0.05 % NA SOLN
2.0000 | Freq: Once | NASAL | Status: AC
  Administered 2022-03-12: 2 via NASAL
  Filled 2022-03-12: qty 30

## 2022-03-12 NOTE — Progress Notes (Signed)
Daily Progress Note Intern Pager: [phone]  Patient name: Jeffrey Griffin Medical record number: [id number] Date of birth: Oct 27, 1960 Age: 61 y.o. Gender: male  Primary Care Provider: [name], DO Consultants: PCCM, IR Code Status: Full  Pt Overview and Major Events to Date:  7/5 admitted 7/6 IR thrombectomy of saddle PE 7/6-8 ICU stay for observation 7/8 out to FMTS service  Assessment and Plan: [name] is a 61 year old male admitted for saddle PE after stopping Eliquis for prostatectomy recently.  PMH includes A-fib on Eliquis, previous PE, HFrEF, HTN, prostate cancer s/p prostatectomy.  * Pulmonary embolism ([place]) S/p thrombectomy 7/6.  Respiratory status improved, now on 4 L supplemental oxygen.  Residual chest wall pain.  CT chest this morning most consistent with atelectasis and left dependent lobes, trace right lower lobe. - Continue Eliquis '10mg'$  - Continue continuous telemetry and pulse oximetry - Oxygen therapy as needed to keep O2 saturations 94% or greater - Vital signs per nursing protocol; monitor BP - Mucinex as needed for phlegm - Morphine '2mg'$  q4, Tylenol - PT/OT eval and treat  Fever Tmax 102 F.  CXR unremarkable, CT chest shows likely atelectasis of left lower (trace right lower).  Could be pneumonia, more likely atelectasis.  Urine cultures and UA collected, unfortunately no blood cultures collected.  IR consulted last night, believes this could be a normal inflammatory response s/p thrombectomy. - Continue monitor closely - Encourage incentive spirometry and up out of bed - Plan for ceftriaxone stop Monday 7/10 unless patient deteriorates -A.m. CBC  S/P prostatectomy Surgical site healing well.  Leaking urinary catheter problematic. Plan to reach out to urology Monday morning for FYI. - Continue oxybutynin - Morphine and Tylenol - Continue to monitor surgical incision sites -Continue to monitor for any bleeding  Persistent atrial fibrillation  (HCC) History of A-fib on Eliquis. NSR on tele. - Continue Eliquis, continue metoprolol succinate - Continuous telemetry - FYI'd cardiology today, follows with Jeffrey Griffin and Jeffrey Griffin  Chronic combined systolic and diastolic CHF (congestive heart failure) ([place]) ECHO this admission EF 55%-60%, stable from previous in 2022. Restart Eliquis at low-dose, watch BP closely.  If BP tolerates, consider titrating up to home dose.   OSA (obstructive sleep apnea) History of sleep apnea - Continue CPAP use  Knee pain, bilateral Bilateral medial knee pain and burning with standing or exertion.  Suspect tenderness/MSK in nature.  Doubt rheumatologic or infectious given no warmth, redness, or swelling. - Lidocaine cream applied to area as needed - Will follow   FEN/GI: Regular PPx: Full dose Eliquis Dispo:Pending PT recommendations  pending clinical improvement .   Subjective:  Patient found laying in bed comfortably with wife at bedside.  He reports continued nonproductive cough with feeling of phlegm and continued chest wall pain.  Also experiencing leaking urinary catheter.  Has bilateral medial knee burning and pain with standing or exertion, started when transferring beds for CT scan. Overall seems in good spirits.  Wife is at bedside and is very attentive, asking great questions.  Objective: Temp:  [98.5 F (36.9 C)-101.2 F (38.4 C)] 98.5 F (36.9 C) (07/09 1532) Pulse Rate:  [70-83] 74 (07/09 1532) Resp:  [19-26] 20 (07/09 1532) BP: (115-156)/(68-90) 142/77 (07/09 1532) SpO2:  [92 %-95 %] 95 % (07/09 1532) Physical Exam: General: Awake, alert, oriented, no acute distress Cardiovascular: Regular rate and rhythm, no murmurs Respiratory: Superior fields clear, right lower lobe posteriorly with faint rales, left lower lobe posteriorly diminished Abdomen: Nondistended Extremities: No BLE  edema, able to stand at bedside  Laboratory: Most recent CBC Lab Results  Component Value Date    WBC 12.2 (H) 03/12/2022   HGB 10.3 (L) 03/12/2022   HCT 31.5 (L) 03/12/2022   MCV 91.8 03/12/2022   PLT 276 03/12/2022   Most recent BMP    Latest Ref Rng & Units 03/12/2022   12:47 AM  BMP  Glucose 70 - 99 mg/dL 127   BUN 6 - 20 mg/dL 16   Creatinine 0.61 - 1.24 mg/dL 0.79   Sodium 135 - 145 mmol/L 134   Potassium 3.5 - 5.1 mmol/L 4.0   Chloride 98 - 111 mmol/L 95   CO2 22 - 32 mmol/L 29   Calcium 8.9 - 10.3 mg/dL 9.3    Imaging/Diagnostic Tests: CHEST - 2 VIEW 7/08 IMPRESSION: 1. Small left pleural effusion with increasing left lower lobe airspace disease worrisome for infection. 2. Trace right pleural effusion. 3. Mild cardiomegaly.  CT CHEST WITHOUT CONTRAST 7/09 IMPRESSION: 1. Small to moderate left pleural effusion associated with dependent left lower lobe, and to a lesser degree, left upper lobe lingula, opacity. Lung opacities are likely atelectasis. A component of pneumonia should be considered if there are consistent clinical findings. 2. Trace right pleural effusion with subsegmental lung base atelectasis. 3. Pleural effusions and lung opacities have increased, accounting for opacities noted on the previous day's chest radiograph. 4. Dilated ascending thoracic aorta to 4.1 cm.  Jeffrey Essex, MD 03/12/2022, 5:41 PM PGY-3, [place] Intern pager: [phone], text pages welcome Secure chat group [place]

## 2022-03-12 NOTE — Plan of Care (Signed)
  Problem: Clinical Measurements: Goal: Will remain free from infection Outcome: Progressing Goal: Respiratory complications will improve Outcome: Progressing Goal: Cardiovascular complication will be avoided Outcome: Progressing   

## 2022-03-12 NOTE — Assessment & Plan Note (Signed)
Bilateral medial knee pain and burning with standing or exertion.  Suspect tenderness/MSK in nature.  Doubt rheumatologic or infectious given no warmth, redness, or swelling. - Lidocaine cream applied to area as needed - Will follow

## 2022-03-12 NOTE — Assessment & Plan Note (Signed)
Tmax 102 F.  CXR unremarkable, CT chest shows likely atelectasis of left lower (trace right lower).  Could be pneumonia, more likely atelectasis.  Urine cultures and UA collected, unfortunately no blood cultures collected.  IR consulted last night, believes this could be a normal inflammatory response s/p thrombectomy. - Continue monitor closely - Encourage incentive spirometry and up out of bed - Plan for ceftriaxone stop Monday 7/10 unless patient deteriorates -A.m. CBC

## 2022-03-12 NOTE — Progress Notes (Signed)
Occupational Therapy Treatment Patient Details Name: Jeffrey Griffin MRN: [id number] DOB: 10-Sep-1960 Today's Date: 03/12/2022   History of present illness 61 y.o. M admitted on 03/09/22 due to chest pain and shortness of breath. He was found to have a R sided saddle PE. PMH significant for HTN, HLD, A fib, CHF.   OT comments  Pt requires min - mod A for LB ADLs, min A for functional mobility.  Sp02 decreased to 85% on 4L supplemental 02, but rebounded to 94% on 6L with activity.  Activity tolerance is improving.    Recommendations for follow up therapy are one component of a multi-disciplinary discharge planning process, led by the attending physician.  Recommendations may be updated based on patient status, additional functional criteria and insurance authorization.    Follow Up Recommendations  No OT follow up    Assistance Recommended at Discharge Set up Supervision/Assistance  Patient can return home with the following  A little help with walking and/or transfers;A little help with bathing/dressing/bathroom   Equipment Recommendations  Tub/shower bench    Recommendations for Other Services      Precautions / Restrictions Precautions Precautions: Fall       Mobility Bed Mobility Overal bed mobility: Needs Assistance Bed Mobility: Supine to Sit     Supine to sit: Min assist, HOB elevated          Transfers                         Balance Overall balance assessment: Mild deficits observed, not formally tested                                         ADL either performed or assessed with clinical judgement   ADL Overall ADL's : Needs assistance/impaired                     Lower Body Dressing: Moderate assistance Lower Body Dressing Details (indicate cue type and reason): due to pain Toilet Transfer: Minimal assistance;Ambulation           Functional mobility during ADLs: Minimal assistance      Extremity/Trunk Assessment  Upper Extremity Assessment Upper Extremity Assessment: Generalized weakness   Lower Extremity Assessment Lower Extremity Assessment: Generalized weakness        Vision   Vision Assessment?: No apparent visual deficits   Perception     Praxis      Cognition Arousal/Alertness: Awake/alert Behavior During Therapy: WFL for tasks assessed/performed Overall Cognitive Status: Within Functional Limits for tasks assessed                                          Exercises      Shoulder Instructions       General Comments Sp02 94% on 4L 02.  Sp02 85 with activity on 4L, but rebounded to mid 90s on 6L.  Pt instructed in pursed lip breathing and pacing    Pertinent Vitals/ Pain       Pain Assessment Pain Assessment: Faces Faces Pain Scale: Hurts even more Pain Location: Lt side of chest Pain Descriptors / Indicators: Aching, Discomfort, Grimacing, Guarding Pain Intervention(s): Monitored during session  Home Living  Prior Functioning/Environment              Frequency  Min 2X/week        Progress Toward Goals  OT Goals(current goals can now be found in the care plan section)  Progress towards OT goals: Progressing toward goals     Plan Discharge plan remains appropriate;Equipment recommendations need to be updated    Co-evaluation                 AM-PAC OT "6 Clicks" Daily Activity     Outcome Measure   Help from another person eating meals?: A Little Help from another person taking care of personal grooming?: A Little Help from another person toileting, which includes using toliet, bedpan, or urinal?: A Little Help from another person bathing (including washing, rinsing, drying)?: A Little Help from another person to put on and taking off regular upper body clothing?: A Little Help from another person to put on and taking off regular lower body clothing?: A Lot 6 Click  Score: 17    End of Session Equipment Utilized During Treatment: Oxygen  OT Visit Diagnosis: Unsteadiness on feet (R26.81);Other abnormalities of gait and mobility (R26.89);Muscle weakness (generalized) (M62.81)   Activity Tolerance Patient tolerated treatment well   Patient Left in chair;with call bell/phone within reach;with family/visitor present   Nurse Communication Mobility status        Time: 1281-1886 OT Time Calculation (min): 25 min  Charges: OT General Charges $OT Visit: 1 Visit OT Treatments $Therapeutic Activity: 23-37 mins  Nilsa Nutting., OTR/L Acute Rehabilitation Services Pager [phone] Office [phone]   Walker Kehr 03/12/2022, 1:34 PM

## 2022-03-13 DIAGNOSIS — I2699 Other pulmonary embolism without acute cor pulmonale: Secondary | ICD-10-CM

## 2022-03-13 DIAGNOSIS — I5042 Chronic combined systolic (congestive) and diastolic (congestive) heart failure: Secondary | ICD-10-CM | POA: Diagnosis not present

## 2022-03-13 DIAGNOSIS — R040 Epistaxis: Secondary | ICD-10-CM | POA: Diagnosis not present

## 2022-03-13 DIAGNOSIS — R509 Fever, unspecified: Secondary | ICD-10-CM | POA: Diagnosis not present

## 2022-03-13 DIAGNOSIS — I2602 Saddle embolus of pulmonary artery with acute cor pulmonale: Secondary | ICD-10-CM | POA: Diagnosis not present

## 2022-03-13 HISTORY — DX: Epistaxis: R04.0

## 2022-03-13 LAB — CBC
HCT: 32.9 % — ABNORMAL LOW (ref 39.0–52.0)
Hemoglobin: 10.6 g/dL — ABNORMAL LOW (ref 13.0–17.0)
MCH: 29.9 pg (ref 26.0–34.0)
MCHC: 32.2 g/dL (ref 30.0–36.0)
MCV: 92.9 fL (ref 80.0–100.0)
Platelets: 366 10*3/uL (ref 150–400)
RBC: 3.54 MIL/uL — ABNORMAL LOW (ref 4.22–5.81)
RDW: 12.4 % (ref 11.5–15.5)
WBC: 12 10*3/uL — ABNORMAL HIGH (ref 4.0–10.5)
nRBC: 0 % (ref 0.0–0.2)

## 2022-03-13 LAB — POCT ACTIVATED CLOTTING TIME
Activated Clotting Time: 167 seconds
Activated Clotting Time: 203 seconds
Activated Clotting Time: 245 seconds

## 2022-03-13 MED ORDER — SACUBITRIL-VALSARTAN 49-51 MG PO TABS
1.0000 | ORAL_TABLET | Freq: Two times a day (BID) | ORAL | Status: DC
  Administered 2022-03-13 – 2022-03-14 (×2): 1 via ORAL
  Filled 2022-03-13 (×2): qty 1

## 2022-03-13 MED ORDER — CIPROFLOXACIN HCL 500 MG PO TABS
500.0000 mg | ORAL_TABLET | Freq: Two times a day (BID) | ORAL | Status: DC
  Administered 2022-03-14: 500 mg via ORAL
  Filled 2022-03-13: qty 1

## 2022-03-13 NOTE — Progress Notes (Signed)
Patient refused CPAP for the night. Patient wearing oxygen set at 3lpm with Sp02=99%

## 2022-03-13 NOTE — Addendum Note (Signed)
Addendum  created 03/13/22 1200 by Oleta Mouse, MD   Attestation recorded in [place], [place] filed

## 2022-03-13 NOTE — Progress Notes (Signed)
Daily Progress Note Intern Pager: [phone]  Patient name: Jeffrey Griffin Medical record number: [id number] Date of birth: 04-01-1961 Age: 61 y.o. Gender: male  Primary Care Provider: [name], DO Consultants: IR, PCCM Code Status: Full code  Pt Overview and Major Events to Date:  -PCCM consulted 7/623 -IR consulted 03/09/22 -Thrombectomy transfer to ICU, 03/09/22, -Transferred to FMTS service 03/11/22  Assessment and Plan: Cancio is a 61 year old male presenting with short of breath and back pain found to have saddle PE in the setting of recent prostatectomy and stopping Eliquis for 2 weeks.  PMH significant for A-fib on Eliquis, history of provoked PE, CHF, HTN, prostate cancer (s/p prostatectomy).  * Pulmonary embolism ([place]) S/p thrombectomy 7/6.  Respiratory status stable on 2.5 L supplemental oxygen. Able to ambulate with PT support. Residual chest wall pain.  CT chest shows dependent left pleural effusion.  Possible pneumonia versus atelectasis. Most likely atelectasis given stable, afebrile, not tachycardic.  - Continue Eliquis '10mg'$  3 - Continue continuous telemetry and pulse oximetry - Oxygen therapy as needed to keep O2 saturations 94% or greater, wean as tolerated - Vital signs per nursing protocol; monitor BP - Mucinex as needed for phlegm - Morphine '2mg'$  q4, Tylenol - PT/OT eval and treat  Fever Tmax 102 F 7/8.  CXR unremarkable, CT chest shows likely atelectasis versus pneumonia.  UA significant for large bacteria, UCx 100k gram neg bacteria. Could be s/t inflammatory response thrombectomy. Afebrile currently.  - Continue monitor closely  - Encourage incentive spirometry and up out of bed - Urology recommended Cipro 500 BID for 7 days, will remove foley at outpt follow up on 7/13  S/P prostatectomy Surgical site healing well.  Leaking urinary catheter problematic.  - Dr. Norma Fredrickson (Urologist) said to remove staples tomorrow before discharge  - Continue  oxybutynin - Morphine and Tylenol -Continue to monitor for any bleeding  Persistent atrial fibrillation (HCC) History of A-fib on Eliquis. NSR on tele. - Continue Eliquis, continue metoprolol succinate - Continuous telemetry - FYI'd cardiology today, follows with Dr. Katharina Caper and Dr. Joylene Grapes  Chronic combined systolic and diastolic CHF (congestive heart failure) ([place]) ECHO this admission EF 55%-60%, stable from previous in 2022. On eliquis, on low dose entresto.  - Increase Entresto to 49-51 given stable BP.   OSA (obstructive sleep apnea) History of sleep apnea - Continue CPAP use  Epistaxis Epistaxes started 7/9.  Used packing.  Hemoglobin not affected by bleeding.  Hemoglobin this morning 10.6. -Continue to monitor - CBC daily - Monitor heparin levels  Knee pain, bilateral Bilateral medial knee pain and burning with standing or exertion.  Suspect tenderness/MSK in nature.  Doubt rheumatologic or infectious given no warmth, redness, or swelling. - Lidocaine cream applied to area as needed - Will follow   FEN/GI: Regular PPx: On Eliquis Dispo: Admitted to med telemetry F PTS  pending clinical improvement .   Subjective:  Patient says he is doing well today. He denies any chest, side, back, or bladder pain. Denies bladder pain. Was able to bladder with PT and is amenable to going home with home oxygen.   Objective: Temp:  [98.5 F (36.9 C)-99.4 F (37.4 C)] 98.7 F (37.1 C) (07/10 1316) Pulse Rate:  [64-74] 64 (07/10 1316) Resp:  [18-21] 21 (07/10 1316) BP: (120-145)/(68-82) 123/68 (07/10 1316) SpO2:  [92 %-95 %] 93 % (07/10 1316) Physical Exam: General: Well appearing, sitting in chair  Cardiovascular: RRR, No murmurs, rubs, or gallops, well perfused  Respiratory: Comfortable  and conversant on  2.5L, Clear to auscultation bilaterally, Diminished at the bases  Abdomen: Soft, non tender to palpation, 5 cm midline incision, 4 other small incisions, foley in place    Laboratory: Most recent CBC Lab Results  Component Value Date   WBC 12.0 (H) 03/13/2022   HGB 10.6 (L) 03/13/2022   HCT 32.9 (L) 03/13/2022   MCV 92.9 03/13/2022   PLT 366 03/13/2022   Most recent BMP    Latest Ref Rng & Units 03/12/2022   12:47 AM  BMP  Glucose 70 - 99 mg/dL 127   BUN 6 - 20 mg/dL 16   Creatinine 0.61 - 1.24 mg/dL 0.79   Sodium 135 - 145 mmol/L 134   Potassium 3.5 - 5.1 mmol/L 4.0   Chloride 98 - 111 mmol/L 95   CO2 22 - 32 mmol/L 29   Calcium 8.9 - 10.3 mg/dL 9.3    Ucx: 100k gram negative rods   Imaging/Diagnostic Tests: CT chest:  1. Small to moderate left pleural effusion associated with dependent left lower lobe, and to a lesser degree, left upper lobe lingula, opacity. Lung opacities are likely atelectasis. A component of pneumonia should be considered if there are consistent clinical findings. 2. Trace right pleural effusion with subsegmental lung base atelectasis. 3. Pleural effusions and lung opacities have increased, accounting for opacities noted on the previous day's chest radiograph.  Lowry Ram, MD 03/13/2022, 2:27 PM  PGY-1, [place] Intern pager: [phone], text pages welcome Secure chat group [place]

## 2022-03-13 NOTE — Discharge Instructions (Signed)
Information on my medicine - ELIQUIS (apixaban)  This medication education was reviewed with me or my healthcare representative as part of my discharge preparation.  Why was Eliquis prescribed for you? Eliquis was prescribed to treat blood clots that may have been found in the veins of your legs (deep vein thrombosis) or in your lungs (pulmonary embolism) and to reduce the risk of them occurring again.  What do You need to know about Eliquis ? The starting dose is 10 mg (two 5 mg tablets) taken TWICE daily for the FIRST SEVEN (7) DAYS, then on 03/17/2022  the dose is reduced to ONE 5 mg tablet taken TWICE daily.  Eliquis may be taken with or without food.   Try to take the dose about the same time in the morning and in the evening. If you have difficulty swallowing the tablet whole please discuss with your pharmacist how to take the medication safely.  Take Eliquis exactly as prescribed and DO NOT stop taking Eliquis without talking to the doctor who prescribed the medication.  Stopping may increase your risk of developing a new blood clot.  Refill your prescription before you run out.  After discharge, you should have regular check-up appointments with your healthcare provider that is prescribing your Eliquis.    What do you do if you miss a dose? If a dose of ELIQUIS is not taken at the scheduled time, take it as soon as possible on the same day and twice-daily administration should be resumed. The dose should not be doubled to make up for a missed dose.  Important Safety Information A possible side effect of Eliquis is bleeding. You should call your healthcare provider right away if you experience any of the following: Bleeding from an injury or your nose that does not stop. Unusual colored urine (red or dark brown) or unusual colored stools (red or black). Unusual bruising for unknown reasons. A serious fall or if you hit your head (even if there is no bleeding).  Some  medicines may interact with Eliquis and might increase your risk of bleeding or clotting while on Eliquis. To help avoid this, consult your healthcare provider or pharmacist prior to using any new prescription or non-prescription medications, including herbals, vitamins, non-steroidal anti-inflammatory drugs (NSAIDs) and supplements.  This website has more information on Eliquis (apixaban): http://www.eliquis.com/eliquis/home

## 2022-03-13 NOTE — Progress Notes (Signed)
Mobility Specialist: Progress Note   03/13/22 1715  Mobility  Activity Ambulated independently in hallway  Level of Assistance Independent  Assistive Device Other (Comment) (O2 tank)  Distance Ambulated (ft) 940 ft  Activity Response Tolerated well  $Mobility charge 1 Mobility   Pre-Mobility on 3 L/min [place]: 75 HR, 94% SpO2 During Mobility on 2 L/min [place]: 90% SpO2 Post-Mobility on 2 L/min [place]: 70 HR, 93% SpO2  Pt received in the bed and agreeable to mobility. Began ambulation on 4 L/min [place] but was able to wean pt down to 2 L/min [place] by end of session. C/o mild SOB with x2 standing breaks. Pt back to bed after session with call bell at his side.   [facility] Garlon Tuggle Mobility Specialist Mobility Specialist [address]: [phone]

## 2022-03-13 NOTE — Progress Notes (Signed)
Referring Physician(s): Dr. Valeta Harms  Supervising Physician: Ruthann Cancer  Patient Status:  [facility] - In-pt  Chief Complaint: Pulmonary embolus s/p mechanical thrombectomy 03/09/22 by Dr. Dwaine Gale.   Subjective: Patient sitting up in chair, no apparent discomfort or distress observed. He is on 4 L nasal cannula. He states he will hopefully be going home tomorrow and will be discharged home on oxygen.   Allergies: Patient has no known allergies.  Medications: Prior to Admission medications   Medication Sig Start Date End Date Taking? Authorizing Provider  acetaminophen (TYLENOL) 500 MG tablet Take 500 mg by mouth in the morning, at noon, and at bedtime.   Yes [provider]  apixaban (ELIQUIS) 5 MG TABS tablet TAKE 1 TABLET BY MOUTH TWICE A DAY Patient taking differently: Take 5 mg by mouth 2 (two) times daily. 08/15/21  Yes Allred, Jeneen Rinks, MD  atorvastatin (LIPITOR) 40 MG tablet TAKE 1 TABLET BY MOUTH EVERY DAY Patient taking differently: Take 40 mg by mouth daily. 11/29/21  Yes Nahser, Wonda Cheng, MD  Carnitine-B5-B6 500-15-5 MG TABS Take 1 tablet by mouth daily.   Yes [provider]  Cholecalciferol (VITAMIN D3) 25 MCG (1000 UT) CAPS Take 1,000 Units by mouth daily.    Yes [provider]  Coenzyme Q10 300 MG CAPS Take 1 capsule by mouth daily.   Yes [provider]  Cyanocobalamin (B-12) 5000 MCG CAPS Take 1 capsule by mouth daily.   Yes [provider]  diclofenac Sodium (VOLTAREN) 1 % GEL Apply 4 g topically 4 (four) times daily. Patient taking differently: Apply 4 g topically daily as needed (For pain). 01/28/21  Yes Lilland, Alana, DO  Garlic 5681 MG CAPS Take 1 capsule by mouth daily.   Yes [provider]  Glucosamine-Chondroitin (COSAMIN DS PO) Take 2 tablets by mouth daily.   Yes [provider]  Haw[place]orn 150 MG CAPS Take 1 capsule by mouth daily at 6 (six) AM.   Yes [provider]  ibuprofen (ADVIL) 200 MG tablet  Take 200 mg by mouth every 6 (six) hours as needed for mild pain.   Yes [provider]  meclizine (ANTIVERT) 25 MG tablet TAKE 1 TABLET BY MOUTH 3 TIMES DAILY AS NEEDED FOR DIZZINESS. Patient taking differently: Take 25 mg by mouth 3 (three) times daily as needed for dizziness. 09/07/21  Yes Lilland, Alana, DO  metoprolol succinate (TOPROL-XL) 50 MG 24 hr tablet TAKE 1 TABLET BY MOUTH DAILY. TAKE WITH OR IMMEDIATELY FOLLOWING A MEAL. Patient taking differently: Take 50 mg by mouth daily. 10/06/21  Yes Nahser, Wonda Cheng, MD  Multiple Minerals-Vitamins (CAL MAG ZINC +D3 PO) Take 1 tablet by mouth daily.    Yes [provider]  Multiple Vitamin (MULTIVITAMIN WITH MINERALS) TABS tablet Take 1 tablet by mouth daily.   Yes [provider]  Omega-3 1400 MG CAPS Take 1 capsule by mouth daily.   Yes [provider]  oxybutynin (DITROPAN) 5 MG tablet Take 5 mg by mouth 3 (three) times daily. 03/06/22  Yes [provider]  sacubitril-valsartan (ENTRESTO) 97-103 MG Take 1 tablet by mouth 2 (two) times daily. 12/05/21  Yes Nahser, Wonda Cheng, MD  Taurine 1000 MG CAPS Take 1 capsule by mouth daily.   Yes [provider]  vitamin E 180 MG (400 UNITS) capsule Take 400 Units by mouth daily.   Yes [provider]  Zn-Pyg Afri-Nettle-Saw Palmet (SAW PALMETTO COMPLEX PO) Take 1 tablet by mouth daily.  Yes [provider]     Vital Signs: BP 135/83 (BP Location: Left Arm)   Pulse 65   Temp 98.6 F (37 C) (Oral)   Resp 19   Ht '5\' 9"'$  (1.753 m)   Wt 230 lb 9.6 oz (104.6 kg)   SpO2 93%   BMI 34.05 kg/m   Physical Exam Constitutional:      General: He is not in acute distress.    Appearance: He is not ill-appearing.  Cardiovascular:     Comments: Right groin vascular site is clean, soft, dry and non-tender.  Pulmonary:     Effort: Pulmonary effort is normal.  Skin:    General: Skin is warm and dry.  Neurological:     Mental Status: He is  alert and oriented to person, place, and time.     Imaging: CT CHEST WO CONTRAST  Result Date: 03/12/2022 CLINICAL DATA:  Respiratory illness. EXAM: CT CHEST WITHOUT CONTRAST TECHNIQUE: Multidetector CT imaging of the chest was performed following the standard protocol without IV contrast. RADIATION DOSE REDUCTION: This exam was performed according to the departmental dose-optimization program which includes automated exposure control, adjustment of the mA and/or kV according to patient size and/or use of iterative reconstruction technique. COMPARISON:  Chest radiographs, most recent dated 03/11/2022. Chest CT, 03/08/2022. FINDINGS: Cardiovascular: Heart is normal in size. Mild left coronary artery calcifications. No pericardial effusion. Dilated ascending aorta to 4.1 cm, unchanged. Mediastinum/Nodes: No neck base, mediastinal or hilar masses or enlarged lymph nodes. Trachea and esophagus are unremarkable. Lungs/Pleura: Small to moderate left pleural effusion, increased in size from the prior CT. Trace right pleural effusion, new. Left lower lobe and left upper lobe lingula confluent and hazy airspace opacities consistent with atelectasis and/or pneumonia. Mild linear atelectasis in the dependent right lower lobe and posterolateral base of the right middle lobe. 3 mm right lower lobe pulmonary nodule, image 83, series 4, stable from a chest CT dated 04/25/2021, benign with no follow-up recommended. No evidence of pulmonary edema.  No pneumothorax. Upper Abdomen: No acute abnormality. Musculoskeletal: No fracture or acute finding. No bone lesion. No chest wall mass. IMPRESSION: 1. Small to moderate left pleural effusion associated with dependent left lower lobe, and to a lesser degree, left upper lobe lingula, opacity. Lung opacities are likely atelectasis. A component of pneumonia should be considered if there are consistent clinical findings. 2. Trace right pleural effusion with subsegmental lung base  atelectasis. 3. Pleural effusions and lung opacities have increased, accounting for opacities noted on the previous day's chest radiograph. 4. Dilated ascending thoracic aorta to 4.1 cm. Recommend annual imaging followup by CTA or MRA. This recommendation follows 2010 ACCF/AHA/AATS/ACR/ASA/SCA/SCAI/SIR/STS/SVM Guidelines for the Diagnosis and Management of Patients with Thoracic Aortic Disease. Circulation. 2010; 121: X937-J696. Aortic aneurysm NOS (ICD10-I71.9) Aortic aneurysm NOS (ICD10-I71.9). Electronically Signed   By: Lajean Manes M.D.   On: 03/12/2022 09:34   DG Chest 2 View  Result Date: 03/11/2022 CLINICAL DATA:  Shortness of breath. EXAM: CHEST - 2 VIEW COMPARISON:  Chest x-ray 03/08/2022 FINDINGS: There is new airspace disease in the left lower lobe. Trace right and small left pleural effusions are present. Heart is mildly enlarged. No pneumothorax or acute fracture. IMPRESSION: 1. Small left pleural effusion with increasing left lower lobe airspace disease worrisome for infection. 2. Trace right pleural effusion. 3. Mild cardiomegaly. Recommend follow-up chest x-ray in 4-6 weeks to confirm complete resolution. Electronically Signed   By: Ronney Asters M.D.   On: 03/11/2022 19:54  ECHOCARDIOGRAM COMPLETE  Result Date: 03/10/2022    ECHOCARDIOGRAM REPORT   Patient Name:   Jeffrey Griffin Date of Exam: 03/10/2022 Medical Rec #:  [id number]     Height:       69.0 in Accession #:    [id number]    Weight:       230.6 lb Date of Birth:  04-Mar-1961     BSA:          2.195 m Patient Age:    14 years      BP:           151/76 mmHg Patient Gender: M             HR:           73 bpm. Exam Location:  Inpatient Procedure: 2D Echo, Cardiac Doppler and Color Doppler Indications:    Pulmonary embolus  History:        Patient has prior history of Echocardiogram examinations, most                 recent 10/22/2020. CHF; Arrythmias:Atrial Fibrillation.  Sonographer:    Joette Catching RCS Referring Phys: [place]  1. Left ventricular ejection fraction, by estimation, is 55 to 60%. The left ventricle has normal function. The left ventricle has no regional wall motion abnormalities. There is moderate left ventricular hypertrophy. Left ventricular diastolic parameters were normal.  2. Right ventricular systolic function is normal. The right ventricular size is normal. Tricuspid regurgitation signal is inadequate for assessing PA pressure.  3. The mitral valve is normal in structure. No evidence of mitral valve regurgitation. No evidence of mitral stenosis.  4. The aortic valve is tricuspid. Aortic valve regurgitation is not visualized. No aortic stenosis is present.  5. Aortic dilatation noted. There is dilatation of the aortic root, measuring 42 mm. There is dilatation of the ascending aorta, measuring 41 mm.  6. The inferior vena cava is normal in size with greater than 50% respiratory variability, suggesting right atrial pressure of 3 mmHg. FINDINGS  Left Ventricle: Left ventricular ejection fraction, by estimation, is 55 to 60%. The left ventricle has normal function. The left ventricle has no regional wall motion abnormalities. The left ventricular internal cavity size was normal in size. There is  moderate left ventricular hypertrophy. Left ventricular diastolic parameters were normal. Right Ventricle: The right ventricular size is normal. No increase in right ventricular wall thickness. Right ventricular systolic function is normal. Tricuspid regurgitation signal is inadequate for assessing PA pressure. Left Atrium: Left atrial size was normal in size. Right Atrium: Right atrial size was normal in size. Pericardium: There is no evidence of pericardial effusion. Mitral Valve: The mitral valve is normal in structure. No evidence of mitral valve regurgitation. No evidence of mitral valve stenosis. Tricuspid Valve: The tricuspid valve is normal in structure. Tricuspid valve regurgitation is trivial.  Aortic Valve: The aortic valve is tricuspid. Aortic valve regurgitation is not visualized. No aortic stenosis is present. Aortic valve mean gradient measures 8.0 mmHg. Aortic valve peak gradient measures 14.0 mmHg. Aortic valve area, by VTI measures 2.17  cm. Pulmonic Valve: The pulmonic valve was not well visualized. Pulmonic valve regurgitation is not visualized. Aorta: Aortic dilatation noted. There is dilatation of the aortic root, measuring 42 mm. There is dilatation of the ascending aorta, measuring 41 mm. Venous: The inferior vena cava is normal in size with greater than 50% respiratory variability, suggesting right atrial pressure of 3 mmHg. IAS/Shunts:  The interatrial septum was not well visualized.  LEFT VENTRICLE PLAX 2D LVIDd:         4.70 cm   Diastology LVIDs:         3.30 cm   LV e' medial:    9.57 cm/s LV PW:         1.20 cm   LV E/e' medial:  6.8 LV IVS:        1.20 cm   LV e' lateral:   10.90 cm/s LVOT diam:     2.00 cm   LV E/e' lateral: 6.0 LV SV:         80 LV SV Index:   37 LVOT Area:     3.14 cm  RIGHT VENTRICLE             IVC RV Basal diam:  2.90 cm     IVC diam: 1.70 cm RV Mid diam:    1.40 cm RV S prime:     17.50 cm/s TAPSE (M-mode): 1.8 cm LEFT ATRIUM             Index        RIGHT ATRIUM           Index LA diam:        3.40 cm 1.55 cm/m   RA Area:     15.30 cm LA Vol (A2C):   48.7 ml 22.19 ml/m  RA Volume:   32.00 ml  14.58 ml/m LA Vol (A4C):   82.6 ml 37.63 ml/m LA Biplane Vol: 69.2 ml 31.53 ml/m  AORTIC VALVE                     PULMONIC VALVE AV Area (Vmax):    2.25 cm      PV Vmax:       1.48 m/s AV Area (Vmean):   2.45 cm      PV Peak grad:  8.8 mmHg AV Area (VTI):     2.17 cm AV Vmax:           187.00 cm/s AV Vmean:          132.000 cm/s AV VTI:            0.370 m AV Peak Grad:      14.0 mmHg AV Mean Grad:      8.0 mmHg LVOT Vmax:         134.00 cm/s LVOT Vmean:        103.000 cm/s LVOT VTI:          0.256 m LVOT/AV VTI ratio: 0.69  AORTA Ao Root diam: 4.20 cm Ao Asc  diam:  4.10 cm MITRAL VALVE MV Area (PHT): 3.42 cm    SHUNTS MV Decel Time: 222 msec    Systemic VTI:  0.26 m MV E velocity: 65.50 cm/s  Systemic Diam: 2.00 cm MV A velocity: 43.70 cm/s MV E/A ratio:  1.50 Oswaldo Milian MD Electronically signed by Oswaldo Milian MD Signature Date/Time: 03/10/2022/1:58:35 PM    Final     Labs:  CBC: Recent Labs    03/10/22 0515 03/11/22 0500 03/12/22 0047 03/13/22 0013  WBC 14.3* 14.6* 12.2* 12.0*  HGB 11.7* 10.6* 10.3* 10.6*  HCT 35.3* 31.3* 31.5* 32.9*  PLT 223 244 276 366    COAGS: No results for input(s): "INR", "APTT" in the last 8760 hours.  BMP: Recent Labs    03/28/21 2025 07/13/21 0826 12/26/21 0723 03/08/22 1932 03/12/22 0047  NA 138  139 138 137 134*  K 4.1 4.2 4.2 4.2 4.0  CL 104 102 102 103 95*  CO2 '27 26 24 22 29  '$ GLUCOSE 99 105* 122* 115* 127*  BUN '8 20 14 20 16  '$ CALCIUM 10.2 10.6* 10.5* 9.8 9.3  CREATININE 0.90 0.92 1.10 0.84 0.79  GFRNONAA >60  --   --  >60 >60    LIVER FUNCTION TESTS: Recent Labs    03/28/21 2025 03/08/22 1932  BILITOT 0.9 0.9  AST 28 26  ALT 29 30  ALKPHOS 75 76  PROT 7.1 6.5  ALBUMIN 4.0 2.9*    Assessment and Plan:  Pulmonary embolus s/p mechanical thrombectomy 03/09/22 by Dr. Dwaine Gale.   Patient is 4 days out from his procedure. He has been weaned down to 4L of oxygen and has pending discharge plans tentatively for tomorrow. He has been working with PT/OT. Labs and vitals are stable. No further plans per IR but we remain available if needed.   Electronically Signed: Soyla Dryer, AGACNP-BC [phone] 03/13/2022, 3:10 PM   I spent a total of 15 Minutes at the the patient's bedside AND on the patient's hospital floor or unit, greater than 50% of which was counseling/coordinating care for pulmonary embolism s/p mechanical thrombectomy.

## 2022-03-13 NOTE — Progress Notes (Signed)
Patient has home CPAP but currently declines use. He requests to remain using [place] at 4L.

## 2022-03-13 NOTE — Assessment & Plan Note (Signed)
Epistaxes started 7/9.  Used packing.  Hemoglobin not affected by bleeding.  Hemoglobin this morning 10.6. -Continue to monitor - CBC daily - Monitor heparin levels

## 2022-03-13 NOTE — Progress Notes (Signed)
Physical Therapy Treatment Patient Details Name: Jeffrey Griffin MRN: [id number] DOB: 08/04/1961 Today's Date: 03/13/2022   History of Present Illness 61 y.o. M admitted on 03/09/22 due to chest pain and shortness of breath. He was found to have a R sided saddle PE. PMH significant for HTN, HLD, A fib, CHF.    PT Comments    Much improved toward goals.  Emphasis on transitions, sit to stand safety, progression of gait without AD on 4-6 L O2 [place].   Recommendations for follow up therapy are one component of a multi-disciplinary discharge planning process, led by the attending physician.  Recommendations may be updated based on patient status, additional functional criteria and insurance authorization.  Follow Up Recommendations  No PT follow up     Assistance Recommended at Discharge Intermittent Supervision/Assistance  Patient can return home with the following A little help with bathing/dressing/bathroom;Assistance with cooking/housework;Assist for transportation;Help with stairs or ramp for entrance   Equipment Recommendations  None recommended by PT    Recommendations for Other Services       Precautions / Restrictions Precautions Precautions: Fall Restrictions Weight Bearing Restrictions: No     Mobility  Bed Mobility Overal bed mobility: Needs Assistance Bed Mobility: Supine to Sit     Supine to sit: Min guard          Transfers Overall transfer level: Needs assistance Equipment used: None   Sit to Stand: Min guard                Ambulation/Gait Ambulation/Gait assistance: Min guard, Supervision Gait Distance (Feet): 600 Feet Assistive device:  (pulling portable O2 behind.) Gait Pattern/deviations: Step-through pattern   Gait velocity interpretation: 1.31 - 2.62 ft/sec, indicative of limited community ambulator   General Gait Details: generally steady, able to manage challenge to balance, change gait speed significantly.  SpO2 91-95% on 6 then 4L [place].   No overt dyspnea or signs of fatigue.   Stairs             Wheelchair Mobility    Modified Rankin (Stroke Patients Only)       Balance Overall balance assessment: Needs assistance   Sitting balance-Leahy Scale: Good       Standing balance-Leahy Scale: Good                              Cognition Arousal/Alertness: Awake/alert Behavior During Therapy: WFL for tasks assessed/performed Overall Cognitive Status: Within Functional Limits for tasks assessed                                          Exercises      General Comments General comments (skin integrity, edema, etc.): SpO2 91-95% on 6L then 4L [place] with activity.      Pertinent Vitals/Pain Pain Assessment Faces Pain Scale: Hurts a little bit Pain Location: knees, general Pain Descriptors / Indicators: Aching, Discomfort Pain Intervention(s): Monitored during session    Home Living                          Prior Function            PT Goals (current goals can now be found in the care plan section) Acute Rehab PT Goals Patient Stated Goal: back to PLOF PT Goal Formulation: With patient Time For  Goal Achievement: 03/24/22 Potential to Achieve Goals: Good Progress towards PT goals: Progressing toward goals    Frequency    Min 3X/week      PT Plan Current plan remains appropriate    Co-evaluation              AM-PAC PT "6 Clicks" Mobility   Outcome Measure  Help needed turning from your back to your side while in a flat bed without using bedrails?: A Little Help needed moving from lying on your back to sitting on the side of a flat bed without using bedrails?: A Little Help needed moving to and from a bed to a chair (including a wheelchair)?: A Little Help needed standing up from a chair using your arms (e.g., wheelchair or bedside chair)?: A Little Help needed to walk in hospital room?: A Little Help needed climbing 3-5 steps with a railing? :  A Little 6 Click Score: 18    End of Session Equipment Utilized During Treatment: Oxygen Activity Tolerance: Patient tolerated treatment well Patient left: in chair;with call bell/phone within reach;with family/visitor present Nurse Communication: Mobility status PT Visit Diagnosis: Other abnormalities of gait and mobility (R26.89)     Time: 3338-3291 PT Time Calculation (min) (ACUTE ONLY): 27 min  Charges:  $Gait Training: 8-22 mins $Therapeutic Activity: 8-22 mins                     03/13/2022  Ginger Carne., PT Acute Rehabilitation Services [phone]  (pager) [phone]  (office)   Tessie Fass Cameshia Cressman 03/13/2022, 12:07 PM

## 2022-03-14 ENCOUNTER — Ambulatory Visit: Payer: No Typology Code available for payment source | Admitting: Sports Medicine

## 2022-03-14 ENCOUNTER — Encounter (HOSPITAL_COMMUNITY): Payer: Self-pay | Admitting: Internal Medicine

## 2022-03-14 ENCOUNTER — Other Ambulatory Visit (HOSPITAL_COMMUNITY): Payer: Self-pay

## 2022-03-14 DIAGNOSIS — I5042 Chronic combined systolic (congestive) and diastolic (congestive) heart failure: Secondary | ICD-10-CM | POA: Diagnosis not present

## 2022-03-14 DIAGNOSIS — I2602 Saddle embolus of pulmonary artery with acute cor pulmonale: Secondary | ICD-10-CM | POA: Diagnosis not present

## 2022-03-14 DIAGNOSIS — R509 Fever, unspecified: Secondary | ICD-10-CM | POA: Diagnosis not present

## 2022-03-14 DIAGNOSIS — R040 Epistaxis: Secondary | ICD-10-CM | POA: Diagnosis not present

## 2022-03-14 LAB — CBC
HCT: 32.9 % — ABNORMAL LOW (ref 39.0–52.0)
Hemoglobin: 11 g/dL — ABNORMAL LOW (ref 13.0–17.0)
MCH: 30.3 pg (ref 26.0–34.0)
MCHC: 33.4 g/dL (ref 30.0–36.0)
MCV: 90.6 fL (ref 80.0–100.0)
Platelets: 474 10*3/uL — ABNORMAL HIGH (ref 150–400)
RBC: 3.63 MIL/uL — ABNORMAL LOW (ref 4.22–5.81)
RDW: 12.3 % (ref 11.5–15.5)
WBC: 9.5 10*3/uL (ref 4.0–10.5)
nRBC: 0 % (ref 0.0–0.2)

## 2022-03-14 LAB — URINE CULTURE: Culture: >=100000 — AB

## 2022-03-14 MED ORDER — POLYETHYLENE GLYCOL 3350 17 GM/SCOOP PO POWD
17.0000 g | Freq: Every day | ORAL | 0 refills | Status: DC
  Filled 2022-03-14: qty 238, 14d supply, fill #0

## 2022-03-14 MED ORDER — CIPROFLOXACIN HCL 500 MG PO TABS
500.0000 mg | ORAL_TABLET | Freq: Two times a day (BID) | ORAL | 0 refills | Status: AC
  Filled 2022-03-14: qty 8, 4d supply, fill #0

## 2022-03-14 MED ORDER — AYR SALINE NASAL NA GEL: 1.0000 | NASAL | 0 refills | Status: DC | PRN

## 2022-03-14 MED ORDER — AYR SALINE NASAL NA GEL
1.0000 | NASAL | Status: DC | PRN
  Administered 2022-03-14: 1 via NASAL
  Filled 2022-03-14: qty 14.1

## 2022-03-14 MED ORDER — APIXABAN 5 MG PO TABS
ORAL_TABLET | ORAL | 2 refills | Status: DC
  Filled 2022-03-14: qty 66, 30d supply, fill #0

## 2022-03-14 NOTE — TOC Progression Note (Signed)
Transition of Care [facility]) - Progression Note    Patient Details  Name: Jeffrey Griffin MRN: [id number] Date of Birth: 1961-02-13  Transition of Care [facility]) CM/SW Contact  Loletha Grayer Beverely Pace, RN Phone Number: 03/14/2022, 3:01 PM  Clinical Narrative:   Case manager spoke with patient's wife concerning his need for oxygen at discharge. Discussed Vendor. Request called to [facility] with [employer]. Transport tank will be delivered to patient's room. [name] asked that her cell phone  [phone], be called to arrange for oxygen delivery to the home.    Expected Discharge Plan: Home/Self Care Barriers to Discharge: No Barriers Identified  Expected Discharge Plan and Services Expected Discharge Plan: Home/Self Care   Discharge Planning Services: CM Consult   Living arrangements for the past 2 months: Single Family Home                 DME Arranged: Oxygen DME Agency: [employer] Date DME Agency Contacted: 03/14/22 Time DME Agency Contacted: 1500 Representative spoke with at DME Agency: [place] Arranged: NA [place] Agency: NA         Social Determinants of Health ([place]) Interventions    Readmission Risk Interventions     No data to display

## 2022-03-14 NOTE — Progress Notes (Signed)
Mobility Specialist: Progress Note   03/14/22 1450  Mobility  Activity Ambulated independently in hallway  Level of Assistance Independent  Assistive Device None  Distance Ambulated (ft) 940 ft  Activity Response Tolerated well  $Mobility charge 1 Mobility   Pre-Mobility on 1 L/min [place]: 73 HR, 97% SpO2 During Mobility    On RA: 86% SpO2    On 1 L/min [place]: 92% SpO2 Post-Mobility on 1 L/min [place]: 69 HR, 92% SpO2  Pt received in the bed and agreeable to mobility. C/o mild R knee discomfort and SOB during session. Ambulated on 1 L/min  to maintain >90% SpO2. Pt back to bed after session with call bell at his side.  [facility] [place] Hand Mobility Specialist Mobility Specialist [address]: [phone]

## 2022-03-14 NOTE — Discharge Summary (Addendum)
[facility] Discharge Summary  Patient name: Jeffrey Griffin Medical record number: [id number] Date of birth: 03-08-1961 Age: 61 y.o. Gender: male Date of Admission: 03/08/2022  Date of Discharge:03/14/2022 Admitting Physician: Jacky Kindle, MD  Primary Care Provider: [name], DO Consultants: CCM, IR  Indication for Hospitalization: Increased O2 requirement, Pulmonary Embolism   Brief Hospital Course:  Mr. Jeffrey Griffin is a 61 year old male with past medical history significant for A-fib on Eliquis, history of PE, CHF, HTN, prostate cancer (s/p prostatectomy) who presented with shortness of breath and back pain, and found to have saddle pulmonary embolism.  Pulmonary embolism Patient found to have bilateral diffuse pulmonary emboli with saddle pulmonary embolus on CT angiography with presence of right heart strain.  S-pesi score 3 (history of cancer, SPO2 less than 90% initially, CHF) indicating high risk with 8.9% mortality.  Bilateral lower extremity ultrasound significant for DVT involving posterior tibial veins bilaterally. Echo significant for EF 55-60%, moderate left ventricular hypertrophy, aortic dilatation with dilatation of aortic root 42 mm. Dilatation of ascending aorta measuring 70m.  CCM and interventional radiology consulted, patient had thrombectomy on 7/6 and was managed in the ICU until 7/8.  Patient was treated with nasal cannula oxygen up to 15, and heparin GTT and then transition to home Eliquis.  Patient was stable able to ambulate on 2 L of oxygen at discharge. Patient to continue Eliquis 10 mg treatment dose through 7/13 and transition to 5 mg maintenance dose on 7/14.   Persistent A-fib Patient managed on Eliquis at home.  Was off Eliquis for 2 weeks due to prostatectomy.  On admission normal sinus rhythm on telemetry metoprolol succinate was continued.  Patient was monitored via telemetry.  Heparin GTT was transitioned to Eliquis.  Heart rate was normal and regular rhythm on discharge.   Fever 7/8 patient had Tmax of 102 F.  Chest x-ray with was unremarkable, CT chest showed likely atelectasis versus pneumonia.  UA significant for large bacteria, UCX had 100,000 gram-negative bacteria.  Could be secondary to inflammatory response from thrombectomy as patient was thereafter afebrile.  Urology recommended Cipro 500 twice daily for 7 days.  Foley will be removed at outpatient follow-up on 7/13.  Patient was afebrile at discharge.  Discharge Diagnoses/Problem List:  Pleural effusion, pulmonary embolism, CHF, Afib Principal Problem for Admission: Shortness of breath  , Pulmonary embolism  Other Problems addressed during stay:  Afib, UTI, CHF, Pulmonary Embolism, Pleural Effusion   Disposition: Home   Discharge Condition: Good  Discharge Exam:  Vitals:   03/14/22 0821 03/14/22 1148  BP: 138/87 131/79  Pulse: 71 65  Resp: 20 20  Temp: 98.6 F (37 C) 98.5 F (36.9 C)  SpO2: 93% 97%   General: Well-appearing, conversant, leaning back in bed  Cardiovascular: RRR Respiratory: Breathing 2 L nasal cannula, no increased work of breathing Abdomen: Soft, non tender, non distended  Extremities: Well perfused, no edema in BLE  Issues for Follow Up:  On CT chest 7/09 dilated ascending thoracic aorta to 4.1 cm. Recommend annual imaging followup by CTA or MRA.  Recommend follow-up chest x-ray in 4-6 weeks to confirm complete resolution of left pleural effusion Follow-up with urology on 7/13 for removal of Foley catheter. Follow-up with PCP for monitor of oxygen use and work of breathing. Follow-up blood pressure with PCP, given transition to home dose of Entresto.  Significant Procedures: Thrombectomy 03/09/2022  Significant Labs and Imaging:  CT Angio Chest Pulmonary Embolism  1. Extensive bilateral  pulmonary emboli with saddle pulmonary embolus. The right ventricle is distended suggesting underlying right heart  strain. 2. Distended pulmonary trunk which may be associated with underlying pulmonary artery hypertension. 3. Aneurysmal dilatation of the ascending aorta measuring 4.1 cm. Recommend annual imaging followup by CTA or MRA.   VAS [place] Lower Extremity  RIGHT:  - Findings consistent with acute deep vein thrombosis involving the right  posterior tibial veins.  - No cystic structure found in the popliteal fossa.  LEFT:  - Findings consistent with acute deep vein thrombosis involving the left  posterior tibial veins.  - No cystic structure found in the popliteal fossa.  ECHO 1. Left ventricular ejection fraction, by estimation, is 55 to 60%. The  left ventricle has normal function. The left ventricle has no regional  wall motion abnormalities. There is moderate left ventricular hypertrophy.  Left ventricular diastolic  parameters were normal.   2. Right ventricular systolic function is normal. The right ventricular  size is normal. Tricuspid regurgitation signal is inadequate for assessing  PA pressure.   3. The mitral valve is normal in structure. No evidence of mitral valve  regurgitation. No evidence of mitral stenosis.   4. The aortic valve is tricuspid. Aortic valve regurgitation is not  visualized. No aortic stenosis is present.   5. Aortic dilatation noted. There is dilatation of the aortic root,  measuring 42 mm. There is dilatation of the ascending aorta, measuring 41  mm.   6. The inferior vena cava is normal in size with greater than 50%  respiratory variability, suggesting right atrial pressure of 3 mmHg.   Results/Tests Pending at Time of Discharge: None  Discharge Medications:  Allergies as of 03/14/2022   No Known Allergies      Medication List     STOP taking these medications    ibuprofen 200 MG tablet Commonly known as: ADVIL   SAW PALMETTO COMPLEX PO       TAKE these medications    acetaminophen 500 MG tablet Commonly known as: TYLENOL Take 500 mg  by mouth in the morning, at noon, and at bedtime.   atorvastatin 40 MG tablet Commonly known as: LIPITOR TAKE 1 TABLET BY MOUTH EVERY DAY   B-12 5000 MCG Caps Take 1 capsule by mouth daily.   CAL MAG ZINC +D3 PO Take 1 tablet by mouth daily.   Carnitine-B5-B6 500-15-5 MG Tabs Take 1 tablet by mouth daily.   ciprofloxacin 500 MG tablet Commonly known as: CIPRO Take 1 tablet (500 mg total) by mouth 2 (two) times daily for 4 days.   Coenzyme Q10 300 MG Caps Take 1 capsule by mouth daily.   COSAMIN DS PO Take 2 tablets by mouth daily.   diclofenac Sodium 1 % Gel Commonly known as: Voltaren Apply 4 g topically 4 (four) times daily. What changed:  when to take this reasons to take this   Eliquis 5 MG Tabs tablet Generic drug: apixaban Take 2 tablets (10 mg total) by mouth 2 (two) times daily for 3 days, THEN 1 tablet (5 mg total) 2 (two) times daily. Start taking on: March 14, 2022 What changed: See the new instructions.   Entresto 97-103 MG Generic drug: sacubitril-valsartan Take 1 tablet by mouth 2 (two) times daily.   Garlic 1610 MG Caps Take 1 capsule by mouth daily.   Hawthorn 150 MG Caps Take 1 capsule by mouth daily at 6 (six) AM.   meclizine 25 MG tablet Commonly known as: ANTIVERT TAKE 1  TABLET BY MOUTH 3 TIMES DAILY AS NEEDED FOR DIZZINESS. What changed: See the new instructions.   metoprolol succinate 50 MG 24 hr tablet Commonly known as: TOPROL-XL TAKE 1 TABLET BY MOUTH DAILY. TAKE WITH OR IMMEDIATELY FOLLOWING A MEAL. What changed: additional instructions   multivitamin with minerals Tabs tablet Take 1 tablet by mouth daily.   Omega-3 1400 MG Caps Take 1 capsule by mouth daily.   oxybutynin 5 MG tablet Commonly known as: DITROPAN Take 5 mg by mouth 3 (three) times daily.   polyethylene glycol powder 17 GM/SCOOP powder Commonly known as: GLYCOLAX/MIRALAX Take 17 g by mouth daily. Start taking on: March 15, 2022   saline Gel Place 1  Application into the nose every 4 (four) hours as needed (For nasal dryness).   Taurine 1000 MG Caps Take 1 capsule by mouth daily.   Vitamin D3 25 MCG (1000 UT) Caps Take 1,000 Units by mouth daily.   vitamin E 180 MG (400 UNITS) capsule Take 400 Units by mouth daily.        Discharge Instructions:   POST-HOSPITAL & CARE INSTRUCTIONS START taking Eliquis '10mg'$  (treatment dose) until 7/13, and then on 7/14 START taking your Eliquis '5mg'$  daily.  Continue taking your other medications as prescribed. Please let PCP/Specialists know of any changes in medications that were made.  Please see medications section of this packet for any medication changes.   Follow-Up Appointments:  Follow-up Information     Lilland, Alana, DO. Go on 03/17/2022.   Specialty: Family Medicine Why: At 2:10 pm. Please arrive by 1:55 pm. This is your hospital follow up appointment with your primary care doctor. Please bring all of your medications with you to this appointment. Contact information: [place] [address] [phone]         Nahser, Wonda Cheng, MD .   Specialty: Cardiology Contact information: [place] [address] [phone]         [name], MD .   Specialty: Cardiology Contact information: [address] [place] [address] [address] [phone]               03/16/2022 Follow up with Dr. Norma Fredrickson at [facility]    Date Time Provider [place]  03/28/2022  9:15 AM Thurman Coyer, DO [place]   Lowry Ram, MD 03/14/2022, 11:42 AM PGY-1, [facility]   Upper Level Addendum: I have reviewed the above note, making necessary revisions as appropriate. I agree with the medical decision making and physical exam as noted above. Ezequiel Essex, MD PGY-3 [facility]

## 2022-03-14 NOTE — Progress Notes (Signed)
Patient discharging home with assist from wife. Oxygen tank and home meds delivered to room. Iv removed without complications. Tele removed and CCMD notified. Discharge instructions completed and medication administration discussed. All questions answered.  Jeffrey Griffin

## 2022-03-14 NOTE — Progress Notes (Addendum)
Daily Progress Note Intern Pager: [phone]  Patient name: Jeffrey Griffin Medical record number: [id number] Date of birth: [birth year] Age: 61 y.o. Gender: male  Primary Care Provider: [name], DO Consultants: CCM, IR Code Status: Full  Pt Overview and Major Events to Date:  -PCCM consulted 7/623 -IR consulted 03/09/22 -Thrombectomy transfer to ICU, 03/09/22, -Transferred to FMTS service 03/11/22  Assessment and Plan: Stracke is a 61 year old presenting with shortness of breath and back pain found to have saddle PE in the setting of recent prostatectomy stopping Eliquis for 2 weeks.  PMH significant for A-fib on Eliquis, history of provoked PE, CHF, HTN, prostate cancer (status post prostatectomy).  * Pulmonary embolism ([place]) S/p thrombectomy 7/6.  Respiratory status stable on 2.5 L supplemental oxygen. Able to ambulate with PT support. Residual chest wall pain.  CT chest shows dependent left pleural effusion.  Possible pneumonia versus atelectasis. Most likely atelectasis given stable, afebrile, not tachycardic.  - Continue Eliquis '10mg'$  3 - Continue continuous telemetry and pulse oximetry - Oxygen therapy as needed to keep O2 saturations 94% or greater, wean as tolerated - Vital signs per nursing protocol; monitor BP - Mucinex as needed for phlegm - Morphine '2mg'$  q4, Tylenol - PT/OT eval and treat  Fever Tmax 102 F 7/8.  CXR unremarkable, CT chest shows likely atelectasis versus pneumonia.  UA significant for large bacteria, UCx 100k gram neg bacteria. Could be s/t inflammatory response thrombectomy. Afebrile currently.  - Continue monitor closely  - Encourage incentive spirometry and up out of bed - Urology recommended Cipro 500 BID for 7 days, will remove foley at outpt follow up on 7/13  S/P prostatectomy Surgical site healing well.  Leaking urinary catheter problematic.  -We will remove staples today, per Dr. Norma Fredrickson (urologist) - Continue oxybutynin - Morphine and  Tylenol -Continue to monitor for any bleeding  Persistent atrial fibrillation (HCC) History of A-fib on Eliquis. NSR on tele. - Continue Eliquis, continue metoprolol succinate - Continuous telemetry - FYI'd cardiology, follows with Dr. Katharina Caper and Dr. Joylene Grapes  Chronic combined systolic and diastolic CHF (congestive heart failure) ([place]) ECHO this admission EF 55%-60%, stable from previous in 2022. On eliquis, on low dose entresto.  - On Entresto to 49-51, tell patient to increase up to home dose tomorrow.   OSA (obstructive sleep apnea) History of sleep apnea - Continue CPAP use  Knee pain, bilateral Bilateral medial knee pain and burning with standing or exertion.  Suspect tenderness/MSK in nature.  Doubt rheumatologic or infectious given no warmth, redness, or swelling. - Lidocaine cream applied to area as needed - Will follow  Epistaxis-resolved as of 03/14/2022 Epistaxes started 7/9.  Used packing.  Hemoglobin not affected by bleeding.  Hemoglobin stable around 10.6. - Has not recurred    FEN/GI: Regular PPx: On home Eliquis Dispo:Home today. Barriers include removal of surgical staples.   Subjective:  Patient says that he is feeling well. He denies increased shortness of breath or pain. He is excited to leave soon. He feels confident weaning oxygen at home. He says he is very strict regarding his eliquis regimen at home.   Objective: Temp:  [98.3 F (36.8 C)-99.1 F (37.3 C)] 98.6 F (37 C) (07/11 0821) Pulse Rate:  [64-71] 71 (07/11 0821) Resp:  [18-21] 20 (07/11 0821) BP: (123-138)/(68-87) 138/87 (07/11 0821) SpO2:  [92 %-93 %] 93 % (07/11 2863) Physical Exam: General: Well-appearing, conversant, leaning back in bed  Cardiovascular: RRR Respiratory: Breathing 2 L nasal cannula, no increased  work of breathing Abdomen: Soft, non tender, non distended  Extremities: Well perfused, no edema in BLE  Laboratory: No significant labs today  Lowry Ram, MD 03/14/2022,  10:23 AM  PGY-1, [place] Intern pager: [phone], text pages welcome Secure chat group [place]

## 2022-03-14 NOTE — Progress Notes (Signed)
Mobility Specialist: Progress Note SATURATION QUALIFICATIONS: (This note is used to comply with regulatory documentation for home oxygen)  Patient Saturations on Room Air at Rest = 90%  Patient Saturations on Room Air while Ambulating = 86%  Patient Saturations on 1 Liters of oxygen while Ambulating = 92%  Emergency planning/management officer Specialist [place]: [phone]

## 2022-03-16 ENCOUNTER — Encounter: Payer: Self-pay | Admitting: Cardiovascular Disease

## 2022-03-17 ENCOUNTER — Ambulatory Visit (INDEPENDENT_AMBULATORY_CARE_PROVIDER_SITE_OTHER): Payer: No Typology Code available for payment source | Admitting: Family Medicine

## 2022-03-17 ENCOUNTER — Encounter: Payer: Self-pay | Admitting: Family Medicine

## 2022-03-17 ENCOUNTER — Encounter: Payer: Self-pay | Admitting: Cardiovascular Disease

## 2022-03-17 VITALS — BP 98/66 | HR 73 | Ht 69.0 in | Wt 219.5 lb

## 2022-03-17 DIAGNOSIS — R5383 Other fatigue: Secondary | ICD-10-CM

## 2022-03-17 DIAGNOSIS — I2602 Saddle embolus of pulmonary artery with acute cor pulmonale: Secondary | ICD-10-CM | POA: Diagnosis not present

## 2022-03-17 DIAGNOSIS — Z09 Encounter for follow-up examination after completed treatment for conditions other than malignant neoplasm: Secondary | ICD-10-CM

## 2022-03-17 DIAGNOSIS — Z9079 Acquired absence of other genital organ(s): Secondary | ICD-10-CM | POA: Diagnosis not present

## 2022-03-17 DIAGNOSIS — R200 Anesthesia of skin: Secondary | ICD-10-CM

## 2022-03-17 NOTE — Patient Instructions (Signed)
I am glad you are finally out of the hospital, I am so sorry for all the events that been happening lately.  Here some things that we talked about today:  - Regarding your oxygen, you can take it off during the day but I would still wear a monitor if you are getting up and walking around.  If your O2 saturation drops below 88% then I would want you to put it back on.  It is up to you when you decide to stop using it at night, I probably would wait until you feel like you are able to walk around without oxygen before doing it.  - The numbness in your right inner thigh is probably due to stretching of your nerves during your procedure.  It most likely correlates with the anterior femoral cutaneous nerve, it should heal over time and repair itself.  - If you have continued chest pain or you start noticing required increase in oxygen from the 2 L then I want you to call the office or go to the hospital.  -Keep an eye on your blood pressures, with weight loss and all the things going on I do not want your blood pressure dropping too low.

## 2022-03-17 NOTE — Progress Notes (Signed)
Follow up outpatient visit after Hospitalization  Jeffrey Griffin is accompanied by wife Sources of clinical information for visit is/are patient and spouse/SO. The Discharge Summary for the hospitalization from 03/08/2022 to 03/14/2022 was reviewed.  Nursing assessment for this office visit was reviewed with the patient for accuracy and revision.   HPI Principle Diagnosis requiring hospitalization: Increased O2 requirement, Pulmonary Embolism    Brief Hospital course summary:  Mr. Alpha Mysliwiec is a 61 year old male with past medical history significant for A-fib on Eliquis, history of PE, CHF, HTN, prostate cancer (s/p prostatectomy) who presented with shortness of breath and back pain, and found to have saddle pulmonary embolism.   Pulmonary embolism Patient found to have bilateral diffuse pulmonary emboli with saddle pulmonary embolus on CT angiography with presence of right heart strain.  S-pesi score 3 (history of cancer, SPO2 less than 90% initially, CHF) indicating high risk with 8.9% mortality.  Bilateral lower extremity ultrasound significant for DVT involving posterior tibial veins bilaterally. Echo significant for EF 55-60%, moderate left ventricular hypertrophy, aortic dilatation with dilatation of aortic root 42 mm. Dilatation of ascending aorta measuring 54m.  CCM and interventional radiology consulted, patient had thrombectomy on 7/6 and was managed in the ICU until 7/8.  Patient was treated with nasal cannula oxygen up to 15, and heparin GTT and then transition to home Eliquis.  Patient was stable able to ambulate on 2 L of oxygen at discharge. Patient to continue Eliquis 10 mg treatment dose through 7/13 and transition to 5 mg maintenance dose on 7/14.    Persistent A-fib Patient managed on Eliquis at home.  Was off Eliquis for 2 weeks due to prostatectomy.  On admission normal sinus rhythm on telemetry metoprolol succinate was continued.  Patient was monitored via telemetry.  Heparin  GTT was transitioned to Eliquis. Heart rate was normal and regular rhythm on discharge.    Fever 7/8 patient had Tmax of 102 F.  Chest x-ray with was unremarkable, CT chest showed likely atelectasis versus pneumonia.  UA significant for large bacteria, UCX had 100,000 gram-negative bacteria.  Could be secondary to inflammatory response from thrombectomy as patient was thereafter afebrile.  Urology recommended Cipro 500 twice daily for 7 days.  Foley will be removed at outpatient follow-up on 7/13.  Patient was afebrile at discharge.  Follow up instructions from patient's hospital healthcare providers:  On CT chest 7/09 dilated ascending thoracic aorta to 4.1 cm. Recommend annual imaging followup by CTA or MRA.  Recommend follow-up chest x-ray in 4-6 weeks to confirm complete resolution of left pleural effusion Follow-up with urology on 7/13 for removal of Foley catheter. Follow-up with PCP for monitor of oxygen use and work of breathing. Follow-up blood pressure with PCP, given transition to home dose of Entresto. ----------------------------------------------------------------------------------------------------------------------  Follow up appointments with specialists:  Pending: Cardiology (7/18) Completed: Urology (7/13) ---------------------------------------------------------------------------------------------------------------------- New medications started during hospitalization: Ciprofloxacin (finishing up course on 7/15), Eliquis was temporarily increased to double dosing-should now be on original dosing. Chronic medications stopped during hospitalization: None Patient's Medication List was updated in the EMR: yes --------------------------------------------------------------------------------------------------------------------- --------------------------------------------------------------------------------------------------------------------- ADLs Independent Needs Assistance  Dependent  Bathing X    Dressing  X   Ambulation X    Toileting X    Eating X     IADL Independent Needs Assistance Dependent  Cooking X    Housework X    Manage Medications X    Manage the telephone X    Shopping for food, clothes, Meds, etc X  Use transportation X    Manage Finances X       Oxygen use, recent PE Patient took off his oxygen at 10 AM and has been doing well with that.  He is wanting to make sure it is okay for him to not use the oxygen and went to [facility].  He notes that he is on 2 L when needed, is currently mainly using it at night. - Discussed ER precautions for increasing oxygen requirement - Continue to monitor O2 sats when awake, especially when ambulating.  If <88% then patient needs to wear the oxygen.  Encourage continue use at night until patient feels comfortable during ambulation without oxygen.  Inner thigh numbness Numbness on the right inner thigh, recently had prostatectomy.  Is not having any pain in the area this time. - Physical exam with numbness in the distribution of the anterior femoral cutaneous nerve - Likely in the setting of nerve stretching during procedure - Anticipate slow improvement with nerve recovery  Coughing with blood Noted single episode of small amount of blood clot in phlegm yesterday.  Has not had any recurrence, was only a small brown tinge to the mucus. - Continue to monitor for recurrence - Feel it could related to increased Eliquis dosing due to recent PE - Return precautions given  Pleuritic chest pain Intermittent chest pain mainly noted during coughing episodes.  Anticipated given recent pulmonary embolus, anticipate that this will occur for possibly several weeks. - Monitor for any development of further symptoms

## 2022-03-17 NOTE — Progress Notes (Unsigned)
Cardiology Office Note:    Date:  03/21/2022   ID:  Jeffrey Griffin, DOB 07-May-1961, MRN [id number]  PCP:  [name], DO  Cardiologist:  Geraldo Pitter Ashby Moskal  Electrophysiologist:  [name], MD   Referring MD: [name], DO   Chief Complaint  Patient presents with   Atrial Fibrillation         Problem list 1.  Acute pulmonary embolism - Jan. 11, 2021  2.  Atrial fibrillation 3.   Ascending thoracic aortic aneurysm ( 4.3 cm)  4.  Acute systolic congestive heart failure-ejection fraction 20 to 25%.    Previous notes:    Jeffrey Griffin is a 61 y.o. male with a recent hospitalization at [facility] for atrial fibrillation.  He was originally seen down in [facility] but wanted to follow-up in [place].  His symptoms started sometime prior to Christmas.  He presented to the urgent care with 4 days of severe shortness of breath and orthopnea.  He was found to have atrial fibrillation.  CT angiogram of the chest showed acute subsegmental pulmonary embolism to the right upper and right lower lobes.  He also was found to have a 4.3 cm ascending aortic aneurysm and a 4 mm right lung nodule.  No stress test or cath performed.  No angina   Family hx of cardiac disease Mom has IHSS Father had CVAs  Brother had Afib / ablation Sister has HTN   Cannot tell that his HR is very iireg.    Breathing is better.    No leg trauma,  No long trips to explain his pulmonary embolus.    Feeling better.  A bit fatigued Used to drink coffee and has been avoiding it recently   Prior to hospitalization , did not follow any diet .  Takes a salad to work for lunch  . Breakfast is scrambled egg white, with veggies + chicken  Dinner is salad, veggies,   Eats suasage several times a week ,   Works out with a trainer several times a week .   November 10, 2019: Jeffrey Griffin is seen back today for follow-up of his atrial fibrillation, congestive heart failure, and pulmonary embolus,  and 4.3 cm ascending aortic aneurysm.  He had a successful cardioversion on October 20, 2019. Feeling much better after cardioversion Still has some episodes of dyspnea.  Has gained weight this past week .   Has been trying to avoid salty foods.  No CP. No palpitations   December 11, 2019:  Jeffrey Griffin is seen today for a follow of his atrial fibrillation, congestive heart failure, pulmonary embolus and 4.3 cm ascending aortic aneurysm. Had a successful cardioversion in February, 2021. Occasional dyspnea.  Not with exertion .  Has been working out 3 times a week.    April 06, 2020: Jeffrey Griffin is seen today for follow-up of his paroxysmal atrial fibrillation, congestive heart failure, pulmonary loss, and moderate ascending aortic aneurysm.  He had a successful cardioversion in February, 2021. Cannot tell if his HR is regular or not  ( he is back in AF today )  Has changed jobs.  Lost his insurance Now is on new insurance,    Lipids and cmet are reviewed and are improving   Jan. 28, 2022 Jeffrey Griffin is seen today for follow up of his HTN, CHF, PAF, pulmonary embolus, ascending aortic aneurism.  Has some fatigue, HR is slow even when working out  Had Afib ablation Nov. 12, 2021 - is on amio for the next  month. Is back on Toprol 100 mg a day  Is on BIPAP for OSA   CT angio in April 20, 2020 reveals a mild ascending thoracic aortic dilatation at 4.0.  This area previously measured 4.3 cm. He remains on Eliquis for his history of pulmonary emboli.  He remains on Entresto, Toprol-XL for his congestive heart failure.   December 05, 2021 Jeffrey Griffin is seen for follow up of his HTN, CHF, PAF ( had Afib ablation in Nov. 2021) , pulmonary embolus ans asc. Aortic aneurism  Rare stabbing pains ,  off and on all day .   Does not change much  Not worsened with deep breath, Does not sound like angina .  Not worsened with activity  Wakes up with it occasion   BP has been mildly elevated.   Feels sleepy constantly   Wears his CPAP.   His feedback numbers look ok   OK for him to hold his Eliquis for 3 days before and after his prostate bx He is at low risk for his prostate bx on April 20  March 17, 2022: Jeffrey Griffin is seen today for follow-up. He had a prostate biopsy and was off his Eliquis for 14 days.  Unfortunately he had a DVT/pulmonary embolus.  We have given the okay for him to hold his Eliquis for 3 days before and 3 days after his prostate biopsy.  He was treated with high-dose Eliquis for 10 days.  On March 17, 2022 he reduce his Eliquis to 5 mg twice a day.  My recommendation is that he have a Lovenox bridging whenever he has to come off Eliquis for surgical procedure or operation.  He was found to have a large saddle embolus.  He underwent suction thrombectomy and is feeling quite well now  Has had prostate surgery .   All the prostate cancer is gone .  Has lost lots of weight  Breathing is ok    Past Medical History:  Diagnosis Date   Abnormality of thoracic aorta    4.3 CM ECTATIC ASENDING    Cardiopathy    CHF (congestive heart failure) (HCC)    SYSTOLIC   Epistaxis 02/25/7627   Hyperlipidemia    Hypertension    Lung nodule    Obesity    OSA (obstructive sleep apnea)    Persistent atrial fibrillation (HCC)    WITH RVR   Pleural effusion    RIGHT UPPER LOBE AND RIGHT LOWER LOBE     Past Surgical History:  Procedure Laterality Date   APPENDECTOMY     ATRIAL FIBRILLATION ABLATION N/A 07/16/2020   Procedure: ATRIAL FIBRILLATION ABLATION;  Surgeon: [name], MD;  Location: [facility];  Service: Cardiovascular;  Laterality: N/A;   CARDIOVERSION N/A 10/20/2019   Procedure: CARDIOVERSION;  Surgeon: Acie Fredrickson Wonda Cheng, MD;  Location: [place];  Service: Cardiovascular;  Laterality: N/A;   CARDIOVERSION N/A 05/31/2020   Procedure: CARDIOVERSION;  Surgeon: Josue Hector, MD;  Location: [place];  Service: Cardiovascular;  Laterality: N/A;   IR ANGIOGRAM PULMONARY  BILATERAL SELECTIVE  03/09/2022   IR ANGIOGRAM SELECTIVE EACH ADDITIONAL VESSEL  03/09/2022   IR ANGIOGRAM SELECTIVE EACH ADDITIONAL VESSEL  03/09/2022   IR THROMBECT PRIM MECH INIT (INCLU) MOD SED  03/09/2022   IR US GUIDE VASC ACCESS RIGHT  03/09/2022   RADIOLOGY WITH ANESTHESIA N/A 03/09/2022   Procedure: IR WITH ANESTHESIA;  Surgeon: Radiologist, Medication, MD;  Location: [place];  Service: Radiology;  Laterality: N/A;  VASECTOMY      Current Medications: Current Meds  Medication Sig   acetaminophen (TYLENOL) 500 MG tablet Take 500 mg by mouth in the morning, at noon, and at bedtime.   apixaban (ELIQUIS) 5 MG TABS tablet Take 2 tablets (10 mg total) by mouth 2 (two) times daily for 3 days, THEN 1 tablet (5 mg total) 2 (two) times daily.   atorvastatin (LIPITOR) 40 MG tablet TAKE 1 TABLET BY MOUTH EVERY DAY   metoprolol succinate (TOPROL-XL) 50 MG 24 hr tablet TAKE 1 TABLET BY MOUTH DAILY. TAKE WITH OR IMMEDIATELY FOLLOWING A MEAL.   sacubitril-valsartan (ENTRESTO) 97-103 MG Take 1 tablet by mouth 2 (two) times daily.     Allergies:   Patient has no known allergies.   Social History   Socioeconomic History   Marital status: Married    Spouse name: Not on file   Number of children: Not on file   Years of education: Not on file   Highest education level: Not on file  Occupational History   Not on file  Tobacco Use   Smoking status: Never   Smokeless tobacco: Never  Substance and Sexual Activity   Alcohol use: Not Currently   Drug use: No   Sexual activity: Not on file  Other Topics Concern   Not on file  Social History Narrative   Lives in [place].   Social Determinants of Health   Financial Resource Strain: Not on file  Food Insecurity: Not on file  Transportation Needs: Not on file  Physical Activity: Not on file  Stress: Not on file  Social Connections: Not on file     Family History: The patient's family history includes Atrial fibrillation in his brother;  Hypertension in his father and sister; Hypertrophic cardiomyopathy in his mother; Stroke in his father.  ROS:     EKGs/Labs/Other Studies Reviewed:     EKG:    December 06, 2018:  NSR at 68.  No ST or T wave changes.   Recent Labs: 03/08/2022: ALT 30 03/12/2022: BUN 16; Creatinine, Ser 0.79; Potassium 4.0; Sodium 134 03/14/2022: Hemoglobin 11.0; Platelets 474  Recent Lipid Panel    Component Value Date/Time   CHOL 150 03/30/2020 0742   TRIG 168 (H) 03/30/2020 0742   HDL 34 (L) 03/30/2020 0742   CHOLHDL 4.4 03/30/2020 0742   CHOLHDL 5.3 (H) 08/14/2015 1015   VLDL 28 08/14/2015 1015   LDLCALC 87 03/30/2020 0742    Physical Exam:    Physical Exam: Blood pressure 138/80, pulse 71, height '5\' 9"'$  (1.753 m), weight 217 lb (98.4 kg), SpO2 95 %.  GEN:  Well nourished, well developed in no acute distress HEENT: Normal NECK: No JVD; No carotid bruits LYMPHATICS: No lymphadenopathy CARDIAC: RRR , no murmurs, rubs, gallops RESPIRATORY:  Clear to auscultation without rales, wheezing or rhonchi  ABDOMEN: Soft, non-tender, non-distended MUSCULOSKELETAL:  No edema; No deformity  SKIN: Warm and dry NEUROLOGIC:  Alert and oriented x 3    ECG :   ASSESSMENT:    1. Acute saddle pulmonary embolism with acute cor pulmonale (HCC)   2. PAF (paroxysmal atrial fibrillation) ([place])   3. Congestive heart failure, unspecified HF chronicity, unspecified heart failure type ([place])      PLAN:      1.,.  Atrial fibrillation:   remains in NSR   2.  Congestive heart failure:   Continue Eliquis.  His echocardiogram from March 10, 2022 reveals LVEF of 55 to 60%.  He has moderate left ventricular hypertrophy.  He has normal diastolic pressure.     3.  Pulmonary embolus:  .  Had a recurrent PE while off the Eliquis I recommend Lovenox bridging when he needs to stop eliquis in the future    4.  Hypertension:    Blood pressure is well controlled.    4.  Hyperlipidemia:   Stable     5.  Ascending  aortic aneurism.    We will have him follow-up with an APP in 6 months.  I will plan on seeing him in 1 year.  Medication Adjustments/Labs and Tests Ordered: Current medicines are reviewed at length with the patient today.  Concerns regarding medicines are outlined above.  No orders of the defined types were placed in this encounter.  No orders of the defined types were placed in this encounter.   Patient Instructions  Medication Instructions:  Your physician recommends that you continue on your current medications as directed. Please refer to the Current Medication list given to you today.  *If you need a refill on your cardiac medications before your next appointment, please call your pharmacy*   Lab Work: NONE If you have labs (blood work) drawn today and your tests are completely normal, you will receive your results only by: [place] (if you have MyChart) OR A paper copy in the mail If you have any lab test that is abnormal or we need to change your treatment, we will call you to review the results.   Testing/Procedures: NONE   Follow-Up: At [facility], you and your health needs are our priority.  As part of our continuing mission to provide you with exceptional heart care, we have created designated Provider Care Teams.  These Care Teams include your primary Cardiologist (physician) and Advanced Practice Providers (APPs -  Physician Assistants and Nurse Practitioners) who all work together to provide you with the care you need, when you need it.  Your next appointment:   1 year(s)  The format for your next appointment:   In Person  Provider:   Ronn Melena, or Chiante Peden {     Important Information About Sugar         Signed, [name], MD  03/21/2022 11:23 AM    [place]

## 2022-03-20 ENCOUNTER — Encounter: Payer: Self-pay | Admitting: Family Medicine

## 2022-03-21 ENCOUNTER — Encounter: Payer: Self-pay | Admitting: Cardiovascular Disease

## 2022-03-21 ENCOUNTER — Ambulatory Visit: Payer: No Typology Code available for payment source | Admitting: Cardiovascular Disease

## 2022-03-21 VITALS — BP 138/80 | HR 71 | Ht 69.0 in | Wt 217.0 lb

## 2022-03-21 DIAGNOSIS — I509 Heart failure, unspecified: Secondary | ICD-10-CM | POA: Diagnosis not present

## 2022-03-21 DIAGNOSIS — I480 Paroxysmal atrial fibrillation: Secondary | ICD-10-CM | POA: Diagnosis not present

## 2022-03-21 DIAGNOSIS — I2602 Saddle embolus of pulmonary artery with acute cor pulmonale: Secondary | ICD-10-CM

## 2022-03-21 NOTE — Patient Instructions (Signed)
Medication Instructions:  Your physician recommends that you continue on your current medications as directed. Please refer to the Current Medication list given to you today.  *If you need a refill on your cardiac medications before your next appointment, please call your pharmacy*   Lab Work: NONE If you have labs (blood work) drawn today and your tests are completely normal, you will receive your results only by: MyChart Message (if you have MyChart) OR A paper copy in the mail If you have any lab test that is abnormal or we need to change your treatment, we will call you to review the results.   Testing/Procedures: NONE   Follow-Up: At [facility], you and your health needs are our priority.  As part of our continuing mission to provide you with exceptional heart care, we have created designated Provider Care Teams.  These Care Teams include your primary Cardiologist (physician) and Advanced Practice Providers (APPs -  Physician Assistants and Nurse Practitioners) who all work together to provide you with the care you need, when you need it.  Your next appointment:   1 year(s)  The format for your next appointment:   In Person  Provider:   [place], [place], or [place] {     Important Information About Sugar       

## 2022-03-22 IMAGING — CT CT HEART MORPH/PULM VEIN W/ CM & W/O CA SCORE
2 of 7 series · 11 of 20 positions shown, 13 images · IV contrast (Omni 300)
Comparison: None.
COMPARISON: None.

Addendum:
EXAM:
OVER-READ INTERPRETATION  CT CHEST

The following report is an over-read performed by radiologist Dr.
Widjai Straten [REDACTED] on 07/09/2020. This
over-read does not include interpretation of cardiac or coronary
anatomy or pathology. The coronary calcium score/coronary CTA
interpretation by the cardiologist is attached.
CLINICAL DATA: Chest pain
Cardiac CTA
MEDICATIONS:
Sub lingual nitro. 4mg x 2
TECHNIQUE: The patient was scanned on a Siemens [REDACTED]ice scanner. Gantry
rotation speed was 250 msecs. Collimation was 0.6 mm. A 100 kV
prospective scan was triggered in the ascending thoracic aorta at
35-75% of the R-R interval. Average HR during the scan was bpm. The
3D data set was interpreted on a dedicated work station using MPR,
MIP and VRT modes. A total of 80cc of contrast was used.

[Series 11: 0-95% · axial · 0.44mm/px · z∈[+1123,+1197]mm · 5 of 2220 slices shown]
[im 370/2220  vessel]
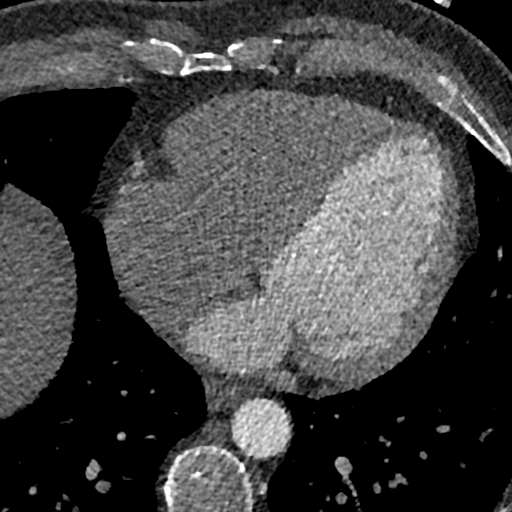
[im 740/2220  vessel]
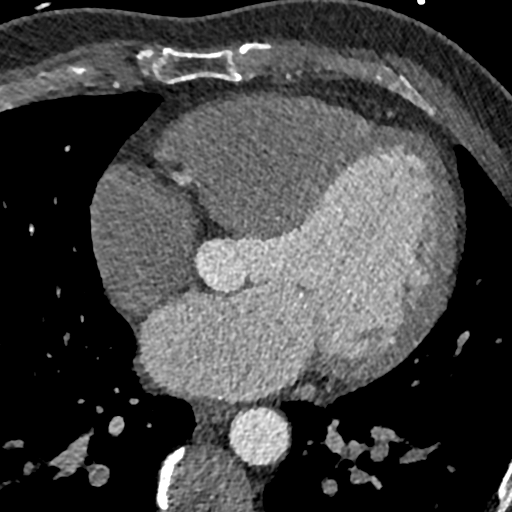
[im 1110/2220  vessel]
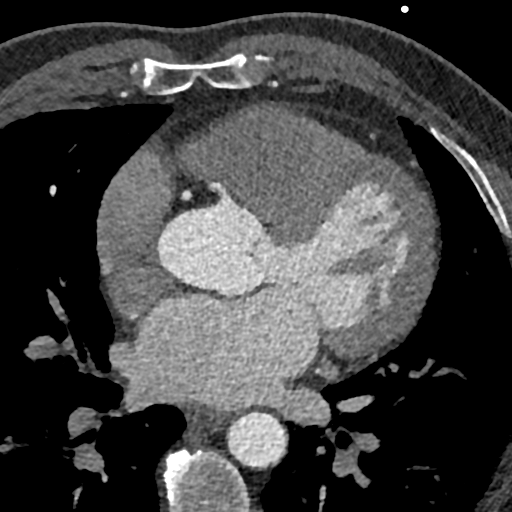
[im 1480/2220  vessel]
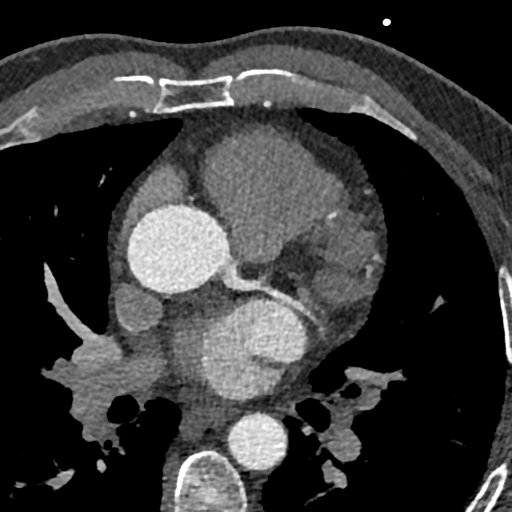
[im 1850/2220  vessel]
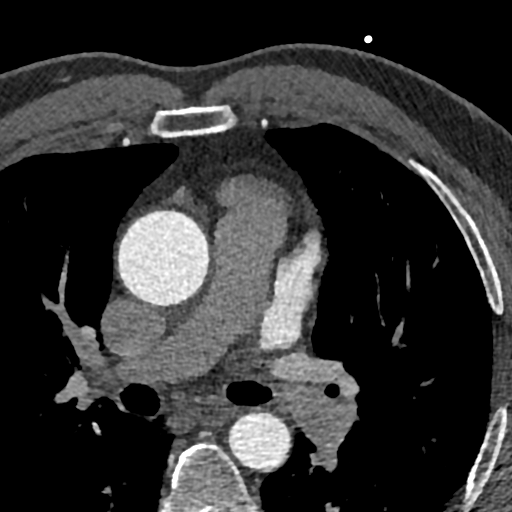

[Series 14: 5-95% · axial · 0.44mm/px · z∈[+1120,+1200]mm · 6 of 2220 slices shown, 8 images]
[im 318/2220  vessel]
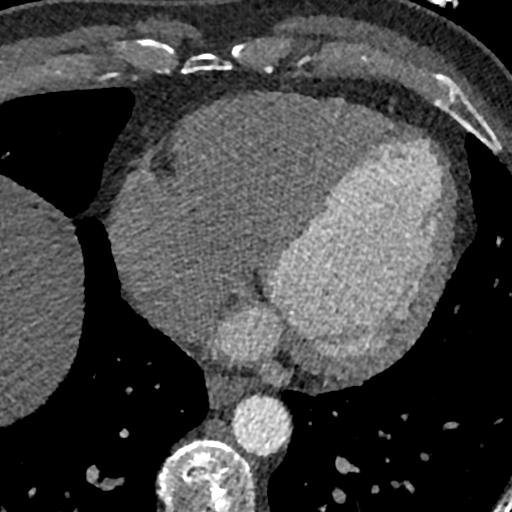
[im 318/2220  lung]
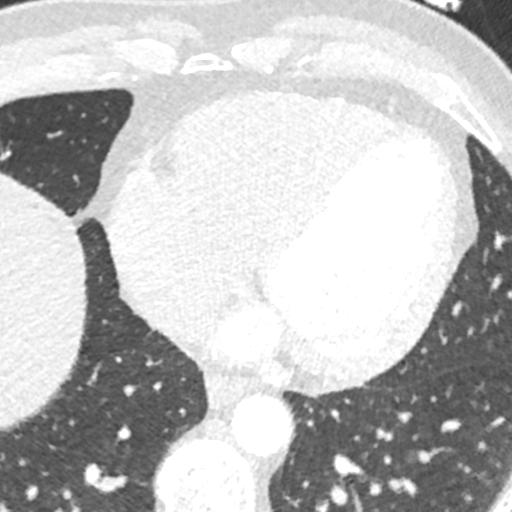
[im 635/2220  vessel]
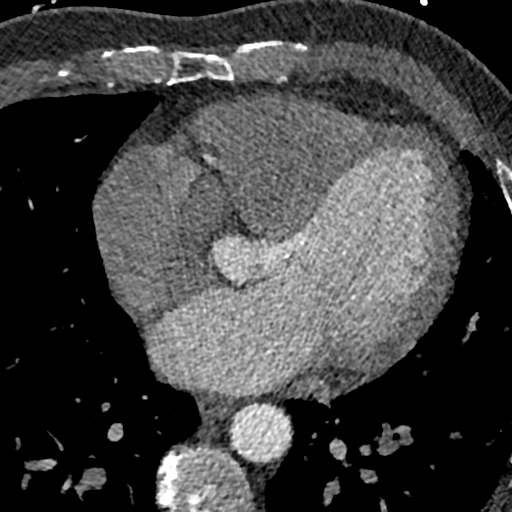
[im 952/2220  vessel]
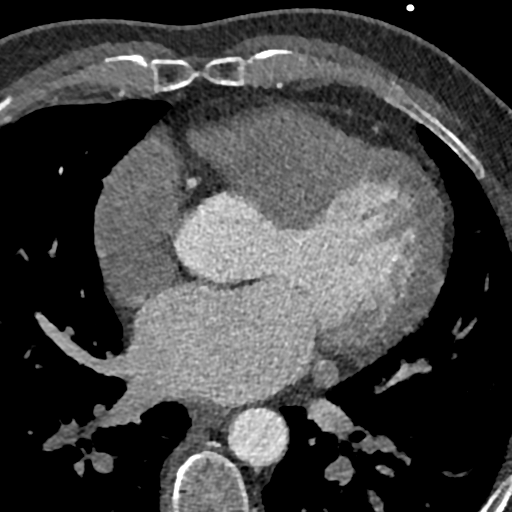
[im 1269/2220  vessel]
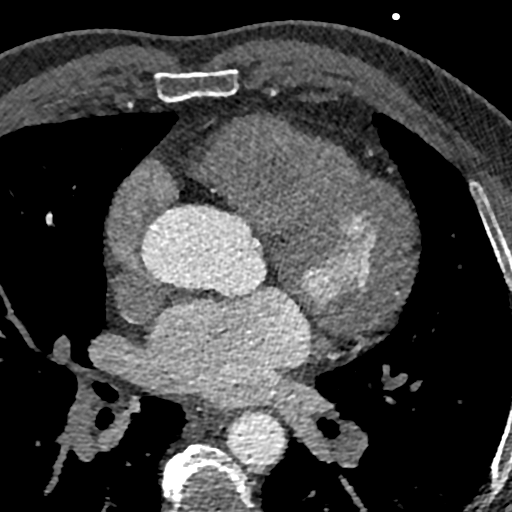
[im 1586/2220  vessel]
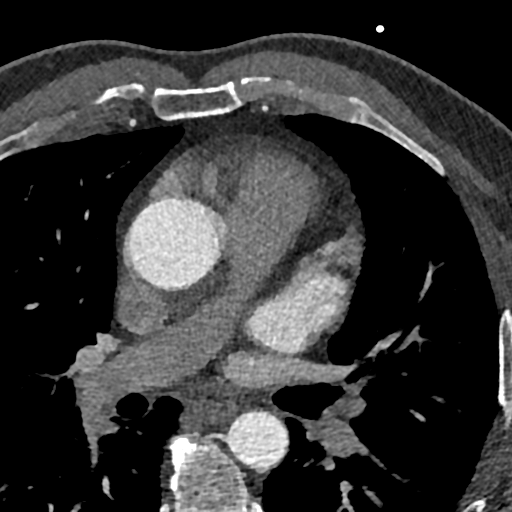
[im 1586/2220  lung]
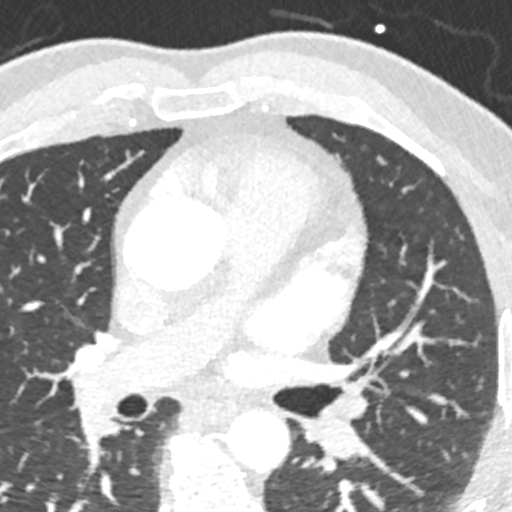
[im 1903/2220  vessel]
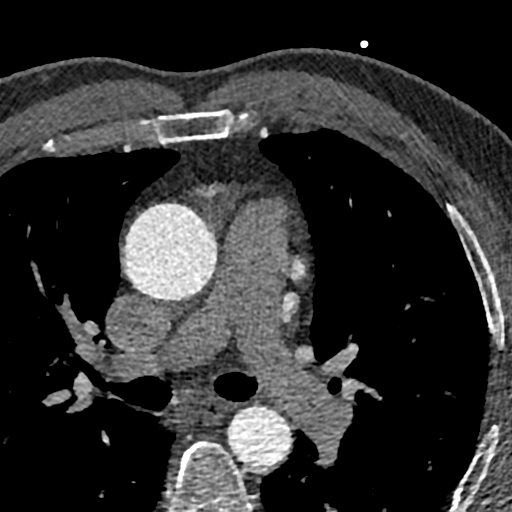

[11 of 20 positions shown; findings below may reference images not displayed]

FINDINGS: Ectasia of ascending thoracic aorta (4.3 cm in diameter). Within the
visualized portions of the thorax there are no suspicious appearing
pulmonary nodules or masses, there is no acute consolidative
airspace disease, no pleural effusions, no pneumothorax and no
lymphadenopathy. Visualized portions of the upper abdomen are
unremarkable. There are no aggressive appearing lytic or blastic
lesions noted in the visualized portions of the skeleton.
IMPRESSION: 1. Ectasia of ascending thoracic aorta (4.3 cm in diameter).
Recommend annual imaging followup by CTA or MRA. This recommendation
follows 4828 ACCF/AHA/AATS/ACR/ASA/SCA/AUJLA/COLLEY/KASSIMI/NUNZIANTE Guidelines
for the Diagnosis and Management of Patients with Thoracic Aortic
Disease. Circulation. 4828; 121: E266-e369. Aortic aneurysm NOS
(QQB1K-VH2.E)
FINDINGS: Non-cardiac: See separate report from [REDACTED].

Pulmonary veins drain normally to the left atrium. Difficult images
of the LA appendage (artifact), but I do not think a thrombus is
present. This is confirmed on delayed images.

Pulmonary veins:

LSPV 20 x 14 mm

LIPV 20 x 16 mm, close proximity to descending thoracic aorta.

RSPV 20 x 15 mm

RMPV 10 x 9 mm

RIPV 12 x 11 mm

Calcium Score: 0 Agatston units.

Coronary Arteries: Right dominant with no anomalies

LM: No plaque or stenosis.

LAD system: No significant disease proximal to mid LAD, artifact
makes more distal vessel difficult to assess.

Circumflex system: Large ramus. Proximal ramus and proximal LCx with
no significant disease. Artifact makes more distal vessels difficult
to assess.

RCA system: Proximal and mid RCA without significant disease, distal
RCA/PDA not imaged.
IMPRESSION: 1.  Pulmonary veins as described above.

2. Somewhat difficult images but I do not think that a LA appendage
thrombus is present.

3. Coronary artery calcium score 0 Agatston units, suggesting low
risk for future cardiac events.

4. Coronaries not well-visualized, no definite significant coronary
disease noted.

[place]ouar Antra

*** End of Addendum ***
EXAM:
OVER-READ INTERPRETATION  CT CHEST

The following report is an over-read performed by radiologist Dr.
Widjai Straten [REDACTED] on 07/09/2020. This
over-read does not include interpretation of cardiac or coronary
anatomy or pathology. The coronary calcium score/coronary CTA
interpretation by the cardiologist is attached.
FINDINGS: Ectasia of ascending thoracic aorta (4.3 cm in diameter). Within the
visualized portions of the thorax there are no suspicious appearing
pulmonary nodules or masses, there is no acute consolidative
airspace disease, no pleural effusions, no pneumothorax and no
lymphadenopathy. Visualized portions of the upper abdomen are
unremarkable. There are no aggressive appearing lytic or blastic
lesions noted in the visualized portions of the skeleton.
IMPRESSION: 1. Ectasia of ascending thoracic aorta (4.3 cm in diameter).
Recommend annual imaging followup by CTA or MRA. This recommendation
follows 4828 ACCF/AHA/AATS/ACR/ASA/SCA/AUJLA/COLLEY/KASSIMI/NUNZIANTE Guidelines
for the Diagnosis and Management of Patients with Thoracic Aortic
Disease. Circulation. 4828; 121: E266-e369. Aortic aneurysm NOS
(QQB1K-VH2.E)

## 2022-03-24 ENCOUNTER — Encounter: Payer: Self-pay | Admitting: Family Medicine

## 2022-03-28 ENCOUNTER — Ambulatory Visit: Payer: No Typology Code available for payment source | Admitting: Sports Medicine

## 2022-04-04 ENCOUNTER — Other Ambulatory Visit: Payer: No Typology Code available for payment source

## 2022-04-11 DIAGNOSIS — R350 Frequency of micturition: Secondary | ICD-10-CM

## 2022-04-11 HISTORY — DX: Frequency of micturition: R35.0

## 2022-04-17 ENCOUNTER — Telehealth (HOSPITAL_COMMUNITY): Payer: Self-pay

## 2022-04-17 ENCOUNTER — Other Ambulatory Visit (HOSPITAL_COMMUNITY): Payer: Self-pay

## 2022-04-17 ENCOUNTER — Ambulatory Visit (INDEPENDENT_AMBULATORY_CARE_PROVIDER_SITE_OTHER): Payer: No Typology Code available for payment source | Admitting: Family Medicine

## 2022-04-17 ENCOUNTER — Encounter: Payer: Self-pay | Admitting: Family Medicine

## 2022-04-17 VITALS — BP 142/91 | HR 66 | Ht 69.0 in | Wt 234.4 lb

## 2022-04-17 DIAGNOSIS — I2602 Saddle embolus of pulmonary artery with acute cor pulmonale: Secondary | ICD-10-CM

## 2022-04-17 DIAGNOSIS — H9313 Tinnitus, bilateral: Secondary | ICD-10-CM | POA: Diagnosis not present

## 2022-04-17 DIAGNOSIS — Z1211 Encounter for screening for malignant neoplasm of colon: Secondary | ICD-10-CM | POA: Diagnosis not present

## 2022-04-17 DIAGNOSIS — H9193 Unspecified hearing loss, bilateral: Secondary | ICD-10-CM

## 2022-04-17 NOTE — Patient Instructions (Signed)
I am glad you are doing somewhat better with your symptoms.  I think for several of your problems, we are going to have to wait and see what happens with time and see if they do start to improve.    I put in a referral to the GI for colonoscopy and discussed that pain you are having in your rectal region.  Regarding the pain in your genitals, there is not much I am able to do in the medications that I know would not necessarily treat this.  I do need you to talk to your urologist at next appointment about this in case there is something structural that is different.

## 2022-04-17 NOTE — Telephone Encounter (Signed)
Pharmacy Transitions of Care Follow-up Telephone Call  Date of discharge: 03/14/2022  Discharge Diagnosis: PE  How have you been since you were released from the hospital? Patient has been doing well since discharge. He has no questions or concerns regarding his medications.    Medication changes made at discharge: START taking: polyethylene glycol powder (GLYCOLAX/MIRALAX)  saline   CHANGE how you take: Eliquis (apixaban)  STOP taking: ibuprofen 200 MG tablet (ADVIL)  SAW PALMETTO COMPLEX PO    ASK how to take: ciprofloxacin 500 MG tablet (CIPRO)   Medication changes verified by the patient? Yes    Medication Accessibility:  [facility]   Was the patient provided with refills on discharged medications? Yes   Have all prescriptions been transferred from [facility] to home pharmacy? Patient has an existing prescription from [facility] that he gets refilled for apixaban.    Is the patient able to afford medications? Has insurance, Workers IT trainer Notable copays: $0 for 30ds as of now, but patient has $10 30ds co-pay coupon    Medication Review:   APIXABAN (ELIQUIS)  Apixaban 5 mg BID - Discussed importance of taking medication around the same time everyday  - Advised patient of medications to avoid (NSAIDs, ASA)  - Educated that Tylenol (acetaminophen) will be the preferred analgesic to prevent risk of bleeding  - Emphasized importance of monitoring for signs and symptoms of bleeding (abnormal bruising, prolonged bleeding, nose bleeds, bleeding from gums, discolored urine, black tarry stools)  - Advised patient to alert all providers of anticoagulation therapy prior to starting a new medication or having a procedure   Follow-up Appointments:  PCP Hospital f/u appt confirmed? Saw Alana Lilland, DO on 03/17/2022.   [facility] f/u appt confirmed? Saw [name], MD on 03/21/2022.   If their condition worsens, is the pt aware to call PCP or go to the Emergency  Dept.? Yes  Final Patient Assessment: Patient has had follow-up and has refills sent to home pharmacy.

## 2022-04-17 NOTE — Assessment & Plan Note (Signed)
Patient no longer requiring oxygen.  Is stable on Eliquis.  No concerns at this time, is no longer having chest pains. - Continue Eliquis

## 2022-04-17 NOTE — Progress Notes (Signed)
    SUBJECTIVE:   CHIEF COMPLAINT / HPI:   Pleuritic chest pain episodes  recent PE - Have resolved - Last episode 1 week prior - Walking 1/2 mile as able - Has some muscle weakness and deconditioning  Numbness on inner thighs  recent prostate surgery - still present on b/l inner thighs - is working with PT and urology office - Has improvement in incontinence (related to surgery)  Pain/discomfort in anal region - No changes since surgery  - No pain with defecation or hematochezia/melena  - Feels like it is an internal discomfort when sitting for long periods or moving while sitting  Pain in genital region - Feels soreness in his penis "down the center" - No dysuria, had catheter for 2 weeks - Catheter removed about 1 month ago - Intermittently will have some hematuria if exerting himself  Tinnitus, chronic with hearing changes - Patient has worked as Dealer for many decades - Doesn't use much hearing protection - Has ringing in his ears that is been present more frequently lately  - Feels that his hearing has decreased, though he still able to hear most things  PERTINENT  PMH / PSH: Reviewed  OBJECTIVE:   BP (!) 142/91   Pulse 66   Ht '5\' 9"'$  (1.753 m)   Wt 234 lb 6 oz (106.3 kg)   SpO2 96%   BMI 34.61 kg/m   Gen: well-appearing, NAD CV: RRR, no m/r/g appreciated, no peripheral edema Pulm: CTAB, no wheezes/crackles  ASSESSMENT/PLAN:   Tinnitus Chronic tinnitus secondary to loud work environment, patient reporting some hearing difficulties.  We will have patient evaluated by audiology. - Referral to audiology per patient preference  Genital region pain Unclear what exactly is happening with patient's pain, could be related to catheter previously or stricture.  No current urinary symptoms, which is reassuring. - Encourage patient to follow-up with urology  Rectal pain Reassuringly not associated with bowel movements and no hematochezia or melena.  Likely  related to prosthetic surgery, may be muscle spasms present.  Unclear exact etiology, but do feel GI and colonoscopy are warranted and patient was requesting this as well. - Referral to GI - Colonoscopy referral placed  Pulmonary embolus Chest pains have improved from the pulmonary embolus, patient is stable on Eliquis with no concerns this time. - Continue Eliquis  Numbness on inner thighs Likely secondary to nerve stretching during surgery. Anticipate improvement but will closely monitor.   [name], [place]

## 2022-04-18 ENCOUNTER — Ambulatory Visit
Admission: RE | Admit: 2022-04-18 | Discharge: 2022-04-18 | Disposition: A | Discharge status: Home / Self Care | Payer: No Typology Code available for payment source | Source: Ambulatory Visit | Attending: Sports Medicine | Admitting: Sports Medicine

## 2022-04-18 ENCOUNTER — Ambulatory Visit (INDEPENDENT_AMBULATORY_CARE_PROVIDER_SITE_OTHER): Payer: No Typology Code available for payment source | Admitting: Sports Medicine

## 2022-04-18 VITALS — BP 142/92 | Ht 69.0 in | Wt 234.6 lb

## 2022-04-18 DIAGNOSIS — M25512 Pain in left shoulder: Secondary | ICD-10-CM | POA: Diagnosis not present

## 2022-04-18 DIAGNOSIS — M25521 Pain in right elbow: Secondary | ICD-10-CM

## 2022-04-18 NOTE — Progress Notes (Signed)
Subjective:    Patient ID: Jeffrey Griffin, male    DOB: 1961-02-13, 61 y.o.   MRN: [id number]  HPI chief complaint: Left shoulder and right elbow pain  Jeffrey Griffin is a very pleasant 61 year old right-hand-dominant male that comes in today with a couple of different complaints.  Main complaint is left shoulder pain this been present for about 3 years.  He was last seen in our office about a year ago.  An ultrasound evaluation of the shoulder at that time showed findings consistent with a probable partial rotator cuff tear.  He also had some findings of AC DJD.  An injection into his [facility] joint was administered which did help some at the time.  Today's pain is more in the lateral shoulder and is present with overhead activity.  Also pain at night.  No prior surgeries on the left shoulder but he is status post remote arthroscopy on the right shoulder for what sounds like a debridement in 2007 while living in [place].  He will occasionally take Tylenol which is helpful. He is also complaining of approximately 1 year of diffuse right elbow pain.  Is most noticeable when working out in the gym, especially with full extension.  He denies any swelling.  No trauma to the elbow.  Past medical history is reviewed.  It is significant for a recent saddle embolus.  He is on Eliquis indefinitely for reoccurring blood clots.  Is also status post recent prostatectomy.    Review of Systems As above    Objective:   Physical Exam  Well-developed, well-nourished.  No acute distress  Left shoulder: There is some limited active and passive external rotation on the left compared to the right.  This does reproduce some pain.  Positive empty can and positive painful arc.  No tenderness to palpation directly over the [facility] joint nor over the bicipital groove.  He has 4+/5 strength with resisted supraspinatus on the left.  5/5 strength with resisted external rotation and internal rotation.  No atrophy.  Good pulses  distally.  Right elbow: Full range of motion.  No effusion.  No tenderness to palpation over the medial or lateral epicondyles.  He is tender to palpation at the biceps insertion onto the radial tuberosity.  Also has reproducible pain with resisted forearm supination.  Good pulses distally.      Assessment & Plan:   Chronic left shoulder pain-rule out rotator cuff tear Right elbow pain secondary to biceps tendinopathy  Patient's left shoulder has been an issue for about 3 years.  His weakness on exam and nighttime pain are concerning for rotator cuff tear.  Partial tear was seen on his ultrasound a year ago.  I recommended that we proceed with x-rays and an MRI to evaluate further.  Phone follow-up with those results when available.  We will delineate further work-up and treatment based on those findings.  In the meantime, he will start physical therapy for his right elbow biceps tendinopathy.  I also discussed with him the importance of modifying his exercises with his personal trainer for both his right elbow and his left shoulder.  This note was dictated using Dragon naturally speaking software and may contain errors in syntax, spelling, or content which have not been identified prior to signing this note.   Addendum: X-rays reviewed.  X-rays of both left shoulder and right elbow are unremarkable.  Proceed with work-up and treatment as above including MRI of the left shoulder to rule out rotator cuff tear.

## 2022-04-19 ENCOUNTER — Encounter: Payer: Self-pay | Admitting: *Deleted

## 2022-04-21 ENCOUNTER — Ambulatory Visit (INDEPENDENT_AMBULATORY_CARE_PROVIDER_SITE_OTHER): Payer: No Typology Code available for payment source

## 2022-04-21 ENCOUNTER — Ambulatory Visit: Payer: No Typology Code available for payment source | Attending: Sports Medicine

## 2022-04-21 ENCOUNTER — Other Ambulatory Visit: Payer: Self-pay

## 2022-04-21 ENCOUNTER — Ambulatory Visit (INDEPENDENT_AMBULATORY_CARE_PROVIDER_SITE_OTHER): Payer: No Typology Code available for payment source | Admitting: Podiatry

## 2022-04-21 ENCOUNTER — Encounter: Payer: Self-pay | Admitting: Podiatry

## 2022-04-21 DIAGNOSIS — H9313 Tinnitus, bilateral: Secondary | ICD-10-CM | POA: Diagnosis present

## 2022-04-21 DIAGNOSIS — L600 Ingrowing nail: Secondary | ICD-10-CM

## 2022-04-21 DIAGNOSIS — H903 Sensorineural hearing loss, bilateral: Secondary | ICD-10-CM | POA: Insufficient documentation

## 2022-04-21 DIAGNOSIS — M722 Plantar fascial fibromatosis: Secondary | ICD-10-CM

## 2022-04-21 DIAGNOSIS — M778 Other enthesopathies, not elsewhere classified: Secondary | ICD-10-CM | POA: Diagnosis not present

## 2022-04-21 DIAGNOSIS — M25521 Pain in right elbow: Secondary | ICD-10-CM | POA: Diagnosis present

## 2022-04-21 NOTE — Progress Notes (Signed)
Subjective:   Patient ID: Jeffrey Griffin Reason, male   DOB: 61 y.o.   MRN: [id number]   HPI Patient presents stating he has a lump on his left arch that is been there for around a year and he also has some nail disease of the right big toenail which can become painful when he tries to work on himself.  Patient states the lump is nonpainful currently he does not smoke he has just had a prostatectomy and did have a PE postsurgically and is on a blood thinner   Review of Systems  All other systems reviewed and are negative.       Objective:  Physical Exam Vitals and nursing note reviewed.  Constitutional:      Appearance: He is well-developed.  Pulmonary:     Effort: Pulmonary effort is normal.  Musculoskeletal:        General: Normal range of motion.  Skin:    General: Skin is warm.  Neurological:     Mental Status: He is alert.     Neurovascular status found to be intact muscle strength found to be adequate range of motion within normal limits.  Patient found to have a damaged right hallux nail that is thick on the lateral side and into the center portion has a small nodule plantar aspect left arch in the mid arch area measuring about 7 x 7 mm.  Patient is not painful within that and there is no color discoloration     Assessment:  Date managed right hallux nail with pain along with plantar fibroma nodule left     Plan:  H&P reviewed both conditions and x-ray left and do not recommend treatment currently was to grow in size became painful change color and for the right I recommended removal of the nailbed patient wants this done I infiltrated 60 mg like Marcaine mixture I had patient read and signed consent form understanding risk using sterile instrumentation I remove the right hallux nail exposed matrix applied phenol 5 applications 30 seconds followed by alcohol lavage sterile dressing gave instructions on soaks leave dressing on 24 hours take it off earlier if throbbing were to occur  and encouraged him to call all questions concerns which may arise

## 2022-04-21 NOTE — Patient Instructions (Signed)

## 2022-04-21 NOTE — Therapy (Signed)
OUTPATIENT PHYSICAL THERAPY SHOULDER EVALUATION   Patient Name: Jeffrey Griffin MRN: [id number] DOB:Aug 05, 1961, 61 y.o., male Today's Date: 04/21/2022   PT End of Session - 04/21/22 1513     Visit Number 1    Number of Visits 9    Date for PT Re-Evaluation 05/26/22    Authorization Type [place] PREFERRED    PT Start Time 0933    PT Stop Time 7943    PT Time Calculation (min) 45 min    Activity Tolerance Patient tolerated treatment well    Behavior During Therapy WFL for tasks assessed/performed             Past Medical History:  Diagnosis Date   Abnormality of thoracic aorta    4.3 CM ECTATIC ASENDING    Cardiopathy    CHF (congestive heart failure) ([place])    SYSTOLIC   Epistaxis 2/76/1470   Hyperlipidemia    Hypertension    Lung nodule    Obesity    OSA (obstructive sleep apnea)    Persistent atrial fibrillation (HCC)    WITH RVR   Pleural effusion    RIGHT UPPER LOBE AND RIGHT LOWER LOBE    Past Surgical History:  Procedure Laterality Date   APPENDECTOMY     ATRIAL FIBRILLATION ABLATION N/A 07/16/2020   Procedure: ATRIAL FIBRILLATION ABLATION;  Surgeon: [name], MD;  Location: [facility];  Service: Cardiovascular;  Laterality: N/A;   CARDIOVERSION N/A 10/20/2019   Procedure: CARDIOVERSION;  Surgeon: Acie Fredrickson Wonda Cheng, MD;  Location: [place];  Service: Cardiovascular;  Laterality: N/A;   CARDIOVERSION N/A 05/31/2020   Procedure: CARDIOVERSION;  Surgeon: Josue Hector, MD;  Location: [place];  Service: Cardiovascular;  Laterality: N/A;   IR ANGIOGRAM PULMONARY BILATERAL SELECTIVE  03/09/2022   IR ANGIOGRAM SELECTIVE EACH ADDITIONAL VESSEL  03/09/2022   IR ANGIOGRAM SELECTIVE EACH ADDITIONAL VESSEL  03/09/2022   IR THROMBECT PRIM MECH INIT (INCLU) MOD SED  03/09/2022   IR US GUIDE VASC ACCESS RIGHT  03/09/2022   RADIOLOGY WITH ANESTHESIA N/A 03/09/2022   Procedure: IR WITH ANESTHESIA;  Surgeon: Radiologist, Medication, MD;  Location: [facility];  Service:  Radiology;  Laterality: N/A;   VASECTOMY     Patient Active Problem List   Diagnosis Date Noted   Knee pain, bilateral 03/12/2022   Pulmonary embolism ([place]) 03/09/2022   OSA (obstructive sleep apnea) 03/09/2022   PE (pulmonary thromboembolism) ([place]) 03/09/2022   S/P prostatectomy    Paroxysmal atrial fibrillation (HCC)    Fatigue 03/04/2021   Subcutaneous nodule of left foot 03/04/2021   Atypical atrial flutter ([place]) 05/26/2020   Secondary hypercoagulable state ([place]) 04/13/2020   Chronic combined systolic and diastolic CHF (congestive heart failure) ([place]) 04/06/2020   Persistent atrial fibrillation ([place]) 10/01/2019   Pulmonary embolus ([place]) 10/01/2019    PCP: Biceps tendonitis right  REFERRING PROVIDER: Thurman Coyer, DO  REFERRING DIAG: [id number] (ICD-10-CM) - Right elbow pain; Biceps tendonitis right  THERAPY DIAG:  Pain in right elbow  Rationale for Evaluation and Treatment Rehabilitation  ONSET DATE: A couple of years  SUBJECTIVE:  SUBJECTIVE STATEMENT: Reports a chronic Hx of R elbow pain for a couple of years. Pt works as an Cabin crew and reports it seems to be a issue of wear and tear vs a specific incident. He has not work due to [employer] cancer treatment since February 23, 2022, and the pain has not improved. He has worked with a Insurance underwriter,  but has not work with him for the sam time frame. Pt is looking to return to work. He notes he normally works through the pain the majority of the time, but f the pain is very high, he will rest and use a cold pack for 15 mins and then continue with work.  PERTINENT HISTORY: CHF, Afib  PAIN:  Are you having pain? Yes: NPRS scale: 1/10 Pain location: R elbow Pain description: ache and sharp Aggravating factors: Elbow extension and flexion  end range, certain r arm movements Relieving factors: rest and support, ice pack Pain rangeon eval: 0-9/10  PRECAUTIONS: None  WEIGHT BEARING RESTRICTIONS No  FALLS:  Has patient fallen in last 6 months? No  LIVING ENVIRONMENT: Lives with: lives with their family Lives in: House/apartment Pt reports no issue with accessing or mobility within home   OCCUPATION: Cabin crew  PLOF: Independent  PATIENT GOALS To have less pain and tolerate work related activities better  OBJECTIVE:   DIAGNOSTIC FINDINGS:  R elbow Xray 04/18/22 IMPRESSION: Negative.  PATIENT SURVEYS:  FOTO Perceived function 62%, predicted 73%  COGNITION:  Overall cognitive status: Within functional limits for tasks assessed     SENSATION: WFL  POSTURE: Forward head and rounded shoulders  UPPER EXTREMITY ROM:    WNLs Active ROM Right eval Left eval  Shoulder flexion    Shoulder extension    Shoulder abduction    Shoulder adduction    Shoulder internal rotation    Shoulder external rotation Pain at end range   Elbow flexion Pain at end range   Elbow extension    Wrist flexion    Wrist extension    Wrist ulnar deviation    Wrist radial deviation    Wrist pronation    Wrist supination Pain at end range   (Blank rows = not tested)  UPPER EXTREMITY MMT:  MMT Right eval Left eval  Shoulder flexion 5   Shoulder extension 5   Shoulder abduction 5   Shoulder adduction 5   Shoulder internal rotation 5, pain   Shoulder external rotation 5   Middle trapezius    Lower trapezius    Elbow flexion 5, pain   Elbow extension 5   Wrist flexion 5, pain   Wrist extension 5   Wrist ulnar deviation 5   Wrist radial deviation 5   Wrist pronation 5   Wrist supination 5   Grip strength (lbs) TBA TBA  (Blank rows = not tested)  SHOULDER SPECIAL TESTS:  Biceps assessment: Yergason's test: negative and Speed's test: negative  JOINT MOBILITY TESTING:  Medial and lateral elbow ligament stress  testing negative  PALPATION:  TTP to the distal aspect and insertion of the bicep tendon   TODAY'S TREATMENT:  [facility] Adult PT Treatment:                                                DATE: 04/21/22 Modalities: Iontophoresis to the ant aspect of the R elbow over  the insertion of the distal bicep tendon '4mg'$ /ml, 1 ml Instructed pt in 6 hour wear time and possible skin reaction to the adhesive of the patch Self Care: CFM to the distal bicep tendon 3 to 4x daily for 3-5 mins  PATIENT EDUCATION: Education details: Eval findings, POC, HEP, self care Person educated: Patient Education method: Explanation, Demonstration, Tactile cues, and Verbal cues Education comprehension: verbalized understanding, returned demonstration, verbal cues required, and tactile cues required   HOME EXERCISE PROGRAM: NA  ASSESSMENT:  CLINICAL IMPRESSION: Patient is a 61 y.o. male who was seen today for physical therapy evaluation and treatment for bicep tendonitis.   OBJECTIVE IMPAIRMENTS increased fascial restrictions, impaired UE functional use, and pain.   ACTIVITY LIMITATIONS carrying, lifting, and reach over head  PARTICIPATION LIMITATIONS: cleaning and occupation  PERSONAL FACTORS Past/current experiences, Profession, and Time since onset of injury/illness/exacerbation are also affecting patient's functional outcome.   REHAB POTENTIAL: Good  CLINICAL DECISION MAKING: Stable/uncomplicated  EVALUATION COMPLEXITY: Low   GOALS:  SHORT TERM GOALS = LTGs   LONG TERM GOALS: Target date: 05/26/2022   Pt will be Ind in a final HEP to maintain achieved LOF and QOL Baseline: not started Goal status: INITIAL  2.  Pt will voice understanding of measures to assist in pain reduction  Baseline: started Goal status: INITIAL  3.  Pt's FOTO score will improved to the predicted value of 73% as indication of improved function Baseline: 62% Goal status: INITIAL  4.  Pt will report a decrease in R elbow  pain to 4/10 or less with daily and work related activities for improved R UE function Baseline: 0-9/10 Goal status: INITIAL  5.  Pt will report no difficulty with lighting 10# overhead as indication of improved function with less pain Baseline: Pt reports some difficulty Goal status: INITIAL   PLAN: PT FREQUENCY: 2x/week  PT DURATION: 4 weeks  PLANNED INTERVENTIONS: Therapeutic exercises, Therapeutic activity, Patient/Family education, Self Care, Joint mobilization, Dry Needling, Cryotherapy, Moist heat, Taping, Ultrasound, Ionotophoresis '4mg'$ /ml Dexamethasone, Manual therapy, and Re-evaluation  PLAN FOR NEXT SESSION: Review FOTO; assess response to ionto and CFM; progress therex as indicated; use of modalities, manual therapy; and TPDN as indicated.   Derren Suydam MS, PT 04/21/22 4:04 PM

## 2022-04-24 ENCOUNTER — Ambulatory Visit: Payer: No Typology Code available for payment source | Admitting: Audiologist

## 2022-04-24 DIAGNOSIS — H903 Sensorineural hearing loss, bilateral: Secondary | ICD-10-CM

## 2022-04-24 DIAGNOSIS — H9313 Tinnitus, bilateral: Secondary | ICD-10-CM

## 2022-04-24 DIAGNOSIS — M25521 Pain in right elbow: Secondary | ICD-10-CM | POA: Diagnosis not present

## 2022-04-24 NOTE — Procedures (Signed)
  [facility] [place], [place]  [id number] [phone]  AUDIOLOGICAL  EVALUATION  NAME: Jeffrey Griffin     DOB:   03-30-1961      MRN: [id number]                                                                                     DATE: 04/24/2022     REFERENT: [name], DO STATUS: Outpatient DIAGNOSIS: Mild Sensorineural Hearing Loss, Tinnitus     History: Jeffrey Griffin was seen for an audiological evaluation.  Jeffrey Griffin is receiving a hearing evaluation due to concerns for increased ringing in his ears while he has been off work. 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 Jeffrey Griffin has been home recovering from surgery and his tinnitus has gotten louder. Jeffrey Griffin has som difficulty hearing in noisy places. This difficulty began gradually. No pain or pressure reported in either ear. Tinnitus in both ears and sounds like a low white noise. 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  Medical history negative for a condition which is a risk factor for hearing loss. No other relevant case history reported.   Evaluation:  Otoscopy showed a clear view of the tympanic membranes, bilaterally Tympanometry results were consistent with normal middle ear function, bilaterally   Audiometric testing was completed using conventional audiometry with insert transducer. Speech Recognition Thresholds were  20dB in the right ear and 20dB in the left ear. Word Recognition was performed 40dB SL, scored  100% in the right ear and 96% in the left ear. Pure tone thresholds show slight to mild sensorineural hearing loss in each ear, consistent with noise notch centered at Jeffrey Griffin.  Tinnitus matched to 1kHz at 26dB, which is 6dB SL. On 1-10 scale with 10 perfect match, Jeffrey Griffin rated this an 8.   Results:  The test results were reviewed with Jeffrey Griffin. He has a mild sensorineural  hearing loss in each ear in the highest pitches. He is not a hearing aid candidate. This loss will not impact his ability to hear speech on a daily basis. Recommend using environmental masking to keep from fixating on tinnitus. Once he is back working he will likely not notice the tinnitus as much. Wear hearing protection as necessary at work. Due to very mild degree of loss, no need for annual monitoring hearing at this time.    Recommendations: 1.   No further audiologic testing is needed unless future hearing concerns arise.   25 minutes spent testing and counseling on results.   Alfonse Alpers  Audiologist, Au.D., CCC-A 04/24/2022  8:25 AM  Cc: [name], DO

## 2022-04-25 ENCOUNTER — Ambulatory Visit
Admission: RE | Admit: 2022-04-25 | Discharge: 2022-04-25 | Disposition: A | Discharge status: Home / Self Care | Payer: No Typology Code available for payment source | Source: Ambulatory Visit | Attending: Sports Medicine | Admitting: Sports Medicine

## 2022-04-25 ENCOUNTER — Ambulatory Visit: Payer: No Typology Code available for payment source

## 2022-04-25 DIAGNOSIS — M25521 Pain in right elbow: Secondary | ICD-10-CM | POA: Diagnosis not present

## 2022-04-25 DIAGNOSIS — M25512 Pain in left shoulder: Secondary | ICD-10-CM

## 2022-04-25 NOTE — Therapy (Signed)
OUTPATIENT PHYSICAL THERAPY TREATMENT NOTE   Patient Name: Jeffrey Griffin MRN: [id number] DOB:August 08, 1961, 61 y.o., male Today's Date: 04/26/2022  PCP: [name], DO REFERRING PROVIDER: Thurman Coyer, DO  END OF SESSION:   PT End of Session - 04/25/22 1612     Visit Number 2    Number of Visits 9    Date for PT Re-Evaluation 05/26/22    Authorization Type [place] PREFERRED    PT Start Time 1615    PT Stop Time 1655    PT Time Calculation (min) 40 min    Activity Tolerance Patient tolerated treatment well    Behavior During Therapy WFL for tasks assessed/performed             Past Medical History:  Diagnosis Date   Abnormality of thoracic aorta    4.3 CM ECTATIC ASENDING    Cardiopathy    CHF (congestive heart failure) ([place])    SYSTOLIC   Epistaxis 3/87/5643   Hyperlipidemia    Hypertension    Lung nodule    Obesity    OSA (obstructive sleep apnea)    Persistent atrial fibrillation (HCC)    WITH RVR   Pleural effusion    RIGHT UPPER LOBE AND RIGHT LOWER LOBE    Past Surgical History:  Procedure Laterality Date   APPENDECTOMY     ATRIAL FIBRILLATION ABLATION N/A 07/16/2020   Procedure: ATRIAL FIBRILLATION ABLATION;  Surgeon: [name], MD;  Location: [facility];  Service: Cardiovascular;  Laterality: N/A;   CARDIOVERSION N/A 10/20/2019   Procedure: CARDIOVERSION;  Surgeon: Acie Fredrickson Wonda Cheng, MD;  Location: [place] [facility];  Service: Cardiovascular;  Laterality: N/A;   CARDIOVERSION N/A 05/31/2020   Procedure: CARDIOVERSION;  Surgeon: Josue Hector, MD;  Location: [facility];  Service: Cardiovascular;  Laterality: N/A;   IR ANGIOGRAM PULMONARY BILATERAL SELECTIVE  03/09/2022   IR ANGIOGRAM SELECTIVE EACH ADDITIONAL VESSEL  03/09/2022   IR ANGIOGRAM SELECTIVE EACH ADDITIONAL VESSEL  03/09/2022   IR THROMBECT PRIM MECH INIT (INCLU) MOD SED  03/09/2022   IR US GUIDE VASC ACCESS RIGHT  03/09/2022   RADIOLOGY WITH ANESTHESIA N/A 03/09/2022   Procedure: IR  WITH ANESTHESIA;  Surgeon: Radiologist, Medication, MD;  Location: [facility];  Service: Radiology;  Laterality: N/A;   VASECTOMY     Patient Active Problem List   Diagnosis Date Noted   Knee pain, bilateral 03/12/2022   Pulmonary embolism ([place]) 03/09/2022   OSA (obstructive sleep apnea) 03/09/2022   PE (pulmonary thromboembolism) ([place]) 03/09/2022   S/P prostatectomy    Paroxysmal atrial fibrillation (HCC)    Fatigue 03/04/2021   Subcutaneous nodule of left foot 03/04/2021   Atypical atrial flutter ([place]) 05/26/2020   Secondary hypercoagulable state ([place]) 04/13/2020   Chronic combined systolic and diastolic CHF (congestive heart failure) ([place]) 04/06/2020   Persistent atrial fibrillation ([place]) 10/01/2019   Pulmonary embolus ([place]) 10/01/2019    REFERRING DIAG: M25.521 (ICD-10-CM) - Right elbow pain; Biceps tendonitis right  THERAPY DIAG:  Pain in right elbow  Rationale for Evaluation and Treatment Rehabilitation  PERTINENT HISTORY: CHF, Afib  PRECAUTIONS: None  SUBJECTIVE:  Pt presents to PT with reports of continued R bicep pain. Is ready to begin PT at this time.    PAIN:  Are you having pain?  Yes: NPRS scale: 3/10 Pain location: R elbow Pain description: ache and sharp Aggravating factors: Elbow extension and flexion end range, certain r arm movements Relieving factors: rest and support, ice pack Pain range on  eval: 0-9/10   OBJECTIVE: (objective measures completed at initial evaluation unless otherwise dated)  DIAGNOSTIC FINDINGS:  R elbow Xray 04/18/22 IMPRESSION: Negative.   PATIENT SURVEYS:  FOTO Perceived function 62%, predicted 73%   COGNITION:           Overall cognitive status: Within functional limits for tasks assessed                                  SENSATION: WFL   POSTURE: Forward head and rounded shoulders   UPPER EXTREMITY ROM:                        WNLs Active ROM Right eval Left eval  Shoulder flexion      Shoulder extension       Shoulder abduction      Shoulder adduction      Shoulder internal rotation      Shoulder external rotation Pain at end range    Elbow flexion Pain at end range    Elbow extension      Wrist flexion      Wrist extension      Wrist ulnar deviation      Wrist radial deviation      Wrist pronation      Wrist supination Pain at end range    (Blank rows = not tested)   UPPER EXTREMITY MMT:   MMT Right eval Left eval  Shoulder flexion 5    Shoulder extension 5    Shoulder abduction 5    Shoulder adduction 5    Shoulder internal rotation 5, pain    Shoulder external rotation 5    Middle trapezius      Lower trapezius      Elbow flexion 5, pain    Elbow extension 5    Wrist flexion 5, pain    Wrist extension 5    Wrist ulnar deviation 5    Wrist radial deviation 5    Wrist pronation 5    Wrist supination 5    Grip strength (lbs) TBA TBA  (Blank rows = not tested)   SHOULDER SPECIAL TESTS:            Biceps assessment: Yergason's test: negative and Speed's test: negative   JOINT MOBILITY TESTING:  Medial and lateral elbow ligament stress testing negative   PALPATION:  TTP to the distal aspect and insertion of the bicep tendon             TODAY'S TREATMENT:  [facility] Adult PT Treatment:                                                DATE: 04/25/2022 Therapeutic Exercise: Eccentric lowering elbow flexion 10# DB 2x10 R Pronation/supination R 2x10 5# DB R bicep stretch on wall 2x30" Manual Therapy: Cross friction massage to R bicep (muscle belly and Musculo-tendon junction) IASTM to R wrist ext, supinator  [facility] Adult PT Treatment:                                                DATE: 04/21/22 Modalities: Iontophoresis to the ant  aspect of the R elbow over the insertion of the distal bicep tendon '4mg'$ /ml, 1 ml Instructed pt in 6 hour wear time and possible skin reaction to the adhesive of the patch Self Care: CFM to the distal bicep tendon 3 to 4x daily for 3-5 mins    PATIENT EDUCATION: Education details: HEP, CFM Person educated: Patient Education method: Explanation, Demonstration, Tactile cues, and Verbal cues Education comprehension: verbalized understanding, returned demonstration, verbal cues required, and tactile cues required    HOME EXERCISE PROGRAM: Access Code: [email or web address] URL: [email or web address] Date: 04/25/2022 Prepared by: Octavio Manns  Exercises - Seated Elbow Flexion and Extension AROM  - 1 x daily - 7 x weekly - 3 sets - 10 reps - Forearm Pronation and Supination with Hammer  - 1 x daily - 7 x weekly - 3 sets - 10 reps - Standing Bicep Stretch at Wall  - 1 x daily - 7 x weekly - 2-3 reps - 30 sec hold   ASSESSMENT:   CLINICAL IMPRESSION: Pt responded well to PT treatment today, noting decrease in pain to 0/10 post session. HEP created for eccentric loading to R elbow as well as continued education on CFM. PT will assess response to HEP and progress as tolerated.    OBJECTIVE IMPAIRMENTS increased fascial restrictions, impaired UE functional use, and pain.    ACTIVITY LIMITATIONS carrying, lifting, and reach over head   PARTICIPATION LIMITATIONS: cleaning and occupation   PERSONAL FACTORS Past/current experiences, Profession, and Time since onset of injury/illness/exacerbation are also affecting patient's functional outcome.      GOALS:   SHORT TERM GOALS = LTGs     LONG TERM GOALS: Target date: 05/26/2022    Pt will be Ind in a final HEP to maintain achieved LOF and QOL Baseline: not started Goal status: INITIAL   2.  Pt will voice understanding of measures to assist in pain reduction  Baseline: started Goal status: INITIAL   3.  Pt's FOTO score will improved to the predicted value of 73% as indication of improved function Baseline: 62% Goal status: INITIAL   4.  Pt will report a decrease in R elbow pain to 4/10 or less with daily and work related activities for improved R UE function Baseline:  0-9/10 Goal status: INITIAL   5.  Pt will report no difficulty with lighting 10# overhead as indication of improved function with less pain Baseline: Pt reports some difficulty Goal status: INITIAL     PLAN: PT FREQUENCY: 2x/week   PT DURATION: 4 weeks   PLANNED INTERVENTIONS: Therapeutic exercises, Therapeutic activity, Patient/Family education, Self Care, Joint mobilization, Dry Needling, Cryotherapy, Moist heat, Taping, Ultrasound, Ionotophoresis '4mg'$ /ml Dexamethasone, Manual therapy, and Re-evaluation   PLAN FOR NEXT SESSION: Review FOTO; assess response to ionto and CFM; progress therex as indicated; use of modalities, manual therapy; and TPDN as indicated.   Ward Chatters, PT 04/26/2022, 8:41 AM

## 2022-04-28 ENCOUNTER — Other Ambulatory Visit: Payer: No Typology Code available for payment source

## 2022-04-28 ENCOUNTER — Ambulatory Visit: Payer: No Typology Code available for payment source

## 2022-05-01 ENCOUNTER — Encounter: Payer: Self-pay | Admitting: Family Medicine

## 2022-05-01 ENCOUNTER — Telehealth: Payer: Self-pay | Admitting: Sports Medicine

## 2022-05-01 ENCOUNTER — Encounter: Payer: Self-pay | Admitting: Sports Medicine

## 2022-05-01 NOTE — Telephone Encounter (Signed)
  I spoke with Jeffrey Griffin on the phone today after reviewing MRI findings left shoulder.  He has moderate rotator cuff tendinopathy but no obvious tear.  Dominant finding is significant age advanced glenohumeral joint DJD.  He also has a degenerated and torn superior labrum.  He has had pain in the shoulder for a few years.  He is currently working as a Dealer.  Based on his length of symptoms, MRI findings, in light of work I recommended consultation with orthopedic surgery to discuss treatment options including possible surgery.  Patient is in agreement with that plan.  I will defer further work-up and treatment to the discretion of the orthopedist and [name] will follow-up with me as needed.  This note was dictated using Dragon naturally speaking software and may contain errors in syntax, spelling, or content which have not been identified prior to signing this note.

## 2022-05-01 NOTE — Telephone Encounter (Signed)
[facility] [address] [place]ia [place] Tuesday 8.29.23 at 11:15am [place] Arrival time is 11a

## 2022-05-02 NOTE — Therapy (Signed)
OUTPATIENT PHYSICAL THERAPY TREATMENT NOTE   Patient Name: Jeffrey Griffin MRN: [id number] DOB:03-15-1961, 61 y.o., male Today's Date: 05/03/2022  PCP: [name], DO REFERRING PROVIDER: Thurman Coyer, DO  END OF SESSION:   PT End of Session - 05/03/22 1710     Visit Number 3    Number of Visits 9    Date for PT Re-Evaluation 05/26/22    Authorization Type [place] PREFERRED    PT Start Time 0718    PT Stop Time 0800    PT Time Calculation (min) 42 min    Activity Tolerance Patient tolerated treatment well    Behavior During Therapy WFL for tasks assessed/performed              Past Medical History:  Diagnosis Date   Abnormality of thoracic aorta    4.3 CM ECTATIC ASENDING    Cardiopathy    CHF (congestive heart failure) ([place])    SYSTOLIC   Epistaxis 1/60/7371   Hyperlipidemia    Hypertension    Lung nodule    Obesity    OSA (obstructive sleep apnea)    Persistent atrial fibrillation ([place])    WITH RVR   Pleural effusion    RIGHT UPPER LOBE AND RIGHT LOWER LOBE    Past Surgical History:  Procedure Laterality Date   APPENDECTOMY     ATRIAL FIBRILLATION ABLATION N/A 07/16/2020   Procedure: ATRIAL FIBRILLATION ABLATION;  Surgeon: [name], MD;  Location: [facility];  Service: Cardiovascular;  Laterality: N/A;   CARDIOVERSION N/A 10/20/2019   Procedure: CARDIOVERSION;  Surgeon: Acie Fredrickson Wonda Cheng, MD;  Location: [facility];  Service: Cardiovascular;  Laterality: N/A;   CARDIOVERSION N/A 05/31/2020   Procedure: CARDIOVERSION;  Surgeon: Josue Hector, MD;  Location: [facility];  Service: Cardiovascular;  Laterality: N/A;   IR ANGIOGRAM PULMONARY BILATERAL SELECTIVE  03/09/2022   IR ANGIOGRAM SELECTIVE EACH ADDITIONAL VESSEL  03/09/2022   IR ANGIOGRAM SELECTIVE EACH ADDITIONAL VESSEL  03/09/2022   IR THROMBECT PRIM MECH INIT (INCLU) MOD SED  03/09/2022   IR US GUIDE VASC ACCESS RIGHT  03/09/2022   RADIOLOGY WITH ANESTHESIA N/A 03/09/2022   Procedure: IR  WITH ANESTHESIA;  Surgeon: Radiologist, Medication, MD;  Location: [facility];  Service: Radiology;  Laterality: N/A;   VASECTOMY     Patient Active Problem List   Diagnosis Date Noted   Knee pain, bilateral 03/12/2022   Pulmonary embolism ([place]) 03/09/2022   OSA (obstructive sleep apnea) 03/09/2022   PE (pulmonary thromboembolism) ([place]) 03/09/2022   S/P prostatectomy    Paroxysmal atrial fibrillation (HCC)    Fatigue 03/04/2021   Subcutaneous nodule of left foot 03/04/2021   Atypical atrial flutter ([place]) 05/26/2020   Secondary hypercoagulable state ([place]) 04/13/2020   Chronic combined systolic and diastolic CHF (congestive heart failure) ([place]) 04/06/2020   Persistent atrial fibrillation ([place]) 10/01/2019   Pulmonary embolus ([place]) 10/01/2019    REFERRING DIAG: M25.521 (ICD-10-CM) - Right elbow pain; Biceps tendonitis right  THERAPY DIAG:  Pain in right elbow  Rationale for Evaluation and Treatment Rehabilitation  PERTINENT HISTORY: CHF, Afib  PRECAUTIONS: None  SUBJECTIVE:  Pt reports less pain and better movement of his R elbow. Pt has not returned to work yet.  PAIN:  Are you having pain?  Yes: NPRS scale: 1/10 Pain location: R elbow Pain description: ache and sharp Aggravating factors: Elbow extension and flexion end range, certain r arm movements Relieving factors: rest and support, ice pack Pain range on eval: 0-3/10  OBJECTIVE: (objective measures completed at initial evaluation unless otherwise dated)  DIAGNOSTIC FINDINGS:  R elbow Xray 04/18/22 IMPRESSION: Negative.   PATIENT SURVEYS:  FOTO Perceived function 62%, predicted 73%   COGNITION:           Overall cognitive status: Within functional limits for tasks assessed                                  SENSATION: WFL   POSTURE: Forward head and rounded shoulders   UPPER EXTREMITY ROM:                        WNLs Active ROM Right eval Left eval  Shoulder flexion      Shoulder extension      Shoulder  abduction      Shoulder adduction      Shoulder internal rotation      Shoulder external rotation Pain at end range    Elbow flexion Pain at end range    Elbow extension      Wrist flexion      Wrist extension      Wrist ulnar deviation      Wrist radial deviation      Wrist pronation      Wrist supination Pain at end range    (Blank rows = not tested)   UPPER EXTREMITY MMT:   MMT Right eval Left eval  Shoulder flexion 5    Shoulder extension 5    Shoulder abduction 5    Shoulder adduction 5    Shoulder internal rotation 5, pain    Shoulder external rotation 5    Middle trapezius      Lower trapezius      Elbow flexion 5, pain    Elbow extension 5    Wrist flexion 5, pain    Wrist extension 5    Wrist ulnar deviation 5    Wrist radial deviation 5    Wrist pronation 5    Wrist supination 5    Grip strength (lbs) TBA TBA  (Blank rows = not tested)   SHOULDER SPECIAL TESTS:            Biceps assessment: Yergason's test: negative and Speed's test: negative   JOINT MOBILITY TESTING:  Medial and lateral elbow ligament stress testing negative   PALPATION:  TTP to the distal aspect and insertion of the bicep tendon             TODAY'S TREATMENT:  [facility] Adult PT Treatment:                                                DATE: 05/03/22 Therapeutic Exercise: UBE 46mns L1, 2 mins forward and backwards each  Eccentric lowering elbow flexion 10# DB 2x10 R Pronation/supination 3x10 5# DB R R bicep stretch on wall 2x30" Manual Therapy: Cross friction massage to R bicep (muscle belly and Musculo-tendon junction) STM/DTM to R wrist ext, supinator  [facility] Adult PT Treatment:                                                DATE:  04/25/2022 Therapeutic Exercise: Eccentric lowering elbow flexion 10# DB 2x10 R Pronation/supination R 2x10 5# DB R bicep stretch on wall 2x30" Manual Therapy: Cross friction massage to R bicep (muscle belly and Musculo-tendon junction) IASTM to R wrist  ext, supinator  [facility] Adult PT Treatment:                                                DATE: 04/21/22 Modalities: Iontophoresis to the ant aspect of the R elbow over the insertion of the distal bicep tendon '4mg'$ /ml, 1 ml Instructed pt in 6 hour wear time and possible skin reaction to the adhesive of the patch Self Care: CFM to the distal bicep tendon 3 to 4x daily for 3-5 mins   PATIENT EDUCATION: Education details: HEP, CFM Person educated: Patient Education method: Explanation, Demonstration, Tactile cues, and Verbal cues Education comprehension: verbalized understanding, returned demonstration, verbal cues required, and tactile cues required    HOME EXERCISE PROGRAM: Access Code: [email or web address] URL: [email or web address] Date: 04/25/2022 Prepared by: Octavio Manns  Exercises - Seated Elbow Flexion and Extension AROM  - 1 x daily - 7 x weekly - 3 sets - 10 reps - Forearm Pronation and Supination with Hammer  - 1 x daily - 7 x weekly - 3 sets - 10 reps - Standing Bicep Stretch at Wall  - 1 x daily - 7 x weekly - 2-3 reps - 30 sec hold   ASSESSMENT:   CLINICAL IMPRESSION: PT was provided for strengthening and mobility of the bicipital muscle and musculotendinous junction. STM/DTM/CFM were also provided to this area to decrease taut muscles, TrPs, and for tissue remodeling. Pt demonstrates good understanding of his HEP and reports completing consistently. Pt has had a favorable response to PT with a decrease in his R elbow pain range.   OBJECTIVE IMPAIRMENTS increased fascial restrictions, impaired UE functional use, and pain.    ACTIVITY LIMITATIONS carrying, lifting, and reach over head   PARTICIPATION LIMITATIONS: cleaning and occupation   PERSONAL FACTORS Past/current experiences, Profession, and Time since onset of injury/illness/exacerbation are also affecting patient's functional outcome.      GOALS:   SHORT TERM GOALS = LTGs     LONG TERM GOALS: Target date:  05/26/2022    Pt will be Ind in a final HEP to maintain achieved LOF and QOL Baseline: not started Goal status: INITIAL   2.  Pt will voice understanding of measures to assist in pain reduction  Baseline: started Goal status: INITIAL   3.  Pt's FOTO score will improved to the predicted value of 73% as indication of improved function Baseline: 62% Goal status: INITIAL   4.  Pt will report a decrease in R elbow pain to 4/10 or less with daily and work related activities for improved R UE function Baseline: 0-9/10 Goal status: INITIAL   5.  Pt will report no difficulty with lighting 10# overhead as indication of improved function with less pain Baseline: Pt reports some difficulty Goal status: INITIAL     PLAN: PT FREQUENCY: 2x/week   PT DURATION: 4 weeks   PLANNED INTERVENTIONS: Therapeutic exercises, Therapeutic activity, Patient/Family education, Self Care, Joint mobilization, Dry Needling, Cryotherapy, Moist heat, Taping, Ultrasound, Ionotophoresis '4mg'$ /ml Dexamethasone, Manual therapy, and Re-evaluation   PLAN FOR NEXT SESSION: Review FOTO; assess response to ionto and CFM; progress therex as indicated; use of modalities,  manual therapy; and TPDN as indicated.   Omid Deardorff MS, PT 05/03/22 5:23 PM

## 2022-05-03 ENCOUNTER — Ambulatory Visit: Payer: No Typology Code available for payment source

## 2022-05-03 DIAGNOSIS — M25521 Pain in right elbow: Secondary | ICD-10-CM | POA: Diagnosis not present

## 2022-05-04 NOTE — Therapy (Signed)
OUTPATIENT PHYSICAL THERAPY TREATMENT NOTE   Patient Name: Jeffrey Griffin MRN: [id number] DOB:[birth year], 61 y.o., male Today's Date: 05/05/2022  PCP: [name], DO REFERRING PROVIDER: Thurman Coyer, DO  END OF SESSION:   PT End of Session - 05/05/22 0847     Visit Number 4    Number of Visits 9    Date for PT Re-Evaluation 05/26/22    Authorization Type [place] PREFERRED    PT Start Time 0847    PT Stop Time 0927    PT Time Calculation (min) 40 min    Activity Tolerance Patient tolerated treatment well    Behavior During Therapy WFL for tasks assessed/performed               Past Medical History:  Diagnosis Date   Abnormality of thoracic aorta    4.3 CM ECTATIC ASENDING    Cardiopathy    CHF (congestive heart failure) ([place])    SYSTOLIC   Epistaxis 5/00/3704   Hyperlipidemia    Hypertension    Lung nodule    Obesity    OSA (obstructive sleep apnea)    Persistent atrial fibrillation ([place])    WITH RVR   Pleural effusion    RIGHT UPPER LOBE AND RIGHT LOWER LOBE    Past Surgical History:  Procedure Laterality Date   APPENDECTOMY     ATRIAL FIBRILLATION ABLATION N/A 07/16/2020   Procedure: ATRIAL FIBRILLATION ABLATION;  Surgeon: [name], MD;  Location: [facility];  Service: Cardiovascular;  Laterality: N/A;   CARDIOVERSION N/A 10/20/2019   Procedure: CARDIOVERSION;  Surgeon: Acie Fredrickson Wonda Cheng, MD;  Location: [facility];  Service: Cardiovascular;  Laterality: N/A;   CARDIOVERSION N/A 05/31/2020   Procedure: CARDIOVERSION;  Surgeon: Josue Hector, MD;  Location: [facility];  Service: Cardiovascular;  Laterality: N/A;   IR ANGIOGRAM PULMONARY BILATERAL SELECTIVE  03/09/2022   IR ANGIOGRAM SELECTIVE EACH ADDITIONAL VESSEL  03/09/2022   IR ANGIOGRAM SELECTIVE EACH ADDITIONAL VESSEL  03/09/2022   IR THROMBECT PRIM MECH INIT (INCLU) MOD SED  03/09/2022   IR US GUIDE VASC ACCESS RIGHT  03/09/2022   RADIOLOGY WITH ANESTHESIA N/A 03/09/2022   Procedure:  IR WITH ANESTHESIA;  Surgeon: Radiologist, Medication, MD;  Location: [facility];  Service: Radiology;  Laterality: N/A;   VASECTOMY     Patient Active Problem List   Diagnosis Date Noted   Knee pain, bilateral 03/12/2022   Pulmonary embolism ([place]) 03/09/2022   OSA (obstructive sleep apnea) 03/09/2022   PE (pulmonary thromboembolism) ([place]) 03/09/2022   S/P prostatectomy    Paroxysmal atrial fibrillation ([facility])    Fatigue 03/04/2021   Subcutaneous nodule of left foot 03/04/2021   Atypical atrial flutter ([place]) 05/26/2020   Secondary hypercoagulable state ([place]) 04/13/2020   Chronic combined systolic and diastolic CHF (congestive heart failure) ([place]) 04/06/2020   Persistent atrial fibrillation ([place]) 10/01/2019   Pulmonary embolus ([place]) 10/01/2019    REFERRING DIAG: M25.521 (ICD-10-CM) - Right elbow pain; Biceps tendonitis right  THERAPY DIAG:  Pain in right elbow  Rationale for Evaluation and Treatment Rehabilitation  PERTINENT HISTORY: CHF, Afib  PRECAUTIONS: None  SUBJECTIVE:  Pt reports continued slow improvement.  PAIN:  Are you having pain?  Yes: NPRS scale: 0/10 Pain location: R elbow Pain description: ache and sharp Aggravating factors: Elbow extension and flexion end range, certain r arm movements Relieving factors: rest and support, ice pack Pain range on eval: 0-3/10   OBJECTIVE: (objective measures completed at initial evaluation unless otherwise dated)  DIAGNOSTIC FINDINGS:  R elbow Xray 04/18/22 IMPRESSION: Negative.   PATIENT SURVEYS:  FOTO Perceived function 62%, predicted 73%   COGNITION:           Overall cognitive status: Within functional limits for tasks assessed                                  SENSATION: WFL   POSTURE: Forward head and rounded shoulders   UPPER EXTREMITY ROM:                        WNLs Active ROM Right eval Left eval  Shoulder flexion      Shoulder extension      Shoulder abduction      Shoulder adduction       Shoulder internal rotation      Shoulder external rotation Pain at end range    Elbow flexion Pain at end range    Elbow extension      Wrist flexion      Wrist extension      Wrist ulnar deviation      Wrist radial deviation      Wrist pronation      Wrist supination Pain at end range    (Blank rows = not tested)   UPPER EXTREMITY MMT:   MMT Right eval Left eval  Shoulder flexion 5    Shoulder extension 5    Shoulder abduction 5    Shoulder adduction 5    Shoulder internal rotation 5, pain    Shoulder external rotation 5    Middle trapezius      Lower trapezius      Elbow flexion 5, pain    Elbow extension 5    Wrist flexion 5, pain    Wrist extension 5    Wrist ulnar deviation 5    Wrist radial deviation 5    Wrist pronation 5    Wrist supination 5    Grip strength (lbs) TBA TBA  (Blank rows = not tested)   SHOULDER SPECIAL TESTS:            Biceps assessment: Yergason's test: negative and Speed's test: negative   JOINT MOBILITY TESTING:  Medial and lateral elbow ligament stress testing negative   PALPATION:  TTP to the distal aspect and insertion of the bicep tendon             TODAY'S TREATMENT:  [facility] Adult PT Treatment:                                                DATE: 05/05/22 Therapeutic Exercise: UBE 28mns L1, 2 mins forward and backwards each  Eccentric lowering elbow flexion 10# DB 2x10 R Pronation/supination 3x10 5# DB R R bicep stretch on wall 2x30" Manual Therapy: Cross friction massage to R bicep (muscle belly and Musculo-tendon junction) STM/DTM/MTPR to R bicep, wrist ext, supinator  [facility] Adult PT Treatment:                                                DATE: 05/03/22 Therapeutic Exercise: UBE 432ms L1, 2 mins forward and  backwards each  Eccentric lowering elbow flexion 10# DB 2x10 R Pronation/supination 3x10 5# DB R R bicep stretch on wall 2x30" Manual Therapy: Cross friction massage to R bicep (muscle belly and Musculo-tendon  junction) STM/DTM to R wrist ext, supinator  [facility] Adult PT Treatment:                                                DATE: 04/25/2022 Therapeutic Exercise: Eccentric lowering elbow flexion 10# DB 2x10 R Pronation/supination R 2x10 5# DB R bicep stretch on wall 2x30" Manual Therapy: Cross friction massage to R bicep (muscle belly and Musculo-tendon junction) IASTM to R wrist ext, supinator  [facility] Adult PT Treatment:                                                DATE: 04/21/22 Modalities: Iontophoresis to the ant aspect of the R elbow over the insertion of the distal bicep tendon 68m/ml, 1 ml Instructed pt in 6 hour wear time and possible skin reaction to the adhesive of the patch Self Care: CFM to the distal bicep tendon 3 to 4x daily for 3-5 mins   PATIENT EDUCATION: Education details: HEP, CFM Person educated: Patient Education method: Explanation, Demonstration, Tactile cues, and Verbal cues Education comprehension: verbalized understanding, returned demonstration, verbal cues required, and tactile cues required    HOME EXERCISE PROGRAM: Access Code: [email or web address]: [email or web address] Date: 04/25/2022 Prepared by: DOctavio Manns Exercises - Seated Elbow Flexion and Extension AROM  - 1 x daily - 7 x weekly - 3 sets - 10 reps - Forearm Pronation and Supination with Hammer  - 1 x daily - 7 x weekly - 3 sets - 10 reps - Standing Bicep Stretch at Wall  - 1 x daily - 7 x weekly - 2-3 reps - 30 sec hold   ASSESSMENT:   CLINICAL IMPRESSION: Pt is responding positively to current course of PT for address taut muscles, musculotendinous remodeling, and strengthening. Pt will continue to benefit from skilled PT to address deficits to optimize R UE function.   OBJECTIVE IMPAIRMENTS increased fascial restrictions, impaired UE functional use, and pain.   ACTIVITY LIMITATIONS carrying, lifting, and reach over head   PARTICIPATION LIMITATIONS: cleaning and occupation    PERSONAL FACTORS Past/current experiences, Profession, and Time since onset of injury/illness/exacerbation are also affecting patient's functional outcome.      GOALS:   SHORT TERM GOALS = LTGs     LONG TERM GOALS: Target date: 05/26/2022    Pt will be Ind in a final HEP to maintain achieved LOF and QOL Baseline: not started Goal status: MET   2.  Pt will voice understanding of measures to assist in pain reduction  Baseline: started Goal status: MET   3.  Pt's FOTO score will improved to the predicted value of 73% as indication of improved function Baseline: 62% Goal status: INITIAL   4.  Pt will report a decrease in R elbow pain to 4/10 or less with daily and work related activities for improved R UE function Baseline: 0-9/10 Goal status: INITIAL   5.  Pt will report no difficulty with lighting 10# overhead as indication of improved function with less pain Baseline: Pt reports  some difficulty Goal status: INITIAL     PLAN: PT FREQUENCY: 2x/week   PT DURATION: 4 weeks   PLANNED INTERVENTIONS: Therapeutic exercises, Therapeutic activity, Patient/Family education, Self Care, Joint mobilization, Dry Needling, Cryotherapy, Moist heat, Taping, Ultrasound, Ionotophoresis 59m/ml Dexamethasone, Manual therapy, and Re-evaluation   PLAN FOR NEXT SESSION: Review FOTO; assess response to ionto and CFM; progress therex as indicated; use of modalities, manual therapy; and TPDN as indicated. Progress physical demand for strengthening the biceps and for overall upper body strengthening next week in prep for return to work.  Reid Nawrot MS, PT 05/05/22 9:33 AM

## 2022-05-05 ENCOUNTER — Ambulatory Visit: Payer: No Typology Code available for payment source | Attending: Sports Medicine

## 2022-05-05 DIAGNOSIS — M25521 Pain in right elbow: Secondary | ICD-10-CM | POA: Diagnosis present

## 2022-05-05 DIAGNOSIS — G8929 Other chronic pain: Secondary | ICD-10-CM | POA: Diagnosis present

## 2022-05-05 DIAGNOSIS — M25512 Pain in left shoulder: Secondary | ICD-10-CM | POA: Diagnosis present

## 2022-05-09 ENCOUNTER — Encounter: Payer: Self-pay | Admitting: Physical Therapy

## 2022-05-09 ENCOUNTER — Ambulatory Visit: Payer: No Typology Code available for payment source | Admitting: Physical Therapy

## 2022-05-09 DIAGNOSIS — M25521 Pain in right elbow: Secondary | ICD-10-CM | POA: Diagnosis not present

## 2022-05-09 NOTE — Therapy (Signed)
OUTPATIENT PHYSICAL THERAPY TREATMENT NOTE   Patient Name: Jeffrey Griffin MRN: [id number] DOB:13-Nov-1960, 61 y.o., male Today's Date: 05/09/2022  PCP: [name], DO REFERRING PROVIDER: Thurman Coyer, DO  END OF SESSION:   PT End of Session - 05/09/22 0804     Visit Number 5    Number of Visits 9    Date for PT Re-Evaluation 05/26/22    Authorization Type [place] PREFERRED    PT Start Time 0801    PT Stop Time 4166    PT Time Calculation (min) 40 min               Past Medical History:  Diagnosis Date   Abnormality of thoracic aorta    4.3 CM ECTATIC ASENDING    Cardiopathy    CHF (congestive heart failure) ([place])    SYSTOLIC   Epistaxis 0/63/0160   Hyperlipidemia    Hypertension    Lung nodule    Obesity    OSA (obstructive sleep apnea)    Persistent atrial fibrillation ([facility])    WITH RVR   Pleural effusion    RIGHT UPPER LOBE AND RIGHT LOWER LOBE    Past Surgical History:  Procedure Laterality Date   APPENDECTOMY     ATRIAL FIBRILLATION ABLATION N/A 07/16/2020   Procedure: ATRIAL FIBRILLATION ABLATION;  Surgeon: [name], MD;  Location: [facility];  Service: Cardiovascular;  Laterality: N/A;   CARDIOVERSION N/A 10/20/2019   Procedure: CARDIOVERSION;  Surgeon: Acie Fredrickson Wonda Cheng, MD;  Location: [facility];  Service: Cardiovascular;  Laterality: N/A;   CARDIOVERSION N/A 05/31/2020   Procedure: CARDIOVERSION;  Surgeon: Josue Hector, MD;  Location: [facility];  Service: Cardiovascular;  Laterality: N/A;   IR ANGIOGRAM PULMONARY BILATERAL SELECTIVE  03/09/2022   IR ANGIOGRAM SELECTIVE EACH ADDITIONAL VESSEL  03/09/2022   IR ANGIOGRAM SELECTIVE EACH ADDITIONAL VESSEL  03/09/2022   IR THROMBECT PRIM MECH INIT (INCLU) MOD SED  03/09/2022   IR US GUIDE VASC ACCESS RIGHT  03/09/2022   RADIOLOGY WITH ANESTHESIA N/A 03/09/2022   Procedure: IR WITH ANESTHESIA;  Surgeon: Radiologist, Medication, MD;  Location: [facility];  Service: Radiology;  Laterality: N/A;    VASECTOMY     Patient Active Problem List   Diagnosis Date Noted   Knee pain, bilateral 03/12/2022   Pulmonary embolism ([place]) 03/09/2022   OSA (obstructive sleep apnea) 03/09/2022   PE (pulmonary thromboembolism) ([place]) 03/09/2022   S/P prostatectomy    Paroxysmal atrial fibrillation ([facility])    Fatigue 03/04/2021   Subcutaneous nodule of left foot 03/04/2021   Atypical atrial flutter ([place]) 05/26/2020   Secondary hypercoagulable state ([place]) 04/13/2020   Chronic combined systolic and diastolic CHF (congestive heart failure) ([place]) 04/06/2020   Persistent atrial fibrillation ([place]) 10/01/2019   Pulmonary embolus ([place]) 10/01/2019    REFERRING DIAG: M25.521 (ICD-10-CM) - Right elbow pain; Biceps tendonitis right  THERAPY DIAG:  Pain in right elbow  Rationale for Evaluation and Treatment Rehabilitation  PERTINENT HISTORY: CHF, Afib  PRECAUTIONS: None  SUBJECTIVE:  A little sore after doing my morning exercises. No pain during the day. It has a slight ache. 1-2/10  PAIN:  Are you having pain?  Yes: NPRS scale: 1-2/10 Pain location: R elbow Pain description: ache and sharp Aggravating factors: Elbow extension and flexion end range, certain r arm movements Relieving factors: rest and support, ice pack Pain range on eval: 0-3/10   OBJECTIVE: (objective measures completed at initial evaluation unless otherwise dated)  DIAGNOSTIC FINDINGS:  R  elbow Xray 04/18/22 IMPRESSION: Negative.   PATIENT SURVEYS:  FOTO Perceived function 62%, predicted 73%   COGNITION:           Overall cognitive status: Within functional limits for tasks assessed                                  SENSATION: WFL   POSTURE: Forward head and rounded shoulders   UPPER EXTREMITY ROM:                        WNLs Active ROM Right eval Left eval  Shoulder flexion      Shoulder extension      Shoulder abduction      Shoulder adduction      Shoulder internal rotation      Shoulder external rotation  Pain at end range    Elbow flexion Pain at end range    Elbow extension      Wrist flexion      Wrist extension      Wrist ulnar deviation      Wrist radial deviation      Wrist pronation      Wrist supination Pain at end range    (Blank rows = not tested)   UPPER EXTREMITY MMT:   MMT Right eval Left eval  Shoulder flexion 5    Shoulder extension 5    Shoulder abduction 5    Shoulder adduction 5    Shoulder internal rotation 5, pain    Shoulder external rotation 5    Middle trapezius      Lower trapezius      Elbow flexion 5, pain    Elbow extension 5    Wrist flexion 5, pain    Wrist extension 5    Wrist ulnar deviation 5    Wrist radial deviation 5    Wrist pronation 5    Wrist supination 5    Grip strength (lbs) TBA TBA  (Blank rows = not tested)   SHOULDER SPECIAL TESTS:            Biceps assessment: Yergason's test: negative and Speed's test: negative   JOINT MOBILITY TESTING:  Medial and lateral elbow ligament stress testing negative   PALPATION:  TTP to the distal aspect and insertion of the bicep tendon             TODAY'S TREATMENT:  [facility] Adult PT Treatment:                                                DATE: 05/09/22 Therapeutic Exercise: UBE 48mns L2, 2 mins forward and backwards each  elbow flexion 10# DB 1x10 R Hammer curl 10# DB 1 x 10 R Hammer to overhead 6# 1 x 10  Hammer to overhead 8# 1 x 10  Pronation/supination 1x10 6# DB R Pronation/supination 2 x 10 3# on wooden handle Shoulder row blue 10 x 2  Right standing chest press 10 x 2 Blue Forward raise thumb 4# 10 x 2 Lateral raise thumb up# 10 x 2   R bicep stretch on wall 2x30" Manual Therapy: Cross friction massage to R bicep (muscle belly and Musculo-tendon junction) STM/DTM/MTPR to R bicep, wrist ext,   [facility] Adult PT Treatment:  DATE: 05/05/22 Therapeutic Exercise: UBE 38mns L1, 2 mins forward and backwards each  Eccentric lowering  elbow flexion 10# DB 2x10 R Pronation/supination 3x10 5# DB R R bicep stretch on wall 2x30" Manual Therapy: Cross friction massage to R bicep (muscle belly and Musculo-tendon junction) STM/DTM/MTPR to R bicep, wrist ext, supinator  [facility] Adult PT Treatment:                                                DATE: 05/03/22 Therapeutic Exercise: UBE 436ms L1, 2 mins forward and backwards each  Eccentric lowering elbow flexion 10# DB 2x10 R Pronation/supination 3x10 5# DB R R bicep stretch on wall 2x30" Manual Therapy: Cross friction massage to R bicep (muscle belly and Musculo-tendon junction) STM/DTM to R wrist ext, supinator  [facility] Adult PT Treatment:                                                DATE: 04/25/2022 Therapeutic Exercise: Eccentric lowering elbow flexion 10# DB 2x10 R Pronation/supination R 2x10 5# DB R bicep stretch on wall 2x30" Manual Therapy: Cross friction massage to R bicep (muscle belly and Musculo-tendon junction) IASTM to R wrist ext, supinator  [facility] Adult PT Treatment:                                                DATE: 04/21/22 Modalities: Iontophoresis to the ant aspect of the R elbow over the insertion of the distal bicep tendon 82m26ml, 1 ml Instructed pt in 6 hour wear time and possible skin reaction to the adhesive of the patch Self Care: CFM to the distal bicep tendon 3 to 4x daily for 3-5 mins   PATIENT EDUCATION: Education details: HEP, CFM Person educated: Patient Education method: Explanation, Demonstration, Tactile cues, and Verbal cues Education comprehension: verbalized understanding, returned demonstration, verbal cues required, and tactile cues required    HOME EXERCISE PROGRAM: Access Code: [email or web address]: [email or web address] Date: 04/25/2022 Prepared by: DavOctavio Mannsxercises - Seated Elbow Flexion and Extension AROM  - 1 x daily - 7 x weekly - 3 sets - 10 reps - Forearm Pronation and Supination with Hammer  - 1 x daily -  7 x weekly - 3 sets - 10 reps - Standing Bicep Stretch at Wall  - 1 x daily - 7 x weekly - 2-3 reps - 30 sec hold   ASSESSMENT:   CLINICAL IMPRESSION: Pt reports he does not experience day time pain most days. He is a little sore and achy this morning because he completed HEP prior to his appointment. Increased physical demand this visit with min increased pain. Will assess tolerance next visit. STW performed to decrease soreness at end of session. Pt reported no pain post.  Pt is Pt will continue to benefit from skilled PT to address deficits to optimize R UE function.   OBJECTIVE IMPAIRMENTS increased fascial restrictions, impaired UE functional use, and pain.   ACTIVITY LIMITATIONS carrying, lifting, and reach over head   PARTICIPATION LIMITATIONS: cleaning and occupation   PERSONAL FACTORS Past/current experiences, Profession, and Time since  onset of injury/illness/exacerbation are also affecting patient's functional outcome.      GOALS:   SHORT TERM GOALS = LTGs     LONG TERM GOALS: Target date: 05/26/2022    Pt will be Ind in a final HEP to maintain achieved LOF and QOL Baseline: not started Goal status: MET   2.  Pt will voice understanding of measures to assist in pain reduction  Baseline: started Goal status: MET   3.  Pt's FOTO score will improved to the predicted value of 73% as indication of improved function Baseline: 62% Goal status: INITIAL   4.  Pt will report a decrease in R elbow pain to 4/10 or less with daily and work related activities for improved R UE function Baseline: 0-9/10 Goal status: INITIAL   5.  Pt will report no difficulty with lighting 10# overhead as indication of improved function with less pain Baseline: Pt reports some difficulty Goal status: INITIAL     PLAN: PT FREQUENCY: 2x/week   PT DURATION: 4 weeks   PLANNED INTERVENTIONS: Therapeutic exercises, Therapeutic activity, Patient/Family education, Self Care, Joint mobilization,  Dry Needling, Cryotherapy, Moist heat, Taping, Ultrasound, Ionotophoresis 63m/ml Dexamethasone, Manual therapy, and Re-evaluation   PLAN FOR NEXT SESSION:  assess response to increased strengthening last  ionto and CFM; progress therex as indicated; use of modalities, manual therapy; and TPDN as indicated. Progress physical demand for strengthening the biceps and for overall upper body strengthening next week in prep for return to work.  JHessie Diener PTA 05/09/22 8:46 AM Phone: [phone]: [phone]

## 2022-05-10 NOTE — Therapy (Signed)
OUTPATIENT PHYSICAL THERAPY TREATMENT NOTE/Eval for the L Shoulder   Patient Name: Jeffrey Griffin MRN: [id number] DOB:25-Oct-1960, 62 y.o., male Today's Date: 05/11/2022  PCP: [name], DO REFERRING PROVIDER: Thurman Coyer, DO; Velora Mediate, MD  END OF SESSION:   PT End of Session - 05/11/22 0848     Visit Number 6    Number of Visits 14    Date for PT Re-Evaluation 06/17/22    Authorization Type [place] PREFERRED    PT Start Time 0848    PT Stop Time 0930    PT Time Calculation (min) 42 min    Activity Tolerance Patient tolerated treatment well    Behavior During Therapy WFL for tasks assessed/performed                Past Medical History:  Diagnosis Date   Abnormality of thoracic aorta    4.3 CM ECTATIC ASENDING    Cardiopathy    CHF (congestive heart failure) ([place])    SYSTOLIC   Epistaxis 09/12/3233   Hyperlipidemia    Hypertension    Lung nodule    Obesity    OSA (obstructive sleep apnea)    Persistent atrial fibrillation ([place])    WITH RVR   Pleural effusion    RIGHT UPPER LOBE AND RIGHT LOWER LOBE    Past Surgical History:  Procedure Laterality Date   APPENDECTOMY     ATRIAL FIBRILLATION ABLATION N/A 07/16/2020   Procedure: ATRIAL FIBRILLATION ABLATION;  Surgeon: [name], MD;  Location: [facility];  Service: Cardiovascular;  Laterality: N/A;   CARDIOVERSION N/A 10/20/2019   Procedure: CARDIOVERSION;  Surgeon: Acie Fredrickson Wonda Cheng, MD;  Location: [facility];  Service: Cardiovascular;  Laterality: N/A;   CARDIOVERSION N/A 05/31/2020   Procedure: CARDIOVERSION;  Surgeon: Josue Hector, MD;  Location: [facility];  Service: Cardiovascular;  Laterality: N/A;   IR ANGIOGRAM PULMONARY BILATERAL SELECTIVE  03/09/2022   IR ANGIOGRAM SELECTIVE EACH ADDITIONAL VESSEL  03/09/2022   IR ANGIOGRAM SELECTIVE EACH ADDITIONAL VESSEL  03/09/2022   IR THROMBECT PRIM MECH INIT (INCLU) MOD SED  03/09/2022   IR US GUIDE VASC ACCESS RIGHT  03/09/2022    RADIOLOGY WITH ANESTHESIA N/A 03/09/2022   Procedure: IR WITH ANESTHESIA;  Surgeon: Radiologist, Medication, MD;  Location: [facility];  Service: Radiology;  Laterality: N/A;   VASECTOMY     Patient Active Problem List   Diagnosis Date Noted   Knee pain, bilateral 03/12/2022   Pulmonary embolism ([place]) 03/09/2022   OSA (obstructive sleep apnea) 03/09/2022   PE (pulmonary thromboembolism) ([place]) 03/09/2022   S/P prostatectomy    Paroxysmal atrial fibrillation ([facility])    Fatigue 03/04/2021   Subcutaneous nodule of left foot 03/04/2021   Atypical atrial flutter ([place]) 05/26/2020   Secondary hypercoagulable state ([place]) 04/13/2020   Chronic combined systolic and diastolic CHF (congestive heart failure) ([place]) 04/06/2020   Persistent atrial fibrillation ([place]) 10/01/2019   Pulmonary embolus ([place]) 10/01/2019    REFERRING DIAG: M25.521 (ICD-10-CM) - Right elbow pain; Biceps tendonitis right; L shoulder OA and RC tendinopathy  THERAPY DIAG:  Pain in right elbow  Chronic left shoulder pain  Rationale for Evaluation and Treatment Rehabilitation  PERTINENT HISTORY: CHF, Afib  PRECAUTIONS: None  SUBJECTIVE:  Pt reports hx of L shoulder pain for a couple of years. Pt reports a shoulder replacement has been recommended, but he is wanting to try PT first. Pt received a cortisone injection last week and is doing better. Regarding his R  elbow, pt reports it is unbelievably better c no pain and better movement.  PAIN: L Shoulder Are you having pain? 1/10 Yes: NPRS scale: Prior injection 2-9/10 Pain location:L shoulder Pain description: ache and sharp Aggravating factors: Raising the the L arm, waking up with his L arm above his head, liftting weight [place] Relieving factors: rest and support, ice pack, voltaren gel  PAIN: R elbow Are you having pain? 0/10 Yes: NPRS scale: 0-1/10 Pain location: R elbow Pain description: ache and sharp Aggravating factors: Elbow extension and flexion end range, certain r  arm movements Relieving factors: rest and support, ice pack Pain range on eval: 0-3/10   OBJECTIVE: (objective measures completed at initial evaluation unless otherwise dated)  DIAGNOSTIC FINDINGS:   04/27/22 L shoulder MRI FINDINGS: Rotator cuff: Moderate rotator cuff tendinopathy/tendinosis with interstitial tears and areas of bursal and articular surface fraying and fibrillation. No full thickness retracted rotator cuff tear is identified.   Muscles:  No significant findings.   Biceps long head: Intact. Mild tendinopathy involving the intra-articular portion.   Acromioclavicular Joint: Moderate degenerative changes with joint effusion. Type 1-2 acromion. No lateral downsloping or subacromial spurring.   Glenohumeral Joint: Significant age advanced degenerative changes with significant cartilage loss, joint space narrowing, osteophytic spurring and subchondral cystic change. Small joint effusion.   Labrum: The superior labrum is degenerated and torn. The anterior and posterior labrum are grossly normal.   Bones:  No acute bony findings.   Other: Mild subacromial/subdeltoid bursitis. Moderate rotator interval synovitis.   IMPRESSION: 1. Moderate rotator cuff tendinopathy/tendinosis as detailed above. No full thickness retracted rotator cuff tear. 2. Intact long head biceps tendon. Mild tendinopathy involving the intra-articular portion. 3. Significant age advanced glenohumeral joint degenerative changes. 4. Degenerated and torn superior labrum. 5. Moderate AC joint degenerative changes but no other significant findings for bony impingement. 6. Mild subacromial/subdeltoid bursitis and moderate rotator interval synovitis.    R elbow Xray 04/18/22 IMPRESSION: Negative.   PATIENT SURVEYS:  FOTO Perceived function 62%, predicted 73%   COGNITION:           Overall cognitive status: Within functional limits for tasks assessed                                   SENSATION: WFL   POSTURE: Forward head and rounded shoulders   UPPER EXTREMITY ROM:                        WNLs Active ROM Right Eval 04/21/22 Left Eval 05/11/22  Shoulder flexion   WNLs pain only at end range  Shoulder extension    WNLs   Shoulder abduction    WNLs pain only at end range  Shoulder adduction      Shoulder internal rotation   T9 Painful  Shoulder external rotation Pain at end range T4   Elbow flexion Pain at end range    Elbow extension      Wrist flexion      Wrist extension      Wrist ulnar deviation      Wrist radial deviation      Wrist pronation      Wrist supination Pain at end range    (Blank rows = not tested)   UPPER EXTREMITY MMT:   MMT Right Eval 04/21/22 Left Eval 05/11/22  Shoulder flexion 5  5  Shoulder extension 5  5  Shoulder abduction 5  5  Shoulder adduction 5  5  Shoulder internal rotation 5, pain  5  Shoulder external rotation 5  5  Middle trapezius      Lower trapezius      Elbow flexion 5, pain  5  Elbow extension 5  5  Wrist flexion 5, pain    Wrist extension 5    Wrist ulnar deviation 5    Wrist radial deviation 5    Wrist pronation 5    Wrist supination 5    Grip strength (lbs) TBA TBA  (Blank rows = not tested)   SHOULDER SPECIAL TESTS:  05/11/22 [place]-Kennedy: Pos; Empty can: Neg; Full can: Neg. Crank test: Pos for pain, but not clicking and popping             04/21/22: Biceps assessment: Yergason's test: negative and Speed's test: negative   JOINT MOBILITY TESTING:  Medial and lateral elbow ligament stress testing negative   PALPATION:  TTP to the distal aspect and insertion of the bicep tendon               TODAY'S TREATMENT:  [facility] Adult PT Treatment:                                                DATE: 05/11/22 Therapeutic Exercise: Shoulder row GTB 2x10 Shoulder ext GTB 2x10   [facility] Adult PT Treatment:                                                DATE: 05/09/22 Therapeutic Exercise: UBE 40mns L2, 2 mins  forward and backwards each  elbow flexion 10# DB 1x10 R Hammer curl 10# DB 1 x 10 R Hammer to overhead 6# 1 x 10  Hammer to overhead 8# 1 x 10  Pronation/supination 1x10 6# DB R Pronation/supination 2 x 10 3# on wooden handle Shoulder row blue 10 x 2  Right standing chest press 10 x 2 Blue Forward raise thumb 4# 10 x 2 Lateral raise thumb up# 10 x 2   R bicep stretch on wall 2x30" Manual Therapy: Cross friction massage to R bicep (muscle belly and Musculo-tendon junction) STM/DTM/MTPR to R bicep, wrist ext,   [facility] Adult PT Treatment:                                                DATE: 05/05/22 Therapeutic Exercise: UBE 462ms L1, 2 mins forward and backwards each  Eccentric lowering elbow flexion 10# DB 2x10 R Pronation/supination 3x10 5# DB R R bicep stretch on wall 2x30" Manual Therapy: Cross friction massage to R bicep (muscle belly and Musculo-tendon junction) STM/DTM/MTPR to R bicep, wrist ext, supinator  [facility] Adult PT Treatment:                                                DATE: 05/03/22 Therapeutic Exercise: UBE 17m57m L1, 2 mins forward and backwards each  Eccentric  lowering elbow flexion 10# DB 2x10 R Pronation/supination 3x10 5# DB R R bicep stretch on wall 2x30" Manual Therapy: Cross friction massage to R bicep (muscle belly and Musculo-tendon junction) STM/DTM to R wrist ext, supinator  PATIENT EDUCATION: Education details: HEP, CFM Person educated: Patient Education method: Explanation, Demonstration, Tactile cues, and Verbal cues Education comprehension: verbalized understanding, returned demonstration, verbal cues required, and tactile cues required    HOME EXERCISE PROGRAM: Access Code: [email or web address] URL: [email or web address] Date: 04/25/2022 Prepared by: Octavio Manns  Exercises - Seated Elbow Flexion and Extension AROM  - 1 x daily - 7 x weekly - 3 sets - 10 reps - Forearm Pronation and Supination with Hammer  - 1 x daily - 7 x weekly - 3  sets - 10 reps - Standing Bicep Stretch at Wall  - 1 x daily - 7 x weekly - 2-3 reps - 30 sec hold  Access Code: [place]: [email or web address] Date: 05/11/2022 Prepared by: Gar Ponto  Exercises - Seated Elbow Flexion and Extension AROM  - 1 x daily - 7 x weekly - 3 sets - 10 reps - Forearm Pronation and Supination with Hammer  - 1 x daily - 7 x weekly - 3 sets - 10 reps - Standing Bicep Stretch at Wall  - 1 x daily - 7 x weekly - 2-3 reps - 30 sec hold - Standing Shoulder Row with Anchored Resistance  - 1 x daily - 7 x weekly - 2-3 sets - 10 reps - 2 hold - Shoulder extension with resistance - Neutral  - 1 x daily - 7 x weekly - 2-3 sets - 10 reps - 2 hold   ASSESSMENT:   CLINICAL IMPRESSION: PT was competed today for evaluation of the L shoulder. Pt appeared to be most symptomatic of impingement syndrome. MRI revealed multiple degenerative changes of the R shoulder, see above in diagnostics. L shoulder ROM and strength were found to be WNLs. An HEP was started for periscapular strengthening. Pt's presentation today was better than normal with pt having a good result after a cortisone injection.   OBJECTIVE IMPAIRMENTS increased fascial restrictions, impaired UE functional use, and pain.   ACTIVITY LIMITATIONS carrying, lifting, and reach over head   PARTICIPATION LIMITATIONS: cleaning and occupation   PERSONAL FACTORS Past/current experiences, Profession, and Time since onset of injury/illness/exacerbation are also affecting patient's functional outcome.      GOALS:   SHORT TERM GOALS = LTGs     LONG TERM GOALS: Target date: 05/26/2022    Pt will be Ind in a final HEP to maintain achieved LOF and QOL Baseline: not started Goal status: MET   2.  Pt will voice understanding of measures to assist in pain reduction  Baseline: started Goal status: MET   3.  Pt's FOTO score will improved to the predicted value of 73% as indication of improved  function Baseline: 62% Goal status: Ongoing   4.  Pt will report a decrease in R elbow pain to 4/10 or less with daily and work related activities for improved R UE function Baseline: 0-9/10 Goal status: Improving   5.  Pt will report no difficulty with lighting 10# overhead with either UE as indication of improved function with less pain Baseline: Pt reports some difficulty Goal status: Ongoing  6. Pt will report an improved pain range for the L shoulder with daily and work activities to 0-5/10.  Baseline: 2-9/10 Goal status: Initial     PLAN: PT FREQUENCY: 2x/week  PT DURATION: 4 weeks   PLANNED INTERVENTIONS: Therapeutic exercises, Therapeutic activity, Patient/Family education, Self Care, Joint mobilization, Dry Needling, Cryotherapy, Moist heat, Taping, Ultrasound, Ionotophoresis 28m/ml Dexamethasone, Manual therapy, and Re-evaluation   PLAN FOR NEXT SESSION:  Assess response to HEP; progress therex as indicated; use of modalities, manual therapy; and TPDN as indicated.   Serenitee Fuertes MS, PT 05/11/22 4:21 PM

## 2022-05-11 ENCOUNTER — Ambulatory Visit: Payer: No Typology Code available for payment source

## 2022-05-11 DIAGNOSIS — G8929 Other chronic pain: Secondary | ICD-10-CM

## 2022-05-11 DIAGNOSIS — M25521 Pain in right elbow: Secondary | ICD-10-CM

## 2022-05-16 ENCOUNTER — Encounter: Payer: Self-pay | Admitting: Physical Therapy

## 2022-05-16 ENCOUNTER — Ambulatory Visit: Payer: No Typology Code available for payment source | Admitting: Physical Therapy

## 2022-05-16 DIAGNOSIS — M25521 Pain in right elbow: Secondary | ICD-10-CM

## 2022-05-16 DIAGNOSIS — G8929 Other chronic pain: Secondary | ICD-10-CM

## 2022-05-16 NOTE — Therapy (Signed)
OUTPATIENT PHYSICAL THERAPY TREATMENT NOTE/Eval for the L Shoulder   Patient Name: Jeffrey Griffin MRN: [id number] DOB:[birth year], 61 y.o., male Today's Date: 05/16/2022  PCP: [name], DO REFERRING PROVIDER: Thurman Coyer, DO; Velora Mediate, MD  END OF SESSION:   PT End of Session - 05/16/22 0724     Visit Number 7    Number of Visits 14    Date for PT Re-Evaluation 06/17/22    Authorization Type [place] PREFERRED    PT Start Time 0722    PT Stop Time 0800    PT Time Calculation (min) 38 min                Past Medical History:  Diagnosis Date   Abnormality of thoracic aorta    4.3 CM ECTATIC ASENDING    Cardiopathy    CHF (congestive heart failure) ([place])    SYSTOLIC   Epistaxis 8/56/3149   Hyperlipidemia    Hypertension    Lung nodule    Obesity    OSA (obstructive sleep apnea)    Persistent atrial fibrillation (HCC)    WITH RVR   Pleural effusion    RIGHT UPPER LOBE AND RIGHT LOWER LOBE    Past Surgical History:  Procedure Laterality Date   APPENDECTOMY     ATRIAL FIBRILLATION ABLATION N/A 07/16/2020   Procedure: ATRIAL FIBRILLATION ABLATION;  Surgeon: [name], MD;  Location: [facility];  Service: Cardiovascular;  Laterality: N/A;   CARDIOVERSION N/A 10/20/2019   Procedure: CARDIOVERSION;  Surgeon: Acie Fredrickson Wonda Cheng, MD;  Location: [facility];  Service: Cardiovascular;  Laterality: N/A;   CARDIOVERSION N/A 05/31/2020   Procedure: CARDIOVERSION;  Surgeon: Josue Hector, MD;  Location: [facility];  Service: Cardiovascular;  Laterality: N/A;   IR ANGIOGRAM PULMONARY BILATERAL SELECTIVE  03/09/2022   IR ANGIOGRAM SELECTIVE EACH ADDITIONAL VESSEL  03/09/2022   IR ANGIOGRAM SELECTIVE EACH ADDITIONAL VESSEL  03/09/2022   IR THROMBECT PRIM MECH INIT (INCLU) MOD SED  03/09/2022   IR US GUIDE VASC ACCESS RIGHT  03/09/2022   RADIOLOGY WITH ANESTHESIA N/A 03/09/2022   Procedure: IR WITH ANESTHESIA;  Surgeon: Radiologist, Medication, MD;   Location: [facility];  Service: Radiology;  Laterality: N/A;   VASECTOMY     Patient Active Problem List   Diagnosis Date Noted   Knee pain, bilateral 03/12/2022   Pulmonary embolism ([place]) 03/09/2022   OSA (obstructive sleep apnea) 03/09/2022   PE (pulmonary thromboembolism) ([place]) 03/09/2022   S/P prostatectomy    Paroxysmal atrial fibrillation (HCC)    Fatigue 03/04/2021   Subcutaneous nodule of left foot 03/04/2021   Atypical atrial flutter ([place]) 05/26/2020   Secondary hypercoagulable state ([place]) 04/13/2020   Chronic combined systolic and diastolic CHF (congestive heart failure) ([place]) 04/06/2020   Persistent atrial fibrillation ([place]) 10/01/2019   Pulmonary embolus ([place]) 10/01/2019    REFERRING DIAG: M25.521 (ICD-10-CM) - Right elbow pain; Biceps tendonitis right; L shoulder OA and RC tendinopathy  THERAPY DIAG:  Pain in right elbow  Chronic left shoulder pain  Rationale for Evaluation and Treatment Rehabilitation  PERTINENT HISTORY: CHF, Afib  PRECAUTIONS: None  SUBJECTIVE:  Pt reports he is still doing well since the shoulder injection and has no pain on arrival. He reports that he can still feel discomfort with certain motions OH especially ith weight.   PAIN: L Shoulder Are you having pain? NO Yes: NPRS scale: Prior injection 0/10 Pain location:L shoulder Pain description: ache and sharp Aggravating factors: Raising the the  L arm, waking up with his L arm above his head, liftting weight OH Relieving factors: rest and support, ice pack, voltaren gel  PAIN: R elbow Are you having pain? 0/10 Yes: NPRS scale: 0-1/10 Pain location: R elbow Pain description: ache and sharp Aggravating factors: Elbow extension and flexion end range, certain r arm movements Relieving factors: rest and support, ice pack Pain range on eval: 0-3/10   OBJECTIVE: (objective measures completed at initial evaluation unless otherwise dated)  DIAGNOSTIC FINDINGS:   04/27/22 L shoulder  MRI FINDINGS: Rotator cuff: Moderate rotator cuff tendinopathy/tendinosis with interstitial tears and areas of bursal and articular surface fraying and fibrillation. No full thickness retracted rotator cuff tear is identified.   Muscles:  No significant findings.   Biceps long head: Intact. Mild tendinopathy involving the intra-articular portion.   Acromioclavicular Joint: Moderate degenerative changes with joint effusion. Type 1-2 acromion. No lateral downsloping or subacromial spurring.   Glenohumeral Joint: Significant age advanced degenerative changes with significant cartilage loss, joint space narrowing, osteophytic spurring and subchondral cystic change. Small joint effusion.   Labrum: The superior labrum is degenerated and torn. The anterior and posterior labrum are grossly normal.   Bones:  No acute bony findings.   Other: Mild subacromial/subdeltoid bursitis. Moderate rotator interval synovitis.   IMPRESSION: 1. Moderate rotator cuff tendinopathy/tendinosis as detailed above. No full thickness retracted rotator cuff tear. 2. Intact long head biceps tendon. Mild tendinopathy involving the intra-articular portion. 3. Significant age advanced glenohumeral joint degenerative changes. 4. Degenerated and torn superior labrum. 5. Moderate AC joint degenerative changes but no other significant findings for bony impingement. 6. Mild subacromial/subdeltoid bursitis and moderate rotator interval synovitis.    R elbow Xray 04/18/22 IMPRESSION: Negative.   PATIENT SURVEYS:  FOTO Perceived function 62%, predicted 73% FOTO 92% -FOTO DISCHARGED FOR RIGHT UE   COGNITION:           Overall cognitive status: Within functional limits for tasks assessed                                  SENSATION: WFL   POSTURE: Forward head and rounded shoulders   UPPER EXTREMITY ROM:                        WNLs Active ROM Right Eval 04/21/22 Left Eval 05/11/22  Shoulder flexion   WNLs  pain only at end range  Shoulder extension    WNLs   Shoulder abduction    WNLs pain only at end range  Shoulder adduction      Shoulder internal rotation   T9 Painful  Shoulder external rotation Pain at end range T4   Elbow flexion Pain at end range    Elbow extension      Wrist flexion      Wrist extension      Wrist ulnar deviation      Wrist radial deviation      Wrist pronation      Wrist supination Pain at end range    (Blank rows = not tested)   UPPER EXTREMITY MMT:   MMT Right Eval 04/21/22 Left Eval 05/11/22  Shoulder flexion 5  5  Shoulder extension 5  5  Shoulder abduction 5  5  Shoulder adduction 5  5  Shoulder internal rotation 5, pain  5  Shoulder external rotation 5  5  Middle trapezius  Lower trapezius      Elbow flexion 5, pain  5  Elbow extension 5  5  Wrist flexion 5, pain    Wrist extension 5    Wrist ulnar deviation 5    Wrist radial deviation 5    Wrist pronation 5    Wrist supination 5    Grip strength (lbs) TBA TBA  (Blank rows = not tested)   SHOULDER SPECIAL TESTS:  05/11/22 Hawkins-Kennedy: Pos; Empty can: Neg; Full can: Neg. Crank test: Pos for pain, but not clicking and popping             04/21/22: Biceps assessment: Yergason's test: negative and Speed's test: negative   JOINT MOBILITY TESTING:  Medial and lateral elbow ligament stress testing negative   PALPATION:  TTP to the distal aspect and insertion of the bicep tendon               TODAY'S TREATMENT:  [facility] Adult PT Treatment:                                                DATE: 05/16/22 Therapeutic Exercise: UBE L2.5 x 2 min each  Shoulder row BTB 2x10 Shoulder ext GTB 2x10 Shoulder L ER yellow band 10 x 2  Shoulder L IR yellow band 10 x 2  elbow flexion 10# DB 1x10 R Hammer curl 10# DB 1 x 10 R Hammer to overhead 8# 2 x 10 Velcro pad handle turns wrist flexion and extension Velcro pad handle turns elbow pro/sup  [facility] Adult PT Treatment:                                                 DATE: 05/11/22 Therapeutic Exercise: Shoulder row GTB 2x10 Shoulder ext GTB 2x10   [facility] Adult PT Treatment:                                                DATE: 05/09/22 Therapeutic Exercise: UBE 61mns L2, 2 mins forward and backwards each  elbow flexion 10# DB 1x10 R Hammer curl 10# DB 1 x 10 R Hammer to overhead 6# 1 x 10  Hammer to overhead 8# 1 x 10  Pronation/supination 1x10 6# DB R Pronation/supination 2 x 10 3# on wooden handle Shoulder row blue 10 x 2  Right standing chest press 10 x 2 Blue Forward raise thumb 4# 10 x 2 Lateral raise thumb up# 10 x 2   R bicep stretch on wall 2x30" Manual Therapy: Cross friction massage to R bicep (muscle belly and Musculo-tendon junction) STM/DTM/MTPR to R bicep, wrist ext,   [facility] Adult PT Treatment:                                                DATE: 05/05/22 Therapeutic Exercise: UBE 441ms L1, 2 mins forward and backwards each  Eccentric lowering elbow flexion 10# DB 2x10 R Pronation/supination 3x10 5# DB R R bicep stretch  on wall 2x30" Manual Therapy: Cross friction massage to R bicep (muscle belly and Musculo-tendon junction) STM/DTM/MTPR to R bicep, wrist ext, supinator  [facility] Adult PT Treatment:                                                DATE: 05/03/22 Therapeutic Exercise: UBE 58mns L1, 2 mins forward and backwards each  Eccentric lowering elbow flexion 10# DB 2x10 R Pronation/supination 3x10 5# DB R R bicep stretch on wall 2x30" Manual Therapy: Cross friction massage to R bicep (muscle belly and Musculo-tendon junction) STM/DTM to R wrist ext, supinator  PATIENT EDUCATION: Education details: HEP, CFM Person educated: Patient Education method: Explanation, Demonstration, Tactile cues, and Verbal cues Education comprehension: verbalized understanding, returned demonstration, verbal cues required, and tactile cues required    HOME EXERCISE PROGRAM: Access Code: [email or web address]:  [email or web address] Date: 04/25/2022 Prepared by: DOctavio Manns Exercises - Seated Elbow Flexion and Extension AROM  - 1 x daily - 7 x weekly - 3 sets - 10 reps - Forearm Pronation and Supination with Hammer  - 1 x daily - 7 x weekly - 3 sets - 10 reps - Standing Bicep Stretch at [place] - 1 x daily - 7 x weekly - 2-3 reps - 30 sec hold  Date: 05/11/2022 Added and prepared: by AGar Ponto - Standing Shoulder Row with Anchored Resistance  - 1 x daily - 7 x weekly - 2-3 sets - 10 reps - 2 hold - Shoulder extension with resistance - Neutral  - 1 x daily - 7 x weekly - 2-3 sets - 10 reps - 2 hold   ASSESSMENT:   CLINICAL IMPRESSION: PT was competed today for treatment of L shoulder and Right ELbow. Significant improvement in subjective reports for right elbow. FOTO score improved beyond prediction. He has met LTG# 3.  Session focused on progression of L shoulder strengthening and patient was issued therabands and an updated HEP for L shoulder. He had no increased pain.    OBJECTIVE IMPAIRMENTS increased fascial restrictions, impaired UE functional use, and pain.   ACTIVITY LIMITATIONS carrying, lifting, and reach over head   PARTICIPATION LIMITATIONS: cleaning and occupation   PERSONAL FACTORS Past/current experiences, Profession, and Time since onset of injury/illness/exacerbation are also affecting patient's functional outcome.      GOALS:   SHORT TERM GOALS = LTGs     LONG TERM GOALS: Target date: 05/26/2022    Pt will be Ind in a final HEP to maintain achieved LOF and QOL Baseline: not started Goal status: MET   2.  Pt will voice understanding of measures to assist in pain reduction  Baseline: started Goal status: MET   3.  Pt's FOTO score will improved to the predicted value of 73% as indication of improved function Baseline: 62% Status: 92% RUE, no survey for LUE Goal status: MET   4.  Pt will report a decrease in R elbow pain to 4/10 or less with  daily and work related activities for improved R UE function Baseline: 0-9/10 Status: 05/16/22:Pain level under 4/10 for elbow, average 0/10. Goal status: MET   5.  Pt will report no difficulty with lighting 10# overhead with either UE as indication of improved function with less pain Baseline: Pt reports some difficulty Goal status: Ongoing  6. Pt will report an improved pain  range for the L shoulder with daily and work activities to 0-5/10.  Baseline: 2-9/10 Goal status: Initial     PLAN: PT FREQUENCY: 2x/week   PT DURATION: 4 weeks   PLANNED INTERVENTIONS: Therapeutic exercises, Therapeutic activity, Patient/Family education, Self Care, Joint mobilization, Dry Needling, Cryotherapy, Moist heat, Taping, Ultrasound, Ionotophoresis 47m/ml Dexamethasone, Manual therapy, and Re-evaluation   PLAN FOR NEXT SESSION:  L Shoulder strength/functional strengthening. Maintenance of R Elbow strength- finalize HEP for Elbow, focus L shoulder.    JHessie Diener PTA 05/16/22 8:12 AM Phone: [phone]: [phone]

## 2022-05-18 ENCOUNTER — Ambulatory Visit: Payer: No Typology Code available for payment source | Admitting: Physical Therapy

## 2022-05-18 ENCOUNTER — Encounter: Payer: Self-pay | Admitting: Physical Therapy

## 2022-05-18 DIAGNOSIS — M25521 Pain in right elbow: Secondary | ICD-10-CM | POA: Diagnosis not present

## 2022-05-18 DIAGNOSIS — G8929 Other chronic pain: Secondary | ICD-10-CM

## 2022-05-18 NOTE — Therapy (Signed)
OUTPATIENT PHYSICAL THERAPY TREATMENT NOTE   Patient Name: Jeffrey Griffin MRN: [id number] DOB:Feb 04, 1961, 61 y.o., male Today's Date: 05/18/2022  PCP: [name], DO REFERRING PROVIDER: Thurman Coyer, DO; Velora Mediate, MD  END OF SESSION:   PT End of Session - 05/18/22 0717     Visit Number 8    Number of Visits 14    Date for PT Re-Evaluation 06/17/22    Authorization Type [facility] PREFERRED    PT Start Time 0715    PT Stop Time 5027    PT Time Calculation (min) 38 min                Past Medical History:  Diagnosis Date   Abnormality of thoracic aorta    4.3 CM ECTATIC ASENDING    Cardiopathy    CHF (congestive heart failure) ([place])    SYSTOLIC   Epistaxis 7/41/2878   Hyperlipidemia    Hypertension    Lung nodule    Obesity    OSA (obstructive sleep apnea)    Persistent atrial fibrillation (H[facility])    WITH RVR   Pleural effusion    RIGHT UPPER LOBE AND RIGHT LOWER LOBE    Past Surgical History:  Procedure Laterality Date   APPENDECTOMY     ATRIAL FIBRILLATION ABLATION N/A 07/16/2020   Procedure: ATRIAL FIBRILLATION ABLATION;  Surgeon: [name], MD;  Location: [facility];  Service: Cardiovascular;  Laterality: N/A;   CARDIOVERSION N/A 10/20/2019   Procedure: CARDIOVERSION;  Surgeon: Acie Fredrickson Wonda Cheng, MD;  Location: [facility];  Service: Cardiovascular;  Laterality: N/A;   CARDIOVERSION N/A 05/31/2020   Procedure: CARDIOVERSION;  Surgeon: Josue Hector, MD;  Location: [facility];  Service: Cardiovascular;  Laterality: N/A;   IR ANGIOGRAM PULMONARY BILATERAL SELECTIVE  03/09/2022   IR ANGIOGRAM SELECTIVE EACH ADDITIONAL VESSEL  03/09/2022   IR ANGIOGRAM SELECTIVE EACH ADDITIONAL VESSEL  03/09/2022   IR THROMBECT PRIM MECH INIT (INCLU) MOD SED  03/09/2022   IR US GUIDE VASC ACCESS RIGHT  03/09/2022   RADIOLOGY WITH ANESTHESIA N/A 03/09/2022   Procedure: IR WITH ANESTHESIA;  Surgeon: Radiologist, Medication, MD;  Location: [facility];  Service:  Radiology;  Laterality: N/A;   VASECTOMY     Patient Active Problem List   Diagnosis Date Noted   Knee pain, bilateral 03/12/2022   Pulmonary embolism ([facility]) 03/09/2022   OSA (obstructive sleep apnea) 03/09/2022   PE (pulmonary thromboembolism) ([facility]) 03/09/2022   S/P prostatectomy    Paroxysmal atrial fibrillation (HCC)    Fatigue 03/04/2021   Subcutaneous nodule of left foot 03/04/2021   Atypical atrial flutter ([place]) 05/26/2020   Secondary hypercoagulable state ([place]) 04/13/2020   Chronic combined systolic and diastolic CHF (congestive heart failure) ([place]) 04/06/2020   Persistent atrial fibrillation ([place]) 10/01/2019   Pulmonary embolus ([place]) 10/01/2019    REFERRING DIAG: M25.521 (ICD-10-CM) - Right elbow pain; Biceps tendonitis right; L shoulder OA and RC tendinopathy  THERAPY DIAG:  Pain in right elbow  Chronic left shoulder pain  Rationale for Evaluation and Treatment Rehabilitation  PERTINENT HISTORY: CHF, Afib  PRECAUTIONS: None  SUBJECTIVE:  Pt reports no increased soreness in shoulder after last session. He is compliant with new HEP for shoulder.    PAIN: L Shoulder Are you having pain? NO Yes: NPRS scale: Prior injection 0/10 Pain location:L shoulder Pain description: ache and sharp Aggravating factors: Raising the the L arm, waking up with his L arm above his head, liftting weight OH Relieving factors: rest  and support, ice pack, voltaren gel  PAIN: R elbow Are you having pain? 0/10 Yes: NPRS scale: 0-1/10 Pain location: R elbow Pain description: ache and sharp Aggravating factors: Elbow extension and flexion end range, certain r arm movements Relieving factors: rest and support, ice pack Pain range on eval: 0-3/10   OBJECTIVE: (objective measures completed at initial evaluation unless otherwise dated)  DIAGNOSTIC FINDINGS:   04/27/22 L shoulder MRI FINDINGS: Rotator cuff: Moderate rotator cuff tendinopathy/tendinosis with interstitial tears and  areas of bursal and articular surface fraying and fibrillation. No full thickness retracted rotator cuff tear is identified.   Muscles:  No significant findings.   Biceps long head: Intact. Mild tendinopathy involving the intra-articular portion.   Acromioclavicular Joint: Moderate degenerative changes with joint effusion. Type 1-2 acromion. No lateral downsloping or subacromial spurring.   Glenohumeral Joint: Significant age advanced degenerative changes with significant cartilage loss, joint space narrowing, osteophytic spurring and subchondral cystic change. Small joint effusion.   Labrum: The superior labrum is degenerated and torn. The anterior and posterior labrum are grossly normal.   Bones:  No acute bony findings.   Other: Mild subacromial/subdeltoid bursitis. Moderate rotator interval synovitis.   IMPRESSION: 1. Moderate rotator cuff tendinopathy/tendinosis as detailed above. No full thickness retracted rotator cuff tear. 2. Intact long head biceps tendon. Mild tendinopathy involving the intra-articular portion. 3. Significant age advanced glenohumeral joint degenerative changes. 4. Degenerated and torn superior labrum. 5. Moderate AC joint degenerative changes but no other significant findings for bony impingement. 6. Mild subacromial/subdeltoid bursitis and moderate rotator interval synovitis.    R elbow Xray 04/18/22 IMPRESSION: Negative.   PATIENT SURVEYS:  FOTO Perceived function 62%, predicted 73% FOTO 92% -FOTO DISCHARGED FOR RIGHT UE   COGNITION:           Overall cognitive status: Within functional limits for tasks assessed                                  SENSATION: WFL   POSTURE: Forward head and rounded shoulders   UPPER EXTREMITY ROM:                        WNLs Active ROM Right Eval 04/21/22 Left Eval 05/11/22  Shoulder flexion   WNLs pain only at end range  Shoulder extension    WNLs   Shoulder abduction    WNLs pain only at end range   Shoulder adduction      Shoulder internal rotation   T9 Painful  Shoulder external rotation Pain at end range T4   Elbow flexion Pain at end range    Elbow extension      Wrist flexion      Wrist extension      Wrist ulnar deviation      Wrist radial deviation      Wrist pronation      Wrist supination Pain at end range    (Blank rows = not tested)   UPPER EXTREMITY MMT:   MMT Right Eval 04/21/22 Left Eval 05/11/22 Right 05/18/22 Left 05/18/22  Shoulder flexion 5  5    Shoulder extension 5  5    Shoulder abduction 5  5    Shoulder adduction 5  5    Shoulder internal rotation 5, pain  5    Shoulder external rotation 5  5    Middle trapezius  Lower trapezius        Elbow flexion 5, pain  5    Elbow extension 5  5    Wrist flexion 5, pain      Wrist extension 5      Wrist ulnar deviation 5      Wrist radial deviation 5      Wrist pronation 5      Wrist supination 5      Grip strength (lbs) TBA TBA 120 lb 108 lb  (Blank rows = not tested)   SHOULDER SPECIAL TESTS:  05/11/22 Hawkins-Kennedy: Pos; Empty can: Neg; Full can: Neg. Crank test: Pos for pain, but not clicking and popping             04/21/22: Biceps assessment: Yergason's test: negative and Speed's test: negative   JOINT MOBILITY TESTING:  Medial and lateral elbow ligament stress testing negative   PALPATION:  TTP to the distal aspect and insertion of the bicep tendon               TODAY'S TREATMENT:  [facility] Adult PT Treatment:                                                DATE: 05/18/22 Therapeutic Exercise: UBE L2.5 x 2 min each  Shoulder row Cables 20#  2x10 Shoulder L ER red band 10 x 2  Shoulder L IR red band 10 x 2 Shoulder ext Cables 20# 2x10 L shoulder chest press 10# cable 2 x 10  L shoulder flexion 3# Supine  L shoulder clocks at 90 degrees flexion S/L Left shoulder abduction AROM x 10 S/L Left shoulder abduction 3# 10 x 2  S/L Left shoulder 3# 10 x 2  S/L Left shoulder Flexion 3# 10 x 2   Seated L OH press 3#  R Hammer to overhead 10# 2 x 10 R Bicep Curl 15# 10 x 2  Pronation/supination 2x10 6# DB R   [facility] Adult PT Treatment:                                                DATE: 05/16/22 Therapeutic Exercise: UBE L2.5 x 2 min each  Shoulder row BTB 2x10 Shoulder ext GTB 2x10 Shoulder L ER yellow band 10 x 2  Shoulder L IR yellow band 10 x 2  elbow flexion 10# DB 1x10 R Hammer curl 10# DB 1 x 10 R Hammer to overhead 8# 2 x 10 Velcro pad handle turns wrist flexion and extension Velcro pad handle turns elbow pro/sup  [facility] Adult PT Treatment:                                                DATE: 05/11/22 Therapeutic Exercise: Shoulder row GTB 2x10 Shoulder ext GTB 2x10   [facility] Adult PT Treatment:  DATE: 05/09/22 Therapeutic Exercise: UBE 48mns L2, 2 mins forward and backwards each  elbow flexion 10# DB 1x10 R Hammer curl 10# DB 1 x 10 R Hammer to overhead 6# 1 x 10  Hammer to overhead 8# 1 x 10  Pronation/supination 1x10 6# DB R Pronation/supination 2 x 10 3# on wooden handle Shoulder row blue 10 x 2  Right standing chest press 10 x 2 Blue Forward raise thumb 4# 10 x 2 Lateral raise thumb up# 10 x 2   R bicep stretch on wall 2x30" Manual Therapy: Cross friction massage to R bicep (muscle belly and Musculo-tendon junction) STM/DTM/MTPR to R bicep, wrist ext,   [facility] Adult PT Treatment:                                                DATE: 05/05/22 Therapeutic Exercise: UBE 422ms L1, 2 mins forward and backwards each  Eccentric lowering elbow flexion 10# DB 2x10 R Pronation/supination 3x10 5# DB R R bicep stretch on wall 2x30" Manual Therapy: Cross friction massage to R bicep (muscle belly and Musculo-tendon junction) STM/DTM/MTPR to R bicep, wrist ext, supinator  [facility] Adult PT Treatment:                                                DATE: 05/03/22 Therapeutic Exercise: UBE 23m35m L1, 2 mins forward and backwards  each  Eccentric lowering elbow flexion 10# DB 2x10 R Pronation/supination 3x10 5# DB R R bicep stretch on wall 2x30" Manual Therapy: Cross friction massage to R bicep (muscle belly and Musculo-tendon junction) STM/DTM to R wrist ext, supinator  PATIENT EDUCATION: Education details: HEP, CFM Person educated: Patient Education method: Explanation, Demonstration, Tactile cues, and Verbal cues Education comprehension: verbalized understanding, returned demonstration, verbal cues required, and tactile cues required    HOME EXERCISE PROGRAM: Access Code: [email or web address]: [email or web address] Date: 04/25/2022 Prepared by: DavOctavio Mannsxercises - Seated Elbow Flexion and Extension AROM  - 1 x daily - 7 x weekly - 3 sets - 10 reps - Forearm Pronation and Supination with Hammer  - 1 x daily - 7 x weekly - 3 sets - 10 reps - Standing Bicep Stretch at [place] 1 x daily - 7 x weekly - 2-3 reps - 30 sec hold  Date: 05/11/2022 Added and prepared: by AllGar Ponto Standing Shoulder Row with Anchored Resistance  - 1 x daily - 7 x weekly - 2-3 sets - 10 reps - 2 hold - Shoulder extension with resistance - Neutral  - 1 x daily - 7 x weekly - 2-3 sets - 10 reps - 2 hold -added 05/18/22 - Sidelying Shoulder Abduction Palm Forward  - 1 x daily - 7 x weekly - 2-3 sets - 10 reps - Sidelying Shoulder ER with Towel and Dumbbell  - 1 x daily - 7 x weekly - 2-3 sets - 10 reps   ASSESSMENT:   CLINICAL IMPRESSION: PT was completed today for treatment of L shoulder and Right ELbow. He is doing well with new HEP and tolerated progression of L shoulder strengthening without pain today. Updated HEP with sidelying shoulder strength. He has one appointment next week and then might be out  of town for training which will last 3 weeks.    OBJECTIVE IMPAIRMENTS increased fascial restrictions, impaired UE functional use, and pain.   ACTIVITY LIMITATIONS carrying, lifting, and reach over head    PARTICIPATION LIMITATIONS: cleaning and occupation   PERSONAL FACTORS Past/current experiences, Profession, and Time since onset of injury/illness/exacerbation are also affecting patient's functional outcome.      GOALS:   SHORT TERM GOALS = LTGs     LONG TERM GOALS: Target date: 05/26/2022    Pt will be Ind in a final HEP to maintain achieved LOF and QOL Baseline: not started Goal status: MET   2.  Pt will voice understanding of measures to assist in pain reduction  Baseline: started Goal status: MET   3.  Pt's FOTO score will improved to the predicted value of 73% as indication of improved function Baseline: 62% Status: 92% RUE, no survey for LUE Goal status: MET   4.  Pt will report a decrease in R elbow pain to 4/10 or less with daily and work related activities for improved R UE function Baseline: 0-9/10 Status: 05/16/22:Pain level under 4/10 for elbow, average 0/10. Goal status: MET   5.  Pt will report no difficulty with lighting 10# overhead with either UE as indication of improved function with less pain Baseline: Pt reports some difficulty Goal status: Ongoing  6. Pt will report an improved pain range for the L shoulder with daily and work activities to 0-5/10.  Baseline: 2-9/10 Goal status: Initial     PLAN: PT FREQUENCY: 2x/week   PT DURATION: 4 weeks   PLANNED INTERVENTIONS: Therapeutic exercises, Therapeutic activity, Patient/Family education, Self Care, Joint mobilization, Dry Needling, Cryotherapy, Moist heat, Taping, Ultrasound, Ionotophoresis 54m/ml Dexamethasone, Manual therapy, and Re-evaluation   PLAN FOR NEXT SESSION:  L Shoulder strength/functional strengthening. Maintenance of R Elbow strength- finalize HEP for Elbow, focus L shoulder.    JHessie Diener PTA 05/18/22 8:06 AM Phone: [phone]: [phone]

## 2022-05-24 ENCOUNTER — Telehealth: Payer: Self-pay | Admitting: Podiatry

## 2022-05-24 ENCOUNTER — Encounter: Payer: Self-pay | Admitting: Podiatry

## 2022-05-24 ENCOUNTER — Ambulatory Visit: Payer: No Typology Code available for payment source

## 2022-05-24 DIAGNOSIS — M25521 Pain in right elbow: Secondary | ICD-10-CM | POA: Diagnosis not present

## 2022-05-24 DIAGNOSIS — G8929 Other chronic pain: Secondary | ICD-10-CM

## 2022-05-24 NOTE — Telephone Encounter (Signed)
Continue soaking and should be fine. If it continues he can send picture and I can decide on antibiotic

## 2022-05-24 NOTE — Therapy (Signed)
OUTPATIENT PHYSICAL THERAPY TREATMENT NOTE   Patient Name: Jeffrey Griffin MRN: [id number] DOB:August 15, 1961, 61 y.o., male Today's Date: 05/24/2022  PCP: [name], DO REFERRING PROVIDER: Thurman Coyer, DO; Velora Mediate, MD  END OF SESSION:   PT End of Session - 05/24/22 0720     Visit Number 9    Number of Visits 14    Date for PT Re-Evaluation 06/17/22    Authorization Type [place] PREFERRED    PT Start Time 0718    PT Stop Time 0800    PT Time Calculation (min) 42 min    Activity Tolerance Patient tolerated treatment well    Behavior During Therapy WFL for tasks assessed/performed                 Past Medical History:  Diagnosis Date   Abnormality of thoracic aorta    4.3 CM ECTATIC ASENDING    Cardiopathy    CHF (congestive heart failure) ([place])    SYSTOLIC   Epistaxis 12/12/7351   Hyperlipidemia    Hypertension    Lung nodule    Obesity    OSA (obstructive sleep apnea)    Persistent atrial fibrillation ([place])    WITH RVR   Pleural effusion    RIGHT UPPER LOBE AND RIGHT LOWER LOBE    Past Surgical History:  Procedure Laterality Date   APPENDECTOMY     ATRIAL FIBRILLATION ABLATION N/A 07/16/2020   Procedure: ATRIAL FIBRILLATION ABLATION;  Surgeon: [name], MD;  Location: [facility];  Service: Cardiovascular;  Laterality: N/A;   CARDIOVERSION N/A 10/20/2019   Procedure: CARDIOVERSION;  Surgeon: Acie Fredrickson Wonda Cheng, MD;  Location: [facility];  Service: Cardiovascular;  Laterality: N/A;   CARDIOVERSION N/A 05/31/2020   Procedure: CARDIOVERSION;  Surgeon: Josue Hector, MD;  Location: [facility];  Service: Cardiovascular;  Laterality: N/A;   IR ANGIOGRAM PULMONARY BILATERAL SELECTIVE  03/09/2022   IR ANGIOGRAM SELECTIVE EACH ADDITIONAL VESSEL  03/09/2022   IR ANGIOGRAM SELECTIVE EACH ADDITIONAL VESSEL  03/09/2022   IR THROMBECT PRIM MECH INIT (INCLU) MOD SED  03/09/2022   IR US GUIDE VASC ACCESS RIGHT  03/09/2022   RADIOLOGY WITH ANESTHESIA  N/A 03/09/2022   Procedure: IR WITH ANESTHESIA;  Surgeon: Radiologist, Medication, MD;  Location: [facility];  Service: Radiology;  Laterality: N/A;   VASECTOMY     Patient Active Problem List   Diagnosis Date Noted   Knee pain, bilateral 03/12/2022   Pulmonary embolism ([place]) 03/09/2022   OSA (obstructive sleep apnea) 03/09/2022   PE (pulmonary thromboembolism) ([place]) 03/09/2022   S/P prostatectomy    Paroxysmal atrial fibrillation ([facility])    Fatigue 03/04/2021   Subcutaneous nodule of left foot 03/04/2021   Atypical atrial flutter ([place]) 05/26/2020   Secondary hypercoagulable state ([place]) 04/13/2020   Chronic combined systolic and diastolic CHF (congestive heart failure) ([place]) 04/06/2020   Persistent atrial fibrillation ([place]) 10/01/2019   Pulmonary embolus ([place]) 10/01/2019    REFERRING DIAG: M25.521 (ICD-10-CM) - Right elbow pain; Biceps tendonitis right; L shoulder OA and RC tendinopathy  THERAPY DIAG:  Pain in right elbow  Chronic left shoulder pain  Rationale for Evaluation and Treatment Rehabilitation  PERTINENT HISTORY: CHF, Afib  PRECAUTIONS: None  SUBJECTIVE:  Pt reports he completed weed eating for 4 hours this weekend without increase in R elbow or L shoulder pain  PAIN: L Shoulder Are you having pain? NO Yes: NPRS scale: Prior injection  2+ weeks ago 0/10 Pain location:L shoulder Pain description: ache  and sharp Aggravating factors: Raising the the L arm, waking up with his L arm above his head, liftting weight OH Relieving factors: rest and support, ice pack, voltaren gel  PAIN: R elbow Are you having pain? 0/10 Yes: NPRS scale: 0-1/10 Pain location: R elbow Pain description: ache and sharp Aggravating factors: Elbow extension and flexion end range, certain r arm movements Relieving factors: rest and support, ice pack Pain range on eval: 0-3/10   OBJECTIVE: (objective measures completed at initial evaluation unless otherwise dated)  DIAGNOSTIC FINDINGS:    04/27/22 L shoulder MRI FINDINGS: Rotator cuff: Moderate rotator cuff tendinopathy/tendinosis with interstitial tears and areas of bursal and articular surface fraying and fibrillation. No full thickness retracted rotator cuff tear is identified.   Muscles:  No significant findings.   Biceps long head: Intact. Mild tendinopathy involving the intra-articular portion.   Acromioclavicular Joint: Moderate degenerative changes with joint effusion. Type 1-2 acromion. No lateral downsloping or subacromial spurring.   Glenohumeral Joint: Significant age advanced degenerative changes with significant cartilage loss, joint space narrowing, osteophytic spurring and subchondral cystic change. Small joint effusion.   Labrum: The superior labrum is degenerated and torn. The anterior and posterior labrum are grossly normal.   Bones:  No acute bony findings.   Other: Mild subacromial/subdeltoid bursitis. Moderate rotator interval synovitis.   IMPRESSION: 1. Moderate rotator cuff tendinopathy/tendinosis as detailed above. No full thickness retracted rotator cuff tear. 2. Intact long head biceps tendon. Mild tendinopathy involving the intra-articular portion. 3. Significant age advanced glenohumeral joint degenerative changes. 4. Degenerated and torn superior labrum. 5. Moderate AC joint degenerative changes but no other significant findings for bony impingement. 6. Mild subacromial/subdeltoid bursitis and moderate rotator interval synovitis.    R elbow Xray 04/18/22 IMPRESSION: Negative.   PATIENT SURVEYS:  FOTO Perceived function 62%, predicted 73% FOTO 92% -FOTO DISCHARGED FOR RIGHT UE   COGNITION:           Overall cognitive status: Within functional limits for tasks assessed                                  SENSATION: WFL   POSTURE: Forward head and rounded shoulders   UPPER EXTREMITY ROM:                        WNLs Active ROM Right Eval 04/21/22 Left Eval 05/11/22   Shoulder flexion   WNLs pain only at end range  Shoulder extension    WNLs   Shoulder abduction    WNLs pain only at end range  Shoulder adduction      Shoulder internal rotation   T9 Painful  Shoulder external rotation Pain at end range T4   Elbow flexion Pain at end range    Elbow extension      Wrist flexion      Wrist extension      Wrist ulnar deviation      Wrist radial deviation      Wrist pronation      Wrist supination Pain at end range    (Blank rows = not tested)   UPPER EXTREMITY MMT:   MMT Right Eval 04/21/22 Left Eval 05/11/22 Right 05/18/22 Left 05/18/22  Shoulder flexion 5  5    Shoulder extension 5  5    Shoulder abduction 5  5    Shoulder adduction 5  5    Shoulder internal  rotation 5, pain  5    Shoulder external rotation 5  5    Middle trapezius        Lower trapezius        Elbow flexion 5, pain  5    Elbow extension 5  5    Wrist flexion 5, pain      Wrist extension 5      Wrist ulnar deviation 5      Wrist radial deviation 5      Wrist pronation 5      Wrist supination 5      Grip strength (lbs) TBA TBA 120 lb 108 lb  (Blank rows = not tested)   SHOULDER SPECIAL TESTS:  05/11/22 Hawkins-Kennedy: Pos; Empty can: Neg; Full can: Neg. Crank test: Pos for pain, but not clicking and popping             04/21/22: Biceps assessment: Yergason's test: negative and Speed's test: negative   JOINT MOBILITY TESTING:  Medial and lateral elbow ligament stress testing negative   PALPATION:  TTP to the distal aspect and insertion of the bicep tendon  TODAY'S TREATMENT:  [facility] Adult PT Treatment:                                                DATE: 05/24/22 Therapeutic Exercise: UBE L2.5 x 2 min each  Shoulder row Cables 20#  2x10 Shoulder L ER red band 10 x 2  Shoulder L IR red band 10 x 2 Shoulder ext Cables 20# 2x10 Shoulder chest press 10# cable 2 x 10  Shoulder flexion 3# standing L shoulder clocks at 90 degrees flexion x2 5# S/L Left shoulder  abduction 3# 10 x 2  Seated L OH press 5#  2x10 R Hammer to overhead 10# 2 x 10 Bilat Bicep Curl 15# 10 x 2  Pronation/supination 2x10 6# DB R  [facility] Adult PT Treatment:                                                DATE: 05/18/22 Therapeutic Exercise: UBE L2.5 x 2 min each  Shoulder row Cables 20#  2x10 Shoulder L ER red band 10 x 2  Shoulder L IR red band 10 x 2 Shoulder ext Cables 20# 2x10 L shoulder chest press 10# cable 2 x 10  L shoulder flexion 3# Supine  L shoulder clocks at 90 degrees flexion S/L Left shoulder abduction AROM x 10 S/L Left shoulder abduction 3# 10 x 2  S/L Left shoulder 3# 10 x 2  S/L Left shoulder Flexion 3# 10 x 2  Seated L OH press 3#  R Hammer to overhead 10# 2 x 10 R Bicep Curl 15# 10 x 2  Pronation/supination 2x10 6# DB R   [facility] Adult PT Treatment:                                                DATE: 05/16/22 Therapeutic Exercise: UBE L2.5 x 2 min each  Shoulder row BTB 2x10 Shoulder ext GTB 2x10 Shoulder L ER yellow band 10  x 2  Shoulder L IR yellow band 10 x 2  elbow flexion 10# DB 1x10 R Hammer curl 10# DB 1 x 10 R Hammer to overhead 8# 2 x 10 Velcro pad handle turns wrist flexion and extension Velcro pad handle turns elbow pro/su   PATIENT EDUCATION: Education details: HEP, CFM Person educated: Patient Education method: Explanation, Demonstration, Tactile cues, and Verbal cues Education comprehension: verbalized understanding, returned demonstration, verbal cues required, and tactile cues required    HOME EXERCISE PROGRAM: Access Code: [email or web address] URL: [email or web address] Date: 04/25/2022 Prepared by: Octavio Manns  Exercises - Seated Elbow Flexion and Extension AROM  - 1 x daily - 7 x weekly - 3 sets - 10 reps - Forearm Pronation and Supination with Hammer  - 1 x daily - 7 x weekly - 3 sets - 10 reps - Standing Bicep Stretch at [place]  - 1 x daily - 7 x weekly - 2-3 reps - 30 sec hold  Date: 05/11/2022 Added and  prepared: by Gar Ponto  - Standing Shoulder Row with Anchored Resistance  - 1 x daily - 7 x weekly - 2-3 sets - 10 reps - 2 hold - Shoulder extension with resistance - Neutral  - 1 x daily - 7 x weekly - 2-3 sets - 10 reps - 2 hold -added 05/18/22 - Sidelying Shoulder Abduction Palm Forward  - 1 x daily - 7 x weekly - 2-3 sets - 10 reps - Sidelying Shoulder ER with Towel and Dumbbell  - 1 x daily - 7 x weekly - 2-3 sets - 10 reps   ASSESSMENT:   CLINICAL IMPRESSION: Pt participated in PT for L shoulder and r elgow strengthening. Pt tolerated a high demand activity of weed eating his yard for 4 hours with increase in pain. Pt tolerated PT today without adverse effets. Pt will be undergoing job training OOT for 3 weeks and is return to PT after that time frame. Will assess status and continue PT as indicated. Pt is progressing very well with his L shoulder and R elbow pain and function .  OBJECTIVE IMPAIRMENTS increased fascial restrictions, impaired UE functional use, and pain.   ACTIVITY LIMITATIONS carrying, lifting, and reach over head   PARTICIPATION LIMITATIONS: cleaning and occupation   PERSONAL FACTORS Past/current experiences, Profession, and Time since onset of injury/illness/exacerbation are also affecting patient's functional outcome.      GOALS:   SHORT TERM GOALS = LTGs     LONG TERM GOALS: Target date: 05/26/2022    Pt will be Ind in a final HEP to maintain achieved LOF and QOL Baseline: not started Goal status: MET   2.  Pt will voice understanding of measures to assist in pain reduction  Baseline: started Goal status: MET   3.  Pt's FOTO score will improved to the predicted value of 73% as indication of improved function Baseline: 62% Status: 92% RUE, no survey for LUE Goal status: MET   4.  Pt will report a decrease in R elbow pain to 4/10 or less with daily and work related activities for improved R UE function Baseline: 0-9/10 Status: 05/16/22:Pain level  under 4/10 for elbow, average 0/10. Goal status: MET   5.  Pt will report no difficulty with lighting 10# overhead with either UE as indication of improved function with less pain Baseline: Pt reports some difficulty Goal status: Ongoing  6. Pt will report an improved pain range for the L shoulder with daily and work activities to 0-5/10.  Baseline: 2-9/10 Goal status: Initial     PLAN: PT FREQUENCY: 2x/week   PT DURATION: 4 weeks   PLANNED INTERVENTIONS: Therapeutic exercises, Therapeutic activity, Patient/Family education, Self Care, Joint mobilization, Dry Needling, Cryotherapy, Moist heat, Taping, Ultrasound, Ionotophoresis 74m/ml Dexamethasone, Manual therapy, and Re-evaluation   PLAN FOR NEXT SESSION:  L Shoulder strength/functional strengthening. Maintenance of R Elbow strength- finalize HEP for Elbow, focus L shoulder.    Austen Wygant MS, PT 05/24/22 8:18 AM

## 2022-05-24 NOTE — Telephone Encounter (Signed)
Patient stopped in office , toe is red and inflamed where nail was removed.  Offered pt appointment for later today , he cant come in because of his school schedule.  Please advise on what he should do , patient said you can call or send message through [place].

## 2022-05-25 NOTE — Telephone Encounter (Signed)
Patient said that toe is not oozing or tender, but it is red and inflamed . The whole toe is now numb.

## 2022-05-25 NOTE — Telephone Encounter (Signed)
Normal to be red. Should resolve over time

## 2022-05-26 NOTE — Telephone Encounter (Signed)
Spoke with patient he is going to send pic this afternoon just so you can see it and he will keep you updated through my chart because he is going to [place] for training.

## 2022-05-30 ENCOUNTER — Other Ambulatory Visit: Payer: No Typology Code available for payment source

## 2022-05-30 NOTE — Telephone Encounter (Signed)
Please get this patient scheduled sooner with Dr. Paulla Dolly

## 2022-05-30 NOTE — Telephone Encounter (Signed)
Patient toe is oozing,tender

## 2022-06-06 ENCOUNTER — Ambulatory Visit: Payer: No Typology Code available for payment source | Admitting: Family Medicine

## 2022-06-12 ENCOUNTER — Ambulatory Visit: Payer: No Typology Code available for payment source | Admitting: Podiatry

## 2022-06-12 ENCOUNTER — Other Ambulatory Visit: Payer: No Typology Code available for payment source

## 2022-06-13 ENCOUNTER — Encounter: Payer: No Typology Code available for payment source | Admitting: Physical Therapy

## 2022-06-15 ENCOUNTER — Ambulatory Visit: Payer: No Typology Code available for payment source | Admitting: Physical Therapy

## 2022-06-17 ENCOUNTER — Other Ambulatory Visit: Payer: Self-pay | Admitting: Internal Medicine

## 2022-06-19 NOTE — Telephone Encounter (Signed)
Prescription refill request for Eliquis received. Indication:Afib Last office visit:7/23 Scr:0.7 Age: 61 Weight:106.4 kg  Prescription refilled

## 2022-06-20 ENCOUNTER — Other Ambulatory Visit: Payer: No Typology Code available for payment source

## 2022-06-20 ENCOUNTER — Other Ambulatory Visit: Payer: Self-pay | Admitting: Family Medicine

## 2022-06-20 ENCOUNTER — Encounter: Payer: Self-pay | Admitting: Family Medicine

## 2022-06-20 DIAGNOSIS — Z9079 Acquired absence of other genital organ(s): Secondary | ICD-10-CM

## 2022-06-20 NOTE — Therapy (Signed)
OUTPATIENT PHYSICAL THERAPY TREATMENT NOTE/ Re-Cert/Progress Note   Patient Name: Jeffrey Griffin MRN: [id number] DOB:06-19-1961, 61 y.o., male Today's Date: 06/21/2022  PCP: [name], DO REFERRING PROVIDER: Thurman Coyer, DO; Velora Mediate, MD  Progress Note Reporting Period 04/21/22 to 06/21/22  See note below for Objective Data and Assessment of Progress/Goals.      END OF SESSION:   PT End of Session - 06/21/22 0726     Visit Number 10    Number of Visits 14    Date for PT Re-Evaluation 08/11/22    Authorization Type [place] PREFERRED    PT Start Time 0718    PT Stop Time 0800    PT Time Calculation (min) 42 min    Activity Tolerance Patient tolerated treatment well    Behavior During Therapy WFL for tasks assessed/performed                  Past Medical History:  Diagnosis Date   Abnormality of thoracic aorta    4.3 CM ECTATIC ASENDING    Cardiopathy    CHF (congestive heart failure) ([place])    SYSTOLIC   Epistaxis 3/61/4431   Hyperlipidemia    Hypertension    Lung nodule    Obesity    OSA (obstructive sleep apnea)    Persistent atrial fibrillation (HCC)    WITH RVR   Pleural effusion    RIGHT UPPER LOBE AND RIGHT LOWER LOBE    Past Surgical History:  Procedure Laterality Date   APPENDECTOMY     ATRIAL FIBRILLATION ABLATION N/A 07/16/2020   Procedure: ATRIAL FIBRILLATION ABLATION;  Surgeon: [name], MD;  Location: [facility];  Service: Cardiovascular;  Laterality: N/A;   CARDIOVERSION N/A 10/20/2019   Procedure: CARDIOVERSION;  Surgeon: Acie Fredrickson Wonda Cheng, MD;  Location: [facility];  Service: Cardiovascular;  Laterality: N/A;   CARDIOVERSION N/A 05/31/2020   Procedure: CARDIOVERSION;  Surgeon: Josue Hector, MD;  Location: [facility];  Service: Cardiovascular;  Laterality: N/A;   IR ANGIOGRAM PULMONARY BILATERAL SELECTIVE  03/09/2022   IR ANGIOGRAM SELECTIVE EACH ADDITIONAL VESSEL  03/09/2022   IR ANGIOGRAM SELECTIVE  EACH ADDITIONAL VESSEL  03/09/2022   IR THROMBECT PRIM MECH INIT (INCLU) MOD SED  03/09/2022   IR US GUIDE VASC ACCESS RIGHT  03/09/2022   RADIOLOGY WITH ANESTHESIA N/A 03/09/2022   Procedure: IR WITH ANESTHESIA;  Surgeon: Radiologist, Medication, MD;  Location: [facility];  Service: Radiology;  Laterality: N/A;   VASECTOMY     Patient Active Problem List   Diagnosis Date Noted   Knee pain, bilateral 03/12/2022   Pulmonary embolism ([place]) 03/09/2022   OSA (obstructive sleep apnea) 03/09/2022   PE (pulmonary thromboembolism) ([place]) 03/09/2022   S/P prostatectomy    Paroxysmal atrial fibrillation (HCC)    Fatigue 03/04/2021   Subcutaneous nodule of left foot 03/04/2021   Atypical atrial flutter ([place]) 05/26/2020   Secondary hypercoagulable state ([place]) 04/13/2020   Chronic combined systolic and diastolic CHF (congestive heart failure) ([place]) 04/06/2020   Persistent atrial fibrillation ([place]) 10/01/2019   Pulmonary embolus ([place]) 10/01/2019    REFERRING DIAG: M25.521 (ICD-10-CM) - Right elbow pain; Biceps tendonitis right; L shoulder OA and RC tendinopathy  THERAPY DIAG:  Pain in right elbow  Chronic left shoulder pain  Rationale for Evaluation and Treatment Rehabilitation  PERTINENT HISTORY: CHF, Afib  PRECAUTIONS: None  SUBJECTIVE:  Pt reports he is overall doing better with his R elbow and L shoulder. He reports R elbow stiff  and L shoulder pain near end range of abduction. With being OOT for training, he notes decreased time with completing his HEP. He reports starting his new job last week.  PAIN: L Shoulder Are you having pain?  4/10, end range/pain arc.otherwise 0/10 Yes: NPRS scale: Prior injection  2+ weeks ago 0/10 Pain location:L shoulder Pain description: ache and sharp Aggravating factors: Raising the the L arm, waking up with his L arm above his head, liftting weight OH Relieving factors: rest and support, ice pack, voltaren gel  PAIN: R elbow Are you having pain? 1-2/10  c  resisted flexion other wise 0/10 Yes: NPRS scale: 0-1/10 L shoulder abd Pain location: R elbow Pain description: ache and sharp Aggravating factors: Elbow extension and flexion end range, certain r arm movements Relieving factors: rest and support, ice pack Pain range on eval: 0-3/10   OBJECTIVE: (objective measures completed at initial evaluation unless otherwise dated)  DIAGNOSTIC FINDINGS:   04/27/22 L shoulder MRI FINDINGS: Rotator cuff: Moderate rotator cuff tendinopathy/tendinosis with interstitial tears and areas of bursal and articular surface fraying and fibrillation. No full thickness retracted rotator cuff tear is identified.   Muscles:  No significant findings.   Biceps long head: Intact. Mild tendinopathy involving the intra-articular portion.   Acromioclavicular Joint: Moderate degenerative changes with joint effusion. Type 1-2 acromion. No lateral downsloping or subacromial spurring.   Glenohumeral Joint: Significant age advanced degenerative changes with significant cartilage loss, joint space narrowing, osteophytic spurring and subchondral cystic change. Small joint effusion.   Labrum: The superior labrum is degenerated and torn. The anterior and posterior labrum are grossly normal.   Bones:  No acute bony findings.   Other: Mild subacromial/subdeltoid bursitis. Moderate rotator interval synovitis.   IMPRESSION: 1. Moderate rotator cuff tendinopathy/tendinosis as detailed above. No full thickness retracted rotator cuff tear. 2. Intact long head biceps tendon. Mild tendinopathy involving the intra-articular portion. 3. Significant age advanced glenohumeral joint degenerative changes. 4. Degenerated and torn superior labrum. 5. Moderate AC joint degenerative changes but no other significant findings for bony impingement. 6. Mild subacromial/subdeltoid bursitis and moderate rotator interval synovitis.    R elbow Xray 04/18/22 IMPRESSION: Negative.    PATIENT SURVEYS:  FOTO Perceived function 62%, predicted 73% FOTO 92% -FOTO DISCHARGED FOR RIGHT UE   COGNITION:           Overall cognitive status: Within functional limits for tasks assessed                                  SENSATION: WFL   POSTURE: Forward head and rounded shoulders   UPPER EXTREMITY ROM:                        WNLs Active ROM Right Eval 04/21/22 Left Eval 05/11/22  Shoulder flexion   WNLs pain only at end range  Shoulder extension    WNLs   Shoulder abduction    WNLs pain only at end range  Shoulder adduction      Shoulder internal rotation   T9 Painful  Shoulder external rotation Pain at end range T4   Elbow flexion Pain at end range    Elbow extension      Wrist flexion      Wrist extension      Wrist ulnar deviation      Wrist radial deviation      Wrist pronation  Wrist supination Pain at end range    (Blank rows = not tested)   UPPER EXTREMITY MMT:   MMT Right Eval 04/21/22 Left Eval 05/11/22 Right 05/18/22 Left 05/18/22  Shoulder flexion 5  5    Shoulder extension 5  5    Shoulder abduction 5  5    Shoulder adduction 5  5    Shoulder internal rotation 5, pain  5    Shoulder external rotation 5  5    Middle trapezius        Lower trapezius        Elbow flexion 5, pain  5    Elbow extension 5  5    Wrist flexion 5, pain      Wrist extension 5      Wrist ulnar deviation 5      Wrist radial deviation 5      Wrist pronation 5      Wrist supination 5      Grip strength (lbs) TBA TBA 120 lb 108 lb  (Blank rows = not tested)   SHOULDER SPECIAL TESTS:  05/11/22 Hawkins-Kennedy: Pos; Empty can: Neg; Full can: Neg. Crank test: Pos for pain, but not clicking and popping             04/21/22: Biceps assessment: Yergason's test: negative and Speed's test: negative   JOINT MOBILITY TESTING:  Medial and lateral elbow ligament stress testing negative   PALPATION:  TTP to the distal aspect and insertion of the bicep tendon  TODAY'S TREATMENT:   [facility] Adult PT Treatment:                                                DATE: 06/21/22 Therapeutic Exercise: UBE L2.5 x 2 min each  Shoulder row Cables 17#  2x10 Shoulder L ER red band 10 x 2  Shoulder L IR red band 10 x 2 Shoulder ext Cables 17# 2x10 Shoulder chest press 10# cable 2 x 10  Shoulder flexion 3# standing to 90d Shoulder scaption 3# standing to 90d R Hammer to overhead 8# 2 x 10 Bilat Bicep Curl 10# 10 x 2  Pronation/supination 2x10 6# DB   [facility] Adult PT Treatment:                                                DATE: 05/24/22 Therapeutic Exercise: UBE L2.5 x 2 min each  Shoulder row Cables 20#  2x10 Shoulder L ER red band 10 x 2  Shoulder L IR red band 10 x 2 Shoulder ext Cables 20# 2x10 Shoulder chest press 10# cable 2 x 10  Shoulder flexion 3# standing L shoulder clocks at 90 degrees flexion x2 5# S/L Left shoulder abduction 3# 10 x 2  Seated L OH press 5#  2x10 R Hammer to overhead 10# 2 x 10 Bilat Bicep Curl 15# 10 x 2  Pronation/supination 2x10 6# DB R  [facility] Adult PT Treatment:  DATE: 05/18/22 Therapeutic Exercise: UBE L2.5 x 2 min each  Shoulder row Cables 20#  2x10 Shoulder L ER red band 10 x 2  Shoulder L IR red band 10 x 2 Shoulder ext Cables 20# 2x10 L shoulder chest press 10# cable 2 x 10  L shoulder flexion 3# Supine  L shoulder clocks at 90 degrees flexion S/L Left shoulder abduction AROM x 10 S/L Left shoulder abduction 3# 10 x 2  S/L Left shoulder 3# 10 x 2  S/L Left shoulder Flexion 3# 10 x 2  Seated L OH press 3#  R Hammer to overhead 10# 2 x 10 R Bicep Curl 15# 10 x 2  Pronation/supination 2x10 6# DB R   PATIENT EDUCATION: Education details: HEP, CFM Person educated: Patient Education method: Explanation, Demonstration, Tactile cues, and Verbal cues Education comprehension: verbalized understanding, returned demonstration, verbal cues required, and tactile cues required    HOME  EXERCISE PROGRAM: Access Code: [email or web address] URL: [email or web address] Date: 04/25/2022 Prepared by: Octavio Manns  Exercises - Seated Elbow Flexion and Extension AROM  - 1 x daily - 7 x weekly - 3 sets - 10 reps - Forearm Pronation and Supination with Hammer  - 1 x daily - 7 x weekly - 3 sets - 10 reps - Standing Bicep Stretch at [place]  - 1 x daily - 7 x weekly - 2-3 reps - 30 sec hold  Date: 05/11/2022 Added and prepared: by Gar Ponto  - Standing Shoulder Row with Anchored Resistance  - 1 x daily - 7 x weekly - 2-3 sets - 10 reps - 2 hold - Shoulder extension with resistance - Neutral  - 1 x daily - 7 x weekly - 2-3 sets - 10 reps - 2 hold -added 05/18/22 - Sidelying Shoulder Abduction Palm Forward  - 1 x daily - 7 x weekly - 2-3 sets - 10 reps - Sidelying Shoulder ER with Towel and Dumbbell  - 1 x daily - 7 x weekly - 2-3 sets - 10 reps   ASSESSMENT:   CLINICAL IMPRESSION: Pt returns to PT following a month of being OOT for training for a new job and then starting his normal duties 1 week ago. Overall, pt's pain for his R elbow and L shoulder are improved, but issues are still present, ie, the R elbow with resisted flexion and the L shoulder with abd near end range/painful arc. With time away from PT, strengthening therex today was completed with less demand. Pt tolerated therex without adverse effects. Pt will continue to benefit from skilled PT to address strength and pain impairments to optimize function of both extremities. .  OBJECTIVE IMPAIRMENTS increased fascial restrictions, impaired UE functional use, and pain.   ACTIVITY LIMITATIONS carrying, lifting, and reach over head   PARTICIPATION LIMITATIONS: cleaning and occupation   PERSONAL FACTORS Past/current experiences, Profession, and Time since onset of injury/illness/exacerbation are also affecting patient's functional outcome.      GOALS:   SHORT TERM GOALS = LTGs     LONG TERM GOALS: Target date:  08/11/22    Pt will be Ind in a final HEP to maintain achieved LOF and QOL Baseline: not started Goal status: MET   2.  Pt will voice understanding of measures to assist in pain reduction  Baseline: started Goal status: MET   3.  Pt's FOTO score will improved to the predicted value of 73% as indication of improved function Baseline: 62% Status: 92% RUE, no survey for LUE Goal status:  MET   4.  Pt will report a decrease in R elbow pain to 4/10 or less with daily and work related activities for improved R UE function Baseline: 0-9/10 Status: 05/16/22:Pain level under 4/10 for elbow, average 0/10. Goal status: MET   5.  Pt will report no difficulty with lifting 10# overhead with either UE as indication of improved function with less pain Baseline: Pt reports some difficulty Status:06/20/22 = pain c L shoulder c more weakness L > R Goal status: Ongoing  6. Pt will report an improved pain range for the L shoulder with daily and work activities to 0-5/10.  Baseline: 2-9/10 Status: 06/20/22=4/10 near end range/painful arc Goal status: Improving  7. Pt will report good tolerance of both Upper Extremities with physical demands of his new job  Baseline: TBD  Goal Status: Initial   PLAN: PT FREQUENCY: 2x/week   PT DURATION: 4 weeks   PLANNED INTERVENTIONS: Therapeutic exercises, Therapeutic activity, Patient/Family education, Self Care, Joint mobilization, Dry Needling, Cryotherapy, Moist heat, Taping, Ultrasound, Ionotophoresis 66m/ml Dexamethasone, Manual therapy, and Re-evaluation   PLAN FOR NEXT SESSION:  L Shoulder strength/functional strengthening. R elbow and L shoulder strengthening.  Brack Shaddock MS, PT 06/21/22 1:59 PM

## 2022-06-21 ENCOUNTER — Ambulatory Visit: Payer: No Typology Code available for payment source | Attending: Sports Medicine

## 2022-06-21 DIAGNOSIS — G8929 Other chronic pain: Secondary | ICD-10-CM | POA: Insufficient documentation

## 2022-06-21 DIAGNOSIS — M25512 Pain in left shoulder: Secondary | ICD-10-CM | POA: Insufficient documentation

## 2022-06-21 DIAGNOSIS — M25521 Pain in right elbow: Secondary | ICD-10-CM | POA: Insufficient documentation

## 2022-06-23 ENCOUNTER — Ambulatory Visit: Payer: No Typology Code available for payment source | Admitting: Podiatry

## 2022-06-27 ENCOUNTER — Other Ambulatory Visit: Payer: Self-pay | Admitting: Family Medicine

## 2022-06-28 ENCOUNTER — Ambulatory Visit: Payer: No Typology Code available for payment source

## 2022-06-28 ENCOUNTER — Telehealth: Payer: Self-pay

## 2022-06-28 LAB — PSA: Prostate Specific Ag, Serum: 0.5 ng/mL (ref 0.0–4.0)

## 2022-06-28 NOTE — Telephone Encounter (Signed)
PT called and left voicemail regarding missed appointment.   Left reminder of next visit and call back number.  Ward Chatters PT, DPT 06/28/22 6:49 PM

## 2022-06-28 NOTE — Therapy (Incomplete)
OUTPATIENT PHYSICAL THERAPY TREATMENT NOTE   Patient Name: Jeffrey Griffin MRN: [id number] DOB:[birth year], 61 y.o., male Today's Date: 06/28/2022  PCP: [name], DO REFERRING PROVIDER: Thurman Coyer, DO; Velora Mediate, MD    END OF SESSION:          Past Medical History:  Diagnosis Date   Abnormality of thoracic aorta    4.3 CM ECTATIC ASENDING    Cardiopathy    CHF (congestive heart failure) ([place])    SYSTOLIC   Epistaxis 8/93/7342   Hyperlipidemia    Hypertension    Lung nodule    Obesity    OSA (obstructive sleep apnea)    Persistent atrial fibrillation (HCC)    WITH RVR   Pleural effusion    RIGHT UPPER LOBE AND RIGHT LOWER LOBE    Past Surgical History:  Procedure Laterality Date   APPENDECTOMY     ATRIAL FIBRILLATION ABLATION N/A 07/16/2020   Procedure: ATRIAL FIBRILLATION ABLATION;  Surgeon: [name], MD;  Location: [facility];  Service: Cardiovascular;  Laterality: N/A;   CARDIOVERSION N/A 10/20/2019   Procedure: CARDIOVERSION;  Surgeon: Acie Fredrickson Wonda Cheng, MD;  Location: [place];  Service: Cardiovascular;  Laterality: N/A;   CARDIOVERSION N/A 05/31/2020   Procedure: CARDIOVERSION;  Surgeon: Josue Hector, MD;  Location: [place];  Service: Cardiovascular;  Laterality: N/A;   IR ANGIOGRAM PULMONARY BILATERAL SELECTIVE  03/09/2022   IR ANGIOGRAM SELECTIVE EACH ADDITIONAL VESSEL  03/09/2022   IR ANGIOGRAM SELECTIVE EACH ADDITIONAL VESSEL  03/09/2022   IR THROMBECT PRIM MECH INIT (INCLU) MOD SED  03/09/2022   IR US GUIDE VASC ACCESS RIGHT  03/09/2022   RADIOLOGY WITH ANESTHESIA N/A 03/09/2022   Procedure: IR WITH ANESTHESIA;  Surgeon: Radiologist, Medication, MD;  Location: [place];  Service: Radiology;  Laterality: N/A;   VASECTOMY     Patient Active Problem List   Diagnosis Date Noted   Knee pain, bilateral 03/12/2022   Pulmonary embolism ([place]) 03/09/2022   OSA (obstructive sleep apnea) 03/09/2022   PE (pulmonary thromboembolism)  ([place]) 03/09/2022   S/P prostatectomy    Paroxysmal atrial fibrillation (HCC)    Fatigue 03/04/2021   Subcutaneous nodule of left foot 03/04/2021   Atypical atrial flutter ([place]) 05/26/2020   Secondary hypercoagulable state ([place]) 04/13/2020   Chronic combined systolic and diastolic CHF (congestive heart failure) ([place]) 04/06/2020   Persistent atrial fibrillation ([place]) 10/01/2019   Pulmonary embolus ([place]) 10/01/2019    REFERRING DIAG: M25.521 (ICD-10-CM) - Right elbow pain; Biceps tendonitis right; L shoulder OA and RC tendinopathy  THERAPY DIAG:  No diagnosis found.  Rationale for Evaluation and Treatment Rehabilitation  PERTINENT HISTORY: CHF, Afib  PRECAUTIONS: None  SUBJECTIVE:  ***  PAIN: L Shoulder Are you having pain?  4/10, end range/pain arc.otherwise 0/10 Yes: NPRS scale: Prior injection  2+ weeks ago 0/10 Pain location:L shoulder Pain description: ache and sharp Aggravating factors: Raising the the L arm, waking up with his L arm above his head, liftting weight OH Relieving factors: rest and support, ice pack, voltaren gel  PAIN: R elbow Are you having pain? 1-2/10  c resisted flexion other wise 0/10 Yes: NPRS scale: 0-1/10 L shoulder abd Pain location: R elbow Pain description: ache and sharp Aggravating factors: Elbow extension and flexion end range, certain r arm movements Relieving factors: rest and support, ice pack Pain range on eval: 0-3/10   OBJECTIVE: (objective measures completed at initial evaluation unless otherwise dated)  DIAGNOSTIC FINDINGS:  04/27/22 L shoulder  MRI FINDINGS: Rotator cuff: Moderate rotator cuff tendinopathy/tendinosis with interstitial tears and areas of bursal and articular surface fraying and fibrillation. No full thickness retracted rotator cuff tear is identified.   Muscles:  No significant findings.   Biceps long head: Intact. Mild tendinopathy involving the intra-articular portion.   Acromioclavicular Joint:  Moderate degenerative changes with joint effusion. Type 1-2 acromion. No lateral downsloping or subacromial spurring.   Glenohumeral Joint: Significant age advanced degenerative changes with significant cartilage loss, joint space narrowing, osteophytic spurring and subchondral cystic change. Small joint effusion.   Labrum: The superior labrum is degenerated and torn. The anterior and posterior labrum are grossly normal.   Bones:  No acute bony findings.   Other: Mild subacromial/subdeltoid bursitis. Moderate rotator interval synovitis.   IMPRESSION: 1. Moderate rotator cuff tendinopathy/tendinosis as detailed above. No full thickness retracted rotator cuff tear. 2. Intact long head biceps tendon. Mild tendinopathy involving the intra-articular portion. 3. Significant age advanced glenohumeral joint degenerative changes. 4. Degenerated and torn superior labrum. 5. Moderate AC joint degenerative changes but no other significant findings for bony impingement. 6. Mild subacromial/subdeltoid bursitis and moderate rotator interval synovitis.    R elbow Xray 04/18/22 IMPRESSION: Negative.   PATIENT SURVEYS:  FOTO Perceived function 62%, predicted 73% FOTO 92% -FOTO DISCHARGED FOR RIGHT UE   COGNITION:           Overall cognitive status: Within functional limits for tasks assessed                                  SENSATION: WFL   POSTURE: Forward head and rounded shoulders   UPPER EXTREMITY ROM:                        WNLs Active ROM Right Eval 04/21/22 Left Eval 05/11/22  Shoulder flexion   WNLs pain only at end range  Shoulder extension    WNLs   Shoulder abduction    WNLs pain only at end range  Shoulder adduction      Shoulder internal rotation   T9 Painful  Shoulder external rotation Pain at end range T4   Elbow flexion Pain at end range    Elbow extension      Wrist flexion      Wrist extension      Wrist ulnar deviation      Wrist radial deviation      Wrist  pronation      Wrist supination Pain at end range    (Blank rows = not tested)   UPPER EXTREMITY MMT:   MMT Right Eval 04/21/22 Left Eval 05/11/22 Right 05/18/22 Left 05/18/22  Shoulder flexion 5  5    Shoulder extension 5  5    Shoulder abduction 5  5    Shoulder adduction 5  5    Shoulder internal rotation 5, pain  5    Shoulder external rotation 5  5    Middle trapezius        Lower trapezius        Elbow flexion 5, pain  5    Elbow extension 5  5    Wrist flexion 5, pain      Wrist extension 5      Wrist ulnar deviation 5      Wrist radial deviation 5      Wrist pronation 5      Wrist  supination 5      Grip strength (lbs) TBA TBA 120 lb 108 lb  (Blank rows = not tested)   SHOULDER SPECIAL TESTS:  05/11/22 Hawkins-Kennedy: Pos; Empty can: Neg; Full can: Neg. Crank test: Pos for pain, but not clicking and popping             04/21/22: Biceps assessment: Yergason's test: negative and Speed's test: negative   JOINT MOBILITY TESTING:  Medial and lateral elbow ligament stress testing negative   PALPATION:  TTP to the distal aspect and insertion of the bicep tendon  TODAY'S TREATMENT:  [facility] Adult PT Treatment:                                                DATE: 06/28/22 Therapeutic Exercise: UBE L2.5 x 2 min each  Shoulder row Cables 17#  2x10 Shoulder L ER red band 10 x 2  Shoulder L IR red band 10 x 2 Shoulder ext Cables 17# 2x10 Shoulder chest press 10# cable 2 x 10  Shoulder flexion 3# standing to 90d Shoulder scaption 3# standing to 90d R Hammer to overhead 8# 2 x 10 Bilat Bicep Curl 10# 10 x 2  Pronation/supination 2x10 6# DB   [facility] Adult PT Treatment:                                                DATE: 06/21/22 Therapeutic Exercise: UBE L2.5 x 2 min each  Shoulder row Cables 17#  2x10 Shoulder L ER red band 10 x 2  Shoulder L IR red band 10 x 2 Shoulder ext Cables 17# 2x10 Shoulder chest press 10# cable 2 x 10  Shoulder flexion 3# standing to  90d Shoulder scaption 3# standing to 90d R Hammer to overhead 8# 2 x 10 Bilat Bicep Curl 10# 10 x 2  Pronation/supination 2x10 6# DB   [facility] Adult PT Treatment:                                                DATE: 05/24/22 Therapeutic Exercise: UBE L2.5 x 2 min each  Shoulder row Cables 20#  2x10 Shoulder L ER red band 10 x 2  Shoulder L IR red band 10 x 2 Shoulder ext Cables 20# 2x10 Shoulder chest press 10# cable 2 x 10  Shoulder flexion 3# standing L shoulder clocks at 90 degrees flexion x2 5# S/L Left shoulder abduction 3# 10 x 2  Seated L OH press 5#  2x10 R Hammer to overhead 10# 2 x 10 Bilat Bicep Curl 15# 10 x 2  Pronation/supination 2x10 6# DB R  PATIENT EDUCATION: Education details: HEP, CFM Person educated: Patient Education method: Explanation, Demonstration, Tactile cues, and Verbal cues Education comprehension: verbalized understanding, returned demonstration, verbal cues required, and tactile cues required    HOME EXERCISE PROGRAM: Access Code: [email or web address] URL: [email or web address] Date: 04/25/2022 Prepared by: Octavio Manns  Exercises - Seated Elbow Flexion and Extension AROM  - 1 x daily - 7 x weekly - 3 sets - 10 reps - Forearm Pronation and Supination with Hammer  -  1 x daily - 7 x weekly - 3 sets - 10 reps - Standing Bicep Stretch at Wall  - 1 x daily - 7 x weekly - 2-3 reps - 30 sec hold  Date: 05/11/2022 Added and prepared: by Gar Ponto  - Standing Shoulder Row with Anchored Resistance  - 1 x daily - 7 x weekly - 2-3 sets - 10 reps - 2 hold - Shoulder extension with resistance - Neutral  - 1 x daily - 7 x weekly - 2-3 sets - 10 reps - 2 hold -added 05/18/22 - Sidelying Shoulder Abduction Palm Forward  - 1 x daily - 7 x weekly - 2-3 sets - 10 reps - Sidelying Shoulder ER with Towel and Dumbbell  - 1 x daily - 7 x weekly - 2-3 sets - 10 reps   ASSESSMENT:   CLINICAL IMPRESSION: ***  OBJECTIVE IMPAIRMENTS increased fascial  restrictions, impaired UE functional use, and pain.   ACTIVITY LIMITATIONS carrying, lifting, and reach over head   PARTICIPATION LIMITATIONS: cleaning and occupation   PERSONAL FACTORS Past/current experiences, Profession, and Time since onset of injury/illness/exacerbation are also affecting patient's functional outcome.      GOALS:   SHORT TERM GOALS = LTGs     LONG TERM GOALS: Target date: 08/11/22    Pt will be Ind in a final HEP to maintain achieved LOF and QOL Baseline: not started Goal status: MET   2.  Pt will voice understanding of measures to assist in pain reduction  Baseline: started Goal status: MET   3.  Pt's FOTO score will improved to the predicted value of 73% as indication of improved function Baseline: 62% Status: 92% RUE, no survey for LUE Goal status: MET   4.  Pt will report a decrease in R elbow pain to 4/10 or less with daily and work related activities for improved R UE function Baseline: 0-9/10 Status: 05/16/22:Pain level under 4/10 for elbow, average 0/10. Goal status: MET   5.  Pt will report no difficulty with lifting 10# overhead with either UE as indication of improved function with less pain Baseline: Pt reports some difficulty Status:06/20/22 = pain c L shoulder c more weakness L > R Goal status: Ongoing  6. Pt will report an improved pain range for the L shoulder with daily and work activities to 0-5/10.  Baseline: 2-9/10 Status: 06/20/22=4/10 near end range/painful arc Goal status: Improving  7. Pt will report good tolerance of both Upper Extremities with physical demands of his new job  Baseline: TBD  Goal Status: Initial   PLAN: PT FREQUENCY: 2x/week   PT DURATION: 4 weeks   PLANNED INTERVENTIONS: Therapeutic exercises, Therapeutic activity, Patient/Family education, Self Care, Joint mobilization, Dry Needling, Cryotherapy, Moist heat, Taping, Ultrasound, Ionotophoresis 4mg /ml Dexamethasone, Manual therapy, and Re-evaluation    PLAN FOR NEXT SESSION:  L Shoulder strength/functional strengthening. R elbow and L shoulder strengthening.   Ward Chatters PT  06/28/22 9:18 AM

## 2022-07-03 ENCOUNTER — Ambulatory Visit: Payer: No Typology Code available for payment source | Admitting: Family Medicine

## 2022-07-03 NOTE — Patient Instructions (Incomplete)
It was so great seeing you today! Today we discussed the following:  -   -    Please make sure to bring any medications you take to your appointments. If you have any questions or concerns please call the office at [phone].

## 2022-07-03 NOTE — Progress Notes (Deleted)
    SUBJECTIVE:   CHIEF COMPLAINT / HPI:   ***  PERTINENT  PMH / PSH: ***  OBJECTIVE:   There were no vitals taken for this visit.  ***  ASSESSMENT/PLAN:   No problem-specific Assessment & Plan notes found for this encounter.     [name], [place]

## 2022-07-05 ENCOUNTER — Ambulatory Visit: Payer: No Typology Code available for payment source

## 2022-07-05 NOTE — Therapy (Incomplete)
OUTPATIENT PHYSICAL THERAPY TREATMENT NOTE   Patient Name: Jeffrey Griffin MRN: [id number] DOB:[birth year], 61 y.o., male Today's Date: 07/05/2022  PCP: [name], DO REFERRING PROVIDER: Thurman Coyer, DO; Velora Mediate, MD    END OF SESSION:          Past Medical History:  Diagnosis Date   Abnormality of thoracic aorta    4.3 CM ECTATIC ASENDING    Cardiopathy    CHF (congestive heart failure) ([place])    SYSTOLIC   Epistaxis 2/95/6213   Hyperlipidemia    Hypertension    Lung nodule    Obesity    OSA (obstructive sleep apnea)    Persistent atrial fibrillation (HCC)    WITH RVR   Pleural effusion    RIGHT UPPER LOBE AND RIGHT LOWER LOBE    Past Surgical History:  Procedure Laterality Date   APPENDECTOMY     ATRIAL FIBRILLATION ABLATION N/A 07/16/2020   Procedure: ATRIAL FIBRILLATION ABLATION;  Surgeon: [name], MD;  Location: [place] [facility];  Service: Cardiovascular;  Laterality: N/A;   CARDIOVERSION N/A 10/20/2019   Procedure: CARDIOVERSION;  Surgeon: Acie Fredrickson Wonda Cheng, MD;  Location: [place];  Service: Cardiovascular;  Laterality: N/A;   CARDIOVERSION N/A 05/31/2020   Procedure: CARDIOVERSION;  Surgeon: Josue Hector, MD;  Location: [facility];  Service: Cardiovascular;  Laterality: N/A;   IR ANGIOGRAM PULMONARY BILATERAL SELECTIVE  03/09/2022   IR ANGIOGRAM SELECTIVE EACH ADDITIONAL VESSEL  03/09/2022   IR ANGIOGRAM SELECTIVE EACH ADDITIONAL VESSEL  03/09/2022   IR THROMBECT PRIM MECH INIT (INCLU) MOD SED  03/09/2022   IR US GUIDE VASC ACCESS RIGHT  03/09/2022   RADIOLOGY WITH ANESTHESIA N/A 03/09/2022   Procedure: IR WITH ANESTHESIA;  Surgeon: Radiologist, Medication, MD;  Location: [place];  Service: Radiology;  Laterality: N/A;   VASECTOMY     Patient Active Problem List   Diagnosis Date Noted   Knee pain, bilateral 03/12/2022   Pulmonary embolism ([place]) 03/09/2022   OSA (obstructive sleep apnea) 03/09/2022   PE (pulmonary thromboembolism)  ([place]) 03/09/2022   S/P prostatectomy    Paroxysmal atrial fibrillation (HCC)    Fatigue 03/04/2021   Subcutaneous nodule of left foot 03/04/2021   Atypical atrial flutter ([place]) 05/26/2020   Secondary hypercoagulable state ([place]) 04/13/2020   Chronic combined systolic and diastolic CHF (congestive heart failure) ([place]) 04/06/2020   Persistent atrial fibrillation ([employer]) 10/01/2019   Pulmonary embolus ([place]) 10/01/2019    REFERRING DIAG: M25.521 (ICD-10-CM) - Right elbow pain; Biceps tendonitis right; L shoulder OA and RC tendinopathy  THERAPY DIAG:  No diagnosis found.  Rationale for Evaluation and Treatment Rehabilitation  PERTINENT HISTORY: CHF, Afib  PRECAUTIONS: None  SUBJECTIVE:  ***  PAIN: L Shoulder Are you having pain?  4/10, end range/pain arc.otherwise 0/10 Yes: NPRS scale: Prior injection  2+ weeks ago 0/10 Pain location:L shoulder Pain description: ache and sharp Aggravating factors: Raising the the L arm, waking up with his L arm above his head, liftting weight [place] Relieving factors: rest and support, ice pack, voltaren gel  PAIN: R elbow Are you having pain? 1-2/10  c resisted flexion other wise 0/10 Yes: NPRS scale: 0-1/10 L shoulder abd Pain location: R elbow Pain description: ache and sharp Aggravating factors: Elbow extension and flexion end range, certain r arm movements Relieving factors: rest and support, ice pack Pain range on eval: 0-3/10   OBJECTIVE: (objective measures completed at initial evaluation unless otherwise dated)  DIAGNOSTIC FINDINGS:  04/27/22 L shoulder  MRI FINDINGS: Rotator cuff: Moderate rotator cuff tendinopathy/tendinosis with interstitial tears and areas of bursal and articular surface fraying and fibrillation. No full thickness retracted rotator cuff tear is identified.   Muscles:  No significant findings.   Biceps long head: Intact. Mild tendinopathy involving the intra-articular portion.   Acromioclavicular Joint:  Moderate degenerative changes with joint effusion. Type 1-2 acromion. No lateral downsloping or subacromial spurring.   Glenohumeral Joint: Significant age advanced degenerative changes with significant cartilage loss, joint space narrowing, osteophytic spurring and subchondral cystic change. Small joint effusion.   Labrum: The superior labrum is degenerated and torn. The anterior and posterior labrum are grossly normal.   Bones:  No acute bony findings.   Other: Mild subacromial/subdeltoid bursitis. Moderate rotator interval synovitis.   IMPRESSION: 1. Moderate rotator cuff tendinopathy/tendinosis as detailed above. No full thickness retracted rotator cuff tear. 2. Intact long head biceps tendon. Mild tendinopathy involving the intra-articular portion. 3. Significant age advanced glenohumeral joint degenerative changes. 4. Degenerated and torn superior labrum. 5. Moderate AC joint degenerative changes but no other significant findings for bony impingement. 6. Mild subacromial/subdeltoid bursitis and moderate rotator interval synovitis.    R elbow Xray 04/18/22 IMPRESSION: Negative.   PATIENT SURVEYS:  FOTO Perceived function 62%, predicted 73% FOTO 92% -FOTO DISCHARGED FOR RIGHT UE   COGNITION:           Overall cognitive status: Within functional limits for tasks assessed                                  SENSATION: WFL   POSTURE: Forward head and rounded shoulders   UPPER EXTREMITY ROM:                        WNLs Active ROM Right Eval 04/21/22 Left Eval 05/11/22  Shoulder flexion   WNLs pain only at end range  Shoulder extension    WNLs   Shoulder abduction    WNLs pain only at end range  Shoulder adduction      Shoulder internal rotation   T9 Painful  Shoulder external rotation Pain at end range T4   Elbow flexion Pain at end range    Elbow extension      Wrist flexion      Wrist extension      Wrist ulnar deviation      Wrist radial deviation      Wrist  pronation      Wrist supination Pain at end range    (Blank rows = not tested)   UPPER EXTREMITY MMT:   MMT Right Eval 04/21/22 Left Eval 05/11/22 Right 05/18/22 Left 05/18/22  Shoulder flexion 5  5    Shoulder extension 5  5    Shoulder abduction 5  5    Shoulder adduction 5  5    Shoulder internal rotation 5, pain  5    Shoulder external rotation 5  5    Middle trapezius        Lower trapezius        Elbow flexion 5, pain  5    Elbow extension 5  5    Wrist flexion 5, pain      Wrist extension 5      Wrist ulnar deviation 5      Wrist radial deviation 5      Wrist pronation 5      Wrist  supination 5      Grip strength (lbs) TBA TBA 120 lb 108 lb  (Blank rows = not tested)   SHOULDER SPECIAL TESTS:  05/11/22 Hawkins-Kennedy: Pos; Empty can: Neg; Full can: Neg. Crank test: Pos for pain, but not clicking and popping             04/21/22: Biceps assessment: Yergason's test: negative and Speed's test: negative   JOINT MOBILITY TESTING:  Medial and lateral elbow ligament stress testing negative   PALPATION:  TTP to the distal aspect and insertion of the bicep tendon  TODAY'S TREATMENT:  [facility] Adult PT Treatment:                                                DATE: 07/05/22 Therapeutic Exercise: UBE L2.5 x 2 min each  Shoulder row Cables 17#  2x10 Shoulder L ER red band 10 x 2  Shoulder L IR red band 10 x 2 Shoulder ext Cables 17# 2x10 Shoulder chest press 10# cable 2 x 10  Shoulder flexion 3# standing to 90d Shoulder scaption 3# standing to 90d R Hammer to overhead 8# 2 x 10 Bilat Bicep Curl 10# 10 x 2  Pronation/supination 2x10 6# DB   [facility] Adult PT Treatment:                                                DATE: 06/21/22 Therapeutic Exercise: UBE L2.5 x 2 min each  Shoulder row Cables 17#  2x10 Shoulder L ER red band 10 x 2  Shoulder L IR red band 10 x 2 Shoulder ext Cables 17# 2x10 Shoulder chest press 10# cable 2 x 10  Shoulder flexion 3# standing to 90d Shoulder  scaption 3# standing to 90d R Hammer to overhead 8# 2 x 10 Bilat Bicep Curl 10# 10 x 2  Pronation/supination 2x10 6# DB   [facility] Adult PT Treatment:                                                DATE: 05/24/22 Therapeutic Exercise: UBE L2.5 x 2 min each  Shoulder row Cables 20#  2x10 Shoulder L ER red band 10 x 2  Shoulder L IR red band 10 x 2 Shoulder ext Cables 20# 2x10 Shoulder chest press 10# cable 2 x 10  Shoulder flexion 3# standing L shoulder clocks at 90 degrees flexion x2 5# S/L Left shoulder abduction 3# 10 x 2  Seated L OH press 5#  2x10 R Hammer to overhead 10# 2 x 10 Bilat Bicep Curl 15# 10 x 2  Pronation/supination 2x10 6# DB R  PATIENT EDUCATION: Education details: HEP, CFM Person educated: Patient Education method: Explanation, Demonstration, Tactile cues, and Verbal cues Education comprehension: verbalized understanding, returned demonstration, verbal cues required, and tactile cues required    HOME EXERCISE PROGRAM: Access Code: [email or web address] URL: [email or web address] Date: 04/25/2022 Prepared by: Octavio Manns  Exercises - Seated Elbow Flexion and Extension AROM  - 1 x daily - 7 x weekly - 3 sets - 10 reps - Forearm Pronation and Supination with Hammer  -  1 x daily - 7 x weekly - 3 sets - 10 reps - Standing Bicep Stretch at Wall  - 1 x daily - 7 x weekly - 2-3 reps - 30 sec hold  Date: 05/11/2022 Added and prepared: by Gar Ponto  - Standing Shoulder Row with Anchored Resistance  - 1 x daily - 7 x weekly - 2-3 sets - 10 reps - 2 hold - Shoulder extension with resistance - Neutral  - 1 x daily - 7 x weekly - 2-3 sets - 10 reps - 2 hold -added 05/18/22 - Sidelying Shoulder Abduction Palm Forward  - 1 x daily - 7 x weekly - 2-3 sets - 10 reps - Sidelying Shoulder ER with Towel and Dumbbell  - 1 x daily - 7 x weekly - 2-3 sets - 10 reps   ASSESSMENT:   CLINICAL IMPRESSION: ***  OBJECTIVE IMPAIRMENTS increased fascial restrictions, impaired UE  functional use, and pain.   ACTIVITY LIMITATIONS carrying, lifting, and reach over head   PARTICIPATION LIMITATIONS: cleaning and occupation   PERSONAL FACTORS Past/current experiences, Profession, and Time since onset of injury/illness/exacerbation are also affecting patient's functional outcome.      GOALS:   SHORT TERM GOALS = LTGs     LONG TERM GOALS: Target date: 08/11/22    Pt will be Ind in a final HEP to maintain achieved LOF and QOL Baseline: not started Goal status: MET   2.  Pt will voice understanding of measures to assist in pain reduction  Baseline: started Goal status: MET   3.  Pt's FOTO score will improved to the predicted value of 73% as indication of improved function Baseline: 62% Status: 92% RUE, no survey for LUE Goal status: MET   4.  Pt will report a decrease in R elbow pain to 4/10 or less with daily and work related activities for improved R UE function Baseline: 0-9/10 Status: 05/16/22:Pain level under 4/10 for elbow, average 0/10. Goal status: MET   5.  Pt will report no difficulty with lifting 10# overhead with either UE as indication of improved function with less pain Baseline: Pt reports some difficulty Status:06/20/22 = pain c L shoulder c more weakness L > R Goal status: Ongoing  6. Pt will report an improved pain range for the L shoulder with daily and work activities to 0-5/10.  Baseline: 2-9/10 Status: 06/20/22=4/10 near end range/painful arc Goal status: Improving  7. Pt will report good tolerance of both Upper Extremities with physical demands of his new job  Baseline: TBD  Goal Status: Initial   PLAN: PT FREQUENCY: 2x/week   PT DURATION: 4 weeks   PLANNED INTERVENTIONS: Therapeutic exercises, Therapeutic activity, Patient/Family education, Self Care, Joint mobilization, Dry Needling, Cryotherapy, Moist heat, Taping, Ultrasound, Ionotophoresis 1m/ml Dexamethasone, Manual therapy, and Re-evaluation   PLAN FOR NEXT SESSION:  L  Shoulder strength/functional strengthening. R elbow and L shoulder strengthening.   DWard ChattersPT  07/05/22 10:38 AM

## 2022-07-11 ENCOUNTER — Encounter: Payer: No Typology Code available for payment source | Admitting: Physical Therapy

## 2022-07-12 ENCOUNTER — Ambulatory Visit: Payer: No Typology Code available for payment source | Attending: Sports Medicine

## 2022-07-12 DIAGNOSIS — M25521 Pain in right elbow: Secondary | ICD-10-CM | POA: Diagnosis not present

## 2022-07-12 DIAGNOSIS — M25512 Pain in left shoulder: Secondary | ICD-10-CM | POA: Diagnosis present

## 2022-07-12 DIAGNOSIS — G8929 Other chronic pain: Secondary | ICD-10-CM | POA: Diagnosis present

## 2022-07-12 NOTE — Therapy (Signed)
OUTPATIENT PHYSICAL THERAPY TREATMENT NOTE   Patient Name: Jeffrey Griffin MRN: [id number] DOB:August 28, 1961, 61 y.o., male Today's Date: 07/13/2022  PCP: [name], DO REFERRING PROVIDER: Thurman Coyer, DO; Velora Mediate, MD    END OF SESSION:   PT End of Session - 07/12/22 1822     Visit Number 11    Number of Visits 14    Date for PT Re-Evaluation 08/11/22    Authorization Type [place] PREFERRED    PT Start Time 1825    PT Stop Time 1905    PT Time Calculation (min) 40 min    Activity Tolerance Patient tolerated treatment well    Behavior During Therapy WFL for tasks assessed/performed                   Past Medical History:  Diagnosis Date   Abnormality of thoracic aorta    4.3 CM ECTATIC ASENDING    Cardiopathy    CHF (congestive heart failure) ([place])    SYSTOLIC   Epistaxis 6/80/8811   Hyperlipidemia    Hypertension    Lung nodule    Obesity    OSA (obstructive sleep apnea)    Persistent atrial fibrillation ([place])    WITH RVR   Pleural effusion    RIGHT UPPER LOBE AND RIGHT LOWER LOBE    Past Surgical History:  Procedure Laterality Date   APPENDECTOMY     ATRIAL FIBRILLATION ABLATION N/A 07/16/2020   Procedure: ATRIAL FIBRILLATION ABLATION;  Surgeon: [name], MD;  Location: [facility];  Service: Cardiovascular;  Laterality: N/A;   CARDIOVERSION N/A 10/20/2019   Procedure: CARDIOVERSION;  Surgeon: Acie Fredrickson Wonda Cheng, MD;  Location: [facility];  Service: Cardiovascular;  Laterality: N/A;   CARDIOVERSION N/A 05/31/2020   Procedure: CARDIOVERSION;  Surgeon: Josue Hector, MD;  Location: [facility];  Service: Cardiovascular;  Laterality: N/A;   IR ANGIOGRAM PULMONARY BILATERAL SELECTIVE  03/09/2022   IR ANGIOGRAM SELECTIVE EACH ADDITIONAL VESSEL  03/09/2022   IR ANGIOGRAM SELECTIVE EACH ADDITIONAL VESSEL  03/09/2022   IR THROMBECT PRIM MECH INIT (INCLU) MOD SED  03/09/2022   IR US GUIDE VASC ACCESS RIGHT  03/09/2022   RADIOLOGY WITH  ANESTHESIA N/A 03/09/2022   Procedure: IR WITH ANESTHESIA;  Surgeon: Radiologist, Medication, MD;  Location: [facility];  Service: Radiology;  Laterality: N/A;   VASECTOMY     Patient Active Problem List   Diagnosis Date Noted   Knee pain, bilateral 03/12/2022   Pulmonary embolism ([place]) 03/09/2022   OSA (obstructive sleep apnea) 03/09/2022   PE (pulmonary thromboembolism) ([place]) 03/09/2022   S/P prostatectomy    Paroxysmal atrial fibrillation ([facility])    Fatigue 03/04/2021   Subcutaneous nodule of left foot 03/04/2021   Atypical atrial flutter ([place]) 05/26/2020   Secondary hypercoagulable state ([place]) 04/13/2020   Chronic combined systolic and diastolic CHF (congestive heart failure) ([place]) 04/06/2020   Persistent atrial fibrillation ([place]) 10/01/2019   Pulmonary embolus ([place]) 10/01/2019    REFERRING DIAG: M25.521 (ICD-10-CM) - Right elbow pain; Biceps tendonitis right; L shoulder OA and RC tendinopathy  THERAPY DIAG:  Pain in right elbow  Chronic left shoulder pain  Rationale for Evaluation and Treatment Rehabilitation  PERTINENT HISTORY: CHF, Afib  PRECAUTIONS: None  SUBJECTIVE:  Pt presents to PT with reports of continued L shoulder and R elbow pain. Has not been as compliant with HEP as he would have liked due to work schedule. Is ready to begin PT at this time.   PAIN:  L Shoulder Are you having pain?  4/10, end range/pain arc.otherwise 0/10 Yes: NPRS scale: Prior injection  2+ weeks ago 0/10 Pain location:L shoulder Pain description: ache and sharp Aggravating factors: Raising the the L arm, waking up with his L arm above his head, liftting weight OH Relieving factors: rest and support, ice pack, voltaren gel  PAIN: R elbow Are you having pain? 1-2/10  c resisted flexion other wise 0/10 Yes: NPRS scale: 0-1/10 L shoulder abd Pain location: R elbow Pain description: ache and sharp Aggravating factors: Elbow extension and flexion end range, certain r arm movements Relieving  factors: rest and support, ice pack Pain range on eval: 0-3/10   OBJECTIVE: (objective measures completed at initial evaluation unless otherwise dated)  DIAGNOSTIC FINDINGS:  04/27/22 L shoulder MRI FINDINGS: Rotator cuff: Moderate rotator cuff tendinopathy/tendinosis with interstitial tears and areas of bursal and articular surface fraying and fibrillation. No full thickness retracted rotator cuff tear is identified.   Muscles:  No significant findings.   Biceps long head: Intact. Mild tendinopathy involving the intra-articular portion.   Acromioclavicular Joint: Moderate degenerative changes with joint effusion. Type 1-2 acromion. No lateral downsloping or subacromial spurring.   Glenohumeral Joint: Significant age advanced degenerative changes with significant cartilage loss, joint space narrowing, osteophytic spurring and subchondral cystic change. Small joint effusion.   Labrum: The superior labrum is degenerated and torn. The anterior and posterior labrum are grossly normal.   Bones:  No acute bony findings.   Other: Mild subacromial/subdeltoid bursitis. Moderate rotator interval synovitis.   IMPRESSION: 1. Moderate rotator cuff tendinopathy/tendinosis as detailed above. No full thickness retracted rotator cuff tear. 2. Intact long head biceps tendon. Mild tendinopathy involving the intra-articular portion. 3. Significant age advanced glenohumeral joint degenerative changes. 4. Degenerated and torn superior labrum. 5. Moderate AC joint degenerative changes but no other significant findings for bony impingement. 6. Mild subacromial/subdeltoid bursitis and moderate rotator interval synovitis.    R elbow Xray 04/18/22 IMPRESSION: Negative.   PATIENT SURVEYS:  FOTO Perceived function 62%, predicted 73% FOTO 92% -FOTO DISCHARGED FOR RIGHT UE   COGNITION:           Overall cognitive status: Within functional limits for tasks assessed                                   SENSATION: WFL   POSTURE: Forward head and rounded shoulders   UPPER EXTREMITY ROM:                        WNLs Active ROM Right Eval 04/21/22 Left Eval 05/11/22  Shoulder flexion   WNLs pain only at end range  Shoulder extension    WNLs   Shoulder abduction    WNLs pain only at end range  Shoulder adduction      Shoulder internal rotation   T9 Painful  Shoulder external rotation Pain at end range T4   Elbow flexion Pain at end range    Elbow extension      Wrist flexion      Wrist extension      Wrist ulnar deviation      Wrist radial deviation      Wrist pronation      Wrist supination Pain at end range    (Blank rows = not tested)   UPPER EXTREMITY MMT:   MMT Right Eval 04/21/22 Left Eval 05/11/22 Right 05/18/22  Left 05/18/22  Shoulder flexion 5  5    Shoulder extension 5  5    Shoulder abduction 5  5    Shoulder adduction 5  5    Shoulder internal rotation 5, pain  5    Shoulder external rotation 5  5    Middle trapezius        Lower trapezius        Elbow flexion 5, pain  5    Elbow extension 5  5    Wrist flexion 5, pain      Wrist extension 5      Wrist ulnar deviation 5      Wrist radial deviation 5      Wrist pronation 5      Wrist supination 5      Grip strength (lbs) TBA TBA 120 lb 108 lb  (Blank rows = not tested)   SHOULDER SPECIAL TESTS:  05/11/22 Hawkins-Kennedy: Pos; Empty can: Neg; Full can: Neg. Crank test: Pos for pain, but not clicking and popping             04/21/22: Biceps assessment: Yergason's test: negative and Speed's test: negative   JOINT MOBILITY TESTING:  Medial and lateral elbow ligament stress testing negative   PALPATION:  TTP to the distal aspect and insertion of the bicep tendon  TODAY'S TREATMENT:  [facility] Adult PT Treatment:                                                DATE: 07/12/22 Therapeutic Exercise: UBE L2.5 x 2 min each  Shoulder row Cables 33#  2x10 Shoulder L IR/ER 2x15 10# Shoulder ext Cables 33# 2x10 Body  blade IR/ER 2x30" L Body blade flex/ext 90 L Wrist curl bar 2x30" flex/ext Serratus punch 2x10 5# each Pronation/supination 2x10 8# DB   [facility] Adult PT Treatment:                                                DATE: 06/21/22 Therapeutic Exercise: UBE L2.5 x 2 min each  Shoulder row Cables 17#  2x10 Shoulder L ER red band 10 x 2  Shoulder L IR red band 10 x 2 Shoulder ext Cables 17# 2x10 Shoulder chest press 10# cable 2 x 10  Shoulder flexion 3# standing to 90d Shoulder scaption 3# standing to 90d R Hammer to overhead 8# 2 x 10 Bilat Bicep Curl 10# 10 x 2  Pronation/supination 2x10 6# DB   [facility] Adult PT Treatment:                                                DATE: 05/24/22 Therapeutic Exercise: UBE L2.5 x 2 min each  Shoulder row Cables 20#  2x10 Shoulder L ER red band 10 x 2  Shoulder L IR red band 10 x 2 Shoulder ext Cables 20# 2x10 Shoulder chest press 10# cable 2 x 10  Shoulder flexion 3# standing L shoulder clocks at 90 degrees flexion x2 5# S/L Left shoulder abduction 3# 10 x 2  Seated L OH press 5#  2x10 R Hammer to overhead 10# 2 x 10 Bilat Bicep Curl 15# 10 x 2  Pronation/supination 2x10 6# DB R  PATIENT EDUCATION: Education details: HEP, CFM Person educated: Patient Education method: Explanation, Demonstration, Tactile cues, and Verbal cues Education comprehension: verbalized understanding, returned demonstration, verbal cues required, and tactile cues required    HOME EXERCISE PROGRAM: Access Code: [email or web address] URL: [email or web address] Date: 04/25/2022 Prepared by: Octavio Manns  Exercises - Seated Elbow Flexion and Extension AROM  - 1 x daily - 7 x weekly - 3 sets - 10 reps - Forearm Pronation and Supination with Hammer  - 1 x daily - 7 x weekly - 3 sets - 10 reps - Standing Bicep Stretch at [place]  - 1 x daily - 7 x weekly - 2-3 reps - 30 sec hold  Date: 05/11/2022 Added and prepared: by Gar Ponto  - Standing Shoulder Row with Anchored  Resistance  - 1 x daily - 7 x weekly - 2-3 sets - 10 reps - 2 hold - Shoulder extension with resistance - Neutral  - 1 x daily - 7 x weekly - 2-3 sets - 10 reps - 2 hold -added 05/18/22 - Sidelying Shoulder Abduction Palm Forward  - 1 x daily - 7 x weekly - 2-3 sets - 10 reps - Sidelying Shoulder ER with Towel and Dumbbell  - 1 x daily - 7 x weekly - 2-3 sets - 10 reps   ASSESSMENT:   CLINICAL IMPRESSION: Pt was able to complete all prescribed exercises, focusing on improving L shoulder RTC and periscapular strength as well as decreasing pain in R elbow/forearm. Responded well to therapy, noting decreased pain post session. Continues to benefit from skilled PT, will progress as tolerated per POC.   OBJECTIVE IMPAIRMENTS increased fascial restrictions, impaired UE functional use, and pain.   ACTIVITY LIMITATIONS carrying, lifting, and reach over head   PARTICIPATION LIMITATIONS: cleaning and occupation   PERSONAL FACTORS Past/current experiences, Profession, and Time since onset of injury/illness/exacerbation are also affecting patient's functional outcome.      GOALS:   SHORT TERM GOALS = LTGs     LONG TERM GOALS: Target date: 08/11/22    Pt will be Ind in a final HEP to maintain achieved LOF and QOL Baseline: not started Goal status: MET   2.  Pt will voice understanding of measures to assist in pain reduction  Baseline: started Goal status: MET   3.  Pt's FOTO score will improved to the predicted value of 73% as indication of improved function Baseline: 62% Status: 92% RUE, no survey for LUE Goal status: MET   4.  Pt will report a decrease in R elbow pain to 4/10 or less with daily and work related activities for improved R UE function Baseline: 0-9/10 Status: 05/16/22:Pain level under 4/10 for elbow, average 0/10. Goal status: MET   5.  Pt will report no difficulty with lifting 10# overhead with either UE as indication of improved function with less pain Baseline: Pt  reports some difficulty Status:06/20/22 = pain c L shoulder c more weakness L > R Goal status: Ongoing  6. Pt will report an improved pain range for the L shoulder with daily and work activities to 0-5/10.  Baseline: 2-9/10 Status: 06/20/22=4/10 near end range/painful arc Goal status: Improving  7. Pt will report good tolerance of both Upper Extremities with physical demands of his new job  Baseline: TBD  Goal Status: Initial   PLAN: PT FREQUENCY: 2x/week   PT  DURATION: 4 weeks   PLANNED INTERVENTIONS: Therapeutic exercises, Therapeutic activity, Patient/Family education, Self Care, Joint mobilization, Dry Needling, Cryotherapy, Moist heat, Taping, Ultrasound, Ionotophoresis 16m/ml Dexamethasone, Manual therapy, and Re-evaluation   PLAN FOR NEXT SESSION:  L Shoulder strength/functional strengthening. R elbow and L shoulder strengthening.   DWard ChattersPT  07/13/22 11:45 AM

## 2022-07-13 ENCOUNTER — Encounter: Payer: No Typology Code available for payment source | Admitting: Physical Therapy

## 2022-07-17 ENCOUNTER — Encounter: Payer: Self-pay | Admitting: Cardiology

## 2022-07-17 ENCOUNTER — Ambulatory Visit: Payer: No Typology Code available for payment source | Attending: Cardiology | Admitting: Cardiology

## 2022-07-17 VITALS — BP 126/88 | HR 71 | Ht 69.0 in | Wt 249.8 lb

## 2022-07-17 DIAGNOSIS — I4819 Other persistent atrial fibrillation: Secondary | ICD-10-CM

## 2022-07-17 DIAGNOSIS — D6869 Other thrombophilia: Secondary | ICD-10-CM

## 2022-07-17 NOTE — Patient Instructions (Signed)
Medication Instructions:  Your physician recommends that you continue on your current medications as directed. Please refer to the Current Medication list given to you today.  *If you need a refill on your cardiac medications before your next appointment, please call your pharmacy*   Lab Work: None ordered   Testing/Procedures: None ordered   Follow-Up: At [facility], you and your health needs are our priority.  As part of our continuing mission to provide you with exceptional heart care, we have created designated Provider Care Teams.  These Care Teams include your primary Cardiologist (physician) and Advanced Practice Providers (APPs -  Physician Assistants and Nurse Practitioners) who all work together to provide you with the care you need, when you need it.  Your next appointment:   1 year(s)  The format for your next appointment:   In Person  Provider:   Will Camnitz, MD    Thank you for choosing [facility]!!   Vella Colquitt, RN [phone]  Other Instructions   Important Information About Sugar           

## 2022-07-17 NOTE — Progress Notes (Signed)
Electrophysiology Office Note   Date:  07/17/2022   ID:  Jeffrey Griffin, DOB 30-Jul-1961, MRN [id number]  PCP:  Jeffrey Patience, DO  Cardiologist:  [place] Primary Electrophysiologist:  Jeffrey Madry Meredith Leeds, MD    Chief Complaint: AF   History of Present Illness: Jeffrey Griffin is a 61 y.o. male who is being seen today for the evaluation of atrial fibrillation at the request of Lilland, Alana, DO. Presenting today for electrophysiology evaluation. He is s/p AF ablation by Dr. Rayann Griffin in 07/2020. Briefly had intermittent AF post-procedure but has not noticed any further episodes since.   He has been doing well since last seen by PA Tillery. Has had numerous procedures this past year for his prostate Ca. Eliquis was briefly stopped for prostatectomy with development of blood clots and PE.   Today, he denies symptoms of palpitations, chest pain, shortness of breath, orthopnea, PND, lower extremity edema, claudication, dizziness, presyncope, syncope, bleeding, or neurologic sequela. The patient is tolerating medications without difficulties. He does have some decreased exercise tolerance but blames this on his many procedures this past year.  Past Medical History:  Diagnosis Date   Abnormality of thoracic aorta    4.3 CM ECTATIC ASENDING    Cardiopathy    CHF (congestive heart failure) ([facility])    SYSTOLIC   Epistaxis 5/95/6387   Hyperlipidemia    Hypertension    Lung nodule    Obesity    OSA (obstructive sleep apnea)    Persistent atrial fibrillation ([facility])    WITH RVR   Pleural effusion    RIGHT UPPER LOBE AND RIGHT LOWER LOBE    Past Surgical History:  Procedure Laterality Date   APPENDECTOMY     ATRIAL FIBRILLATION ABLATION N/A 07/16/2020   Procedure: ATRIAL FIBRILLATION ABLATION;  Surgeon: Jeffrey Grayer, MD;  Location: [facility];  Service: Cardiovascular;  Laterality: N/A;   CARDIOVERSION N/A 10/20/2019   Procedure: CARDIOVERSION;  Surgeon: Jeffrey Fredrickson Wonda Cheng, MD;   Location: [facility];  Service: Cardiovascular;  Laterality: N/A;   CARDIOVERSION N/A 05/31/2020   Procedure: CARDIOVERSION;  Surgeon: Jeffrey Hector, MD;  Location: [facility];  Service: Cardiovascular;  Laterality: N/A;   IR ANGIOGRAM PULMONARY BILATERAL SELECTIVE  03/09/2022   IR ANGIOGRAM SELECTIVE EACH ADDITIONAL VESSEL  03/09/2022   IR ANGIOGRAM SELECTIVE EACH ADDITIONAL VESSEL  03/09/2022   IR THROMBECT PRIM MECH INIT (INCLU) MOD SED  03/09/2022   IR US GUIDE VASC ACCESS RIGHT  03/09/2022   RADIOLOGY WITH ANESTHESIA N/A 03/09/2022   Procedure: IR WITH ANESTHESIA;  Surgeon: Radiologist, Medication, MD;  Location: [facility];  Service: Radiology;  Laterality: N/A;   VASECTOMY       Current Outpatient Medications  Medication Sig Dispense Refill   acetaminophen (TYLENOL) 500 MG tablet Take 500 mg by mouth in the morning, at noon, and at bedtime.     atorvastatin (LIPITOR) 40 MG tablet TAKE 1 TABLET BY MOUTH EVERY DAY 90 tablet 2   Carnitine-B5-B6 500-15-5 MG TABS Take 1 tablet by mouth daily.     Cholecalciferol (VITAMIN D3) 25 MCG (1000 UT) CAPS Take 1,000 Units by mouth daily.     Coenzyme Q10 300 MG CAPS Take 1 capsule by mouth daily.     Cyanocobalamin (B-12) 5000 MCG CAPS Take 1 capsule by mouth daily.     diclofenac Sodium (VOLTAREN) 1 % GEL Apply 4 g topically 4 (four) times daily. 350 g 0   ELIQUIS 5 MG TABS tablet TAKE 1 TABLET BY  MOUTH TWICE A DAY 852 tablet 2   Garlic 7782 MG CAPS Take 1 capsule by mouth daily.     Glucosamine-Chondroitin (COSAMIN DS PO) Take 2 tablets by mouth daily.     Hawthorn 150 MG CAPS Take 1 capsule by mouth daily at 6 (six) AM.     meclizine (ANTIVERT) 25 MG tablet TAKE 1 TABLET BY MOUTH 3 TIMES DAILY AS NEEDED FOR DIZZINESS. 90 tablet 1   metoprolol succinate (TOPROL-XL) 50 MG 24 hr tablet TAKE 1 TABLET BY MOUTH DAILY. TAKE WITH OR IMMEDIATELY FOLLOWING A MEAL. 90 tablet 3   Multiple Minerals-Vitamins (CAL MAG ZINC +D3 PO) Take 1 tablet by mouth daily.      Multiple Vitamin (MULTIVITAMIN WITH MINERALS) TABS tablet Take 1 tablet by mouth daily.     Omega-3 1400 MG CAPS Take 1 capsule by mouth daily.     sacubitril-valsartan (ENTRESTO) 97-103 MG Take 1 tablet by mouth 2 (two) times daily. 180 tablet 3   saline (AYR) GEL Place 1 Application into the nose every 4 (four) hours as needed (For nasal dryness).  0   Taurine 1000 MG CAPS Take 1 capsule by mouth daily.     vitamin E 180 MG (400 UNITS) capsule Take 400 Units by mouth daily.     oxybutynin (DITROPAN) 5 MG tablet Take 5 mg by mouth 3 (three) times daily.     polyethylene glycol powder (GLYCOLAX/MIRALAX) 17 GM/SCOOP powder Take 17 g by mouth daily. 238 g 0   No current facility-administered medications for this visit.    Allergies:   Patient has no known allergies.   Social History:  The patient  reports that he has never smoked. He has never used smokeless tobacco. He reports that he does not currently use alcohol. He reports that he does not use drugs.   Family History:  The patient's family history includes Atrial fibrillation in his brother; Hypertension in his father and sister; Hypertrophic cardiomyopathy in his mother; Stroke in his father.    ROS:  Please see the history of present illness.   Otherwise, review of systems is positive for none.   All other systems are reviewed and negative.    PHYSICAL EXAM: VS:  BP 126/88   Pulse 71   Ht '5\' 9"'$  (1.753 m)   Wt 249 lb 12.8 oz (113.3 kg)   SpO2 94%   BMI 36.89 kg/m  , BMI Body mass index is 36.89 kg/m. GEN: Well nourished, well developed, in no acute distress  HEENT: normal  Neck: no JVD, carotid bruits, or masses Cardiac: RRR; no murmurs, rubs, or gallops, trace R knee edema  Respiratory:  clear to auscultation bilaterally, normal work of breathing GI: soft, nontender, nondistended, + BS MS: no deformity or atrophy  Skin: warm and dry,  Neuro:  Strength and sensation are intact Psych: euthymic mood, full affect  EKG:  EKG  is ordered today. Personal review of the ekg ordered shows NSR at 71bpm  Recent Labs: 03/08/2022: ALT 30 03/12/2022: BUN 16; Creatinine, Ser 0.79; Potassium 4.0; Sodium 134 03/14/2022: Hemoglobin 11.0; Platelets 474    Lipid Panel     Component Value Date/Time   CHOL 150 03/30/2020 0742   TRIG 168 (H) 03/30/2020 0742   HDL 34 (L) 03/30/2020 0742   CHOLHDL 4.4 03/30/2020 0742   CHOLHDL 5.3 (H) 08/14/2015 1015   VLDL 28 08/14/2015 1015   LDLCALC 87 03/30/2020 0742     Wt Readings from Last 3 Encounters:  07/17/22 249 lb 12.8 oz (113.3 kg)  04/18/22 234 lb 9.6 oz (106.4 kg)  04/17/22 234 lb 6 oz (106.3 kg)      Other studies Reviewed: Additional studies/ records that were reviewed today include:  TTE 03/10/22 Review of the above records today demonstrates:   1. Left ventricular ejection fraction, by estimation, is 55 to 60%. The  left ventricle has normal function. The left ventricle has no regional  wall motion abnormalities. There is moderate left ventricular hypertrophy.  Left ventricular diastolic  parameters were normal.   2. Right ventricular systolic function is normal. The right ventricular  size is normal. Tricuspid regurgitation signal is inadequate for assessing  PA pressure.   3. The mitral valve is normal in structure. No evidence of mitral valve  regurgitation. No evidence of mitral stenosis.   4. The aortic valve is tricuspid. Aortic valve regurgitation is not  visualized. No aortic stenosis is present.   5. Aortic dilatation noted. There is dilatation of the aortic root,  measuring 42 mm. There is dilatation of the ascending aorta, measuring 41  mm.   6. The inferior vena cava is normal in size with greater than 50%  respiratory variability, suggesting right atrial pressure of 3 mmHg.    ASSESSMENT AND PLAN:  1.  AF S/p ablation in 07/2020 with Dr. Rayann Griffin Doing well since then from AF perspective, recovered EF CHA2DS2-VASc Score = 3 (CHF, HTN, Vasc  Dz) AC with eliquis Cont metop XL '50mg'$  daily  2. OSA - continue BiPAP  HTN - well controlled on current regimen HFrecEF - denies s/s of worsening HF, cont toprol and entresto Thoracic Aortic aneurysm - recently CT scan as part of prostate workup.    Current medicines are reviewed at length with the patient today.   The patient does not have concerns regarding his medicines.  The following changes were made today:  none  Labs/ tests ordered today include: none Orders Placed This Encounter  Procedures   EKG 12-Lead     Disposition:   FU 1 year  Signed, Deyonna Fitzsimmons Meredith Leeds, MD  07/17/2022 9:57 AM     [facility] [address] [place] [address] [phone] (office) [phone] (fax)  I have seen and examined this patient with Mamie Levers.  Agree with above, note added to reflect my findings.  Patient presents today feeling well.  He is noted no further episodes of atrial fibrillation.  His major issue is recent prostate surgery and pulmonary embolism.  He was taken off of his Eliquis around the time of the surgery and developed clots and PE requiring thrombectomy.  He is slowly improving from this.  He is ready to get back to exercising.  GEN: Well nourished, well developed, in no acute distress  HEENT: normal  Neck: no JVD, carotid bruits, or masses Cardiac: RRR; no murmurs, rubs, or gallops,no edema  Respiratory:  clear to auscultation bilaterally, normal work of breathing GI: soft, nontender, nondistended, + BS MS: no deformity or atrophy  Skin: warm and dry Neuro:  Strength and sensation are intact Psych: euthymic mood, full affect   Persistent atrial fibrillation: Status post ablation in 2021.  CHA2DS2-VASc of 3.  Currently on Eliquis and metoprolol.  No further episodes of atrial fibrillation.  Continue with current management. Secondary hypercoagulable state: Currently on Eliquis for atrial fibrillation as above Pulmonary embolism:  Karie Skowron likely need lifelong anticoagulation.  He Priyal Musquiz discuss with his primary physician whether or not hypercoagulable work-up  is necessary.  Tabbatha Bordelon M. Rod Majerus MD 07/17/2022 9:57 AM

## 2022-07-19 ENCOUNTER — Ambulatory Visit: Payer: No Typology Code available for payment source

## 2022-07-19 DIAGNOSIS — M25521 Pain in right elbow: Secondary | ICD-10-CM | POA: Diagnosis not present

## 2022-07-19 DIAGNOSIS — G8929 Other chronic pain: Secondary | ICD-10-CM

## 2022-07-19 NOTE — Therapy (Signed)
OUTPATIENT PHYSICAL THERAPY TREATMENT NOTE   Patient Name: Jeffrey Griffin MRN: [id number] DOB:[birth year], 61 y.o., male Today's Date: 07/20/2022  PCP: [name], DO REFERRING PROVIDER: Thurman Coyer, DO; Velora Mediate, MD    END OF SESSION:   PT End of Session - 07/19/22 1813     Visit Number 12    Number of Visits 14    Date for PT Re-Evaluation 08/11/22    Authorization Type [place] PREFERRED    PT Start Time 1830    PT Stop Time 1910    PT Time Calculation (min) 40 min    Activity Tolerance Patient tolerated treatment well    Behavior During Therapy WFL for tasks assessed/performed                    Past Medical History:  Diagnosis Date   Abnormality of thoracic aorta    4.3 CM ECTATIC ASENDING    Cardiopathy    CHF (congestive heart failure) ([place])    SYSTOLIC   Epistaxis 03/29/3663   Hyperlipidemia    Hypertension    Lung nodule    Obesity    OSA (obstructive sleep apnea)    Persistent atrial fibrillation ([place])    WITH RVR   Pleural effusion    RIGHT UPPER LOBE AND RIGHT LOWER LOBE    Past Surgical History:  Procedure Laterality Date   APPENDECTOMY     ATRIAL FIBRILLATION ABLATION N/A 07/16/2020   Procedure: ATRIAL FIBRILLATION ABLATION;  Surgeon: [name], MD;  Location: [facility];  Service: Cardiovascular;  Laterality: N/A;   CARDIOVERSION N/A 10/20/2019   Procedure: CARDIOVERSION;  Surgeon: Acie Fredrickson Wonda Cheng, MD;  Location: [facility];  Service: Cardiovascular;  Laterality: N/A;   CARDIOVERSION N/A 05/31/2020   Procedure: CARDIOVERSION;  Surgeon: Josue Hector, MD;  Location: [facility];  Service: Cardiovascular;  Laterality: N/A;   IR ANGIOGRAM PULMONARY BILATERAL SELECTIVE  03/09/2022   IR ANGIOGRAM SELECTIVE EACH ADDITIONAL VESSEL  03/09/2022   IR ANGIOGRAM SELECTIVE EACH ADDITIONAL VESSEL  03/09/2022   IR THROMBECT PRIM MECH INIT (INCLU) MOD SED  03/09/2022   IR US GUIDE VASC ACCESS RIGHT  03/09/2022   RADIOLOGY WITH  ANESTHESIA N/A 03/09/2022   Procedure: IR WITH ANESTHESIA;  Surgeon: Radiologist, Medication, MD;  Location: [facility];  Service: Radiology;  Laterality: N/A;   VASECTOMY     Patient Active Problem List   Diagnosis Date Noted   Knee pain, bilateral 03/12/2022   Pulmonary embolism ([place]) 03/09/2022   OSA (obstructive sleep apnea) 03/09/2022   PE (pulmonary thromboembolism) ([place]) 03/09/2022   S/P prostatectomy    Paroxysmal atrial fibrillation ([facility])    Fatigue 03/04/2021   Subcutaneous nodule of left foot 03/04/2021   Atypical atrial flutter ([place]) 05/26/2020   Secondary hypercoagulable state ([place]) 04/13/2020   Chronic combined systolic and diastolic CHF (congestive heart failure) ([place]) 04/06/2020   Persistent atrial fibrillation ([place]) 10/01/2019   Pulmonary embolus ([place]) 10/01/2019    REFERRING DIAG: M25.521 (ICD-10-CM) - Right elbow pain; Biceps tendonitis right; L shoulder OA and RC tendinopathy  THERAPY DIAG:  Pain in right elbow  Chronic left shoulder pain  Rationale for Evaluation and Treatment Rehabilitation  PERTINENT HISTORY: CHF, Afib  PRECAUTIONS: None  SUBJECTIVE:  Pt presents to PT with reports of decreased shoulder and elbow pain. Has continued HEP compliance with no adverse effect. Pt is ready to begin PT at this time.   PAIN: L Shoulder Are you having pain?  4/10,  end range/pain arc.otherwise 0/10 Yes: NPRS scale: Prior injection  2+ weeks ago 0/10 Pain location:L shoulder Pain description: ache and sharp Aggravating factors: Raising the the L arm, waking up with his L arm above his head, liftting weight [place] Relieving factors: rest and support, ice pack, voltaren gel  PAIN: R elbow Are you having pain? 1-2/10  c resisted flexion other wise 0/10 Yes: NPRS scale: 0-1/10 L shoulder abd Pain location: R elbow Pain description: ache and sharp Aggravating factors: Elbow extension and flexion end range, certain r arm movements Relieving factors: rest and support, ice  pack Pain range on eval: 0-3/10   OBJECTIVE: (objective measures completed at initial evaluation unless otherwise dated)  PATIENT SURVEYS:  FOTO Perceived function 62%, predicted 73% FOTO 92% -FOTO DISCHARGED FOR RIGHT UE   UPPER EXTREMITY ROM:                        WNLs Active ROM Right Eval 04/21/22 Left 05/11/22  Shoulder flexion   WNLs pain only at end range  Shoulder extension    WNLs   Shoulder abduction    WNLs pain only at end range  Shoulder adduction      Shoulder internal rotation   T9 Painful  Shoulder external rotation Pain at end range T4   Elbow flexion Pain at end range    Elbow extension      Wrist flexion      Wrist extension      Wrist ulnar deviation      Wrist radial deviation      Wrist pronation      Wrist supination Pain at end range    (Blank rows = not tested)   UPPER EXTREMITY MMT:   MMT Right Eval 04/21/22 Left Eval 05/11/22 Right 05/18/22 Left 05/18/22  Shoulder flexion 5  5    Shoulder extension 5  5    Shoulder abduction 5  5    Shoulder adduction 5  5    Shoulder internal rotation 5, pain  5    Shoulder external rotation 5  5    Middle trapezius        Lower trapezius        Elbow flexion 5, pain  5    Elbow extension 5  5    Wrist flexion 5, pain      Wrist extension 5      Wrist ulnar deviation 5      Wrist radial deviation 5      Wrist pronation 5      Wrist supination 5      Grip strength (lbs) TBA TBA 120 lb 108 lb  (Blank rows = not tested)   SHOULDER SPECIAL TESTS:  05/11/22 Hawkins-Kennedy: Pos; Empty can: Neg; Full can: Neg. Crank test: Pos for pain, but not clicking and popping             04/21/22: Biceps assessment: Yergason's test: negative and Speed's test: negative   JOINT MOBILITY TESTING:  Medial and lateral elbow ligament stress testing negative   PALPATION:  TTP to the distal aspect and insertion of the bicep tendon  TODAY'S TREATMENT:  [facility] Adult PT Treatment:                                                 DATE:  07/19/22 Therapeutic Exercise: UBE L 2.5 x 2 min each while taking subjective Shoulder row Cables 33#  3x15 Shoulder L IR/ER 2x15 10# Shoulder ext Cables 33# 2x10 S/L ER 3x10 4# L  S/L shoulder abd 2x10 5# Body blade IR/ER 2x30" each Body blade flex/ext 90 each Serratus punch 2x10 5# each  [facility] Adult PT Treatment:                                                DATE: 07/12/22 Therapeutic Exercise: UBE L2.5 x 2 min each  Shoulder row Cables 33#  2x10 Shoulder L IR/ER 2x15 10# Shoulder ext Cables 33# 2x10 Body blade IR/ER 2x30" L Body blade flex/ext 90 L Wrist curl bar 2x30" flex/ext Serratus punch 2x10 5# each Pronation/supination 2x10 8# DB   [facility] Adult PT Treatment:                                                DATE: 06/21/22 Therapeutic Exercise: UBE L2.5 x 2 min each  Shoulder row Cables 17#  2x10 Shoulder L ER red band 10 x 2  Shoulder L IR red band 10 x 2 Shoulder ext Cables 17# 2x10 Shoulder chest press 10# cable 2 x 10  Shoulder flexion 3# standing to 90d Shoulder scaption 3# standing to 90d R Hammer to overhead 8# 2 x 10 Bilat Bicep Curl 10# 10 x 2  Pronation/supination 2x10 6# DB   [facility] Adult PT Treatment:                                                DATE: 05/24/22 Therapeutic Exercise: UBE L2.5 x 2 min each  Shoulder row Cables 20#  2x10 Shoulder L ER red band 10 x 2  Shoulder L IR red band 10 x 2 Shoulder ext Cables 20# 2x10 Shoulder chest press 10# cable 2 x 10  Shoulder flexion 3# standing L shoulder clocks at 90 degrees flexion x2 5# S/L Left shoulder abduction 3# 10 x 2  Seated L OH press 5#  2x10 R Hammer to overhead 10# 2 x 10 Bilat Bicep Curl 15# 10 x 2  Pronation/supination 2x10 6# DB R  PATIENT EDUCATION: Education details: HEP, CFM Person educated: Patient Education method: Explanation, Demonstration, Tactile cues, and Verbal cues Education comprehension: verbalized understanding, returned demonstration, verbal cues required, and  tactile cues required    HOME EXERCISE PROGRAM: Access Code: [email or web address] URL: [email or web address] Date: 04/25/2022 Prepared by: Octavio Manns  Exercises - Seated Elbow Flexion and Extension AROM  - 1 x daily - 7 x weekly - 3 sets - 10 reps - Forearm Pronation and Supination with Hammer  - 1 x daily - 7 x weekly - 3 sets - 10 reps - Standing Bicep Stretch at [place]  - 1 x daily - 7 x weekly - 2-3 reps - 30 sec hold  Date: 05/11/2022 Added and prepared: by Gar Ponto  - Standing Shoulder Row with Anchored Resistance  - 1 x daily - 7 x weekly - 2-3 sets - 10 reps - 2 hold - Shoulder extension with resistance -  Neutral  - 1 x daily - 7 x weekly - 2-3 sets - 10 reps - 2 hold -added 05/18/22 - Sidelying Shoulder Abduction Palm Forward  - 1 x daily - 7 x weekly - 2-3 sets - 10 reps - Sidelying Shoulder ER with Towel and Dumbbell  - 1 x daily - 7 x weekly - 2-3 sets - 10 reps   ASSESSMENT:   CLINICAL IMPRESSION: Pt was able to complete all prescribed exercises, focusing on improving L shoulder RTC and periscapular strength as well as decreasing pain in R elbow/forearm. Responded well to therapy, noting decreased pain post session. Continues to benefit from skilled PT, will progress as tolerated per POC.    OBJECTIVE IMPAIRMENTS increased fascial restrictions, impaired UE functional use, and pain.   ACTIVITY LIMITATIONS carrying, lifting, and reach over head   PARTICIPATION LIMITATIONS: cleaning and occupation   PERSONAL FACTORS Past/current experiences, Profession, and Time since onset of injury/illness/exacerbation are also affecting patient's functional outcome.      GOALS:   SHORT TERM GOALS = LTGs     LONG TERM GOALS: Target date: 08/11/22    Pt will be Ind in a final HEP to maintain achieved LOF and QOL Baseline: not started Goal status: MET   2.  Pt will voice understanding of measures to assist in pain reduction  Baseline: started Goal status: MET   3.  Pt's  FOTO score will improved to the predicted value of 73% as indication of improved function Baseline: 62% Status: 92% RUE, no survey for LUE Goal status: MET   4.  Pt will report a decrease in R elbow pain to 4/10 or less with daily and work related activities for improved R UE function Baseline: 0-9/10 Status: 05/16/22:Pain level under 4/10 for elbow, average 0/10. Goal status: MET   5.  Pt will report no difficulty with lifting 10# overhead with either UE as indication of improved function with less pain Baseline: Pt reports some difficulty Status:06/20/22 = pain c L shoulder c more weakness L > R Goal status: Ongoing  6. Pt will report an improved pain range for the L shoulder with daily and work activities to 0-5/10.  Baseline: 2-9/10 Status: 06/20/22=4/10 near end range/painful arc Goal status: Improving  7. Pt will report good tolerance of both Upper Extremities with physical demands of his new job  Baseline: TBD  Goal Status: Initial   PLAN: PT FREQUENCY: 2x/week   PT DURATION: 4 weeks   PLANNED INTERVENTIONS: Therapeutic exercises, Therapeutic activity, Patient/Family education, Self Care, Joint mobilization, Dry Needling, Cryotherapy, Moist heat, Taping, Ultrasound, Ionotophoresis 92m/ml Dexamethasone, Manual therapy, and Re-evaluation   PLAN FOR NEXT SESSION:  L Shoulder strength/functional strengthening. R elbow and L shoulder strengthening.   DWard ChattersPT  07/20/22 8:54 AM

## 2022-07-26 ENCOUNTER — Ambulatory Visit: Payer: No Typology Code available for payment source

## 2022-07-26 DIAGNOSIS — M25521 Pain in right elbow: Secondary | ICD-10-CM

## 2022-07-26 DIAGNOSIS — G8929 Other chronic pain: Secondary | ICD-10-CM

## 2022-07-26 NOTE — Therapy (Signed)
OUTPATIENT PHYSICAL THERAPY TREATMENT NOTE   Patient Name: Jeffrey Griffin MRN: [id number] DOB:Jul 18, 1961, 61 y.o., male Today's Date: 07/26/2022  PCP: [name], DO REFERRING PROVIDER: Thurman Coyer, DO; Velora Mediate, MD    END OF SESSION:   PT End of Session - 07/26/22 1759     Visit Number 13    Number of Visits 14    Date for PT Re-Evaluation 08/11/22    Authorization Type [place] PREFERRED    PT Start Time 1800    PT Stop Time 1840    PT Time Calculation (min) 40 min    Activity Tolerance Patient tolerated treatment well    Behavior During Therapy WFL for tasks assessed/performed                     Past Medical History:  Diagnosis Date   Abnormality of thoracic aorta    4.3 CM ECTATIC ASENDING    Cardiopathy    CHF (congestive heart failure) ([place])    SYSTOLIC   Epistaxis 9/48/5462   Hyperlipidemia    Hypertension    Lung nodule    Obesity    OSA (obstructive sleep apnea)    Persistent atrial fibrillation (H[facility])    WITH RVR   Pleural effusion    RIGHT UPPER LOBE AND RIGHT LOWER LOBE    Past Surgical History:  Procedure Laterality Date   APPENDECTOMY     ATRIAL FIBRILLATION ABLATION N/A 07/16/2020   Procedure: ATRIAL FIBRILLATION ABLATION;  Surgeon: [name], MD;  Location: [facility];  Service: Cardiovascular;  Laterality: N/A;   CARDIOVERSION N/A 10/20/2019   Procedure: CARDIOVERSION;  Surgeon: Acie Fredrickson Wonda Cheng, MD;  Location: [facility];  Service: Cardiovascular;  Laterality: N/A;   CARDIOVERSION N/A 05/31/2020   Procedure: CARDIOVERSION;  Surgeon: Josue Hector, MD;  Location: [facility];  Service: Cardiovascular;  Laterality: N/A;   IR ANGIOGRAM PULMONARY BILATERAL SELECTIVE  03/09/2022   IR ANGIOGRAM SELECTIVE EACH ADDITIONAL VESSEL  03/09/2022   IR ANGIOGRAM SELECTIVE EACH ADDITIONAL VESSEL  03/09/2022   IR THROMBECT PRIM MECH INIT (INCLU) MOD SED  03/09/2022   IR US GUIDE VASC ACCESS RIGHT  03/09/2022   RADIOLOGY  WITH ANESTHESIA N/A 03/09/2022   Procedure: IR WITH ANESTHESIA;  Surgeon: Radiologist, Medication, MD;  Location: [facility];  Service: Radiology;  Laterality: N/A;   VASECTOMY     Patient Active Problem List   Diagnosis Date Noted   Knee pain, bilateral 03/12/2022   Pulmonary embolism ([place]) 03/09/2022   OSA (obstructive sleep apnea) 03/09/2022   PE (pulmonary thromboembolism) ([place]) 03/09/2022   S/P prostatectomy    Paroxysmal atrial fibrillation ([facility])    Fatigue 03/04/2021   Subcutaneous nodule of left foot 03/04/2021   Atypical atrial flutter ([place]) 05/26/2020   Secondary hypercoagulable state ([place]) 04/13/2020   Chronic combined systolic and diastolic CHF (congestive heart failure) ([place]) 04/06/2020   Persistent atrial fibrillation ([place]) 10/01/2019   Pulmonary embolus ([place]) 10/01/2019    REFERRING DIAG: M25.521 (ICD-10-CM) - Right elbow pain; Biceps tendonitis right; L shoulder OA and RC tendinopathy  THERAPY DIAG:  Pain in right elbow  Chronic left shoulder pain  Rationale for Evaluation and Treatment Rehabilitation  PERTINENT HISTORY: CHF, Afib  PRECAUTIONS: None  SUBJECTIVE:  Pt presents to PT with reports of continued elbow pain, notes shoulder is doing well. Has been compliant with HEP with no adverse effect. Pt is ready to begin PT at this time.   PAIN: L Shoulder Are  you having pain?  0/10, end range/pain arc.otherwise 0/10 Yes: NPRS scale: Prior injection  2+ weeks ago 0/10 Pain location:L shoulder Pain description: ache and sharp Aggravating factors: Raising the the L arm, waking up with his L arm above his head, liftting weight [place] Relieving factors: rest and support, ice pack, voltaren gel  PAIN: R elbow Are you having pain? 5/10  c resisted flexion other wise 0/10 Yes: NPRS scale: 0-1/10 L shoulder abd Pain location: R elbow Pain description: ache and sharp Aggravating factors: Elbow extension and flexion end range, certain r arm movements Relieving factors:  rest and support, ice pack Pain range on eval: 0-3/10   OBJECTIVE: (objective measures completed at initial evaluation unless otherwise dated)  PATIENT SURVEYS:  FOTO Perceived function 62%, predicted 73% FOTO 92% -FOTO DISCHARGED FOR RIGHT UE   UPPER EXTREMITY ROM:                        WNLs Active ROM Right Eval 04/21/22 Left 05/11/22  Shoulder flexion   WNLs pain only at end range  Shoulder extension    WNLs   Shoulder abduction    WNLs pain only at end range  Shoulder adduction      Shoulder internal rotation   T9 Painful  Shoulder external rotation Pain at end range T4   Elbow flexion Pain at end range    Elbow extension      Wrist flexion      Wrist extension      Wrist ulnar deviation      Wrist radial deviation      Wrist pronation      Wrist supination Pain at end range    (Blank rows = not tested)   UPPER EXTREMITY MMT:   MMT Right Eval 04/21/22 Left Eval 05/11/22 Right 05/18/22 Left 05/18/22  Shoulder flexion 5  5    Shoulder extension 5  5    Shoulder abduction 5  5    Shoulder adduction 5  5    Shoulder internal rotation 5, pain  5    Shoulder external rotation 5  5    Middle trapezius        Lower trapezius        Elbow flexion 5, pain  5    Elbow extension 5  5    Wrist flexion 5, pain      Wrist extension 5      Wrist ulnar deviation 5      Wrist radial deviation 5      Wrist pronation 5      Wrist supination 5      Grip strength (lbs) TBA TBA 120 lb 108 lb  (Blank rows = not tested)   SHOULDER SPECIAL TESTS:  05/11/22 Hawkins-Kennedy: Pos; Empty can: Neg; Full can: Neg. Crank test: Pos for pain, but not clicking and popping             04/21/22: Biceps assessment: Yergason's test: negative and Speed's test: negative   JOINT MOBILITY TESTING:  Medial and lateral elbow ligament stress testing negative   PALPATION:  TTP to the distal aspect and insertion of the bicep tendon  TODAY'S TREATMENT:  [facility] Adult PT Treatment:  DATE: 07/26/2022 Therapeutic Exercise: UBE L 2.5 x 2 min each while taking subjective Shoulder row Cables 33#  3x15 Shoulder L IR/ER 2x15 10# Standing Y/Fly 3x10 7# Prone Y/T 2x10 3# each hand S/L ER 2x15 5# each Manual Therapy: CFM to R distal bicep  [facility] Adult PT Treatment:                                                DATE: 07/19/22 Therapeutic Exercise: UBE L 2.5 x 2 min each while taking subjective Shoulder row Cables 33#  3x15 Shoulder L IR/ER 2x15 10# Shoulder ext Cables 33# 2x10 S/L ER 3x10 4# L  S/L shoulder abd 2x10 5# Body blade IR/ER 2x30" each Body blade flex/ext 90 each Serratus punch 2x10 5# each  [facility] Adult PT Treatment:                                                DATE: 07/12/22 Therapeutic Exercise: UBE L2.5 x 2 min each  Shoulder row Cables 33#  2x10 Shoulder L IR/ER 2x15 10# Shoulder ext Cables 33# 2x10 Body blade IR/ER 2x30" L Body blade flex/ext 90 L Wrist curl bar 2x30" flex/ext Serratus punch 2x10 5# each Pronation/supination 2x10 8# DB   PATIENT EDUCATION: Education details: HEP, CFM Person educated: Patient Education method: Explanation, Demonstration, Tactile cues, and Verbal cues Education comprehension: verbalized understanding, returned demonstration, verbal cues required, and tactile cues required    HOME EXERCISE PROGRAM: Access Code: [email or web address] URL: [email or web address] Date: 04/25/2022 Prepared by: Octavio Manns  Exercises - Seated Elbow Flexion and Extension AROM  - 1 x daily - 7 x weekly - 3 sets - 10 reps - Forearm Pronation and Supination with Hammer  - 1 x daily - 7 x weekly - 3 sets - 10 reps - Standing Bicep Stretch at [place]  - 1 x daily - 7 x weekly - 2-3 reps - 30 sec hold  Date: 05/11/2022 Added and prepared: by Gar Ponto  - Standing Shoulder Row with Anchored Resistance  - 1 x daily - 7 x weekly - 2-3 sets - 10 reps - 2 hold - Shoulder extension with resistance - Neutral  - 1 x  daily - 7 x weekly - 2-3 sets - 10 reps - 2 hold -added 05/18/22 - Sidelying Shoulder Abduction Palm Forward  - 1 x daily - 7 x weekly - 2-3 sets - 10 reps - Sidelying Shoulder ER with Towel and Dumbbell  - 1 x daily - 7 x weekly - 2-3 sets - 10 reps   ASSESSMENT:   CLINICAL IMPRESSION: Pt was able to complete all prescribed exercises with no adverse effect. Therapy progressed middle/lower trap strengthening, with pt noting fatigue post session. Pt is progressing well with therapy, will continue per POC.   OBJECTIVE IMPAIRMENTS increased fascial restrictions, impaired UE functional use, and pain.   ACTIVITY LIMITATIONS carrying, lifting, and reach over head   PARTICIPATION LIMITATIONS: cleaning and occupation   PERSONAL FACTORS Past/current experiences, Profession, and Time since onset of injury/illness/exacerbation are also affecting patient's functional outcome.      GOALS:   SHORT TERM GOALS = LTGs     LONG TERM GOALS: Target date: 08/11/22    Pt will  be Ind in a final HEP to maintain achieved LOF and QOL Baseline: not started Goal status: MET   2.  Pt will voice understanding of measures to assist in pain reduction  Baseline: started Goal status: MET   3.  Pt's FOTO score will improved to the predicted value of 73% as indication of improved function Baseline: 62% Status: 92% RUE, no survey for LUE Goal status: MET   4.  Pt will report a decrease in R elbow pain to 4/10 or less with daily and work related activities for improved R UE function Baseline: 0-9/10 Status: 05/16/22:Pain level under 4/10 for elbow, average 0/10. Goal status: MET   5.  Pt will report no difficulty with lifting 10# overhead with either UE as indication of improved function with less pain Baseline: Pt reports some difficulty Status:06/20/22 = pain c L shoulder c more weakness L > R Goal status: Ongoing  6. Pt will report an improved pain range for the L shoulder with daily and work activities  to 0-5/10.  Baseline: 2-9/10 Status: 06/20/22=4/10 near end range/painful arc Goal status: Improving  7. Pt will report good tolerance of both Upper Extremities with physical demands of his new job  Baseline: TBD  Goal Status: Initial   PLAN: PT FREQUENCY: 2x/week   PT DURATION: 4 weeks   PLANNED INTERVENTIONS: Therapeutic exercises, Therapeutic activity, Patient/Family education, Self Care, Joint mobilization, Dry Needling, Cryotherapy, Moist heat, Taping, Ultrasound, Ionotophoresis 20m/ml Dexamethasone, Manual therapy, and Re-evaluation   PLAN FOR NEXT SESSION:  L Shoulder strength/functional strengthening. R elbow and L shoulder strengthening.   DWard ChattersPT  07/26/22 6:44 PM

## 2022-08-02 ENCOUNTER — Ambulatory Visit: Payer: No Typology Code available for payment source

## 2022-08-02 DIAGNOSIS — M25521 Pain in right elbow: Secondary | ICD-10-CM | POA: Diagnosis not present

## 2022-08-02 DIAGNOSIS — G8929 Other chronic pain: Secondary | ICD-10-CM

## 2022-08-02 NOTE — Therapy (Signed)
OUTPATIENT PHYSICAL THERAPY TREATMENT NOTE   Patient Name: Jeffrey Griffin MRN: [id number] DOB:02-16-1961, 61 y.o., male Today's Date: 08/03/2022  PCP: [name], DO REFERRING PROVIDER: Thurman Coyer, DO; Velora Mediate, MD    END OF SESSION:   PT End of Session - 08/02/22 1811     Visit Number 14    Number of Visits 14    Date for PT Re-Evaluation 08/11/22    Authorization Type [place] PREFERRED    PT Start Time 0938    PT Stop Time 1851    PT Time Calculation (min) 40 min    Activity Tolerance Patient tolerated treatment well    Behavior During Therapy WFL for tasks assessed/performed                      Past Medical History:  Diagnosis Date   Abnormality of thoracic aorta    4.3 CM ECTATIC ASENDING    Cardiopathy    CHF (congestive heart failure) ([place])    SYSTOLIC   Epistaxis 1/82/9937   Hyperlipidemia    Hypertension    Lung nodule    Obesity    OSA (obstructive sleep apnea)    Persistent atrial fibrillation ([place])    WITH RVR   Pleural effusion    RIGHT UPPER LOBE AND RIGHT LOWER LOBE    Past Surgical History:  Procedure Laterality Date   APPENDECTOMY     ATRIAL FIBRILLATION ABLATION N/A 07/16/2020   Procedure: ATRIAL FIBRILLATION ABLATION;  Surgeon: [name], MD;  Location: [place] [facility];  Service: Cardiovascular;  Laterality: N/A;   CARDIOVERSION N/A 10/20/2019   Procedure: CARDIOVERSION;  Surgeon: Acie Fredrickson Wonda Cheng, MD;  Location: [place];  Service: Cardiovascular;  Laterality: N/A;   CARDIOVERSION N/A 05/31/2020   Procedure: CARDIOVERSION;  Surgeon: Josue Hector, MD;  Location: [place];  Service: Cardiovascular;  Laterality: N/A;   IR ANGIOGRAM PULMONARY BILATERAL SELECTIVE  03/09/2022   IR ANGIOGRAM SELECTIVE EACH ADDITIONAL VESSEL  03/09/2022   IR ANGIOGRAM SELECTIVE EACH ADDITIONAL VESSEL  03/09/2022   IR THROMBECT PRIM MECH INIT (INCLU) MOD SED  03/09/2022   IR US GUIDE VASC ACCESS RIGHT  03/09/2022   RADIOLOGY  WITH ANESTHESIA N/A 03/09/2022   Procedure: IR WITH ANESTHESIA;  Surgeon: Radiologist, Medication, MD;  Location: [place];  Service: Radiology;  Laterality: N/A;   VASECTOMY     Patient Active Problem List   Diagnosis Date Noted   Knee pain, bilateral 03/12/2022   Pulmonary embolism ([place]) 03/09/2022   OSA (obstructive sleep apnea) 03/09/2022   PE (pulmonary thromboembolism) ([place]) 03/09/2022   S/P prostatectomy    Paroxysmal atrial fibrillation ([facility])    Fatigue 03/04/2021   Subcutaneous nodule of left foot 03/04/2021   Atypical atrial flutter ([place]) 05/26/2020   Secondary hypercoagulable state ([facility]) 04/13/2020   Chronic combined systolic and diastolic CHF (congestive heart failure) ([place]) 04/06/2020   Persistent atrial fibrillation ([place]) 10/01/2019   Pulmonary embolus ([place]) 10/01/2019    REFERRING DIAG: M25.521 (ICD-10-CM) - Right elbow pain; Biceps tendonitis right; L shoulder OA and RC tendinopathy  THERAPY DIAG:  Pain in right elbow  Chronic left shoulder pain  Rationale for Evaluation and Treatment Rehabilitation  PERTINENT HISTORY: CHF, Afib  PRECAUTIONS: None  SUBJECTIVE: Pt presents to PT with reports of decreased elbow and shoulder pain. Has been compliant with HEP. Is ready to begin PT at this time.  PAIN: L Shoulder Are you having pain?  0/10, end range/pain arc.otherwise 0/10  Yes: NPRS scale: Prior injection  2+ weeks ago 0/10 Pain location:L shoulder Pain description: ache and sharp Aggravating factors: Raising the the L arm, waking up with his L arm above his head, liftting weight [place] Relieving factors: rest and support, ice pack, voltaren gel  PAIN: R elbow Are you having pain? 3/10  c resisted flexion other wise 0/10 Yes: NPRS scale: 0-1/10 L shoulder abd Pain location: R elbow Pain description: ache and sharp Aggravating factors: Elbow extension and flexion end range, certain r arm movements Relieving factors: rest and support, ice pack Pain range on eval:  0-3/10   OBJECTIVE: (objective measures completed at initial evaluation unless otherwise dated)  PATIENT SURVEYS:  FOTO Perceived function 62%, predicted 73% FOTO 92% -FOTO DISCHARGED FOR RIGHT UE   UPPER EXTREMITY ROM:                        WNLs Active ROM Right Eval 04/21/22 Left 05/11/22  Shoulder flexion   WNLs pain only at end range  Shoulder extension    WNLs   Shoulder abduction    WNLs pain only at end range  Shoulder adduction      Shoulder internal rotation   T9 Painful  Shoulder external rotation Pain at end range T4   Elbow flexion Pain at end range    Elbow extension      Wrist flexion      Wrist extension      Wrist ulnar deviation      Wrist radial deviation      Wrist pronation      Wrist supination Pain at end range    (Blank rows = not tested)   UPPER EXTREMITY MMT:   MMT Right Eval 04/21/22 Left Eval 05/11/22 Right 05/18/22 Left 05/18/22  Shoulder flexion 5  5    Shoulder extension 5  5    Shoulder abduction 5  5    Shoulder adduction 5  5    Shoulder internal rotation 5, pain  5    Shoulder external rotation 5  5    Middle trapezius        Lower trapezius        Elbow flexion 5, pain  5    Elbow extension 5  5    Wrist flexion 5, pain      Wrist extension 5      Wrist ulnar deviation 5      Wrist radial deviation 5      Wrist pronation 5      Wrist supination 5      Grip strength (lbs) TBA TBA 120 lb 108 lb  (Blank rows = not tested)   SHOULDER SPECIAL TESTS:  05/11/22 Hawkins-Kennedy: Pos; Empty can: Neg; Full can: Neg. Crank test: Pos for pain, but not clicking and popping             04/21/22: Biceps assessment: Yergason's test: negative and Speed's test: negative   JOINT MOBILITY TESTING:  Medial and lateral elbow ligament stress testing negative   PALPATION:  TTP to the distal aspect and insertion of the bicep tendon  TODAY'S TREATMENT:  [facility] Adult PT Treatment:                                                DATE:  08/02/2022 Therapeutic Exercise: UBE  L 2.5 x 2 min each while taking subjective Shoulder row Cables 33#  3x15 Shoulder L IR/ER 2x15 10# Prone Y/T 2x10 3# each hand Prone W with elevation x 10  S/L ER 2x15 5# each Standing Y 5# each 3x10 Shoulder IR/ER 2x30" body blade   [facility] Adult PT Treatment:                                                DATE: 07/26/2022 Therapeutic Exercise: UBE L 2.5 x 2 min each while taking subjective Shoulder row Cables 33#  3x15 Shoulder L IR/ER 2x15 10# Standing Y/Fly 3x10 7# Prone Y/T 2x10 3# each hand S/L ER 2x15 5# each Manual Therapy: CFM to R distal bicep  [facility] Adult PT Treatment:                                                DATE: 07/19/22 Therapeutic Exercise: UBE L 2.5 x 2 min each while taking subjective Shoulder row Cables 33#  3x15 Shoulder L IR/ER 2x15 10# Shoulder ext Cables 33# 2x10 S/L ER 3x10 4# L  S/L shoulder abd 2x10 5# Body blade IR/ER 2x30" each Body blade flex/ext 90 each Serratus punch 2x10 5# each  [facility] Adult PT Treatment:                                                DATE: 07/12/22 Therapeutic Exercise: UBE L2.5 x 2 min each  Shoulder row Cables 33#  2x10 Shoulder L IR/ER 2x15 10# Shoulder ext Cables 33# 2x10 Body blade IR/ER 2x30" L Body blade flex/ext 90 L Wrist curl bar 2x30" flex/ext Serratus punch 2x10 5# each Pronation/supination 2x10 8# DB   PATIENT EDUCATION: Education details: HEP, CFM Person educated: Patient Education method: Explanation, Demonstration, Tactile cues, and Verbal cues Education comprehension: verbalized understanding, returned demonstration, verbal cues required, and tactile cues required    HOME EXERCISE PROGRAM: Access Code: [email or web address] URL: [email or web address] Date: 04/25/2022 Prepared by: Octavio Manns  Exercises - Seated Elbow Flexion and Extension AROM  - 1 x daily - 7 x weekly - 3 sets - 10 reps - Forearm Pronation and Supination with Hammer  - 1 x daily - 7  x weekly - 3 sets - 10 reps - Standing Bicep Stretch at [place]  - 1 x daily - 7 x weekly - 2-3 reps - 30 sec hold  Date: 05/11/2022 Added and prepared: by Gar Ponto  - Standing Shoulder Row with Anchored Resistance  - 1 x daily - 7 x weekly - 2-3 sets - 10 reps - 2 hold - Shoulder extension with resistance - Neutral  - 1 x daily - 7 x weekly - 2-3 sets - 10 reps - 2 hold -added 05/18/22 - Sidelying Shoulder Abduction Palm Forward  - 1 x daily - 7 x weekly - 2-3 sets - 10 reps - Sidelying Shoulder ER with Towel and Dumbbell  - 1 x daily - 7 x weekly - 2-3 sets - 10 reps   ASSESSMENT:   CLINICAL IMPRESSION: Pt was able to complete all prescribed exercises with  no adverse effect or increase in pain. Therapy focused on improving scapular stabilizer and RTC strength for decreasing shoulder pain. Pt is progressing well with therapy, will continue to progress as tolerated per POC.   OBJECTIVE IMPAIRMENTS increased fascial restrictions, impaired UE functional use, and pain.   ACTIVITY LIMITATIONS carrying, lifting, and reach over head   PARTICIPATION LIMITATIONS: cleaning and occupation   PERSONAL FACTORS Past/current experiences, Profession, and Time since onset of injury/illness/exacerbation are also affecting patient's functional outcome.      GOALS:   SHORT TERM GOALS = LTGs     LONG TERM GOALS: Target date: 08/11/22    Pt will be Ind in a final HEP to maintain achieved LOF and QOL Baseline: not started Goal status: MET   2.  Pt will voice understanding of measures to assist in pain reduction  Baseline: started Goal status: MET   3.  Pt's FOTO score will improved to the predicted value of 73% as indication of improved function Baseline: 62% Status: 92% RUE, no survey for LUE Goal status: MET   4.  Pt will report a decrease in R elbow pain to 4/10 or less with daily and work related activities for improved R UE function Baseline: 0-9/10 Status: 05/16/22:Pain level under 4/10  for elbow, average 0/10. Goal status: MET   5.  Pt will report no difficulty with lifting 10# overhead with either UE as indication of improved function with less pain Baseline: Pt reports some difficulty Status:06/20/22 = pain c L shoulder c more weakness L > R Goal status: Ongoing  6. Pt will report an improved pain range for the L shoulder with daily and work activities to 0-5/10.  Baseline: 2-9/10 Status: 06/20/22=4/10 near end range/painful arc Goal status: Improving  7. Pt will report good tolerance of both Upper Extremities with physical demands of his new job  Baseline: TBD  Goal Status: Initial   PLAN: PT FREQUENCY: 2x/week   PT DURATION: 4 weeks   PLANNED INTERVENTIONS: Therapeutic exercises, Therapeutic activity, Patient/Family education, Self Care, Joint mobilization, Dry Needling, Cryotherapy, Moist heat, Taping, Ultrasound, Ionotophoresis 52m/ml Dexamethasone, Manual therapy, and Re-evaluation   PLAN FOR NEXT SESSION:  L Shoulder strength/functional strengthening. R elbow and L shoulder strengthening.   DWard ChattersPT  08/03/22 9:02 AM

## 2022-08-09 ENCOUNTER — Ambulatory Visit: Payer: No Typology Code available for payment source | Attending: Sports Medicine

## 2022-08-09 DIAGNOSIS — M25521 Pain in right elbow: Secondary | ICD-10-CM | POA: Insufficient documentation

## 2022-08-09 DIAGNOSIS — G8929 Other chronic pain: Secondary | ICD-10-CM | POA: Insufficient documentation

## 2022-08-09 DIAGNOSIS — M25512 Pain in left shoulder: Secondary | ICD-10-CM | POA: Diagnosis present

## 2022-08-09 NOTE — Therapy (Unsigned)
OUTPATIENT PHYSICAL THERAPY TREATMENT NOTE   Patient Name: Jeffrey Griffin MRN: [id number] DOB:[birth year], 61 y.o., male Today's Date: 08/10/2022  PCP: [name], DO REFERRING PROVIDER: Thurman Coyer, DO; Velora Mediate, MD    END OF SESSION:   PT End of Session - 08/09/22 1817     Visit Number 15    Number of Visits 17    Date for PT Re-Evaluation 08/24/22    Authorization Type [place] PREFERRED    PT Start Time 1818    PT Stop Time 1900    PT Time Calculation (min) 42 min    Activity Tolerance Patient tolerated treatment well    Behavior During Therapy WFL for tasks assessed/performed                      Past Medical History:  Diagnosis Date   Abnormality of thoracic aorta    4.3 CM ECTATIC ASENDING    Cardiopathy    CHF (congestive heart failure) ([place])    SYSTOLIC   Epistaxis 11/17/1759   Hyperlipidemia    Hypertension    Lung nodule    Obesity    OSA (obstructive sleep apnea)    Persistent atrial fibrillation ([place])    WITH RVR   Pleural effusion    RIGHT UPPER LOBE AND RIGHT LOWER LOBE    Past Surgical History:  Procedure Laterality Date   APPENDECTOMY     ATRIAL FIBRILLATION ABLATION N/A 07/16/2020   Procedure: ATRIAL FIBRILLATION ABLATION;  Surgeon: [name], MD;  Location: [place] [facility];  Service: Cardiovascular;  Laterality: N/A;   CARDIOVERSION N/A 10/20/2019   Procedure: CARDIOVERSION;  Surgeon: Acie Fredrickson Wonda Cheng, MD;  Location: [place];  Service: Cardiovascular;  Laterality: N/A;   CARDIOVERSION N/A 05/31/2020   Procedure: CARDIOVERSION;  Surgeon: Josue Hector, MD;  Location: [place];  Service: Cardiovascular;  Laterality: N/A;   IR ANGIOGRAM PULMONARY BILATERAL SELECTIVE  03/09/2022   IR ANGIOGRAM SELECTIVE EACH ADDITIONAL VESSEL  03/09/2022   IR ANGIOGRAM SELECTIVE EACH ADDITIONAL VESSEL  03/09/2022   IR THROMBECT PRIM MECH INIT (INCLU) MOD SED  03/09/2022   IR US GUIDE VASC ACCESS RIGHT  03/09/2022   RADIOLOGY  WITH ANESTHESIA N/A 03/09/2022   Procedure: IR WITH ANESTHESIA;  Surgeon: Radiologist, Medication, MD;  Location: [place];  Service: Radiology;  Laterality: N/A;   VASECTOMY     Patient Active Problem List   Diagnosis Date Noted   Knee pain, bilateral 03/12/2022   Pulmonary embolism ([place]) 03/09/2022   OSA (obstructive sleep apnea) 03/09/2022   PE (pulmonary thromboembolism) ([place]) 03/09/2022   S/P prostatectomy    Paroxysmal atrial fibrillation ([facility])    Fatigue 03/04/2021   Subcutaneous nodule of left foot 03/04/2021   Atypical atrial flutter ([place]) 05/26/2020   Secondary hypercoagulable state ([place]) 04/13/2020   Chronic combined systolic and diastolic CHF (congestive heart failure) ([place]) 04/06/2020   Persistent atrial fibrillation ([place]) 10/01/2019   Pulmonary embolus ([place]) 10/01/2019    REFERRING DIAG: M25.521 (ICD-10-CM) - Right elbow pain; Biceps tendonitis right; L shoulder OA and RC tendinopathy  THERAPY DIAG:  Pain in right elbow - Plan: PT plan of care cert/re-cert  Chronic left shoulder pain - Plan: PT plan of care cert/re-cert  Rationale for Evaluation and Treatment Rehabilitation  PERTINENT HISTORY: CHF, Afib  PRECAUTIONS: None  SUBJECTIVE: Pt presents to PT with no current reports of pain or discomfort. Notes he had some pain over the weekend, but overall is feeling pretty  good. Pt is ready to begin PT at this time.   PAIN: L Shoulder Are you having pain?  0/10, end range/pain arc.otherwise 0/10 Yes: NPRS scale: Prior injection  2+ weeks ago 0/10 Pain location:L shoulder Pain description: ache and sharp Aggravating factors: Raising the the L arm, waking up with his L arm above his head, liftting weight [place] Relieving factors: rest and support, ice pack, voltaren gel  PAIN: R elbow Are you having pain? 3/10  c resisted flexion other wise 0/10 Yes: NPRS scale: 0-1/10 L shoulder abd Pain location: R elbow Pain description: ache and sharp Aggravating factors: Elbow  extension and flexion end range, certain r arm movements Relieving factors: rest and support, ice pack Pain range on eval: 0-3/10   OBJECTIVE: (objective measures completed at initial evaluation unless otherwise dated)  PATIENT SURVEYS:  FOTO Perceived function 62%, predicted 73% FOTO 92% -FOTO DISCHARGED FOR RIGHT UE   UPPER EXTREMITY ROM:                        WNLs Active ROM Right Eval 04/21/22 Left 05/11/22  Shoulder flexion   WNLs pain only at end range  Shoulder extension    WNLs   Shoulder abduction    WNLs pain only at end range  Shoulder adduction      Shoulder internal rotation   T9 Painful  Shoulder external rotation Pain at end range T4   Elbow flexion Pain at end range    Elbow extension      Wrist flexion      Wrist extension      Wrist ulnar deviation      Wrist radial deviation      Wrist pronation      Wrist supination Pain at end range    (Blank rows = not tested)   UPPER EXTREMITY MMT:   MMT Right Eval 04/21/22 Left Eval 05/11/22 Right 05/18/22 Left 05/18/22  Shoulder flexion 5  5    Shoulder extension 5  5    Shoulder abduction 5  5    Shoulder adduction 5  5    Shoulder internal rotation 5, pain  5    Shoulder external rotation 5  5    Middle trapezius        Lower trapezius        Elbow flexion 5, pain  5    Elbow extension 5  5    Wrist flexion 5, pain      Wrist extension 5      Wrist ulnar deviation 5      Wrist radial deviation 5      Wrist pronation 5      Wrist supination 5      Grip strength (lbs) TBA TBA 120 lb 108 lb  (Blank rows = not tested)   SHOULDER SPECIAL TESTS:  05/11/22 Hawkins-Kennedy: Pos; Empty can: Neg; Full can: Neg. Crank test: Pos for pain, but not clicking and popping             04/21/22: Biceps assessment: Yergason's test: negative and Speed's test: negative   JOINT MOBILITY TESTING:  Medial and lateral elbow ligament stress testing negative   PALPATION:  TTP to the distal aspect and insertion of the bicep  tendon  TODAY'S TREATMENT:  [facility] Adult PT Treatment:  DATE: 08/09/2022 Therapeutic Exercise: UBE L 2.5 x 2 min each while taking subjective Shoulder row Cables 33#  3x15 Shoulder L IR/ER 2x15 10# Standing W at wall 2x15 Prone I/Y/T 3x10 3# each hand Prone W with elevation x 10  S/L ER 2x15 5# each Shoulder IR/ER 2x30" body blade   [facility] Adult PT Treatment:                                                DATE: 08/02/2022 Therapeutic Exercise: UBE L 2.5 x 2 min each while taking subjective Shoulder row Cables 33#  3x15 Shoulder L IR/ER 2x15 10# Prone Y/T 2x10 3# each hand Prone W with elevation x 10  S/L ER 2x15 5# each Standing Y 5# each 3x10 Shoulder IR/ER 2x30" body blade   [facility] Adult PT Treatment:                                                DATE: 07/26/2022 Therapeutic Exercise: UBE L 2.5 x 2 min each while taking subjective Shoulder row Cables 33#  3x15 Shoulder L IR/ER 2x15 10# Standing Y/Fly 3x10 7# Prone Y/T 2x10 3# each hand S/L ER 2x15 5# each Manual Therapy: CFM to R distal bicep  [facility] Adult PT Treatment:                                                DATE: 07/19/22 Therapeutic Exercise: UBE L 2.5 x 2 min each while taking subjective Shoulder row Cables 33#  3x15 Shoulder L IR/ER 2x15 10# Shoulder ext Cables 33# 2x10 S/L ER 3x10 4# L  S/L shoulder abd 2x10 5# Body blade IR/ER 2x30" each Body blade flex/ext 90 each Serratus punch 2x10 5# each  [facility] Adult PT Treatment:                                                DATE: 07/12/22 Therapeutic Exercise: UBE L2.5 x 2 min each  Shoulder row Cables 33#  2x10 Shoulder L IR/ER 2x15 10# Shoulder ext Cables 33# 2x10 Body blade IR/ER 2x30" L Body blade flex/ext 90 L Wrist curl bar 2x30" flex/ext Serratus punch 2x10 5# each Pronation/supination 2x10 8# DB   PATIENT EDUCATION: Education details: HEP, CFM Person educated: Patient Education method: Explanation,  Demonstration, Tactile cues, and Verbal cues Education comprehension: verbalized understanding, returned demonstration, verbal cues required, and tactile cues required    HOME EXERCISE PROGRAM: Access Code: [email or web address] URL: [email or web address] Date: 08/09/2022 Prepared by: Octavio Manns  Exercises - Seated Elbow Flexion and Extension AROM  - 1 x daily - 7 x weekly - 3 sets - 10 reps - Forearm Pronation and Supination with Hammer  - 1 x daily - 7 x weekly - 3 sets - 10 reps - Standing Bicep Stretch at Wall  - 1 x daily - 7 x weekly - 2-3 reps - 30 sec hold - Standing Shoulder Row with Anchored Resistance  - 1 x  daily - 7 x weekly - 2-3 sets - 10 reps - 2 hold - Shoulder extension with resistance - Neutral  - 1 x daily - 7 x weekly - 2-3 sets - 10 reps - 2 hold - Shoulder Internal Rotation with Resistance  - 1 x daily - 7 x weekly - 2-3 sets - 10 reps - Shoulder External Rotation with Anchored Resistance  - 1 x daily - 7 x weekly - 2-3 sets - 10 reps - Sidelying Shoulder Abduction Palm Forward  - 1 x daily - 7 x weekly - 2-3 sets - 10 reps - Sidelying Shoulder ER with Towel and Dumbbell  - 1 x daily - 7 x weekly - 2-3 sets - 10 reps - Prone T  - 2 x weekly - 2 sets - 10 reps - Prone Scapular Retraction Y  - 2 x weekly - 2 sets - 10 reps - Prone I  - 2 x weekly - 2-3 sets - 10 reps - Prone W Scapular Retraction  - 2 x weekly - 2-3 sets - 10 reps   ASSESSMENT:   CLINICAL IMPRESSION: Pt was able to complete all prescribed exercises with no adverse effect or increase in pain. Therapy once again focused on improving periscapular strength and stability. He continues to benefit from skilled PT services, will assess all goals in two weeks at which point pt will most likely be good for discharge home with HEP.  OBJECTIVE IMPAIRMENTS increased fascial restrictions, impaired UE functional use, and pain.   ACTIVITY LIMITATIONS carrying, lifting, and reach over head   PARTICIPATION  LIMITATIONS: cleaning and occupation   PERSONAL FACTORS Past/current experiences, Profession, and Time since onset of injury/illness/exacerbation are also affecting patient's functional outcome.      GOALS:   SHORT TERM GOALS = LTGs     LONG TERM GOALS: Target date: 08/24/22    Pt will be Ind in a final HEP to maintain achieved LOF and QOL Baseline: not started Goal status: MET   2.  Pt will voice understanding of measures to assist in pain reduction  Baseline: started Goal status: MET   3.  Pt's FOTO score will improved to the predicted value of 73% as indication of improved function Baseline: 62% Status: 92% RUE, no survey for LUE Goal status: MET   4.  Pt will report a decrease in R elbow pain to 4/10 or less with daily and work related activities for improved R UE function Baseline: 0-9/10 Status: 05/16/22:Pain level under 4/10 for elbow, average 0/10. Goal status: MET   5.  Pt will report no difficulty with lifting 10# overhead with either UE as indication of improved function with less pain Baseline: Pt reports some difficulty Status:06/20/22 = pain c L shoulder c more weakness L > R Goal status: Ongoing  6. Pt will report an improved pain range for the L shoulder with daily and work activities to 0-5/10.  Baseline: 2-9/10 Status: 06/20/22=4/10 near end range/painful arc Goal status: Improving  7. Pt will report good tolerance of both Upper Extremities with physical demands of his new job  Baseline: TBD  Goal Status: Initial   PLAN: PT FREQUENCY: 2x/week   PT DURATION: 4 weeks   PLANNED INTERVENTIONS: Therapeutic exercises, Therapeutic activity, Patient/Family education, Self Care, Joint mobilization, Dry Needling, Cryotherapy, Moist heat, Taping, Ultrasound, Ionotophoresis 40m/ml Dexamethasone, Manual therapy, and Re-evaluation   PLAN FOR NEXT SESSION:  L Shoulder strength/functional strengthening. R elbow and L shoulder strengthening.   DMonia Sabal  Tenasia Aull PT   08/10/22 8:54 AM

## 2022-08-14 ENCOUNTER — Other Ambulatory Visit: Payer: Self-pay | Admitting: Cardiovascular Disease

## 2022-08-16 ENCOUNTER — Ambulatory Visit: Payer: No Typology Code available for payment source

## 2022-08-16 DIAGNOSIS — M25521 Pain in right elbow: Secondary | ICD-10-CM

## 2022-08-16 DIAGNOSIS — G8929 Other chronic pain: Secondary | ICD-10-CM

## 2022-08-16 NOTE — Therapy (Signed)
OUTPATIENT PHYSICAL THERAPY TREATMENT NOTE   Patient Name: Jeffrey Griffin MRN: [id number] DOB:04/12/1961, 61 y.o., male Today's Date: 08/16/2022  PCP: Lilland, Alana, DO REFERRING PROVIDER: Draper, Timothy R, DO; Howard, Thomas M, MD    END OF SESSION:   PT End of Session - 08/16/22 1830     Visit Number 16    Number of Visits 17    Date for PT Re-Evaluation 08/24/22    Authorization Type [employer] PREFERRED    PT Start Time 1830    PT Stop Time 1908    PT Time Calculation (min) 38 min    Activity Tolerance Patient tolerated treatment well    Behavior During Therapy WFL for tasks assessed/performed                      Past Medical History:  Diagnosis Date   Abnormality of thoracic aorta    4.3 CM ECTATIC ASENDING    Cardiopathy    CHF (congestive heart failure) (HCC)    SYSTOLIC   Epistaxis 03/13/2022   Hyperlipidemia    Hypertension    Lung nodule    Obesity    OSA (obstructive sleep apnea)    Persistent atrial fibrillation (HCC)    WITH RVR   Pleural effusion    RIGHT UPPER LOBE AND RIGHT LOWER LOBE    Past Surgical History:  Procedure Laterality Date   APPENDECTOMY     ATRIAL FIBRILLATION ABLATION N/A 07/16/2020   Procedure: ATRIAL FIBRILLATION ABLATION;  Surgeon: Allred, James, MD;  Location: [facility];  Service: Cardiovascular;  Laterality: N/A;   CARDIOVERSION N/A 10/20/2019   Procedure: CARDIOVERSION;  Surgeon: Nahser, Philip J, MD;  Location: [facility];  Service: Cardiovascular;  Laterality: N/A;   CARDIOVERSION N/A 05/31/2020   Procedure: CARDIOVERSION;  Surgeon: Nishan, Peter C, MD;  Location: [facility];  Service: Cardiovascular;  Laterality: N/A;   IR ANGIOGRAM PULMONARY BILATERAL SELECTIVE  03/09/2022   IR ANGIOGRAM SELECTIVE EACH ADDITIONAL VESSEL  03/09/2022   IR ANGIOGRAM SELECTIVE EACH ADDITIONAL VESSEL  03/09/2022   IR THROMBECT PRIM MECH INIT (INCLU) MOD SED  03/09/2022   IR US GUIDE VASC ACCESS RIGHT  03/09/2022   RADIOLOGY  WITH ANESTHESIA N/A 03/09/2022   Procedure: IR WITH ANESTHESIA;  Surgeon: Radiologist, Medication, MD;  Location: [facility] OR;  Service: Radiology;  Laterality: N/A;   VASECTOMY     Patient Active Problem List   Diagnosis Date Noted   Knee pain, bilateral 03/12/2022   Pulmonary embolism (HCC) 03/09/2022   OSA (obstructive sleep apnea) 03/09/2022   PE (pulmonary thromboembolism) (HCC) 03/09/2022   S/P prostatectomy    Paroxysmal atrial fibrillation (HCC)    Fatigue 03/04/2021   Subcutaneous nodule of left foot 03/04/2021   Atypical atrial flutter (HCC) 05/26/2020   Secondary hypercoagulable state (HCC) 04/13/2020   Chronic combined systolic and diastolic CHF (congestive heart failure) (HCC) 04/06/2020   Persistent atrial fibrillation (HCC) 10/01/2019   Pulmonary embolus (HCC) 10/01/2019    REFERRING DIAG: M25.521 (ICD-10-CM) - Right elbow pain; Biceps tendonitis right; L shoulder OA and RC tendinopathy  THERAPY DIAG:  Pain in right elbow  Chronic left shoulder pain  Rationale for Evaluation and Treatment Rehabilitation  PERTINENT HISTORY: CHF, Afib  PRECAUTIONS: None  SUBJECTIVE: Pt presents to PT with reports of continued no shoulder pain. Has been compliant with HEP with no adverse effect. Pt is ready ot begin PT at this time.   PAIN: L Shoulder Are you having pain?    0/10, end range/pain arc.otherwise 0/10 Yes: NPRS scale: Prior injection  2+ weeks ago 0/10 Pain location:L shoulder Pain description: ache and sharp Aggravating factors: Raising the the L arm, waking up with his L arm above his head, liftting weight OH Relieving factors: rest and support, ice pack, voltaren gel  PAIN: R elbow Are you having pain? 3/10  c resisted flexion other wise 0/10 Yes: NPRS scale: 0-1/10 L shoulder abd Pain location: R elbow Pain description: ache and sharp Aggravating factors: Elbow extension and flexion end range, certain r arm movements Relieving factors: rest and support, ice  pack Pain range on eval: 0-3/10   OBJECTIVE: (objective measures completed at initial evaluation unless otherwise dated)  PATIENT SURVEYS:  FOTO Perceived function 62%, predicted 73% FOTO 92% -FOTO DISCHARGED FOR RIGHT UE   UPPER EXTREMITY ROM:                        WNLs Active ROM Right Eval 04/21/22 Left 05/11/22  Shoulder flexion   WNLs pain only at end range  Shoulder extension    WNLs   Shoulder abduction    WNLs pain only at end range  Shoulder adduction      Shoulder internal rotation   T9 Painful  Shoulder external rotation Pain at end range T4   Elbow flexion Pain at end range    Elbow extension      Wrist flexion      Wrist extension      Wrist ulnar deviation      Wrist radial deviation      Wrist pronation      Wrist supination Pain at end range    (Blank rows = not tested)   UPPER EXTREMITY MMT:   MMT Right Eval 04/21/22 Left Eval 05/11/22 Right 05/18/22 Left 05/18/22  Shoulder flexion 5  5    Shoulder extension 5  5    Shoulder abduction 5  5    Shoulder adduction 5  5    Shoulder internal rotation 5, pain  5    Shoulder external rotation 5  5    Middle trapezius        Lower trapezius        Elbow flexion 5, pain  5    Elbow extension 5  5    Wrist flexion 5, pain      Wrist extension 5      Wrist ulnar deviation 5      Wrist radial deviation 5      Wrist pronation 5      Wrist supination 5      Grip strength (lbs) TBA TBA 120 lb 108 lb  (Blank rows = not tested)   SHOULDER SPECIAL TESTS:  05/11/22 Hawkins-Kennedy: Pos; Empty can: Neg; Full can: Neg. Crank test: Pos for pain, but not clicking and popping             04/21/22: Biceps assessment: Yergason's test: negative and Speed's test: negative   JOINT MOBILITY TESTING:  Medial and lateral elbow ligament stress testing negative   PALPATION:  TTP to the distal aspect and insertion of the bicep tendon  TODAY'S TREATMENT:  [facility] Adult PT Treatment:                                                   DATE: 08/16/2022 Therapeutic Exercise: UBE L 2.5 x 2 min each while taking subjective Shoulder row Cables 33#  3x15 Shoulder L IR/ER 2x15 10# Standing W at wall x 15 Prone I/Y/T 2x10 3# each hand Prone W with elevation 2x10  S/L shoulder flexion 5# x 10 S/L ER x 15 5# each  [facility] Adult PT Treatment:                                                DATE: 08/09/2022 Therapeutic Exercise: UBE L 2.5 x 2 min each while taking subjective Shoulder row Cables 33#  3x15 Shoulder L IR/ER 2x15 10# Standing W at wall 2x15 Prone I/Y/T 3x10 3# each hand Prone W with elevation x 10  S/L ER 2x15 5# each Shoulder IR/ER 2x30" body blade   [facility] Adult PT Treatment:                                                DATE: 08/02/2022 Therapeutic Exercise: UBE L 2.5 x 2 min each while taking subjective Shoulder row Cables 33#  3x15 Shoulder L IR/ER 2x15 10# Prone Y/T 2x10 3# each hand Prone W with elevation x 10  S/L ER 2x15 5# each Standing Y 5# each 3x10 Shoulder IR/ER 2x30" body blade   [facility] Adult PT Treatment:                                                DATE: 07/26/2022 Therapeutic Exercise: UBE L 2.5 x 2 min each while taking subjective Shoulder row Cables 33#  3x15 Shoulder L IR/ER 2x15 10# Standing Y/Fly 3x10 7# Prone Y/T 2x10 3# each hand S/L ER 2x15 5# each Manual Therapy: CFM to R distal bicep  [facility] Adult PT Treatment:                                                DATE: 07/19/22 Therapeutic Exercise: UBE L 2.5 x 2 min each while taking subjective Shoulder row Cables 33#  3x15 Shoulder L IR/ER 2x15 10# Shoulder ext Cables 33# 2x10 S/L ER 3x10 4# L  S/L shoulder abd 2x10 5# Body blade IR/ER 2x30" each Body blade flex/ext 90 each Serratus punch 2x10 5# each  [facility] Adult PT Treatment:                                                DATE: 07/12/22 Therapeutic Exercise: UBE L2.5 x 2 min each  Shoulder row Cables 33#  2x10 Shoulder L IR/ER 2x15 10# Shoulder ext Cables 33#  2x10 Body blade IR/ER 2x30" L Body blade flex/ext 90 L Wrist curl bar 2x30" flex/ext Serratus punch 2x10 5# each Pronation/supination 2x10 8# DB   PATIENT EDUCATION: Education details: HEP, CFM Person educated: Patient Education method: Explanation, Demonstration, Tactile cues, and Verbal cues Education comprehension:   verbalized understanding, returned demonstration, verbal cues required, and tactile cues required    HOME EXERCISE PROGRAM: Access Code: [email or web address] URL: [email or web address] Date: 08/09/2022 Prepared by: David Stroup  Exercises - Seated Elbow Flexion and Extension AROM  - 1 x daily - 7 x weekly - 3 sets - 10 reps - Forearm Pronation and Supination with Hammer  - 1 x daily - 7 x weekly - 3 sets - 10 reps - Standing Bicep Stretch at Wall  - 1 x daily - 7 x weekly - 2-3 reps - 30 sec hold - Standing Shoulder Row with Anchored Resistance  - 1 x daily - 7 x weekly - 2-3 sets - 10 reps - 2 hold - Shoulder extension with resistance - Neutral  - 1 x daily - 7 x weekly - 2-3 sets - 10 reps - 2 hold - Shoulder Internal Rotation with Resistance  - 1 x daily - 7 x weekly - 2-3 sets - 10 reps - Shoulder External Rotation with Anchored Resistance  - 1 x daily - 7 x weekly - 2-3 sets - 10 reps - Sidelying Shoulder Abduction Palm Forward  - 1 x daily - 7 x weekly - 2-3 sets - 10 reps - Sidelying Shoulder ER with Towel and Dumbbell  - 1 x daily - 7 x weekly - 2-3 sets - 10 reps - Prone T  - 2 x weekly - 2 sets - 10 reps - Prone Scapular Retraction Y  - 2 x weekly - 2 sets - 10 reps - Prone I  - 2 x weekly - 2-3 sets - 10 reps - Prone W Scapular Retraction  - 2 x weekly - 2-3 sets - 10 reps   ASSESSMENT:   CLINICAL IMPRESSION: Pt was able to complete all prescribed exercises with no adverse effect or increase in pain. Therapy once again focused on improving periscapular strength and stability. He continues to benefit from skilled PT services, will assess all goals in  two weeks at which point pt will most likely be good for discharge home with HEP.   OBJECTIVE IMPAIRMENTS increased fascial restrictions, impaired UE functional use, and pain.   ACTIVITY LIMITATIONS carrying, lifting, and reach over head   PARTICIPATION LIMITATIONS: cleaning and occupation   PERSONAL FACTORS Past/current experiences, Profession, and Time since onset of injury/illness/exacerbation are also affecting patient's functional outcome.      GOALS:   SHORT TERM GOALS = LTGs     LONG TERM GOALS: Target date: 08/24/22    Pt will be Ind in a final HEP to maintain achieved LOF and QOL Baseline: not started Goal status: MET   2.  Pt will voice understanding of measures to assist in pain reduction  Baseline: started Goal status: MET   3.  Pt's FOTO score will improved to the predicted value of 73% as indication of improved function Baseline: 62% Status: 92% RUE, no survey for LUE Goal status: MET   4.  Pt will report a decrease in R elbow pain to 4/10 or less with daily and work related activities for improved R UE function Baseline: 0-9/10 Status: 05/16/22:Pain level under 4/10 for elbow, average 0/10. Goal status: MET   5.  Pt will report no difficulty with lifting 10# overhead with either UE as indication of improved function with less pain Baseline: Pt reports some difficulty Status:06/20/22 = pain c L shoulder c more weakness L > R Goal status: Ongoing  6. Pt will report an improved pain range for   the L shoulder with daily and work activities to 0-5/10.  Baseline: 2-9/10 Status: 06/20/22=4/10 near end range/painful arc Goal status: Improving  7. Pt will report good tolerance of both Upper Extremities with physical demands of his new job  Baseline: TBD  Goal Status: Initial   PLAN: PT FREQUENCY: 2x/week   PT DURATION: 4 weeks   PLANNED INTERVENTIONS: Therapeutic exercises, Therapeutic activity, Patient/Family education, Self Care, Joint mobilization, Dry  Needling, Cryotherapy, Moist heat, Taping, Ultrasound, Ionotophoresis 4mg/ml Dexamethasone, Manual therapy, and Re-evaluation   PLAN FOR NEXT SESSION:  L Shoulder strength/functional strengthening. R elbow and L shoulder strengthening.   David C Stroup PT  08/16/22 7:11 PM      

## 2022-08-17 ENCOUNTER — Ambulatory Visit: Payer: No Typology Code available for payment source | Admitting: Family Medicine

## 2022-08-21 ENCOUNTER — Ambulatory Visit (INDEPENDENT_AMBULATORY_CARE_PROVIDER_SITE_OTHER): Payer: No Typology Code available for payment source | Admitting: Family Medicine

## 2022-08-21 ENCOUNTER — Encounter: Payer: Self-pay | Admitting: Family Medicine

## 2022-08-21 VITALS — BP 138/84 | HR 64 | Ht 69.0 in | Wt 248.6 lb

## 2022-08-21 DIAGNOSIS — I2694 Multiple subsegmental pulmonary emboli without acute cor pulmonale: Secondary | ICD-10-CM

## 2022-08-21 DIAGNOSIS — Z9079 Acquired absence of other genital organ(s): Secondary | ICD-10-CM

## 2022-08-21 DIAGNOSIS — J339 Nasal polyp, unspecified: Secondary | ICD-10-CM

## 2022-08-21 NOTE — Patient Instructions (Signed)
I put in the referrals for the ENT and hematologist. Check back in after the new year if you haven't heard anything to see if you can reach out to the specialist sooner to schedule your appointment.

## 2022-08-21 NOTE — Assessment & Plan Note (Signed)
Patient stable on Eliquis BID. Patient wondering if he has thrombophilic condition given his lack of bleeding with his injuries even while being on a DOAC. Given that the patient had a significant PE burden when off the Eliquis while awaiting prostatectomy, feel that hematology evaluation is not out of the question. Unsure if there would be a benefit in changing therapy and would need more assistance with interpretations of labs if we were to get them, so will defer to specialist.  - Hematology referral placed - Continue Eliquis BID

## 2022-08-21 NOTE — Progress Notes (Signed)
    SUBJECTIVE:   CHIEF COMPLAINT / HPI:   Blood thinner - Reports he had a discussion with his EP doctor and they considered if he should be checked for thrombophilic condition - Patient had multiple PE in the setting of being off Eliquis while awaiting prostatectomy for cancer - Is on Eliquis BID currently but is not having any change in his bleeding (feels he doesn't bleed much) and is concerned that it may mean he need a different treatment - Would like to talk with a hematologist  Prostatic cancer - Has increasing PSA and then are looking towards radiation vs other treatment   Nasal Sore? - Flares at times and causes patient discomfort - Is not taking anything for the sensation - Would be agreeable to ENT  PERTINENT  PMH / PSH: Reviewed  OBJECTIVE:   BP 138/84   Pulse 64   Ht '5\' 9"'$  (1.753 m)   Wt 248 lb 9.6 oz (112.8 kg)   SpO2 98%   BMI 36.71 kg/m   General: NAD, well-appearing, well-nourished Respiratory: No respiratory distress, breathing comfortably, able to speak in full sentences Skin: warm and dry, no rashes noted on exposed skin Psych: Appropriate affect and mood HEENT: right nare with lateral erythema noted without discharge or obvious lesion otherwise, unable to visualize much on evaluation due to nasal hair and equipment.   ASSESSMENT/PLAN:   Pulmonary embolus (HCC) Patient stable on Eliquis BID. Patient wondering if he has thrombophilic condition given his lack of bleeding with his injuries even while being on a DOAC. Given that the patient had a significant PE burden when off the Eliquis while awaiting prostatectomy, feel that hematology evaluation is not out of the question. Unsure if there would be a benefit in changing therapy and would need more assistance with interpretations of labs if we were to get them, so will defer to specialist.  - Hematology referral placed - Continue Eliquis BID  S/P prostatectomy PSA increased to 0.5, patient seeing  [facility] now. Given that this number is increased from his prior check several months ago despite the prostatectomy, they are evaluating to see if he will need radiation vs further imaging.   Nasal sore Unable to visualize on examination other than some erythema in the right nare. Consideration that it could be a bony growth or a polyp being irritated. As patient describes a sore, will wait to recommend nasal steroid until evaluation with ENT - Referral to ENT placed  [name], [place]

## 2022-08-21 NOTE — Assessment & Plan Note (Signed)
PSA increased to 0.5, patient seeing [facility] now. Given that this number is increased from his prior check several months ago despite the prostatectomy, they are evaluating to see if he will need radiation vs further imaging.

## 2022-08-23 ENCOUNTER — Ambulatory Visit: Payer: No Typology Code available for payment source

## 2022-08-23 DIAGNOSIS — M25521 Pain in right elbow: Secondary | ICD-10-CM

## 2022-08-23 DIAGNOSIS — G8929 Other chronic pain: Secondary | ICD-10-CM

## 2022-08-23 NOTE — Therapy (Signed)
OUTPATIENT PHYSICAL THERAPY TREATMENT NOTE  PHYSICAL THERAPY DISCHARGE SUMMARY  Visits from Start of Care: 17  Current functional level related to goals / functional outcomes: See goals and objective   Remaining deficits: See goals and objective   Education / Equipment: HEP   Patient agrees to discharge. Patient goals were met. Patient is being discharged due to meeting the stated rehab goals.   Patient Name: Jeffrey Griffin MRN: [id number] DOB:12-16-1960, 61 y.o., male Today's Date: 08/23/2022  PCP: [name], DO REFERRING PROVIDER: Thurman Coyer, DO; Velora Mediate, MD    END OF SESSION:   PT End of Session - 08/23/22 1757     Visit Number 17    Number of Visits 17    Date for PT Re-Evaluation 08/24/22    Authorization Type [place] PREFERRED    PT Start Time 2119    PT Stop Time 1850    PT Time Calculation (min) 40 min    Activity Tolerance Patient tolerated treatment well    Behavior During Therapy WFL for tasks assessed/performed                       Past Medical History:  Diagnosis Date   Abnormality of thoracic aorta    4.3 CM ECTATIC ASENDING    Cardiopathy    CHF (congestive heart failure) ([place])    SYSTOLIC   Epistaxis 12/20/4079   Hyperlipidemia    Hypertension    Lung nodule    Obesity    OSA (obstructive sleep apnea)    Persistent atrial fibrillation ([place])    WITH RVR   Pleural effusion    RIGHT UPPER LOBE AND RIGHT LOWER LOBE    Past Surgical History:  Procedure Laterality Date   APPENDECTOMY     ATRIAL FIBRILLATION ABLATION N/A 07/16/2020   Procedure: ATRIAL FIBRILLATION ABLATION;  Surgeon: [name], MD;  Location: [place] [facility];  Service: Cardiovascular;  Laterality: N/A;   CARDIOVERSION N/A 10/20/2019   Procedure: CARDIOVERSION;  Surgeon: Acie Fredrickson Wonda Cheng, MD;  Location: [place];  Service: Cardiovascular;  Laterality: N/A;   CARDIOVERSION N/A 05/31/2020   Procedure: CARDIOVERSION;  Surgeon: Josue Hector, MD;  Location: [facility];  Service: Cardiovascular;  Laterality: N/A;   IR ANGIOGRAM PULMONARY BILATERAL SELECTIVE  03/09/2022   IR ANGIOGRAM SELECTIVE EACH ADDITIONAL VESSEL  03/09/2022   IR ANGIOGRAM SELECTIVE EACH ADDITIONAL VESSEL  03/09/2022   IR THROMBECT PRIM MECH INIT (INCLU) MOD SED  03/09/2022   IR US GUIDE VASC ACCESS RIGHT  03/09/2022   RADIOLOGY WITH ANESTHESIA N/A 03/09/2022   Procedure: IR WITH ANESTHESIA;  Surgeon: Radiologist, Medication, MD;  Location: [place];  Service: Radiology;  Laterality: N/A;   VASECTOMY     Patient Active Problem List   Diagnosis Date Noted   Knee pain, bilateral 03/12/2022   Pulmonary embolism ([place]) 03/09/2022   OSA (obstructive sleep apnea) 03/09/2022   PE (pulmonary thromboembolism) ([place]) 03/09/2022   S/P prostatectomy    Paroxysmal atrial fibrillation ([facility])    Fatigue 03/04/2021   Subcutaneous nodule of left foot 03/04/2021   Atypical atrial flutter ([place]) 05/26/2020   Secondary hypercoagulable state ([place]) 04/13/2020   Chronic combined systolic and diastolic CHF (congestive heart failure) ([place]) 04/06/2020   Persistent atrial fibrillation ([place]) 10/01/2019   Pulmonary embolus ([place]) 10/01/2019    REFERRING DIAG: M25.521 (ICD-10-CM) - Right elbow pain; Biceps tendonitis right; L shoulder OA and RC tendinopathy  THERAPY DIAG:  Pain in right  elbow  Chronic left shoulder pain  Rationale for Evaluation and Treatment Rehabilitation  PERTINENT HISTORY: CHF, Afib  PRECAUTIONS: None  SUBJECTIVE: Pt presents to PT with reports of no current shoulder pain. Pt has been compliant with HEP. Ready to begin PT at this time.   PAIN: L Shoulder Are you having pain?  0/10, end range/pain arc.otherwise 0/10 No: NPRS scale: 0/10 Pain location: L shoulder Pain description: ache and sharp Aggravating factors: Raising the the L arm, waking up with his L arm above his head, liftting weight OH Relieving factors: rest and support, ice pack, voltaren  gel  PAIN: R elbow Are you having pain? 0/10  Yes: NPRS scale: 0-1/10 L shoulder abd Pain location: R elbow Pain description: ache and sharp Aggravating factors: Elbow extension and flexion end range, certain r arm movements Relieving factors: rest and support, ice pack Pain range on eval: 0-3/10   OBJECTIVE: (objective measures completed at initial evaluation unless otherwise dated)  PATIENT SURVEYS:  FOTO Perceived function 62%, predicted 73% FOTO 92% -FOTO DISCHARGED FOR RIGHT UE   UPPER EXTREMITY ROM:                        WNLs Active ROM Right Eval 04/21/22 Left 05/11/22 Left 12/20  Shoulder flexion   WNLs pain only at end range WNL 1/10 p! At end range  Shoulder extension    WNLs    Shoulder abduction    WNLs pain only at end range WNL 1/10 p! At end range  Shoulder adduction       Shoulder internal rotation   T9 Painful   Shoulder external rotation Pain at end range T4    Elbow flexion Pain at end range     Elbow extension       Wrist flexion       Wrist extension       Wrist ulnar deviation       Wrist radial deviation       Wrist pronation       Wrist supination Pain at end range     (Blank rows = not tested)   UPPER EXTREMITY MMT:   MMT Right Eval 04/21/22 Left Eval 05/11/22 Right 05/18/22 Left 05/18/22  Shoulder flexion 5  5    Shoulder extension 5  5    Shoulder abduction 5  5    Shoulder adduction 5  5    Shoulder internal rotation 5, pain  5    Shoulder external rotation 5  5    Middle trapezius        Lower trapezius        Elbow flexion 5, pain  5    Elbow extension 5  5    Wrist flexion 5, pain      Wrist extension 5      Wrist ulnar deviation 5      Wrist radial deviation 5      Wrist pronation 5      Wrist supination 5      Grip strength (lbs) TBA TBA 120 lb 108 lb  (Blank rows = not tested)   SHOULDER SPECIAL TESTS:  05/11/22 Hawkins-Kennedy: Pos; Empty can: Neg; Full can: Neg. Crank test: Pos for pain, but not clicking and popping              04/21/22: Biceps assessment: Yergason's test: negative and Speed's test: negative   JOINT MOBILITY TESTING:  Medial and lateral elbow ligament stress  testing negative   PALPATION:  TTP to the distal aspect and insertion of the bicep tendon  TODAY'S TREATMENT:  [facility] Adult PT Treatment:                                                DATE: 08/23/2022 Therapeutic Exercise: UBE L 2.5 x 2 min each while taking subjective Shoulder row Cables 37#  3x15 Shoulder ext 2x10 37# Shoulder L IR/ER 2x15 10# S/L ER x 15 5# S/L abd x 15 5# S/L flex x 15 5# Standing W at wall x 15 Prone I/Y/T 2x10 3# each hand  PATIENT EDUCATION: Education details: HEP and discharge plan Person educated: Patient Education method: Explanation, Demonstration, Tactile cues, and Verbal cues Education comprehension: verbalized understanding, returned demonstration, verbal cues required, and tactile cues required    HOME EXERCISE PROGRAM: Access Code: [email or web address] URL: [email or web address] Date: 08/23/2022 Prepared by: Octavio Manns  Exercises - Forearm Pronation and Supination with Hammer  - 2-3 x weekly - 3 sets - 10 reps - Standing Bicep Stretch at [place]  - 2-3 x weekly - 2-3 reps - 30 sec hold - Standing Shoulder Row with Anchored Resistance  - 2-3 x weekly - 2-3 sets - 10 reps - 2 hold - Shoulder extension with resistance - Neutral  - 2-3 x weekly - 2-3 sets - 10 reps - 2 hold - Shoulder Internal Rotation with Resistance  - 2-3 x weekly - 2-3 sets - 10 reps - Shoulder External Rotation with Anchored Resistance  - 2-3 x weekly - 2-3 sets - 10 reps - Sidelying Shoulder Abduction Palm Forward  - 2-3 x weekly - 2-3 sets - 10 reps - Sidelying Shoulder ER with Towel and Dumbbell  - 2-3 x weekly - 2-3 sets - 10 reps - Sidelying Shoulder Flexion 15 Degrees  - 2-3 x weekly - 3 sets - 10 reps - 5# hold - Prone T  - 2-3 x weekly - 2 sets - 10 reps - Prone Scapular Retraction Y  - 2-3 x weekly - 2 sets - 10  reps - Prone I  - 2-3 x weekly - 2-3 sets - 10 reps - Prone W Scapular Retraction  - 2-3 x weekly - 2-3 sets - 10 reps - Standing Shoulder W at Wall  - 2-3 x weekly - 2-3 sets - 10 reps   ASSESSMENT:   CLINICAL IMPRESSION: Pt was able to complete prescribed exercises and demonstrated knowledge of HEP with no adverse effect. Over the course of PT treatment he has progressed very well, noting significant decrease in pain in R elbow and L shoulder. He met his LTG for FOTO showing subjective improvement in functional ability. He has significant improvement in pain at L shoulder end range. Pt should continue to improve with HEP compliance. He is in agreement with current plan and ready to discharge from skilled PT at this time.   OBJECTIVE IMPAIRMENTS increased fascial restrictions, impaired UE functional use, and pain.   ACTIVITY LIMITATIONS carrying, lifting, and reach over head   PARTICIPATION LIMITATIONS: cleaning and occupation   PERSONAL FACTORS Past/current experiences, Profession, and Time since onset of injury/illness/exacerbation are also affecting patient's functional outcome.      GOALS:   SHORT TERM GOALS = LTGs     LONG TERM GOALS: Target date: 08/24/22    Pt will be Ind  in a final HEP to maintain achieved LOF and QOL Baseline: not started Goal status: MET   2.  Pt will voice understanding of measures to assist in pain reduction  Baseline: started Goal status: MET   3.  Pt's FOTO score will improved to the predicted value of 73% as indication of improved function Baseline: 62% Status: 92% RUE, no survey for LUE Goal status: MET   4.  Pt will report a decrease in R elbow pain to 4/10 or less with daily and work related activities for improved R UE function Baseline: 0-9/10 Status: 05/16/22:Pain level under 4/10 for elbow, average 0/10. Goal status: MET   5.  Pt will report no difficulty with lifting 10# overhead with either UE as indication of improved function with  less pain Baseline: Pt reports some difficulty Status:06/20/22 = pain c L shoulder c more weakness L > R Goal status: MET  6. Pt will report an improved pain range for the L shoulder with daily and work activities to 0-5/10.  Baseline: 2-9/10 Status: 06/20/22=4/10 near end range/painful arc Goal status: MOSTLY MET  7. Pt will report good tolerance of both Upper Extremities with physical demands of his new job  Baseline: TBD  Goal Status: MET   PLAN: PT FREQUENCY: 2x/week   PT DURATION: 4 weeks   PLANNED INTERVENTIONS: Therapeutic exercises, Therapeutic activity, Patient/Family education, Self Care, Joint mobilization, Dry Needling, Cryotherapy, Moist heat, Taping, Ultrasound, Ionotophoresis 40m/ml Dexamethasone, Manual therapy, and Re-evaluation   PLAN FOR NEXT SESSION:  L Shoulder strength/functional strengthening. R elbow and L shoulder strengthening.   DWard ChattersPT  08/23/22 6:57 PM

## 2022-09-11 NOTE — Progress Notes (Deleted)
[place] [phone]   Fax:([phone] [place] NOTE  Patient Care Team: [name], DO as PCP - General (Family Medicine) Nahser, Wonda Cheng, MD as PCP - Cardiology (Cardiology) Constance Haw, MD as PCP - Electrophysiology (Cardiology)  Hematological/Oncological History 03/08/2022-03/14/2022: Admitted after presenting with shortness of breath and back pain after bring off for 2 weeks due to prostatectomy. CTA chest reveled extensive bilateral pulmonary emboli with saddle pulmonary embolus with presence of right heart strain. Bilateral lower extremity ultrasound significant for DVT involving posterior tibial veins bilaterally. Echo significant for EF 55-60%, moderate left ventricular hypertrophy, aortic dilatation with dilatation of aortic root 42 mm. Dilatation of ascending aorta measuring 52m. CCM and interventional radiology consulted, patient had thrombectomy on 7/6. Patient was treated with heparin and then transitioned to home Eliquis.  CHIEF COMPLAINTS/PURPOSE OF CONSULTATION:  PE/DVT while on Eliquis therapy   HISTORY OF PRESENTING ILLNESS:  SRenardo Cheatum89y.o. male with medical history significant for ***  On review of the previous records ***  On exam today ***  MEDICAL HISTORY:  Past Medical History:  Diagnosis Date   Abnormality of thoracic aorta    4.3 CM ECTATIC ASENDING    Cardiopathy    CHF (congestive heart failure) (H[facility])    SYSTOLIC   Epistaxis 74/33/2951  Hyperlipidemia    Hypertension    Lung nodule    Obesity    OSA (obstructive sleep apnea)    Persistent atrial fibrillation (H[facility])    WITH RVR   Pleural effusion    RIGHT UPPER LOBE AND RIGHT LOWER LOBE     SURGICAL HISTORY: Past Surgical History:  Procedure Laterality Date   APPENDECTOMY     ATRIAL FIBRILLATION ABLATION N/A 07/16/2020   Procedure: ATRIAL FIBRILLATION ABLATION;  Surgeon: AThompson Grayer MD;  [facility];   Service: Cardiovascular;  Laterality: N/A;   CARDIOVERSION N/A 10/20/2019   Procedure: CARDIOVERSION;  Surgeon: NAcie FredricksonPWonda Cheng MD;  [place]  Service: Cardiovascular;  Laterality: N/A;   CARDIOVERSION N/A 05/31/2020   Procedure: CARDIOVERSION;  Surgeon: NJosue Hector MD;  [place]  Service: Cardiovascular;  Laterality: N/A;   IR ANGIOGRAM PULMONARY BILATERAL SELECTIVE  03/09/2022   IR ANGIOGRAM SELECTIVE EACH ADDITIONAL VESSEL  03/09/2022   IR ANGIOGRAM SELECTIVE EACH ADDITIONAL VESSEL  03/09/2022   IR THROMBECT PRIM MECH INIT (INCLU) MOD SED  03/09/2022   IR [place] VASC ACCESS RIGHT  03/09/2022   RADIOLOGY WITH ANESTHESIA N/A 03/09/2022   Procedure: IR WITH ANESTHESIA;  Surgeon: Radiologist, Medication, MD;  [place]  Service: Radiology;  Laterality: N/A;   VASECTOMY      SOCIAL HISTORY: Social History   Socioeconomic History   Marital status: Married    Spouse name: Not on file   Number of children: Not on file   Years of education: Not on file   Highest education level: Not on file  Occupational History   Not on file  Tobacco Use   Smoking status: Never   Smokeless tobacco: Never  Substance and Sexual Activity   Alcohol use: Not Currently   Drug use: No   Sexual activity: Not on file  Other Topics Concern   Not on file  Social History Narrative   [place]   Social Determinants of Health   Financial Resource Strain: Not on file  Food Insecurity: Not on file  Transportation Needs: Not on file  Physical Activity:  Not on file  Stress: Not on file  Social Connections: Not on file  Intimate Partner Violence: Not on file    FAMILY HISTORY: Family History  Problem Relation Age of Onset   Hypertrophic cardiomyopathy Mother    Stroke Father    Hypertension Father    Hypertension Sister    Atrial fibrillation Brother     ALLERGIES:  has No Known Allergies.  MEDICATIONS:  Current Outpatient Medications   Medication Sig Dispense Refill   acetaminophen (TYLENOL) 500 MG tablet Take 500 mg by mouth in the morning, at noon, and at bedtime.     atorvastatin (LIPITOR) 40 MG tablet TAKE 1 TABLET BY MOUTH EVERY DAY 30 tablet 7   Carnitine-B5-B6 500-15-5 MG TABS Take 1 tablet by mouth daily.     Cholecalciferol (VITAMIN D3) 25 MCG (1000 UT) CAPS Take 1,000 Units by mouth daily.     Coenzyme Q10 300 MG CAPS Take 1 capsule by mouth daily.     Cyanocobalamin (B-12) 5000 MCG CAPS Take 1 capsule by mouth daily.     diclofenac Sodium (VOLTAREN) 1 % GEL Apply 4 g topically 4 (four) times daily. 350 g 0   ELIQUIS 5 MG TABS tablet TAKE 1 TABLET BY MOUTH TWICE A DAY 740 tablet 2   Garlic 8144 MG CAPS Take 1 capsule by mouth daily.     Glucosamine-Chondroitin (COSAMIN DS PO) Take 2 tablets by mouth daily.     Hawthorn 150 MG CAPS Take 1 capsule by mouth daily at 6 (six) AM.     meclizine (ANTIVERT) 25 MG tablet TAKE 1 TABLET BY MOUTH 3 TIMES DAILY AS NEEDED FOR DIZZINESS. 90 tablet 1   metoprolol succinate (TOPROL-XL) 50 MG 24 hr tablet TAKE 1 TABLET BY MOUTH DAILY. TAKE WITH OR IMMEDIATELY FOLLOWING A MEAL. 90 tablet 3   Multiple Minerals-Vitamins (CAL MAG ZINC +D3 PO) Take 1 tablet by mouth daily.     Multiple Vitamin (MULTIVITAMIN WITH MINERALS) TABS tablet Take 1 tablet by mouth daily.     Omega-3 1400 MG CAPS Take 1 capsule by mouth daily.     oxybutynin (DITROPAN) 5 MG tablet Take 5 mg by mouth 3 (three) times daily.     polyethylene glycol powder (GLYCOLAX/MIRALAX) 17 GM/SCOOP powder Take 17 g by mouth daily. 238 g 0   sacubitril-valsartan (ENTRESTO) 97-103 MG Take 1 tablet by mouth 2 (two) times daily. 180 tablet 3   saline (AYR) GEL Place 1 Application into the nose every 4 (four) hours as needed (For nasal dryness).  0   Taurine 1000 MG CAPS Take 1 capsule by mouth daily.     vitamin E 180 MG (400 UNITS) capsule Take 400 Units by mouth daily.     No current facility-administered medications for this  visit.    REVIEW OF SYSTEMS:   Constitutional: ( - ) fevers, ( - )  chills , ( - ) night sweats Eyes: ( - ) blurriness of vision, ( - ) double vision, ( - ) watery eyes Ears, nose, mouth, throat, and face: ( - ) mucositis, ( - ) sore throat Respiratory: ( - ) cough, ( - ) dyspnea, ( - ) wheezes Cardiovascular: ( - ) palpitation, ( - ) chest discomfort, ( - ) lower extremity swelling Gastrointestinal:  ( - ) nausea, ( - ) heartburn, ( - ) change in bowel habits Skin: ( - ) abnormal skin rashes Lymphatics: ( - ) new lymphadenopathy, ( - ) easy bruising Neurological: ( - )  numbness, ( - ) tingling, ( - ) new weaknesses Behavioral/Psych: ( - ) mood change, ( - ) new changes  All other systems were reviewed with the patient and are negative.  PHYSICAL EXAMINATION: ECOG PERFORMANCE STATUS: {CHL ONC ECOG PS:[id number]}  There were no vitals filed for this visit. There were no vitals filed for this visit.  GENERAL: well appearing *** in NAD  SKIN: skin color, texture, turgor are normal, no rashes or significant lesions EYES: conjunctiva are pink and non-injected, sclera clear OROPHARYNX: no exudate, no erythema; lips, buccal mucosa, and tongue normal  NECK: supple, non-tender LYMPH:  no palpable lymphadenopathy in the cervical, axillary or supraclavicular lymph nodes.  LUNGS: clear to auscultation and percussion with normal breathing effort HEART: regular rate & rhythm and no murmurs and no lower extremity edema ABDOMEN: soft, non-tender, non-distended, normal bowel sounds Musculoskeletal: no cyanosis of digits and no clubbing  PSYCH: alert & oriented x 3, fluent speech NEURO: no focal motor/sensory deficits  LABORATORY DATA:  I have reviewed the data as listed    Latest Ref Rng & Units 03/14/2022    6:51 AM 03/13/2022   12:13 AM 03/12/2022   12:47 AM  CBC  WBC 4.0 - 10.5 K/uL 9.5  12.0  12.2   Hemoglobin 13.0 - 17.0 g/dL 11.0  10.6  10.3   Hematocrit 39.0 - 52.0 % 32.9  32.9  31.5    Platelets 150 - 400 K/uL 474  366  276        Latest Ref Rng & Units 03/12/2022   12:47 AM 03/08/2022    7:32 PM 12/26/2021    7:23 AM  CMP  Glucose 70 - 99 mg/dL 127  115  122   BUN 6 - 20 mg/dL '16  20  14   '$ Creatinine 0.61 - 1.24 mg/dL 0.79  0.84  1.10   Sodium 135 - 145 mmol/L 134  137  138   Potassium 3.5 - 5.1 mmol/L 4.0  4.2  4.2   Chloride 98 - 111 mmol/L 95  103  102   CO2 22 - 32 mmol/L '29  22  24   '$ Calcium 8.9 - 10.3 mg/dL 9.3  9.8  10.5   Total Protein 6.5 - 8.1 g/dL  6.5    Total Bilirubin 0.3 - 1.2 mg/dL  0.9    Alkaline Phos 38 - 126 U/L  76    AST 15 - 41 U/L  26    ALT 0 - 44 U/L  30       PATHOLOGY: ***  BLOOD FILM: *** Review of the peripheral blood smear showed normal appearing white cells with neutrophils that were appropriately lobated and granulated. There was no predominance of bi-lobed or hyper-segmented neutrophils appreciated. No Dohle bodies were noted. There was no left shifting, immature forms or blasts noted. Lymphocytes remain normal in size without any predominance of large granular lymphocytes. Red cells show no anisopoikilocytosis, macrocytes , microcytes or polychromasia. There were no schistocytes, target cells, echinocytes, acanthocytes, dacrocytes, or stomatocytes.There was no rouleaux formation, nucleated red cells, or intra-cellular inclusions noted. The platelets are normal in size, shape, and color without any clumping evident.  RADIOGRAPHIC STUDIES: I have personally reviewed the radiological images as listed and agreed with the findings in the report. No results found.  ASSESSMENT & PLAN ***  No orders of the defined types were placed in this encounter.   All questions were answered. The patient knows to call the clinic with any  problems, questions or concerns.  I have spent a total of {CHL ONC TIME VISIT - [id number]} minutes of face-to-face and non-face-to-face time, preparing to see the patient, obtaining and/or reviewing  separately obtained history, performing a medically appropriate examination, counseling and educating the patient, ordering medications/tests/procedures, referring and communicating with other health care professionals, documenting clinical information in the electronic health record, independently interpreting results and communicating results to the patient, and care coordination.   Dede Query, PA-C Department of Hematology/Oncology [place] [facility] Phone: [phone]

## 2022-09-12 ENCOUNTER — Other Ambulatory Visit: Payer: Self-pay | Admitting: Cardiovascular Disease

## 2022-09-12 ENCOUNTER — Inpatient Hospital Stay: Payer: No Typology Code available for payment source

## 2022-09-12 ENCOUNTER — Inpatient Hospital Stay: Payer: No Typology Code available for payment source | Attending: Physician Assistant | Admitting: Physician Assistant

## 2022-09-17 ENCOUNTER — Other Ambulatory Visit: Payer: Self-pay | Admitting: Cardiovascular Disease

## 2022-10-04 ENCOUNTER — Ambulatory Visit: Payer: No Typology Code available for payment source | Admitting: Family Medicine

## 2022-10-09 NOTE — Progress Notes (Signed)
[place] [phone]   Fax:([phone] [place] NOTE  Patient Care Team: [name], DO as PCP - General (Family Medicine) Nahser, Wonda Cheng, MD as PCP - Cardiology (Cardiology) Constance Haw, MD as PCP - Electrophysiology (Cardiology)  Hematological/Oncological History 09/15/2019: CTA showed acute subsegmental pulmonary embolism to the right upper and right lower lobes. Started anticoagulation therapy with Eliquis. 03/08/2022-03/14/2022: Admitted after presenting with shortness of breath and back pain after being off Eliquis for 2 weeks due to prostatectomy. CTA chest reveled extensive bilateral pulmonary emboli with saddle pulmonary embolus with presence of right heart strain. Bilateral lower extremity ultrasound significant for DVT involving posterior tibial veins bilaterally. Echo significant for EF 55-60%, moderate left ventricular hypertrophy, aortic dilatation with dilatation of aortic root 42 mm. Dilatation of ascending aorta measuring 25m. CCM and interventional radiology consulted, patient had thrombectomy on 7/6. Patient was treated with heparin and then transitioned to home Eliquis. [facility]2022[facility]  CHIEF COMPLAINTS/PURPOSE OF CONSULTATION:  Recurrent PE and DVT   HISTORY OF PRESENTING ILLNESS:  Jeffrey Soni62y.o. male with medical history significant for sleep apnea, hypertension, hyperlipidemia, atrial fibrillation presents to the hematology clinic for evaluation for recurrent pulmonary emboli/DVT.  He is unaccompanied for this visit.  On exam today, Mr. SLockabyreports he is tolerating Eliquis therapy well without any signs of bleeding or bruising.  He reports his symptoms of most recent pulmonary emboli noted to have resolved.  He denies any residual shortness of breath, chest pain or lower extremity edema.  He is otherwise stable without any changes to his energy or appetite.  He denies any  GI symptoms including nausea, vomiting, diarrhea or constipation.  He has no other complaints.  Rest of the 10 point ROS is below.  MEDICAL HISTORY:  Past Medical History:  Diagnosis Date   Abnormality of thoracic aorta    4.3 CM ECTATIC ASENDING    Cardiopathy    CHF (congestive heart failure) (HCC)    SYSTOLIC   Epistaxis [id number]  Hyperlipidemia    Hypertension    Lung nodule    Obesity    OSA (obstructive sleep apnea)    Persistent atrial fibrillation (HCC)    WITH RVR   Pleural effusion    RIGHT UPPER LOBE AND RIGHT LOWER LOBE     SURGICAL HISTORY: Past Surgical History:  Procedure Laterality Date   APPENDECTOMY     ATRIAL FIBRILLATION ABLATION N/A 07/16/2020   Procedure: ATRIAL FIBRILLATION ABLATION;  Surgeon: AThompson Grayer MD;  [facility];  Service: Cardiovascular;  Laterality: N/A;   CARDIOVERSION N/A 10/20/2019   Procedure: CARDIOVERSION;  Surgeon: NAcie FredricksonPWonda Cheng MD;  [place] [facility]  Service: Cardiovascular;  Laterality: N/A;   CARDIOVERSION N/A 05/31/2020   Procedure: CARDIOVERSION;  Surgeon: NJosue Hector MD;  [place] [facility]  Service: Cardiovascular;  Laterality: N/A;   IR ANGIOGRAM PULMONARY BILATERAL SELECTIVE  03/09/2022   IR ANGIOGRAM SELECTIVE EACH ADDITIONAL VESSEL  03/09/2022   IR ANGIOGRAM SELECTIVE EACH ADDITIONAL VESSEL  03/09/2022   IR THROMBECT PRIM MECH INIT (INCLU) MOD SED  03/09/2022   IR [place] ACCESS RIGHT  03/09/2022   RADIOLOGY WITH ANESTHESIA N/A 03/09/2022   Procedure: IR WITH ANESTHESIA;  Surgeon: Radiologist, Medication, MD;  [place] [facility]  Service: Radiology;  Laterality: N/A;   VASECTOMY      SOCIAL HISTORY: Social History   Socioeconomic History   Marital status: Married  Spouse name: Not on file   Number of children: Not on file   Years of education: Not on file   Highest education level: Not on file  Occupational History   Not on file  Tobacco Use   Smoking status: Never    Smokeless tobacco: Never  Substance and Sexual Activity   Alcohol use: Not Currently   Drug use: No   Sexual activity: Not on file  Other Topics Concern   Not on file  Social History Narrative   Lives in [place].   Social Determinants of Health   Financial Resource Strain: Not on file  Food Insecurity: Not on file  Transportation Needs: Not on file  Physical Activity: Not on file  Stress: Not on file  Social Connections: Not on file  Intimate Partner Violence: Not on file    FAMILY HISTORY: Family History  Problem Relation Age of Onset   Hypertrophic cardiomyopathy Mother    Stroke Father    Hypertension Father    Hypertension Sister    Atrial fibrillation Brother     ALLERGIES:  has No Known Allergies.  MEDICATIONS:  Current Outpatient Medications  Medication Sig Dispense Refill   acetaminophen (TYLENOL) 500 MG tablet Take 500 mg by mouth in the morning, at noon, and at bedtime.     atorvastatin (LIPITOR) 40 MG tablet TAKE 1 TABLET BY MOUTH EVERY DAY 30 tablet 7   Carnitine-B5-B6 500-15-5 MG TABS Take 1 tablet by mouth daily.     Cholecalciferol (VITAMIN D3) 25 MCG (1000 UT) CAPS Take 1,000 Units by mouth daily.     Coenzyme Q10 300 MG CAPS Take 1 capsule by mouth daily.     Cyanocobalamin (B-12) 5000 MCG CAPS Take 1 capsule by mouth daily.     diclofenac Sodium (VOLTAREN) 1 % GEL Apply 4 g topically 4 (four) times daily. 350 g 0   ELIQUIS 5 MG TABS tablet TAKE 1 TABLET BY MOUTH TWICE A DAY 99991111 tablet 2   Garlic 123XX123 MG CAPS Take 1 capsule by mouth daily.     Glucosamine-Chondroitin (COSAMIN DS PO) Take 2 tablets by mouth daily.     Hawthorn 150 MG CAPS Take 1 capsule by mouth daily at 6 (six) AM.     meclizine (ANTIVERT) 25 MG tablet TAKE 1 TABLET BY MOUTH 3 TIMES DAILY AS NEEDED FOR DIZZINESS. 90 tablet 1   metoprolol succinate (TOPROL-XL) 50 MG 24 hr tablet TAKE 1 TABLET BY MOUTH EVERY DAY WITH OR IMMEDIATELY FOLLOWING A MEAL 30 tablet 5   Multiple  Minerals-Vitamins (CAL MAG ZINC +D3 PO) Take 1 tablet by mouth daily.     Multiple Vitamin (MULTIVITAMIN WITH MINERALS) TABS tablet Take 1 tablet by mouth daily.     Omega-3 1400 MG CAPS Take 1 capsule by mouth daily.     oxybutynin (DITROPAN) 5 MG tablet Take 5 mg by mouth 3 (three) times daily.     polyethylene glycol powder (GLYCOLAX/MIRALAX) 17 GM/SCOOP powder Take 17 g by mouth daily. 238 g 0   sacubitril-valsartan (ENTRESTO) 49-51 MG Take 1 tablet by mouth 2 (two) times daily. Please keep upcoming appt with Dr. Acie Fredrickson in April 2023 before anymore refills. Thank you Final attempt 90 tablet 3   sacubitril-valsartan (ENTRESTO) 97-103 MG Take 1 tablet by mouth 2 (two) times daily. 180 tablet 3   saline (AYR) GEL Place 1 Application into the nose every 4 (four) hours as needed (For nasal dryness).  0   Taurine 1000  MG CAPS Take 1 capsule by mouth daily.     vitamin E 180 MG (400 UNITS) capsule Take 400 Units by mouth daily.     No current facility-administered medications for this visit.    REVIEW OF SYSTEMS:   Constitutional: ( - ) fevers, ( - )  chills , ( - ) night sweats Eyes: ( - ) blurriness of vision, ( - ) double vision, ( - ) watery eyes Ears, nose, mouth, throat, and face: ( - ) mucositis, ( - ) sore throat Respiratory: ( - ) cough, ( - ) dyspnea, ( - ) wheezes Cardiovascular: ( - ) palpitation, ( - ) chest discomfort, ( - ) lower extremity swelling Gastrointestinal:  ( - ) nausea, ( - ) heartburn, ( - ) change in bowel habits Skin: ( - ) abnormal skin rashes Lymphatics: ( - ) new lymphadenopathy, ( - ) easy bruising Neurological: ( - ) numbness, ( - ) tingling, ( - ) new weaknesses Behavioral/Psych: ( - ) mood change, ( - ) new changes  All other systems were reviewed with the patient and are negative.  PHYSICAL EXAMINATION: ECOG PERFORMANCE STATUS: 0 - Asymptomatic  There were no vitals filed for this visit. There were no vitals filed for this visit.  GENERAL: well  appearing male in NAD  SKIN: skin color, texture, turgor are normal, no rashes or significant lesions EYES: conjunctiva are pink and non-injected, sclera clear LUNGS: clear to auscultation and percussion with normal breathing effort HEART: regular rate & rhythm and no murmurs and no lower extremity edema Musculoskeletal: no cyanosis of digits and no clubbing  PSYCH: alert & oriented x 3, fluent speech NEURO: no focal motor/sensory deficits  LABORATORY DATA:  I have reviewed the data as listed    Latest Ref Rng & Units 03/14/2022    6:51 AM 03/13/2022   12:13 AM 03/12/2022   12:47 AM  CBC  WBC 4.0 - 10.5 K/uL 9.5  12.0  12.2   Hemoglobin 13.0 - 17.0 g/dL 11.0  10.6  10.3   Hematocrit 39.0 - 52.0 % 32.9  32.9  31.5   Platelets 150 - 400 K/uL 474  366  276        Latest Ref Rng & Units 03/12/2022   12:47 AM 03/08/2022    7:32 PM 12/26/2021    7:23 AM  CMP  Glucose 70 - 99 mg/dL 127  115  122   BUN 6 - 20 mg/dL 16  20  14   $ Creatinine 0.61 - 1.24 mg/dL 0.79  0.84  1.10   Sodium 135 - 145 mmol/L 134  137  138   Potassium 3.5 - 5.1 mmol/L 4.0  4.2  4.2   Chloride 98 - 111 mmol/L 95  103  102   CO2 22 - 32 mmol/L 29  22  24   $ Calcium 8.9 - 10.3 mg/dL 9.3  9.8  10.5   Total Protein 6.5 - 8.1 g/dL  6.5    Total Bilirubin 0.3 - 1.2 mg/dL  0.9    Alkaline Phos 38 - 126 U/L  76    AST 15 - 41 U/L  26    ALT 0 - 44 U/L  30     ASSESSMENT & PLAN Jeffrey Griffin is a 62 y.o. male who presents to the hematology clinic for evaluation of recurrent venous thromboembolism.  We reviewed patient's history in detail do not feel that he failed Eliquis therapy since secondary to interruption  of anticoagulation therapy and undergoing surgery.  Patient has no family history of clotting disorders.  We will proceed with a focused hypercoagulable workup to check for antiphospholipid syndrome. If there is no evidence of antiphospholipid syndrome, the recommendation is to proceed with Eliquis 5 mg p.o.  daily.  #Recurrent PE/DVT: --Recommend indefinite anticoagulation due to recurrent thrombotic episodes --Currently on Eliquis 5 mg PO twice daily. Due to large clot burden with most recent episode, recommend to continue on full strength dose. Consider maintenance dose of 2.5 mg PO twice daily if risk of bleeding is a concern in the future.  --Labs today to check CBC, CMP, beta-2-glycoprotein abs and cardiolipin abs.  --RTC in 6 months with labs unless above workup requires more intervention  #Prostate cancer: --Underwent RARP plus bilateral pelvic lymph node dissections on 03/01/22 at [facility]. Pathology shows a pT3bN0 Margins Negative Gleason 4+5 = 9 . --Now under the care of [facility], currently under surveillance.  --Most recent PSA from 06/27/2022 was 0.5.   No orders of the defined types were placed in this encounter.   All questions were answered. The patient knows to call the clinic with any problems, questions or concerns.  I have spent a total of 60 minutes minutes of face-to-face and non-face-to-face time, preparing to see the patient, obtaining and/or reviewing separately obtained history, performing a medically appropriate examination, counseling and educating the patient, ordering tests/procedures, documenting clinical information in the electronic health record, and care coordination.   Dede Query, PA-C Department of Hematology/Oncology [place] [facility] Phone: [phone]  Patient was seen with Dr. Lorenso Courier  I have read the above note and personally examined the patient. I agree with the assessment and plan as noted above.  Briefly Jeffrey Griffin is a 62 year old male with medical history significant for prostate cancer status post resection and recurrent DVT/PE.  At this time would recommend proceeding with indefinite anticoagulation therapy.  He has completed a greater than 10-monthcourse of Eliquis therapy and  can be considered for maintenance therapy moving forward.  Additionally in terms of his prostate cancer he underwent his resection on 03/01/2022 with pathology showing a T3 N0 with a Gleason of 9.  His most recent PSA on 06/27/2022 showed a score of 0.5.  He is currently under surveillance with [facility].  The patient voiced understanding of the need for indefinite anticoagulation moving forward.  Will plan to see him back in 6 months time to reevaluate.   Jeffrey Peoples MD Department of Hematology/Oncology [facility]: [phone]: [phone]: [email or web address]

## 2022-10-10 ENCOUNTER — Inpatient Hospital Stay: Payer: No Typology Code available for payment source | Attending: Physician Assistant | Admitting: Physician Assistant

## 2022-10-10 ENCOUNTER — Encounter: Payer: Self-pay | Admitting: Physician Assistant

## 2022-10-10 ENCOUNTER — Inpatient Hospital Stay: Payer: No Typology Code available for payment source

## 2022-10-10 DIAGNOSIS — I2699 Other pulmonary embolism without acute cor pulmonale: Secondary | ICD-10-CM | POA: Diagnosis present

## 2022-10-10 DIAGNOSIS — I82443 Acute embolism and thrombosis of tibial vein, bilateral: Secondary | ICD-10-CM

## 2022-10-10 DIAGNOSIS — Z7901 Long term (current) use of anticoagulants: Secondary | ICD-10-CM

## 2022-10-10 DIAGNOSIS — E785 Hyperlipidemia, unspecified: Secondary | ICD-10-CM | POA: Diagnosis not present

## 2022-10-10 DIAGNOSIS — C61 Malignant neoplasm of prostate: Secondary | ICD-10-CM

## 2022-10-10 DIAGNOSIS — G4733 Obstructive sleep apnea (adult) (pediatric): Secondary | ICD-10-CM | POA: Diagnosis not present

## 2022-10-10 DIAGNOSIS — I10 Essential (primary) hypertension: Secondary | ICD-10-CM | POA: Diagnosis not present

## 2022-10-10 DIAGNOSIS — Z79899 Other long term (current) drug therapy: Secondary | ICD-10-CM | POA: Diagnosis not present

## 2022-10-10 DIAGNOSIS — I4891 Unspecified atrial fibrillation: Secondary | ICD-10-CM | POA: Diagnosis not present

## 2022-10-10 LAB — CBC WITH DIFFERENTIAL (CANCER CENTER ONLY)
Abs Immature Granulocytes: 0.01 10*3/uL (ref 0.00–0.07)
Basophils Absolute: 0 10*3/uL (ref 0.0–0.1)
Basophils Relative: 1 %
Eosinophils Absolute: 0.3 10*3/uL (ref 0.0–0.5)
Eosinophils Relative: 5 %
HCT: 46.5 % (ref 39.0–52.0)
Hemoglobin: 15.5 g/dL (ref 13.0–17.0)
Immature Granulocytes: 0 %
Lymphocytes Relative: 27 %
Lymphs Abs: 1.5 10*3/uL (ref 0.7–4.0)
MCH: 30.2 pg (ref 26.0–34.0)
MCHC: 33.3 g/dL (ref 30.0–36.0)
MCV: 90.6 fL (ref 80.0–100.0)
Monocytes Absolute: 0.6 10*3/uL (ref 0.1–1.0)
Monocytes Relative: 11 %
Neutro Abs: 3.2 10*3/uL (ref 1.7–7.7)
Neutrophils Relative %: 56 %
Platelet Count: 245 10*3/uL (ref 150–400)
RBC: 5.13 MIL/uL (ref 4.22–5.81)
RDW: 12.5 % (ref 11.5–15.5)
WBC Count: 5.6 10*3/uL (ref 4.0–10.5)
nRBC: 0 % (ref 0.0–0.2)

## 2022-10-10 LAB — CMP (CANCER CENTER ONLY)
ALT: 24 U/L (ref 0–44)
AST: 23 U/L (ref 15–41)
Albumin: 4.1 g/dL (ref 3.5–5.0)
Alkaline Phosphatase: 84 U/L (ref 38–126)
Anion gap: 5 (ref 5–15)
BUN: 14 mg/dL (ref 6–20)
CO2: 29 mmol/L (ref 22–32)
Calcium: 10.5 mg/dL — ABNORMAL HIGH (ref 8.9–10.3)
Chloride: 106 mmol/L (ref 98–111)
Creatinine: 0.8 mg/dL (ref 0.61–1.24)
GFR, Estimated: >60 mL/min (ref >60)
Glucose, Bld: 98 mg/dL (ref 70–99)
Potassium: 4.5 mmol/L (ref 3.5–5.1)
Sodium: 140 mmol/L (ref 135–145)
Total Bilirubin: 0.7 mg/dL (ref 0.3–1.2)
Total Protein: 6.7 g/dL (ref 6.5–8.1)

## 2022-10-11 LAB — BETA-2-GLYCOPROTEIN I ABS, IGG/M/A
Beta-2 Glyco I IgG: <9 GPI IgG units (ref 0–20)
Beta-2-Glycoprotein I IgA: <9 GPI IgA units (ref 0–25)
Beta-2-Glycoprotein I IgM: <9 GPI IgM units (ref 0–32)

## 2022-10-12 LAB — CARDIOLIPIN ANTIBODIES, IGG, IGM, IGA
Anticardiolipin IgA: <9 APL U/mL (ref 0–11)
Anticardiolipin IgG: <9 GPL U/mL (ref 0–14)
Anticardiolipin IgM: <9 MPL U/mL (ref 0–12)

## 2022-10-13 ENCOUNTER — Telehealth: Payer: Self-pay | Admitting: Hematology and Oncology

## 2022-10-13 NOTE — Telephone Encounter (Signed)
Per LOS notes from 10/10/22 scheduled labs and follow up , left voicemail on wifes phone sent to [facility]

## 2022-11-17 ENCOUNTER — Other Ambulatory Visit: Payer: Self-pay | Admitting: Cardiovascular Disease

## 2022-11-18 ENCOUNTER — Encounter: Payer: Self-pay | Admitting: Cardiovascular Disease

## 2022-11-20 ENCOUNTER — Other Ambulatory Visit: Payer: Self-pay

## 2022-11-20 ENCOUNTER — Encounter: Payer: Self-pay | Admitting: Cardiovascular Disease

## 2022-11-20 MED ORDER — ENTRESTO 49-51 MG PO TABS: 1.0000 | ORAL_TABLET | Freq: Two times a day (BID) | ORAL | 1 refills | Status: DC

## 2022-11-21 ENCOUNTER — Encounter: Payer: Self-pay | Admitting: Cardiovascular Disease

## 2022-11-23 MED ORDER — SACUBITRIL-VALSARTAN 97-103 MG PO TABS: 1.0000 | ORAL_TABLET | Freq: Two times a day (BID) | ORAL | 4 refills | Status: DC

## 2022-11-23 NOTE — Telephone Encounter (Signed)
Pt seen in office last by Dr Curt Bears on 07/17/22 with no changes made to medication regimen, and previously by Dr Acie Fredrickson on 03/21/22 with the 97/103mg  on chart that day as "current medications." Refill was sent for incorrect/old strength. Updated at this time and provided enough until pt's appt in July with Dr Acie Fredrickson.

## 2022-12-18 ENCOUNTER — Ambulatory Visit (INDEPENDENT_AMBULATORY_CARE_PROVIDER_SITE_OTHER): Payer: No Typology Code available for payment source | Admitting: Family Medicine

## 2022-12-18 VITALS — BP 136/75 | HR 67 | Ht 69.0 in | Wt 247.0 lb

## 2022-12-18 DIAGNOSIS — R21 Rash and other nonspecific skin eruption: Secondary | ICD-10-CM | POA: Diagnosis not present

## 2022-12-18 DIAGNOSIS — L42 Pityriasis rosea: Secondary | ICD-10-CM | POA: Insufficient documentation

## 2022-12-18 MED ORDER — TRIAMCINOLONE ACETONIDE 0.5 % EX OINT: 1.0000 | TOPICAL_OINTMENT | Freq: Two times a day (BID) | CUTANEOUS | 1 refills | Status: DC

## 2022-12-18 NOTE — Progress Notes (Signed)
    SUBJECTIVE:   CHIEF COMPLAINT / HPI:   Knee rash - Using knee brace on right knee since twisting injury about a week ago, wore 12 hours first time - Rash on knee over where brace was, now spreading down leg and to left leg - Washes brace regularly - Very itchy - tried hydrocortisone cream, antifungal cream, anti-itch cream rx from wife (triamcinolone with something else for foot and nail fungus, cannot recall exactly) all with minimal relief - No constitutional symptoms, denies fevers, chills, joint swelling, muscle aches, urinary issues, stomach pain, nausea, vomiting, diarrhea, fatigue - One 24 hour illness maybe one month ago - no other prodromal illnesses  PERTINENT  PMH / PSH:  Patient Active Problem List   Diagnosis Date Noted   Pulmonary embolism 03/09/2022    Priority: High   S/P prostatectomy     Priority: High   Persistent atrial fibrillation 10/01/2019    Priority: 2.   Chronic combined systolic and diastolic CHF (congestive heart failure) 04/06/2020    Priority: 3.   OSA (obstructive sleep apnea) 03/09/2022    Priority: 4.   Rash 12/18/2022   Knee pain, bilateral 03/12/2022   PE (pulmonary thromboembolism) 03/09/2022   Paroxysmal atrial fibrillation    Fatigue 03/04/2021   Subcutaneous nodule of left foot 03/04/2021   Atypical atrial flutter 05/26/2020   Secondary hypercoagulable state 04/13/2020   Pulmonary embolus 10/01/2019    OBJECTIVE:   BP 136/75   Pulse 67   Ht 5\' 9"  (1.753 m)   Wt 247 lb (112 kg)   SpO2 94%   BMI 36.48 kg/m    Gen: Awake, alert, NAD Respiratory: Speaking clearly in full sentences, no respiratory distress Ext: Nonblanching coalescing erythematous macules and papules over right anterior, medial, and lateral knee, extending and less concentrated scattered clusters down the leg, also present on left leg in less concentrated scattered groupings       ASSESSMENT/PLAN:   Rash Given distribution and HPI, most likely contact  dermatitis.  Physical exam and vitals reassuring, along with lack of constitutional symptoms or prodromal illness. - Triamcinolone 0.5% cream twice daily followed by occlusive emollient such as Aquaphor or Eucerin - Return precautions given, see AVS for more   Fayette Pho, MD [facility]

## 2022-12-18 NOTE — Patient Instructions (Addendum)
It was wonderful to see you today. Thank you for allowing me to be a part of your care. Below is a short summary of what we discussed at your visit today:  Rash Start triamcinolone cream over the areas as prescribed.  Apply a thick moisturizer such as Eucerin, Aquaphor, or Aveeno on top after the triamcinolone soaks and.  Return if the rash gets worse, shows no improvement, or if you develop symptoms such as fever, chills, or other illness.     Please bring all of your medications to every appointment!  If you have any questions or concerns, please do not hesitate to contact us via phone or MyChart message.   Fayette Pho, MD

## 2022-12-18 NOTE — Assessment & Plan Note (Addendum)
Given distribution and HPI, most likely contact dermatitis.  Physical exam and vitals reassuring, along with lack of constitutional symptoms or prodromal illness. - Triamcinolone 0.5% cream twice daily followed by occlusive emollient such as Aquaphor or Eucerin - Return precautions given, see AVS for more

## 2022-12-28 ENCOUNTER — Encounter: Payer: Self-pay | Admitting: Family Medicine

## 2022-12-28 ENCOUNTER — Other Ambulatory Visit: Payer: Self-pay | Admitting: Cardiovascular Disease

## 2023-01-16 ENCOUNTER — Encounter: Payer: Self-pay | Admitting: Family Medicine

## 2023-01-16 ENCOUNTER — Ambulatory Visit: Payer: No Typology Code available for payment source | Admitting: Family Medicine

## 2023-01-16 VITALS — BP 138/84 | HR 65 | Ht 69.0 in | Wt 244.0 lb

## 2023-01-16 DIAGNOSIS — R21 Rash and other nonspecific skin eruption: Secondary | ICD-10-CM | POA: Diagnosis not present

## 2023-01-16 MED ORDER — PREDNISONE 20 MG PO TABS: 40.0000 mg | ORAL_TABLET | Freq: Every day | ORAL | 0 refills | Status: AC

## 2023-01-16 NOTE — Progress Notes (Signed)
    SUBJECTIVE:   CHIEF COMPLAINT / HPI:   Rash follow-up - Itching has stopped - Now having the rash spread up to his chest, slow spread 1-2 weeks after the last visit in April - Notes that the rash is less red in the morning  - No diet changes, products in the house, other exposures - Was yard working a little bit at the time but  - No one else has a similar rash  PERTINENT  PMH / PSH: Reviewed  OBJECTIVE:   BP 138/84   Pulse 65   Ht 5\' 9"  (1.753 m)   Wt 244 lb (110.7 kg) Comment: Pt reported  SpO2 97%   BMI 36.03 kg/m   General: NAD, well-appearing, well-nourished Respiratory: No respiratory distress, breathing comfortably, able to speak in full sentences Skin: warm and dry, erythematous papules present over patient's extremities and trunk (spares the face) with some scale present.       ASSESSMENT/PLAN:   Rash Distribution has increased and is now all over all extremities and the trunk.  Does not seem consistent with contact dermatitis given the expansion that has been gradual and the lack of pruritus.  Has not used triamcinolone cream in the last 2 weeks.  Unclear etiology, consideration for pityriasis rosacea or other benign rash. - Follow-up scheduled in clinic for biopsy on 5/15, as unable to complete clinic today - Prednisone 40 mg daily x 5 days after biopsy     Evelena Leyden, DO [facility]

## 2023-01-16 NOTE — Assessment & Plan Note (Signed)
Distribution has increased and is now all over all extremities and the trunk.  Does not seem consistent with contact dermatitis given the expansion that has been gradual and the lack of pruritus.  Has not used triamcinolone cream in the last 2 weeks.  Unclear etiology, consideration for pityriasis rosacea or other benign rash. - Follow-up scheduled in clinic for biopsy on 5/15, as unable to complete clinic today - Prednisone 40 mg daily x 5 days after biopsy

## 2023-01-16 NOTE — Patient Instructions (Signed)
If you want to get the biopsy we have an appointment scheduled for tomorrow.   I have sent in steroids for the rash, make sure to take it only after you have the biopsy done.

## 2023-01-17 ENCOUNTER — Ambulatory Visit: Payer: No Typology Code available for payment source | Admitting: Student

## 2023-01-17 VITALS — BP 128/78 | HR 67 | Wt 244.0 lb

## 2023-01-17 DIAGNOSIS — Z8639 Personal history of other endocrine, nutritional and metabolic disease: Secondary | ICD-10-CM

## 2023-01-17 DIAGNOSIS — B079 Viral wart, unspecified: Secondary | ICD-10-CM | POA: Diagnosis not present

## 2023-01-17 DIAGNOSIS — R21 Rash and other nonspecific skin eruption: Secondary | ICD-10-CM

## 2023-01-17 DIAGNOSIS — E785 Hyperlipidemia, unspecified: Secondary | ICD-10-CM | POA: Diagnosis not present

## 2023-01-17 NOTE — Assessment & Plan Note (Signed)
Diagnosis: Verruca Procedure: Cryotherapy Location: R fourth finger  After discussion of the risks, benefits, and alternative therapies available, the patient elected to proceed. After obtaining written informed consent, the patient's identity, procedure, and site were verified during a time out prior to proceeding procedure. Lesion unroofed using #15 scalpel. The lesion was treated using liquid nitrogen spray gun for 5 second per cycle, 3 cycles total. The patient tolerated the procedure well and there were no immediate complications.  Patient was provided aftercare instructions and advised to return if lesion(s) did not fully resolved.

## 2023-01-17 NOTE — Assessment & Plan Note (Addendum)
Here for biopsy today.  Differential includes pityriasis rosea versus prurigo pigmentosa versus other.  Pre-op Diagnosis: Pityriasis Rosea Post-op Diagnosis: Same Procedure: Punch Skin Biopsy Location: Left Chest Performing Physician: Eliezer Mccoy, MD    Informed consent was obtained prior to the procedure. Time out performed. The area surrounding the skin lesion was prepared and cleaned in the usual fashion. The area was sufficiently anesthetized with 1% Lidocaine with epinephrine.  A size 3mm disposable biopsy punch tool was used to obtain tissue sample and placed in labeled biopsy cup. Drysol was used to obtain hemostasis. The site was not closed. The patient tolerated the procedure well without complications.  Standard post-procedure care was explained and return precautions and wound care handout given.   - Biopsy sent for path - Start prednisone 40mg  daily x5 days - Has PCP f/u in two weeks for physical

## 2023-01-17 NOTE — Patient Instructions (Signed)
Jeffrey Griffin,  We biopsied your rash today.  This area may be sore for the next few days.  Just keep some Vaseline on it and a Band-Aid.  It should scab over and heal well.  If it starts to ooze blood, just hold some pressure over the area until it stops. We also froze your wart.  This area will get angry and may blister over the next 2 days.  As with the biopsy site, Vaseline is your friend. I am sending off some lab work for you to review with Dr. Clayborne Artist at your physical in a few weeks.  You can go ahead and take the steroids that Dr. Clayborne Artist sent in for you. 40mg /day for 5 days.  Eliezer Mccoy, MD

## 2023-01-17 NOTE — Progress Notes (Addendum)
    SUBJECTIVE:   CHIEF COMPLAINT / HPI:   Rash  Patient presents today with a diffuse papular rash.  Has been present since at least 4/15.  Was initially thought to be a contact dermatitis in the setting of having been wearing a knee brace.  However the rash has not spread and is now covering most of his body. It is not particularly itchy.  No sick contacts.  No known contact exposures. Saw Dr. Clayborne Artist yesterday who thought that perhaps this represented pityriasis rosea.  Planned for him to come back today for a punch biopsy and initiation of oral steroids. She already sent in the Rx for prednisone.   Wart Present for a while. He tries to shave it down at home. Has used multiple OTC options including industrial duster to freeze it off with no luck.   PERTINENT  PMH / PSH: Prostate CA s/p prostatectomy, PE on Eliquis   OBJECTIVE:   BP 128/78   Pulse 67   Wt 244 lb (110.7 kg) Comment: Pt reported  SpO2 95%   BMI 36.03 kg/m   Gen: In good spirits Skin: Verruca to fourth finger, R hand Diffuse erythematous papular rash, most noticeable over L shoulder. Also present on BLE, BUE, Chest, and upper back  ASSESSMENT/PLAN:   Rash Here for biopsy today.  Differential includes pityriasis rosea versus prurigo pigmentosa versus other.  Pre-op Diagnosis: Pityriasis Rosea Post-op Diagnosis: Same Procedure: Punch Skin Biopsy Location: Left Chest Performing Physician: Eliezer Mccoy, MD    Informed consent was obtained prior to the procedure. Time out performed. The area surrounding the skin lesion was prepared and cleaned in the usual fashion. The area was sufficiently anesthetized with 1% Lidocaine with epinephrine.  A size 3mm disposable biopsy punch tool was used to obtain tissue sample and placed in labeled biopsy cup. Drysol was used to obtain hemostasis. The site was not closed. The patient tolerated the procedure well without complications.  Standard post-procedure care was explained  and return precautions and wound care handout given.   - Biopsy sent for path - Start prednisone 40mg  daily x5 days - Has PCP f/u in two weeks for physical  Verruca Diagnosis: Verruca Procedure: Cryotherapy Location: R fourth finger  After discussion of the risks, benefits, and alternative therapies available, the patient elected to proceed. After obtaining written informed consent, the patient's identity, procedure, and site were verified during a time out prior to proceeding procedure. Lesion unroofed using #15 scalpel. The lesion was treated using liquid nitrogen spray gun for 5 second per cycle, 3 cycles total. The patient tolerated the procedure well and there were no immediate complications.  Patient was provided aftercare instructions and advised to return if lesion(s) did not fully resolved.   Upcoming Physical Obtaining Vit D level and Lipid panel ahead of his upcoming physical with PCP Dr. Clayborne Artist. Has recent CMP and CBC from the [facility].    Eliezer Mccoy, MD [facility]

## 2023-01-18 LAB — HIV ANTIBODY (ROUTINE TESTING W REFLEX): HIV Screen 4th Generation wRfx: NONREACTIVE

## 2023-01-18 LAB — VITAMIN D 25 HYDROXY (VIT D DEFICIENCY, FRACTURES): Vit D, 25-Hydroxy: 46.3 ng/mL (ref 30.0–100.0)

## 2023-01-18 LAB — LIPID PANEL
Chol/HDL Ratio: 4.2 ratio (ref 0.0–5.0)
Cholesterol, Total: 143 mg/dL (ref 100–199)
HDL: 34 mg/dL — ABNORMAL LOW (ref >39)
LDL Chol Calc (NIH): 83 mg/dL (ref 0–99)
Triglycerides: 148 mg/dL (ref 0–149)
VLDL Cholesterol Cal: 26 mg/dL (ref 5–40)

## 2023-01-18 LAB — RPR: RPR Ser Ql: NONREACTIVE

## 2023-02-08 ENCOUNTER — Encounter: Payer: Self-pay | Admitting: Family Medicine

## 2023-02-08 ENCOUNTER — Ambulatory Visit (INDEPENDENT_AMBULATORY_CARE_PROVIDER_SITE_OTHER): Payer: No Typology Code available for payment source | Admitting: Family Medicine

## 2023-02-08 VITALS — BP 139/92 | HR 63 | Ht 69.0 in | Wt 245.0 lb

## 2023-02-08 DIAGNOSIS — L42 Pityriasis rosea: Secondary | ICD-10-CM

## 2023-02-08 DIAGNOSIS — R5383 Other fatigue: Secondary | ICD-10-CM

## 2023-02-08 DIAGNOSIS — R030 Elevated blood-pressure reading, without diagnosis of hypertension: Secondary | ICD-10-CM

## 2023-02-08 DIAGNOSIS — Z0001 Encounter for general adult medical examination with abnormal findings: Secondary | ICD-10-CM

## 2023-02-08 NOTE — Assessment & Plan Note (Signed)
Patient continues to have issues with fatigue and also has noted decreased sexual interest and muscle loss.  Patient is questioning whether or not his testosterone may be lower.  Discussed with him that as he has seen urology for his prostate management and had a recent prostatectomy I would recommend getting their blessing before pursuing testing with possible treatment.

## 2023-02-08 NOTE — Progress Notes (Signed)
    SUBJECTIVE:   CHIEF COMPLAINT / HPI:   Patient presents for annual physical today and follow-up of several recent issues.  - Pityriasis rosea: Recent diffuse skin rash with biopsy that showed consistency with diagnosis of pityriasis rosea.  Patient continues to have lesions but no pruritus.  Patient is still bothered by the appearance of the rash but reassuringly does not have any other symptoms.  Rash has been present for 2 months.  - Testosterone concerns: Patient is concerned that he may have low testosterone given recent fatigue, muscle loss, lack of sexual interest.  Patient is s/p prostatectomy and follows with [facility].  Upon further discussion, patient has more interested in quality of life then duration  -Tinnitus: Seen by ENT and noted to have septal deviation and polyps.  Also previously seen by audiology but level of hearing loss does not meet requirement for hearing aids.  Was referred to the [facility] tinnitus clinic.  -Elevated blood pressure measurements: BP at home measurements are in the 120s/70s though they are elevated in the office usually.  Patient sees his cardiologist next month and otherwise has been doing very well.   PERTINENT  PMH / PSH: Reviewed  OBJECTIVE:   BP (!) 139/92   Pulse 63   Ht 5\' 9"  (1.753 m)   Wt 245 lb (111.1 kg)   SpO2 97%   BMI 36.18 kg/m   Gen: well-appearing, NAD CV: RRR, no m/r/g appreciated, no peripheral edema Pulm: CTAB, no wheezes/crackles GI: soft, non-tender, non-distended  ASSESSMENT/PLAN:   Pityriasis rosea Patient with rash that is consistent with pityriasis rosea biopsy.  Patient is 2 months out from initial rash appearance.  Discussed that most cases spontaneously resolve within 3 months.  If symptoms persist and are bothersome after that.,  May do allergy further treatment modality.  Reassuringly does not have significant pruritus with rash and is not requiring any medications at this time.  Fatigue Patient  continues to have issues with fatigue and also has noted decreased sexual interest and muscle loss.  Patient is questioning whether or not his testosterone may be lower.  Discussed with him that as he has seen urology for his prostate management and had a recent prostatectomy I would recommend getting their blessing before pursuing testing with possible treatment.  Elevated blood pressure readings Blood pressure measurements elevated in the office, checks regularly at home and measurements are normotensive there.  Currently being managed by cardiology for CHF and PAF.  At this time given good control outside adjust any medications.  Healthcare maintenance Patient is up-to-date on most healthcare maintenance.  Only due for shingles vaccine, which was not discussed at the   Evelena Leyden, DO [facility]

## 2023-02-08 NOTE — Patient Instructions (Addendum)
In regards to the testosterone, there is a possibility that it could be safe but I would not want to proceed in our clinic with testing unless we had a blessing from your urologist in regards to treatment.  We do not have great cutoffs for saying whether or not someone has truly low testosterone as a lot of it depends on the symptoms as well.  For the pityriasis rosea, the anticipation is that this would improve over the next month.  Most cases spontaneously resolve within 3 months of when they start but some do persist a little bit longer.  If it persists, we may need to consider dermatology referral.  At this point, best recommendation is to treat itching if it is present and to try and stay out of the sun at least protect the skin as much she can as it can make the lesions more noticeable.

## 2023-02-08 NOTE — Assessment & Plan Note (Addendum)
Patient with rash that is consistent with pityriasis rosea biopsy.  Patient is 2 months out from initial rash appearance.  Discussed that most cases spontaneously resolve within 3 months.  If symptoms persist and are bothersome after that.,  May do allergy further treatment modality.  Reassuringly does not have significant pruritus with rash and is not requiring any medications at this time.

## 2023-03-12 DIAGNOSIS — M6281 Muscle weakness (generalized): Secondary | ICD-10-CM

## 2023-03-12 HISTORY — DX: Muscle weakness (generalized): M62.81

## 2023-03-16 ENCOUNTER — Ambulatory Visit: Payer: No Typology Code available for payment source | Admitting: Cardiovascular Disease

## 2023-03-21 ENCOUNTER — Encounter: Payer: Self-pay | Admitting: Cardiovascular Disease

## 2023-03-21 ENCOUNTER — Other Ambulatory Visit: Payer: Self-pay | Admitting: Cardiovascular Disease

## 2023-03-22 MED ORDER — SACUBITRIL-VALSARTAN 97-103 MG PO TABS: 1.0000 | ORAL_TABLET | Freq: Two times a day (BID) | ORAL | 0 refills | Status: DC

## 2023-03-22 NOTE — Telephone Encounter (Signed)
Pt last seen [facility] 11/23: HFrecEF - denies s/s of worsening HF, cont toprol and entresto  Prior visit with Dr Elease Hashimoto was 7/23 and no changes to medication regimen made at that time. Will send refill for 3 months' supply per request and pt already scheduled with Perlie Gold, PA later this month for annual visit.

## 2023-03-26 ENCOUNTER — Encounter: Payer: Self-pay | Admitting: Cardiology

## 2023-03-26 ENCOUNTER — Other Ambulatory Visit: Payer: Self-pay

## 2023-03-26 MED ORDER — APIXABAN 5 MG PO TABS: 5.0000 mg | ORAL_TABLET | Freq: Two times a day (BID) | ORAL | 2 refills | Status: DC

## 2023-03-26 NOTE — Telephone Encounter (Signed)
Prescription refill request for Eliquis received. Indication:afib Last office visit:11/23 Scr:0.80  2/24 Age: 62 Weight:111.1  kg  Prescription refilled

## 2023-04-05 ENCOUNTER — Telehealth: Payer: Self-pay | Admitting: Hematology and Oncology

## 2023-04-05 NOTE — Telephone Encounter (Signed)
Patient has decided to follow up with [facility] doctor regarding his treatment; patient stated that If they needed to continue to see provider after appointment they would call back to get appointment rescheduled.

## 2023-04-13 ENCOUNTER — Inpatient Hospital Stay: Payer: No Typology Code available for payment source

## 2023-04-13 ENCOUNTER — Other Ambulatory Visit: Payer: No Typology Code available for payment source

## 2023-04-13 ENCOUNTER — Inpatient Hospital Stay: Payer: No Typology Code available for payment source | Admitting: Hematology and Oncology

## 2023-04-30 NOTE — Progress Notes (Unsigned)
Cardiology Office Note:   Date:  05/01/2023  ID:  Nolberto Mcdade, DOB 1960/12/20, MRN [id number] PCP: Nelia Shi, MD  [facility] Providers Cardiologist:  Kristeen Miss, MD Electrophysiologist:  Regan Lemming, MD    History of Present Illness:   Jeffrey Griffin is a 62 y.o. male with history of recurrent PE, DVT, prostate cancer s/p RARP w/ lymph node dissections, AF s/p ablation 2021, HFrecEF, hypertension, thoracic aortic aneurysm, OSA.   Regarding PE/DVT hx, in January of 2021, patient had a CTA that showed acute subsegmental pulmonary embolism to the right upper and right lower lobes. Started anticoagulation therapy with Eliquis. Then in July 2023 after being off Eliquis for 2 weeks due to prostatectomy, he was found to have extensive bilateral pulmonary emboli with saddle pulmonary embolus with presence of right heart strain. Bilateral lower extremity ultrasound significant for DVT involving posterior tibial veins bilaterally. He underwent thrombectomy.   Regarding afib history, patient first diagnosed 08/2019 with dyspnea. He was found to be in afib but also with acute PE. He underwent DCCV on 10/20/19 and had improvement in LVEF. Patient then had recurrence of afib and underwent ablation with Dr. Johney Frame 07/16/20. Unfortunately found to be back in afib with elevated HR 07/19/20. Decision made in afib clinic to put him back on Amiodarone (had failed DCCV with this). Amiodarone then discontinued again by Dr. Johney Frame in follow up on 10/18/20 with patient back in NSR.   Today patient reports recurrence of palpitations for the first time since 2022 this past Saturday and has ECG rhythm strips from home monitoring device that appears to show recurrent atrial fibrillation (see below). He tried vagal maneuvers and relaxing but symptoms persisted for nearly 24 hours. Regarding triggers, he was very physically active in the sun/heat on Saturday, wonders about dehydration. He tries to drink  80 to 100 oz of water per day but didn't have nearly this much on Saturday. After symptoms resolved on Sunday, he has not had any recurrence.   He reports some swelling in right knee, due to lymphatic dysfunction following resection with prostatectomy. Denies orthopnea or pitting edema. His exertional tolerance remains stable though he expresses some annoyance that he has never really returned to pre-CHF/ablation exercise levels. He denies chest pain, shortness of breath, fatigue, melena, hematuria, hemoptysis, diaphoresis, weakness, presyncope, syncope, orthopnea, and PND.     Studies Reviewed:    EKG:   ECG today with normal sinus rhythm. No acute ST/T wave changes to indicate ischemia.     Risk Assessment/Calculations:    CHA2DS2-VASc Score = 3   This indicates a 3.2% annual risk of stroke. The patient's score is based upon: CHF History: 1 HTN History: 1 Diabetes History: 0 Stroke History: 0 Vascular Disease History: 1 Age Score: 0 Gender Score: 0     Physical Exam:   VS:  BP 122/70   Pulse 72   Ht 5\' 9"  (1.753 m)   Wt 244 lb (110.7 kg)   SpO2 95%   BMI 36.03 kg/m    Wt Readings from Last 3 Encounters:  05/01/23 244 lb (110.7 kg)  02/08/23 245 lb (111.1 kg)  01/17/23 244 lb (110.7 kg)     Physical Exam Vitals reviewed.  Constitutional:      Appearance: Normal appearance.  HENT:     Head: Normocephalic.  Eyes:     Pupils: Pupils are equal, round, and reactive to light.  Cardiovascular:     Rate and Rhythm: Normal rate and regular rhythm.  Pulses: Normal pulses.     Heart sounds: Normal heart sounds.  Pulmonary:     Effort: Pulmonary effort is normal.     Breath sounds: Normal breath sounds.  Abdominal:     General: Abdomen is flat.     Palpations: Abdomen is soft.  Musculoskeletal:     Right lower leg: No edema.     Left lower leg: No edema.  Skin:    General: Skin is warm and dry.     Capillary Refill: Capillary refill takes less than 2 seconds.   Neurological:     General: No focal deficit present.     Mental Status: He is alert and oriented to person, place, and time.  Psychiatric:        Mood and Affect: Mood normal.        Behavior: Behavior normal.        Thought Content: Thought content normal.        Judgment: Judgment normal.      ASSESSMENT AND PLAN:   Chronic combined systolic and diastolic CHF (congestive heart failure) (HCC) Patient with LVEF down to 20-25% in the setting of afib in 2021. Had improvement to 40% following restoration of NSR. Myoview stress test without focal ischemia, intermediate risk. As of 03/10/22 TTE, LVEF 55-60% with no RWMA and normal diastolic function. Moderate LVH. Outside labs from this May reviewed. Creatinine 0.8, eGFR 78. Continue medications as below.  GDMT:  Toprol XL 50mg  Entresto 97-103mg   Pulmonary embolism [facility]) Patient with history of recurrent PE/DVT, last in July 2023 while Eliquis held for prostate procedure. He is now also followed by hematology and was not found to have lab evidence of antiphospholipid syndrome on labs this year. They do not feel that he failed Eliquis given recurrence occurred while off. No reported bleeding on Eliquis. Per patient, onc plans to repeat coagulopathy labs.   Continue Eliquis 5mg  BID. Strongly recommend that he be bridged with Lovenox if Eliquis has to be stopped for future procedures.  Paroxysmal atrial fibrillation [facility]) Patient s/p ablation with Dr. Johney Frame in 2021 after failing DCCV on Amiodarone. Appeared to have rapid recurrence of afib a few days following ablation but ultimately returned to NSR with repeating oral Amiodarone that was stopped 10/18/20. Patient with first recurrence of afib-like symptoms over the weekend after working outside in the heat. Palpitations and irregular heartbeat sensation lasted about 24 hours then spontaneously resolved. No recurrence. Normal sinus rhythm on ECG today. Discussed that heat/humidity/dehydration may  have been a trigger.  Continue Toprol XL 50mg  Continue Eliquis 5mg  BID Will give patient Rx for Metoprolol Tartrate 25mg  to be taken every 6 hours up to 2 doses for breakthrough tachycardia/palpitations. Continue follow up with EP.   OSA (obstructive sleep apnea) Continue nightly use of CPAP.  Hyperlipidemia Patient's LDL 83 as of 01/17/23 labs with PCP. Continue Atorvastatin 40mg .            Signed, Perlie Gold, PA-C

## 2023-04-30 NOTE — Assessment & Plan Note (Signed)
Patient with LVEF down to 20-25% in the setting of afib in 2021. Had improvement to 40% following restoration of NSR. Myoview stress test without focal ischemia, intermediate risk. As of 03/10/22 TTE, LVEF 55-60% with no RWMA and normal diastolic function. Moderate LVH. Outside labs from this May reviewed. Creatinine 0.8, eGFR 78. Continue medications as below.  GDMT:  Toprol XL 50mg  Entresto 97-103mg 

## 2023-04-30 NOTE — Assessment & Plan Note (Signed)
Patient with history of recurrent PE/DVT, last in July 2023 while Eliquis held for prostate procedure. He is now also followed by hematology and was not found to have lab evidence of antiphospholipid syndrome on labs this year. They do not feel that he failed Eliquis given recurrence occurred while off. No reported bleeding on Eliquis. Per patient, onc plans to repeat coagulopathy labs.   Continue Eliquis 5mg  BID. Strongly recommend that he be bridged with Lovenox if Eliquis has to be stopped for future procedures.

## 2023-04-30 NOTE — Assessment & Plan Note (Addendum)
Patient s/p ablation with Dr. Johney Frame in 2021 after failing DCCV on Amiodarone. Appeared to have rapid recurrence of afib a few days following ablation but ultimately returned to NSR with repeating oral Amiodarone that was stopped 10/18/20.

## 2023-05-01 ENCOUNTER — Encounter: Payer: Self-pay | Admitting: Cardiology

## 2023-05-01 ENCOUNTER — Ambulatory Visit: Payer: No Typology Code available for payment source | Attending: Cardiovascular Disease | Admitting: Cardiology

## 2023-05-01 VITALS — BP 122/70 | HR 72 | Ht 69.0 in | Wt 244.0 lb

## 2023-05-01 DIAGNOSIS — I2602 Saddle embolus of pulmonary artery with acute cor pulmonale: Secondary | ICD-10-CM

## 2023-05-01 DIAGNOSIS — G4733 Obstructive sleep apnea (adult) (pediatric): Secondary | ICD-10-CM | POA: Diagnosis not present

## 2023-05-01 DIAGNOSIS — I480 Paroxysmal atrial fibrillation: Secondary | ICD-10-CM

## 2023-05-01 DIAGNOSIS — E7800 Pure hypercholesterolemia, unspecified: Secondary | ICD-10-CM

## 2023-05-01 DIAGNOSIS — E785 Hyperlipidemia, unspecified: Secondary | ICD-10-CM | POA: Insufficient documentation

## 2023-05-01 DIAGNOSIS — I5042 Chronic combined systolic (congestive) and diastolic (congestive) heart failure: Secondary | ICD-10-CM

## 2023-05-01 MED ORDER — METOPROLOL TARTRATE 25 MG PO TABS: 25.0000 mg | ORAL_TABLET | Freq: Four times a day (QID) | ORAL | 11 refills | Status: DC | PRN

## 2023-05-01 MED ORDER — SACUBITRIL-VALSARTAN 97-103 MG PO TABS: 1.0000 | ORAL_TABLET | Freq: Two times a day (BID) | ORAL | 3 refills | Status: DC

## 2023-05-01 NOTE — Patient Instructions (Signed)
Medication Instructions:  Your physician has recommended you make the following change in your medication:  REFILLED: Entresto  START: metoprolol tartrate 25 mg by mouth every 6 hours for 2 doses daily as needed for breakthrough tachycardia. *If you need a refill on your cardiac medications before your next appointment, please call your pharmacy*   Lab Work: NONE If you have labs (blood work) drawn today and your tests are completely normal, you will receive your results only by: MyChart Message (if you have MyChart) OR A paper copy in the mail If you have any lab test that is abnormal or we need to change your treatment, we will call you to review the results.   Testing/Procedures: NONE   Follow-Up: At [facility], you and your health needs are our priority.  As part of our continuing mission to provide you with exceptional heart care, we have created designated Provider Care Teams.  These Care Teams include your primary Cardiologist (physician) and Advanced Practice Providers (APPs -  Physician Assistants and Nurse Practitioners) who all work together to provide you with the care you need, when you need it.    Your next appointment:   11 month(s)  Provider:   Kristeen Miss, MD  or Perlie Gold, PA-C

## 2023-05-01 NOTE — Assessment & Plan Note (Signed)
Patient's LDL 83 as of 01/17/23 labs with PCP. Continue Atorvastatin 40mg .

## 2023-05-01 NOTE — Assessment & Plan Note (Signed)
Continue nightly use of CPAP.

## 2023-05-22 ENCOUNTER — Encounter: Payer: Self-pay | Admitting: Student

## 2023-05-29 ENCOUNTER — Telehealth: Payer: Self-pay | Admitting: Hematology and Oncology

## 2023-05-30 ENCOUNTER — Ambulatory Visit: Payer: No Typology Code available for payment source

## 2023-05-30 VITALS — BP 130/70 | HR 63 | Temp 98.2°F | Ht 69.0 in | Wt 243.0 lb

## 2023-05-30 DIAGNOSIS — R053 Chronic cough: Secondary | ICD-10-CM

## 2023-05-30 NOTE — Progress Notes (Signed)
    SUBJECTIVE:   CHIEF COMPLAINT / HPI:   Cough Cough for the past six weeks. In the beginning had heavy sputum production and at least one episode of bloody sputum. Since that time cough has become less productive but remains quite bothersome to him. No fevers and feels short of breath "no more than usual." No CP.  Last PE/DVT was in July 2023 when his Eliquis was held for a prostate procedure. He reports excellent adherence to his Eliquis since then with no lapses in coverage over this period. . Of note, he also has a known right lower lobe lung nodule which measured between 4 and 5mm in 2022.  He is a never smoker with no history of COPD. SpO2 92% here today.   PERTINENT  PMH / PSH: Prostate CA s/p RARP w/ lymph node dissection, Hx PE on Eliquis, A  Fib s/p ablation, Combined CHF, OSA  OBJECTIVE:   BP 130/70   Pulse 63   Temp 98.2 F (36.8 C) (Oral)   Ht 5\' 9"  (1.753 m)   Wt 243 lb (110.2 kg)   SpO2 92%   BMI 35.88 kg/m   Gen: Very well appearing and in good spirits HENT: MMM Neck: Without JVD Cardio: RRR, without murmur, rub or gallop Pulm: normal WOB on RA, lungs are clear to auscultation in all fields with good air movement throughout   ASSESSMENT/PLAN:   Chronic cough In most any other patient, I would suspect a post-viral cough syndrome, and that is certainly most likely in this case. However, given this patient's complex history including recurrent PE and lung nodule taken together with his SpO2 of only 92% today, I think he warrants a more fulsome workup. - CTA PE study to be done within the next week at [facility] - ER precautions reviewed  - CT will happen at 6am on 9/30, fortuitously, he already has follow-up with me on 10/1 for an unrelated matter, we can follow-up at that time.       Eliezer Mccoy, MD [facility]

## 2023-05-30 NOTE — Assessment & Plan Note (Addendum)
In most any other patient, I would suspect a post-viral cough syndrome, and that is certainly most likely in this case. However, given this patient's complex history including recurrent PE and lung nodule taken together with his SpO2 of only 92% today, I think he warrants a more fulsome workup. - CTA PE study to be done within the next week at [facility] - ER precautions reviewed  - CT will happen at 6am on 9/30, fortuitously, he already has follow-up with me on 10/1 for an unrelated matter, we can follow-up at that time.

## 2023-05-30 NOTE — Patient Instructions (Addendum)
Mr. Jeffrey, Griffin to see you. I think we should get a CT of your chest (urgently but not emergently) to evaluate for any recurrent PE or (God forbid) spread of your cancer. We'll get this scheduled for you. Of course, if things change rapidly, head to the ER.   Eliezer Mccoy, MD

## 2023-06-01 ENCOUNTER — Ambulatory Visit (INDEPENDENT_AMBULATORY_CARE_PROVIDER_SITE_OTHER): Payer: No Typology Code available for payment source | Admitting: Sports Medicine

## 2023-06-01 VITALS — BP 138/84 | Ht 69.0 in | Wt 242.0 lb

## 2023-06-01 DIAGNOSIS — M545 Low back pain, unspecified: Secondary | ICD-10-CM

## 2023-06-01 DIAGNOSIS — M542 Cervicalgia: Secondary | ICD-10-CM | POA: Diagnosis not present

## 2023-06-01 MED ORDER — CYCLOBENZAPRINE HCL 10 MG PO TABS: 10.0000 mg | ORAL_TABLET | Freq: Every evening | ORAL | 0 refills | Status: DC | PRN
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
## 2023-06-04 ENCOUNTER — Ambulatory Visit (HOSPITAL_BASED_OUTPATIENT_CLINIC_OR_DEPARTMENT_OTHER)
Admission: RE | Admit: 2023-06-04 | Discharge: 2023-06-04 | Disposition: A | Discharge status: Home / Self Care | Payer: No Typology Code available for payment source | Source: Ambulatory Visit | Attending: Family Medicine | Admitting: Family Medicine

## 2023-06-04 DIAGNOSIS — R053 Chronic cough: Secondary | ICD-10-CM | POA: Insufficient documentation

## 2023-06-04 MED ORDER — IOHEXOL 350 MG/ML SOLN
100.0000 mL | Freq: Once | INTRAVENOUS | Status: AC | PRN
  Administered 2023-06-04: 100 mL via INTRAVENOUS

## 2023-06-05 ENCOUNTER — Ambulatory Visit: Payer: No Typology Code available for payment source | Admitting: Student

## 2023-06-05 ENCOUNTER — Encounter: Payer: Self-pay | Admitting: Student

## 2023-06-05 ENCOUNTER — Other Ambulatory Visit: Payer: Self-pay

## 2023-06-05 VITALS — BP 132/80 | HR 64 | Ht 69.0 in | Wt 242.0 lb

## 2023-06-05 DIAGNOSIS — R053 Chronic cough: Secondary | ICD-10-CM

## 2023-06-05 DIAGNOSIS — B079 Viral wart, unspecified: Secondary | ICD-10-CM

## 2023-06-05 NOTE — Progress Notes (Unsigned)
    SUBJECTIVE:   CHIEF COMPLAINT / HPI:   Chronic Cough Present x5-6 weeks now started with viral sick symptoms and one episode of bloody sputum. CT obtained at last visit due to history of PE and pulmonary nodule taken together with his SpO2 of just 92% at last visit. Scan done yesterday was reassuringly neg for recurrent PE or evidence of progression of his pulmonary nodule. He is improving, albeit not as quickly as he would like.   Wart removal Had a wart removed by me several weeks ago. Comes in today for repeat treatment.    OBJECTIVE:   BP 132/80   Pulse 64   Ht 5\' 9"  (1.753 m)   Wt 242 lb (109.8 kg) Comment: pt gave verbal wt  SpO2 99%   BMI 35.74 kg/m   Gen: In good spirits, NAD Cardio: RRR, without murmur Pulm: Normal WOB on RA, lungs are clear to auscultation in all fields Skin: Radial aspect of the R fourth finger with a verruca, previously identified pityriasis rosae rash appears to be resolving  ASSESSMENT/PLAN:   Verruca Diagnosis: Verruca Procedure: Cryotherapy Location: R fourth finger   After discussion of the risks, benefits, and alternative therapies available, the patient elected to proceed. After obtaining written informed consent, the patient's identity, procedure, and site were verified during a time out prior to proceeding procedure. Lesion unroofed using #15 scalpel. The lesion was treated using liquid nitrogen spray gun for 5 second per cycle, 4 cycles total. The patient tolerated the procedure well and there were no immediate complications.  Patient was provided aftercare instructions and advised to return if lesion(s) did not fully resolved.  Chronic cough Improving, though slowly. CTA reassuring in ruling out PE, PNA, or progression of his lung nodule to something more insidious. Given things started out with sick symptoms, I suspect this is post-viral and will continue to improve with time. Considered CHF but he is euvolemic on exam and no evidence  of pulmonary edema on CT. Could consider his Sherryll Burger as a culprit here, but as he is improving, will hold off on making any changes. Expect further improvement with time.      Eliezer Mccoy, MD [facility]

## 2023-06-07 NOTE — Assessment & Plan Note (Signed)
Diagnosis: Verruca Procedure: Cryotherapy Location: R fourth finger   After discussion of the risks, benefits, and alternative therapies available, the patient elected to proceed. After obtaining written informed consent, the patient's identity, procedure, and site were verified during a time out prior to proceeding procedure. Lesion unroofed using #15 scalpel. The lesion was treated using liquid nitrogen spray gun for 5 second per cycle, 4 cycles total. The patient tolerated the procedure well and there were no immediate complications.  Patient was provided aftercare instructions and advised to return if lesion(s) did not fully resolved.

## 2023-06-07 NOTE — Assessment & Plan Note (Signed)
Improving, though slowly. CTA reassuring in ruling out PE, PNA, or progression of his lung nodule to something more insidious. Given things started out with sick symptoms, I suspect this is post-viral and will continue to improve with time. Considered CHF but he is euvolemic on exam and no evidence of pulmonary edema on CT. Could consider his Sherryll Burger as a culprit here, but as he is improving, will hold off on making any changes. Expect further improvement with time.

## 2023-06-10 ENCOUNTER — Ambulatory Visit
Admission: EM | Admit: 2023-06-10 | Discharge: 2023-06-10 | Disposition: A | Discharge status: Home / Self Care | Payer: No Typology Code available for payment source | Attending: Internal Medicine | Admitting: Internal Medicine

## 2023-06-10 DIAGNOSIS — R058 Other specified cough: Secondary | ICD-10-CM | POA: Diagnosis not present

## 2023-06-10 MED ORDER — PREDNISONE 20 MG PO TABS
40.0000 mg | ORAL_TABLET | Freq: Every day | ORAL | 0 refills | Status: AC
Start: 2023-06-10 — End: 2023-06-15

## 2023-06-10 MED ORDER — BENZONATATE 200 MG PO CAPS
200.0000 mg | ORAL_CAPSULE | Freq: Three times a day (TID) | ORAL | 0 refills | Status: DC | PRN
Start: 2023-06-10 — End: 2023-12-27

## 2023-06-10 MED ORDER — ALBUTEROL SULFATE HFA 108 (90 BASE) MCG/ACT IN AERS
1.0000 | INHALATION_SPRAY | Freq: Four times a day (QID) | RESPIRATORY_TRACT | 0 refills | Status: DC | PRN
Start: 2023-06-10 — End: 2024-06-13

## 2023-06-10 NOTE — ED Provider Notes (Signed)
[facility]    CSN: [id number] Arrival date & time: 06/10/23  1427      History   Chief Complaint No chief complaint on file.   HPI Jeffrey Griffin is a 62 y.o. male presents for cough.  Patient reports 2 months of a worsening dry cough with wheezing, shortness of breath, and congestion.  States he feels fatigued.  Reports he had URI symptoms at the beginning of the cough but those have since resolved with the cough remaining.  No fevers.  No asthma or smoking history.  He does have a history of CHF and PE.  He did see his PCP for same complaint on October 1 and had a CT angio chest that was negative for PE or thoracic aortic dissection.  Had a stable 4 cm ascending thoracic aortic aneurysm, and a stable right lower lobe 4 mm nodule and negative for pneumonia.  This was reviewed with his PCP at his visit on October 1 where he noted postviral cough and advised it will improve with time.  Patient denies any lower extremity swelling or orthopnea.  He has not taken any OTC medications for his cough.  No other concerns at this time.  HPI  Past Medical History:  Diagnosis Date   Abnormality of thoracic aorta    4.3 CM ECTATIC ASENDING    Cardiopathy    CHF (congestive heart failure) (HCC)    SYSTOLIC   Epistaxis 03/13/2022   Hyperlipidemia    Hypertension    Lung nodule    Obesity    OSA (obstructive sleep apnea)    Persistent atrial fibrillation (HCC)    WITH RVR   Pleural effusion    RIGHT UPPER LOBE AND RIGHT LOWER LOBE     Patient Active Problem List   Diagnosis Date Noted   Chronic cough 05/30/2023   Hyperlipidemia 05/01/2023   Pityriasis rosea 12/18/2022   Knee pain, bilateral 03/12/2022   Pulmonary embolism (HCC) 03/09/2022   OSA (obstructive sleep apnea) 03/09/2022   PE (pulmonary thromboembolism) (HCC) 03/09/2022   S/P prostatectomy    Paroxysmal atrial fibrillation (HCC)    Fatigue 03/04/2021   Subcutaneous nodule of left foot 03/04/2021   Verruca  03/04/2021   Atypical atrial flutter (HCC) 05/26/2020   Secondary hypercoagulable state (HCC) 04/13/2020   Chronic combined systolic and diastolic CHF (congestive heart failure) (HCC) 04/06/2020   Persistent atrial fibrillation (HCC) 10/01/2019   Pulmonary embolus (HCC) 10/01/2019    Past Surgical History:  Procedure Laterality Date   APPENDECTOMY     ATRIAL FIBRILLATION ABLATION N/A 07/16/2020   Procedure: ATRIAL FIBRILLATION ABLATION;  Surgeon: Hillis Range, MD;  Location: [facility]IVE [facility];  Service: Cardiovascular;  Laterality: N/A;   CARDIOVERSION N/A 10/20/2019   Procedure: CARDIOVERSION;  Surgeon: Elease Hashimoto Deloris Ping, MD;  Location: [facility] ENDOSCOPY;  Service: Cardiovascular;  Laterality: N/A;   CARDIOVERSION N/A 05/31/2020   Procedure: CARDIOVERSION;  Surgeon: Wendall Stade, MD;  Location: [facility] ENDOSCOPY;  Service: Cardiovascular;  Laterality: N/A;   IR ANGIOGRAM PULMONARY BILATERAL SELECTIVE  03/09/2022   IR ANGIOGRAM SELECTIVE EACH ADDITIONAL VESSEL  03/09/2022   IR ANGIOGRAM SELECTIVE EACH ADDITIONAL VESSEL  03/09/2022   IR THROMBECT PRIM MECH INIT (INCLU) MOD SED  03/09/2022   IR US GUIDE VASC ACCESS RIGHT  03/09/2022   PROSTATECTOMY     RADIOLOGY WITH ANESTHESIA N/A 03/09/2022   Procedure: IR WITH ANESTHESIA;  Surgeon: Radiologist, Medication, MD;  Location: [facility] OR;  Service: Radiology;  Laterality: N/A;  VASECTOMY         Home Medications    Prior to Admission medications   Medication Sig Start Date End Date Taking? Authorizing Provider  albuterol (VENTOLIN HFA) 108 (90 Base) MCG/ACT inhaler Inhale 1-2 puffs into the lungs every 6 (six) hours as needed for wheezing or shortness of breath. 06/10/23  Yes Radford Pax, NP  benzonatate (TESSALON) 200 MG capsule Take 1 capsule (200 mg total) by mouth 3 (three) times daily as needed. 06/10/23  Yes Radford Pax, NP  predniSONE (DELTASONE) 20 MG tablet Take 2 tablets (40 mg total) by mouth daily with breakfast for 5 days.  06/10/23 06/15/23 Yes Radford Pax, NP  apixaban (ELIQUIS) 5 MG TABS tablet Take 1 tablet (5 mg total) by mouth 2 (two) times daily. 03/26/23   Camnitz, Will Daphine Deutscher, MD  atorvastatin (LIPITOR) 40 MG tablet TAKE 1 TABLET BY MOUTH EVERY DAY 12/29/22   Nahser, Deloris Ping, MD  Carnitine-B5-B6 500-15-5 MG TABS Take 1 tablet by mouth daily.    [provider]  Cholecalciferol (VITAMIN D3) 25 MCG (1000 UT) CAPS Take 1,000 Units by mouth daily.    [provider]  Coenzyme Q10 300 MG CAPS Take 1 capsule by mouth daily.    [provider]  Cyanocobalamin (B-12) 5000 MCG CAPS Take 1 capsule by mouth daily.    [provider]  cyclobenzaprine (FLEXERIL) 10 MG tablet Take 1 tablet (10 mg total) by mouth at bedtime as needed for muscle spasms. 06/01/23   Marisa Cyphers, MD  diclofenac Sodium (VOLTAREN) 1 % GEL Apply 4 g topically 4 (four) times daily. 01/28/21   Lilland, Alana, DO  Garlic 1000 MG CAPS Take 1 capsule by mouth daily.    [provider]  Glucosamine-Chondroitin (COSAMIN DS PO) Take 2 tablets by mouth daily.    [provider]  Hawthorn 150 MG CAPS Take 1 capsule by mouth daily at 6 (six) AM.    [provider]  metoprolol succinate (TOPROL-XL) 50 MG 24 hr tablet TAKE 1 TABLET BY MOUTH EVERY DAY WITH OR IMMEDIATELY FOLLOWING A MEAL 12/29/22   Nahser, Deloris Ping, MD  metoprolol tartrate (LOPRESSOR) 25 MG tablet Take 1 tablet (25 mg total) by mouth every 6 (six) hours as needed (up to 2 doses daily for breakthrough tachycardia). 05/01/23   Perlie Gold, PA-C  Multiple Minerals-Vitamins (CAL MAG ZINC +D3 PO) Take 1 tablet by mouth daily.    [provider]  Multiple Vitamin (MULTIVITAMIN WITH MINERALS) TABS tablet Take 1 tablet by mouth daily.    [provider]  Omega-3 1400 MG CAPS Take 1 capsule by mouth daily.    [provider]  sacubitril-valsartan (ENTRESTO) 97-103 MG Take 1 tablet by mouth 2 (two) times daily.  05/01/23   Perlie Gold, PA-C  saline (AYR) GEL Place 1 Application into the nose every 4 (four) hours as needed (For nasal dryness). 03/14/22   Valetta Close, MD  Taurine 1000 MG CAPS Take 1 capsule by mouth daily.    [provider]  triamcinolone ointment (KENALOG) 0.5 % Apply 1 Application topically 2 (two) times daily. 12/18/22   Valetta Close, MD  vitamin E 180 MG (400 UNITS) capsule Take 400 Units by mouth daily.    [provider]    Family History Family History  Problem Relation Age of Onset   Hypertrophic cardiomyopathy Mother    Stroke Father    Hypertension Father    Hypertension  Sister    Atrial fibrillation Brother     Social History Social History   Tobacco Use   Smoking status: Never   Smokeless tobacco: Never  Substance Use Topics   Alcohol use: Not Currently   Drug use: No     Allergies   Patient has no known allergies.   Review of Systems Review of Systems  Constitutional:  Positive for fatigue.  Respiratory:  Positive for cough, shortness of breath and wheezing.      Physical Exam Triage Vital Signs ED Triage Vitals  Encounter Vitals Group     BP 06/10/23 1539 (!) 148/96     Systolic BP Percentile --      Diastolic BP Percentile --      Pulse Rate 06/10/23 1539 62     Resp 06/10/23 1539 18     Temp 06/10/23 1539 98 F (36.7 C)     Temp Source 06/10/23 1539 Oral     SpO2 06/10/23 1539 94 %     Weight --      Height --      Head Circumference --      Peak Flow --      Pain Score 06/10/23 1545 0     Pain Loc --      Pain Education --      Exclude from Growth Chart --    No data found.  Updated Vital Signs BP (!) 148/96 (BP Location: Right Arm)   Pulse 62   Temp 98 F (36.7 C) (Oral)   Resp 18   SpO2 94%   Visual Acuity Right Eye Distance:   Left Eye Distance:   Bilateral Distance:    Right Eye Near:   Left Eye Near:    Bilateral Near:     Physical Exam Vitals and nursing note reviewed.   Constitutional:      General: He is not in acute distress.    Appearance: Normal appearance. He is not ill-appearing or toxic-appearing.  HENT:     Head: Normocephalic and atraumatic.     Right Ear: Tympanic membrane and ear canal normal.     Left Ear: Tympanic membrane and ear canal normal.     Nose: No congestion.     Mouth/Throat:     Mouth: Mucous membranes are moist.     Pharynx: No posterior oropharyngeal erythema.  Eyes:     Pupils: Pupils are equal, round, and reactive to light.  Cardiovascular:     Rate and Rhythm: Normal rate and regular rhythm.     Heart sounds: Normal heart sounds.  Pulmonary:     Effort: Pulmonary effort is normal.     Breath sounds: Normal breath sounds. No wheezing.  Musculoskeletal:     Cervical back: Normal range of motion and neck supple.  Lymphadenopathy:     Cervical: No cervical adenopathy.  Skin:    General: Skin is warm and dry.  Neurological:     General: No focal deficit present.     Mental Status: He is alert and oriented to person, place, and time.  Psychiatric:        Mood and Affect: Mood normal.        Behavior: Behavior normal.      UC Treatments / Results  Labs (all labs ordered are listed, but only abnormal results are displayed) Labs Reviewed - No data to display  EKG   Radiology No results found.  Procedures Procedures (including critical care time)  Medications Ordered  in UC Medications - No data to display  Initial Impression / Assessment and Plan / UC Course  I have reviewed the triage vital signs and the nursing notes.  Pertinent labs & imaging results that were available during my care of the patient were reviewed by me and considered in my medical decision making (see chart for details).     Reviewed exam and symptoms with patient.  No red flags.  Vital signs stable and he is well-appearing and in no acute distress.  Recent CT angio chest without PE or pneumonia.  Discussed with patient postviral  cough syndrome.  Albuterol inhaler as he reports shortness of breath and wheezing.  Tessalon as needed for cough.  Short course of prednisone.  Advised continued rest and follow-up with PCP if symptoms do not improve.  ER precautions reviewed and patient verbalized understanding. Final Clinical Impressions(s) / UC Diagnoses   Final diagnoses:  Post-viral cough syndrome     Discharge Instructions      Start Tessalon as needed for cough.  You may use albuterol inhaler as needed for shortness of breath.  Start prednisone daily for 5 days.  Lots of rest and fluids.  Please follow-up with your PCP if your symptoms do not improve.  Please go to the ER for any worsening symptoms.  I hope you feel better soon!    ED Prescriptions     Medication Sig Dispense Auth. Provider   albuterol (VENTOLIN HFA) 108 (90 Base) MCG/ACT inhaler Inhale 1-2 puffs into the lungs every 6 (six) hours as needed for wheezing or shortness of breath. 1 each Radford Pax, NP   benzonatate (TESSALON) 200 MG capsule Take 1 capsule (200 mg total) by mouth 3 (three) times daily as needed. 20 capsule Radford Pax, NP   predniSONE (DELTASONE) 20 MG tablet Take 2 tablets (40 mg total) by mouth daily with breakfast for 5 days. 10 tablet Radford Pax, NP      PDMP not reviewed this encounter.   Radford Pax, NP 06/10/23 [phone]

## 2023-06-10 NOTE — ED Triage Notes (Signed)
Pt presents to UC w/ c/o cough x8 weeks. Pt feels that his cough is "getting worse, is more congested, has been wheezing, and it has affected his sleep." No pcp currently.

## 2023-06-10 NOTE — Discharge Instructions (Addendum)
Start Tessalon as needed for cough.  You may use albuterol inhaler as needed for shortness of breath.  Start prednisone daily for 5 days.  Lots of rest and fluids.  Please follow-up with your PCP if your symptoms do not improve.  Please go to the ER for any worsening symptoms.  I hope you feel better soon!

## 2023-06-11 ENCOUNTER — Other Ambulatory Visit: Payer: Self-pay

## 2023-06-11 DIAGNOSIS — I2699 Other pulmonary embolism without acute cor pulmonale: Secondary | ICD-10-CM

## 2023-06-12 ENCOUNTER — Inpatient Hospital Stay: Payer: No Typology Code available for payment source | Attending: Hematology and Oncology

## 2023-06-12 DIAGNOSIS — Z9079 Acquired absence of other genital organ(s): Secondary | ICD-10-CM

## 2023-06-12 DIAGNOSIS — Z7901 Long term (current) use of anticoagulants: Secondary | ICD-10-CM | POA: Diagnosis not present

## 2023-06-12 DIAGNOSIS — Z86711 Personal history of pulmonary embolism: Secondary | ICD-10-CM | POA: Diagnosis present

## 2023-06-12 DIAGNOSIS — I2699 Other pulmonary embolism without acute cor pulmonale: Secondary | ICD-10-CM

## 2023-06-12 DIAGNOSIS — Z86718 Personal history of other venous thrombosis and embolism: Secondary | ICD-10-CM | POA: Insufficient documentation

## 2023-06-12 DIAGNOSIS — Z79899 Other long term (current) drug therapy: Secondary | ICD-10-CM | POA: Insufficient documentation

## 2023-06-12 DIAGNOSIS — C61 Malignant neoplasm of prostate: Secondary | ICD-10-CM | POA: Diagnosis not present

## 2023-06-12 LAB — CBC WITH DIFFERENTIAL (CANCER CENTER ONLY)
Abs Immature Granulocytes: 0.02 10*3/uL (ref 0.00–0.07)
Basophils Absolute: 0.1 10*3/uL (ref 0.0–0.1)
Basophils Relative: 1 %
Eosinophils Absolute: 0.2 10*3/uL (ref 0.0–0.5)
Eosinophils Relative: 2 %
HCT: 48.5 % (ref 39.0–52.0)
Hemoglobin: 16 g/dL (ref 13.0–17.0)
Immature Granulocytes: 0 %
Lymphocytes Relative: 23 %
Lymphs Abs: 2 10*3/uL (ref 0.7–4.0)
MCH: 30 pg (ref 26.0–34.0)
MCHC: 33 g/dL (ref 30.0–36.0)
MCV: 91 fL (ref 80.0–100.0)
Monocytes Absolute: 1 10*3/uL (ref 0.1–1.0)
Monocytes Relative: 11 %
Neutro Abs: 5.3 10*3/uL (ref 1.7–7.7)
Neutrophils Relative %: 63 %
Platelet Count: 237 10*3/uL (ref 150–400)
RBC: 5.33 MIL/uL (ref 4.22–5.81)
RDW: 12.4 % (ref 11.5–15.5)
WBC Count: 8.5 10*3/uL (ref 4.0–10.5)
nRBC: 0 % (ref 0.0–0.2)

## 2023-06-12 LAB — CMP (CANCER CENTER ONLY)
ALT: 18 U/L (ref 0–44)
AST: 17 U/L (ref 15–41)
Albumin: 4.3 g/dL (ref 3.5–5.0)
Alkaline Phosphatase: 77 U/L (ref 38–126)
Anion gap: 8 (ref 5–15)
BUN: 19 mg/dL (ref 6–20)
CO2: 26 mmol/L (ref 22–32)
Calcium: 10.6 mg/dL — ABNORMAL HIGH (ref 8.9–10.3)
Chloride: 106 mmol/L (ref 98–111)
Creatinine: 1.11 mg/dL (ref 0.61–1.24)
GFR, Estimated: >60 mL/min (ref >60)
Glucose, Bld: 99 mg/dL (ref 70–99)
Potassium: 3.7 mmol/L (ref 3.5–5.1)
Sodium: 140 mmol/L (ref 135–145)
Total Bilirubin: 0.7 mg/dL (ref 0.3–1.2)
Total Protein: 7.3 g/dL (ref 6.5–8.1)

## 2023-06-13 LAB — PROSTATE-SPECIFIC AG, SERUM (LABCORP): Prostate Specific Ag, Serum: 0.9 ng/mL (ref 0.0–4.0)

## 2023-06-15 ENCOUNTER — Ambulatory Visit: Payer: No Typology Code available for payment source | Admitting: Podiatry

## 2023-06-15 ENCOUNTER — Encounter: Payer: Self-pay | Admitting: Podiatry

## 2023-06-15 DIAGNOSIS — L600 Ingrowing nail: Secondary | ICD-10-CM | POA: Diagnosis not present

## 2023-06-15 NOTE — Patient Instructions (Signed)

## 2023-06-18 NOTE — Progress Notes (Signed)
Subjective:   Patient ID: Jeffrey Griffin, male   DOB: 62 y.o.   MRN: [id number]   HPI Patient presents stating that he needs his left big toenail removed as it has been very sore and making it hard to wear shoe gear comfortably with other nails doing well   ROS      Objective:  Physical Exam  Neuro vascular status intact with patient's left hallux nail found to be thickened and dystrophic and moderately painful when pressed from a dorsal direction     Assessment:  Chronic nail disease with thickness of the left big toenail with other nails that have been done and painful and have done well     Plan:  Reviewed condition recommended removal and explained procedure to the patient patient read and then signed consent form understanding risk and today I went ahead and I infiltrated the left hallux 60 mg like Marcaine mixture sterile prep done using sterile instrumentation removed the hallux nail exposed matrix applied phenol for applications 30 seconds followed by alcohol lavage sterile dressing gave instructions on soaks and wear dressings 24 hours and take off earlier if throbbing were to occur.  This was a permanent procedure and was tolerated well

## 2023-06-19 ENCOUNTER — Other Ambulatory Visit: Payer: Self-pay

## 2023-06-19 ENCOUNTER — Ambulatory Visit: Payer: No Typology Code available for payment source | Attending: Sports Medicine

## 2023-06-19 DIAGNOSIS — M542 Cervicalgia: Secondary | ICD-10-CM | POA: Diagnosis present

## 2023-06-19 DIAGNOSIS — M546 Pain in thoracic spine: Secondary | ICD-10-CM | POA: Diagnosis present

## 2023-06-19 DIAGNOSIS — M545 Low back pain, unspecified: Secondary | ICD-10-CM | POA: Insufficient documentation
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
## 2023-06-20 ENCOUNTER — Inpatient Hospital Stay (HOSPITAL_BASED_OUTPATIENT_CLINIC_OR_DEPARTMENT_OTHER): Payer: No Typology Code available for payment source | Admitting: Hematology and Oncology

## 2023-06-20 ENCOUNTER — Other Ambulatory Visit: Payer: Self-pay | Admitting: Hematology and Oncology

## 2023-06-20 VITALS — BP 134/71 | HR 80 | Temp 98.1°F | Resp 16 | Wt 241.0 lb

## 2023-06-20 DIAGNOSIS — I2699 Other pulmonary embolism without acute cor pulmonale: Secondary | ICD-10-CM | POA: Diagnosis not present

## 2023-06-20 DIAGNOSIS — Z86711 Personal history of pulmonary embolism: Secondary | ICD-10-CM | POA: Diagnosis not present

## 2023-06-20 NOTE — Progress Notes (Signed)
[facility] [phone]   [phone]  PROGRESS NOTE  Patient Care Team: Nelia Shi, MD as PCP - General (Family Medicine) Nahser, Deloris Ping, MD as PCP - Cardiology (Cardiology) Regan Lemming, MD as PCP - Electrophysiology (Cardiology)  Hematological/Oncological History # Recurrent DVT and Pulmonary Embolism 09/15/2019: CTA showed acute subsegmental pulmonary embolism to the right upper and right lower lobes. Started anticoagulation therapy with Eliquis. 03/08/2022-03/14/2022: Admitted after presenting with shortness of breath and back pain after being off Eliquis for 2 weeks due to prostatectomy. CTA chest reveled extensive bilateral pulmonary emboli with saddle pulmonary embolus with presence of right heart strain. Bilateral lower extremity ultrasound significant for DVT involving posterior tibial veins bilaterally. Echo significant for EF 55-60%, moderate left ventricular hypertrophy, aortic dilatation with dilatation of aortic root 42 mm. Dilatation of ascending aorta measuring 41mm. CCM and interventional radiology consulted, patient had thrombectomy on 7/6. Patient was treated with heparin and then transitioned to home Eliquis. 10/10/2022: Establish care with [facility]  Interval History:  Jeffrey Griffin 62 y.o. male with medical history significant for recurrent DVT and PE who presents for a follow up visit. The patient's last visit was on 10/10/2022 with [name]. In the interim since the last visit he reports he has had some shortness of breath and issues with low oxygen.  He underwent a CT PE study last month which was negative.  He is currently in between PCPs and looking for a DO as he prefers DO care.  He reports that he is looking in [place] in [place].  He has had no other major changes in his health such as emergency rooms visits, hospitalizations, or new medications.  He reports that he is tolerating Eliquis well with no bleeding,  bruising, or dark stools.  He reports that he is following with urology for his history of prostate cancer.  He notes that even with a needle stuck him today he bled for only about 2 seconds.  He reports that because the Eliquis is very reasonable, paying about $10 for 90-day supply.  He reports that otherwise he is had no fevers, chills, sweats, nausea, vomiting or diarrhea.  He has no signs or symptoms concerning for recurrent VTE.  A full 10 point ROS is otherwise negative.  MEDICAL HISTORY:  Past Medical History:  Diagnosis Date   Abnormality of thoracic aorta    4.3 CM ECTATIC ASENDING    Cardiopathy    CHF (congestive heart failure) (HCC)    SYSTOLIC   Epistaxis 03/13/2022   Hyperlipidemia    Hypertension    Lung nodule    Obesity    OSA (obstructive sleep apnea)    Persistent atrial fibrillation (HCC)    WITH RVR   Pleural effusion    RIGHT UPPER LOBE AND RIGHT LOWER LOBE     SURGICAL HISTORY: Past Surgical History:  Procedure Laterality Date   APPENDECTOMY     ATRIAL FIBRILLATION ABLATION N/A 07/16/2020   Procedure: ATRIAL FIBRILLATION ABLATION;  Surgeon: Hillis Range, MD;  Location: [facility] INVASIVE CV LAB;  Service: Cardiovascular;  Laterality: N/A;   CARDIOVERSION N/A 10/20/2019   Procedure: CARDIOVERSION;  Surgeon: Elease Hashimoto Deloris Ping, MD;  Location: [facility] ENDOSCOPY;  Service: Cardiovascular;  Laterality: N/A;   CARDIOVERSION N/A 05/31/2020   Procedure: CARDIOVERSION;  Surgeon: Wendall Stade, MD;  Location[facility] ENDOSCOPY;  Service: Cardiovascular;  Laterality: N/A;   IR ANGIOGRAM PULMONARY BILATERAL SELECTIVE  03/09/2022   IR ANGIOGRAM SELECTIVE EACH ADDITIONAL VESSEL  03/09/2022  IR ANGIOGRAM SELECTIVE EACH ADDITIONAL VESSEL  03/09/2022   IR THROMBECT PRIM MECH INIT (INCLU) MOD SED  03/09/2022   IR US GUIDE VASC ACCESS RIGHT  03/09/2022   PROSTATECTOMY     RADIOLOGY WITH ANESTHESIA N/A 03/09/2022   Procedure: IR WITH ANESTHESIA;  Surgeon: Radiologist, Medication, MD;   Location: [facility] OR;  Service: Radiology;  Laterality: N/A;   VASECTOMY      SOCIAL HISTORY: Social History   Socioeconomic History   Marital status: Married    Spouse name: Not on file   Number of children: Not on file   Years of education: Not on file   Highest education level: Not on file  Occupational History   Not on file  Tobacco Use   Smoking status: Never   Smokeless tobacco: Never  Substance and Sexual Activity   Alcohol use: Not Currently   Drug use: No   Sexual activity: Not on file  Other Topics Concern   Not on file  Social History Narrative   Lives in [place].   Social Determinants of Health   Financial Resource Strain: Patient Declined (01/15/2023)   Overall Financial Resource Strain (CARDIA)    Difficulty of Paying Living Expenses: Patient declined  Food Insecurity: Patient Declined (01/15/2023)   Hunger Vital Sign    Worried About Running Out of Food in the Last Year: Patient declined    Ran Out of Food in the Last Year: Patient declined  Transportation Needs: Patient Declined (01/15/2023)   PRAPARE - Administrator, Civil Service (Medical): Patient declined    Lack of Transportation (Non-Medical): Patient declined  Physical Activity: Unknown (01/15/2023)   Exercise Vital Sign    Days of Exercise per Week: Patient declined    Minutes of Exercise per Session: Not on file  Stress: Patient Declined (01/15/2023)   [employer] of Occupational Health - Occupational Stress Questionnaire    Feeling of Stress : Patient declined  Social Connections: Unknown (01/15/2023)   Social Connection and Isolation Panel [NHANES]    Frequency of Communication with Friends and Family: Patient declined    Frequency of Social Gatherings with Friends and Family: Patient declined    Attends Religious Services: Patient declined    Database administrator or Organizations: Patient declined    Attends Banker Meetings: Not on file    Marital  Status: Patient declined  Intimate Partner Violence: Not At Risk (10/10/2022)   Humiliation, Afraid, Rape, and Kick questionnaire    Fear of Current or Ex-Partner: No    Emotionally Abused: No    Physically Abused: No    Sexually Abused: No    FAMILY HISTORY: Family History  Problem Relation Age of Onset   Hypertrophic cardiomyopathy Mother    Stroke Father    Hypertension Father    Hypertension Sister    Atrial fibrillation Brother     ALLERGIES:  has No Known Allergies.  MEDICATIONS:  Current Outpatient Medications  Medication Sig Dispense Refill   albuterol (VENTOLIN HFA) 108 (90 Base) MCG/ACT inhaler Inhale 1-2 puffs into the lungs every 6 (six) hours as needed for wheezing or shortness of breath. 1 each 0   apixaban (ELIQUIS) 5 MG TABS tablet Take 1 tablet (5 mg total) by mouth 2 (two) times daily. 180 tablet 2   atorvastatin (LIPITOR) 40 MG tablet TAKE 1 TABLET BY MOUTH EVERY DAY 90 tablet 3   benzonatate (TESSALON) 200 MG capsule Take 1 capsule (200 mg total) by  mouth 3 (three) times daily as needed. 20 capsule 0   Carnitine-B5-B6 500-15-5 MG TABS Take 1 tablet by mouth daily.     Cholecalciferol (VITAMIN D3) 25 MCG (1000 UT) CAPS Take 1,000 Units by mouth daily.     Coenzyme Q10 300 MG CAPS Take 1 capsule by mouth daily.     Cyanocobalamin (B-12) 5000 MCG CAPS Take 1 capsule by mouth daily.     cyclobenzaprine (FLEXERIL) 10 MG tablet Take 1 tablet (10 mg total) by mouth at bedtime as needed for muscle spasms. 30 tablet 0   diclofenac Sodium (VOLTAREN) 1 % GEL Apply 4 g topically 4 (four) times daily. 350 g 0   Garlic 1000 MG CAPS Take 1 capsule by mouth daily.     Glucosamine-Chondroitin (COSAMIN DS PO) Take 2 tablets by mouth daily.     Hawthorn 150 MG CAPS Take 1 capsule by mouth daily at 6 (six) AM.     metoprolol succinate (TOPROL-XL) 50 MG 24 hr tablet TAKE 1 TABLET BY MOUTH EVERY DAY WITH OR IMMEDIATELY FOLLOWING A MEAL 90 tablet 3   metoprolol tartrate (LOPRESSOR)  25 MG tablet Take 1 tablet (25 mg total) by mouth every 6 (six) hours as needed (up to 2 doses daily for breakthrough tachycardia). 30 tablet 11   Multiple Minerals-Vitamins (CAL MAG ZINC +D3 PO) Take 1 tablet by mouth daily.     Multiple Vitamin (MULTIVITAMIN WITH MINERALS) TABS tablet Take 1 tablet by mouth daily.     Omega-3 1400 MG CAPS Take 1 capsule by mouth daily.     sacubitril-valsartan (ENTRESTO) 97-103 MG Take 1 tablet by mouth 2 (two) times daily. 180 tablet 3   saline (AYR) GEL Place 1 Application into the nose every 4 (four) hours as needed (For nasal dryness).  0   Taurine 1000 MG CAPS Take 1 capsule by mouth daily.     triamcinolone ointment (KENALOG) 0.5 % Apply 1 Application topically 2 (two) times daily. 30 g 1   vitamin E 180 MG (400 UNITS) capsule Take 400 Units by mouth daily.     No current facility-administered medications for this visit.    REVIEW OF SYSTEMS:   Constitutional: ( - ) fevers, ( - )  chills , ( - ) night sweats Eyes: ( - ) blurriness of vision, ( - ) double vision, ( - ) watery eyes Ears, nose, mouth, throat, and face: ( - ) mucositis, ( - ) sore throat Respiratory: ( - ) cough, ( - ) dyspnea, ( - ) wheezes Cardiovascular: ( - ) palpitation, ( - ) chest discomfort, ( - ) lower extremity swelling Gastrointestinal:  ( - ) nausea, ( - ) heartburn, ( - ) change in bowel habits Skin: ( - ) abnormal skin rashes Lymphatics: ( - ) new lymphadenopathy, ( - ) easy bruising Neurological: ( - ) numbness, ( - ) tingling, ( - ) new weaknesses Behavioral/Psych: ( - ) mood change, ( - ) new changes  All other systems were reviewed with the patient and are negative.  PHYSICAL EXAMINATION: Vitals:   06/20/23 0928  BP: 134/71  Pulse: 80  Resp: 16  Temp: 98.1 F (36.7 C)  SpO2: 93%   Filed Weights   06/20/23 0928  Weight: 241 lb (109.3 kg)    GENERAL: Well-appearing middle-age Caucasian male, alert, no distress and comfortable SKIN: skin color, texture,  turgor are normal, no rashes or significant lesions EYES: conjunctiva are pink and non-injected, sclera clear LUNGS:  clear to auscultation and percussion with normal breathing effort HEART: regular rate & rhythm and no murmurs and no lower extremity edema Musculoskeletal: no cyanosis of digits and no clubbing  PSYCH: alert & oriented x 3, fluent speech NEURO: no focal motor/sensory deficits  LABORATORY DATA:  I have reviewed the data as listed    Latest Ref Rng & Units 06/12/2023    7:26 AM 10/10/2022   12:27 PM 03/14/2022    6:51 AM  CBC  WBC 4.0 - 10.5 K/uL 8.5  5.6  9.5   Hemoglobin 13.0 - 17.0 g/dL 59.5  63.8  75.6   Hematocrit 39.0 - 52.0 % 48.5  46.5  32.9   Platelets 150 - 400 K/uL 237  245  474        Latest Ref Rng & Units 06/12/2023    7:26 AM 10/10/2022   12:27 PM 03/12/2022   12:47 AM  CMP  Glucose 70 - 99 mg/dL 99  98  433   BUN 6 - 20 mg/dL 19  14  16    Creatinine 0.61 - 1.24 mg/dL 2.95  1.88  4.16   Sodium 135 - 145 mmol/L 140  140  134   Potassium 3.5 - 5.1 mmol/L 3.7  4.5  4.0   Chloride 98 - 111 mmol/L 106  106  95   CO2 22 - 32 mmol/L 26  29  29    Calcium 8.9 - 10.3 mg/dL 60.6  30.1  9.3   Total Protein 6.5 - 8.1 g/dL 7.3  6.7    Total Bilirubin 0.3 - 1.2 mg/dL 0.7  0.7    Alkaline Phos 38 - 126 U/L 77  84    AST 15 - 41 U/L 17  23    ALT 0 - 44 U/L 18  24     RADIOGRAPHIC STUDIES: CT Angio Chest W/Cm &/Or Wo Cm  Result Date: 06/04/2023 CLINICAL DATA:  Chronic cough, history of PE and nodule EXAM: CT ANGIOGRAPHY CHEST WITH CONTRAST TECHNIQUE: Multidetector CT imaging of the chest was performed using the standard protocol during bolus administration of intravenous contrast. Multiplanar CT image reconstructions and MIPs were obtained to evaluate the vascular anatomy. RADIATION DOSE REDUCTION: This exam was performed according to the departmental dose-optimization program which includes automated exposure control, adjustment of the mA and/or kV according to patient  size and/or use of iterative reconstruction technique. CONTRAST:  OMNIPAQUE IOHEXOL 350 MG/ML SOLN COMPARISON:  03/12/2022 and previous FINDINGS: Cardiovascular: Heart size normal. No pericardial effusion. Satisfactory opacification of pulmonary arteries noted, and there is no evidence of pulmonary emboli. Scattered LAD coronary calcifications. 4 cm diameter ascending thoracic aorta as before. Adequate contrast opacification of the thoracic aorta with no evidence of dissection, or stenosis. There is classic 3-vessel brachiocephalic arch anatomy without proximal stenosis. Mediastinum/Nodes: No mass, hematoma, or adenopathy. Lungs/Pleura: No pleural effusion. No pneumothorax. Minimal dependent atelectasis in both lower lobes. 4 mm subpleural nodule, lateral right lower lobe (Im69,Se6) stable since 04/20/2020. Lungs otherwise clear. Upper Abdomen: No acute findings. Musculoskeletal: No chest wall abnormality. No acute or significant osseous findings. Review of the MIP images confirms the above findings. IMPRESSION: 1. Negative for acute PE or thoracic aortic dissection. 2. 4 cm diameter ascending thoracic aorta, stable. Recommend annual imaging followup by CTA or MRA. This recommendation follows 2010 ACCF/AHA/AATS/ACR/ASA/SCA/SCAI/SIR/STS/SVM Guidelines for the Diagnosis and Management of Patients with Thoracic Aortic Disease. Circulation. 2010; 121: [id number] 3. 4 mm right lower lobe nodule, stable since 04/20/2020. Electronically Signed  By: Corlis Leak M.D.   On: 06/04/2023 08:16    ASSESSMENT & PLAN Jeffrey Griffin is a 62 y.o. male who presents to the hematology clinic for follow up of recurrent venous thromboembolism.  We reviewed patient's history in detail do not feel that he failed Eliquis therapy since secondary to interruption of anticoagulation therapy and undergoing surgery.  Patient has no family history of clotting disorders.   #Recurrent PE/DVT: --Recommend indefinite anticoagulation due to  recurrent thrombotic episodes --Currently on Eliquis 5 mg PO twice daily. Due to large clot burden with most recent episode, recommend to continue on full strength dose. Consider maintenance dose of 2.5 mg PO twice daily if risk of bleeding is a concern in the future.  --Labs today show white blood cell 8.5, hemoglobin 16.0, MCV 91, platelets 237 --RTC in 6 months with labs unless above workup requires more intervention   #Prostate cancer: --Underwent RARP plus bilateral pelvic lymph node dissections on 03/01/22 at [facility]. Pathology shows a [id number] Margins Negative Gleason 4+5 = 9 . --Now under the care of [facility], currently under surveillance.   No orders of the defined types were placed in this encounter.   All questions were answered. The patient knows to call the clinic with any problems, questions or concerns.  A total of more than 30 minutes were spent on this encounter with face-to-face time and non-face-to-face time, including preparing to see the patient, ordering tests and/or medications, counseling the patient and coordination of care as outlined above.   Ulysees Barns, MD Department of Hematology/Oncology [facility] Phone: [phone] Pager: [phone] Email: [email or web address]  06/20/2023 9:35 AM

## 2023-06-21 ENCOUNTER — Ambulatory Visit: Payer: No Typology Code available for payment source | Admitting: Physical Therapy

## 2023-06-28 ENCOUNTER — Other Ambulatory Visit: Payer: Self-pay | Admitting: Physical Medicine and Rehabilitation

## 2023-06-28 DIAGNOSIS — K219 Gastro-esophageal reflux disease without esophagitis: Secondary | ICD-10-CM

## 2023-07-02 ENCOUNTER — Ambulatory Visit: Payer: No Typology Code available for payment source | Admitting: Physical Therapy

## 2023-07-02 ENCOUNTER — Encounter: Payer: Self-pay | Admitting: Physical Therapy

## 2023-07-02 DIAGNOSIS — M546 Pain in thoracic spine: Secondary | ICD-10-CM

## 2023-07-02 DIAGNOSIS — M542 Cervicalgia: Secondary | ICD-10-CM
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
## 2023-07-04 ENCOUNTER — Ambulatory Visit: Payer: No Typology Code available for payment source

## 2023-07-04 DIAGNOSIS — M546 Pain in thoracic spine: Secondary | ICD-10-CM

## 2023-07-04 DIAGNOSIS — M542 Cervicalgia: Secondary | ICD-10-CM

## 2023-07-04 NOTE — Patient Instructions (Signed)
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
## 2023-07-10 ENCOUNTER — Ambulatory Visit: Payer: Self-pay | Attending: Sports Medicine

## 2023-07-10 DIAGNOSIS — G8929 Other chronic pain: Secondary | ICD-10-CM | POA: Insufficient documentation

## 2023-07-10 DIAGNOSIS — M25521 Pain in right elbow: Secondary | ICD-10-CM | POA: Insufficient documentation

## 2023-07-10 DIAGNOSIS — M542 Cervicalgia: Secondary | ICD-10-CM | POA: Insufficient documentation

## 2023-07-10 DIAGNOSIS — M25512 Pain in left shoulder: Secondary | ICD-10-CM | POA: Insufficient documentation

## 2023-07-10 DIAGNOSIS — M546 Pain in thoracic spine: Secondary | ICD-10-CM | POA: Insufficient documentation
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
## 2023-07-11 NOTE — Progress Notes (Unsigned)
  Electrophysiology Office Note:   Date:  07/12/2023  ID:  Jeffrey Griffin, DOB 11-Mar-1961, MRN [id number]  Primary Cardiologist: Kristeen Miss, MD Electrophysiologist: Regan Lemming, MD      History of Present Illness:   Jeffrey Griffin is a 62 y.o. male with h/o AF s/p ablation, HTN, OSA, HFrecEF, and thoracic aortic aneurysm seen today for routine electrophysiology followup.   Since last being seen in our clinic the patient reports doing well from a cardiac perspective. Since August, he has had globus sensation and intermittent trouble breathing. Has had productive cough at time and "upper airway wheezing". Nasal endoscopy unremarkable. Pending Barium swallow, has not had EGD or PFTs. Otherwise, he denies chest pain, palpitations, PND, orthopnea, nausea, vomiting, dizziness, syncope, edema, weight gain, or early satiety.   Review of systems complete and found to be negative unless listed in HPI.   EP Information / Studies Reviewed:    EKG is not ordered today. EKG from 05/01/2023 reviewed which showed NSR at 69 bpm       Arrhythmia History  S/p ablation 07/2020  Echo 03/10/2022 LVEF 55-60%, Mod LVH  Physical Exam:   VS:  BP 136/80   Pulse 69   Ht 5\' 9"  (1.753 m)   Wt 244 lb (110.7 kg)   SpO2 96%   BMI 36.03 kg/m    Wt Readings from Last 3 Encounters:  07/12/23 244 lb (110.7 kg)  06/20/23 241 lb (109.3 kg)  06/05/23 242 lb (109.8 kg)     GEN: Well nourished, well developed in no acute distress NECK: No JVD; No carotid bruits CARDIAC: Regular rate and rhythm, no murmurs, rubs, gallops RESPIRATORY:  Clear to auscultation without rales, wheezing or rhonchi  ABDOMEN: Soft, non-tender, non-distended EXTREMITIES:  No edema; No deformity   ASSESSMENT AND PLAN:    Paroxysmal AF S/p ablation 07/2020 Doing well since from AF perspective Remains on Eliquis 5 mg BID for CHA2DS2VASc  of at least 3 Continue toprol 50 mg daily  OSA  Encouraged nightly CPAP   HTN Stable on  current regimen   HFrecEF Continue GDMT  Dyspnea Chronic cough "GI" based work up has been unremarkable so far Normal EF last year, CTA PE negative.  Using CPAP daily Will refer to Pulmonology to consider PFTs or other recommendations. Can cancel if other further work up results positive   Follow up with Dr. Elberta Fortis in 6 months  Signed, Graciella Freer, PA-C
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
## 2023-07-12 ENCOUNTER — Ambulatory Visit: Payer: Self-pay

## 2023-07-12 ENCOUNTER — Encounter: Payer: Self-pay | Admitting: Student

## 2023-07-12 ENCOUNTER — Ambulatory Visit
Admission: RE | Admit: 2023-07-12 | Discharge: 2023-07-12 | Disposition: A | Discharge status: Home / Self Care | Payer: No Typology Code available for payment source | Source: Ambulatory Visit | Attending: Physical Medicine and Rehabilitation | Admitting: Physical Medicine and Rehabilitation

## 2023-07-12 ENCOUNTER — Other Ambulatory Visit: Payer: Self-pay | Admitting: Physical Medicine and Rehabilitation

## 2023-07-12 ENCOUNTER — Ambulatory Visit: Payer: No Typology Code available for payment source | Attending: Student | Admitting: Student

## 2023-07-12 VITALS — BP 136/80 | HR 69 | Ht 69.0 in | Wt 244.0 lb

## 2023-07-12 DIAGNOSIS — R0600 Dyspnea, unspecified: Secondary | ICD-10-CM

## 2023-07-12 DIAGNOSIS — I5042 Chronic combined systolic (congestive) and diastolic (congestive) heart failure: Secondary | ICD-10-CM

## 2023-07-12 DIAGNOSIS — G4733 Obstructive sleep apnea (adult) (pediatric): Secondary | ICD-10-CM

## 2023-07-12 DIAGNOSIS — I10 Essential (primary) hypertension: Secondary | ICD-10-CM

## 2023-07-12 DIAGNOSIS — M546 Pain in thoracic spine: Secondary | ICD-10-CM

## 2023-07-12 DIAGNOSIS — D6869 Other thrombophilia: Secondary | ICD-10-CM

## 2023-07-12 DIAGNOSIS — I480 Paroxysmal atrial fibrillation: Secondary | ICD-10-CM | POA: Diagnosis not present

## 2023-07-12 DIAGNOSIS — M542 Cervicalgia: Secondary | ICD-10-CM

## 2023-07-12 DIAGNOSIS — R053 Chronic cough: Secondary | ICD-10-CM

## 2023-07-12 NOTE — Patient Instructions (Signed)
Medication Instructions:  Your physician recommends that you continue on your current medications as directed. Please refer to the Current Medication list given to you today.  *If you need a refill on your cardiac medications before your next appointment, please call your pharmacy*  Lab Work: None ordered If you have labs (blood work) drawn today and your tests are completely normal, you will receive your results only by: MyChart Message (if you have MyChart) OR A paper copy in the mail If you have any lab test that is abnormal or we need to change your treatment, we will call you to review the results.  Follow-Up: At [facility], you and your health needs are our priority.  As part of our continuing mission to provide you with exceptional heart care, we have created designated Provider Care Teams.  These Care Teams include your primary Cardiologist (physician) and Advanced Practice Providers (APPs -  Physician Assistants and Nurse Practitioners) who all work together to provide you with the care you need, when you need it.  Your next appointment:   6 month(s)  Provider:   Loman Brooklyn, MD    Other Instructions You have been referred to pulmonology.

## 2023-07-16 ENCOUNTER — Encounter: Payer: Self-pay | Admitting: Physical Therapy

## 2023-07-16 ENCOUNTER — Ambulatory Visit: Payer: Self-pay | Admitting: Physical Therapy

## 2023-07-16 DIAGNOSIS — M546 Pain in thoracic spine: Secondary | ICD-10-CM

## 2023-07-16 DIAGNOSIS — M542 Cervicalgia: Secondary | ICD-10-CM
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
## 2023-07-18 ENCOUNTER — Ambulatory Visit: Payer: Self-pay | Admitting: Physical Therapy

## 2023-07-23 ENCOUNTER — Ambulatory Visit: Payer: Self-pay | Admitting: Physical Therapy

## 2023-07-23 ENCOUNTER — Encounter: Payer: Self-pay | Admitting: Physical Therapy

## 2023-07-23 DIAGNOSIS — M546 Pain in thoracic spine: Secondary | ICD-10-CM

## 2023-07-23 DIAGNOSIS — M542 Cervicalgia: Secondary | ICD-10-CM
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
## 2023-07-30 ENCOUNTER — Ambulatory Visit: Payer: Self-pay | Admitting: Physical Therapy

## 2023-07-30 ENCOUNTER — Encounter: Payer: Self-pay | Admitting: Physical Therapy

## 2023-07-30 DIAGNOSIS — M546 Pain in thoracic spine: Secondary | ICD-10-CM

## 2023-07-30 DIAGNOSIS — M542 Cervicalgia: Secondary | ICD-10-CM
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
## 2023-07-31 ENCOUNTER — Ambulatory Visit
Admission: RE | Admit: 2023-07-31 | Discharge: 2023-07-31 | Disposition: A | Discharge status: Home / Self Care | Payer: No Typology Code available for payment source | Source: Ambulatory Visit | Attending: Physical Medicine and Rehabilitation | Admitting: Physical Medicine and Rehabilitation

## 2023-07-31 DIAGNOSIS — K219 Gastro-esophageal reflux disease without esophagitis: Secondary | ICD-10-CM

## 2023-08-06 ENCOUNTER — Ambulatory Visit: Payer: No Typology Code available for payment source | Admitting: Physical Therapy

## 2023-08-13 ENCOUNTER — Ambulatory Visit: Payer: No Typology Code available for payment source | Admitting: Physical Therapy

## 2023-09-11 DIAGNOSIS — R972 Elevated prostate specific antigen [PSA]: Secondary | ICD-10-CM

## 2023-09-11 DIAGNOSIS — N393 Stress incontinence (female) (male): Secondary | ICD-10-CM

## 2023-09-11 DIAGNOSIS — N529 Male erectile dysfunction, unspecified: Secondary | ICD-10-CM

## 2023-09-11 HISTORY — DX: Male erectile dysfunction, unspecified: N52.9

## 2023-09-11 HISTORY — DX: Elevated prostate specific antigen (PSA): R97.20

## 2023-09-11 HISTORY — DX: Stress incontinence (female) (male): N39.3

## 2023-09-18 ENCOUNTER — Other Ambulatory Visit (HOSPITAL_COMMUNITY): Payer: Self-pay | Admitting: Urology

## 2023-09-18 DIAGNOSIS — R9721 Rising PSA following treatment for malignant neoplasm of prostate: Secondary | ICD-10-CM

## 2023-09-22 ENCOUNTER — Ambulatory Visit
Admission: EM | Admit: 2023-09-22 | Discharge: 2023-09-22 | Disposition: A | Discharge status: Home / Self Care | Payer: No Typology Code available for payment source | Attending: Family Medicine | Admitting: Family Medicine

## 2023-09-22 DIAGNOSIS — J018 Other acute sinusitis: Secondary | ICD-10-CM

## 2023-09-22 MED ORDER — AMOXICILLIN-POT CLAVULANATE 875-125 MG PO TABS: 1.0000 | ORAL_TABLET | Freq: Two times a day (BID) | ORAL | 0 refills | Status: DC

## 2023-09-22 MED ORDER — PREDNISONE 20 MG PO TABS: ORAL_TABLET | ORAL | 0 refills | Status: DC

## 2023-09-22 MED ORDER — TOBRAMYCIN 0.3 % OP SOLN: 1.0000 [drp] | OPHTHALMIC | 0 refills | Status: DC

## 2023-09-22 NOTE — ED Provider Notes (Signed)
[facility]  Note:  This document was prepared using Conservation officer, historic buildings and may include unintentional dictation errors.  MRN: [id number] DOB: 04-Sep-1961  Subjective:   Jeffrey Griffin is a 63 y.o. male presenting for 3-week history of persistent sinus congestion, sinus drainage, drainage from either eye, currently draining from the left but initially was in the right.  Has also had sinus drainage and a productive cough.  Underwent a course of azithromycin and did not make any impact.  Has a history of sinus issues, saw his ENT 11 days ago and was advised that he had normal findings.  Patient does have a history of congestive heart failure, last echocardiogram from 03/10/2022 showed an ejection fraction of 55 to 60%.  No history of asthma.  No smoking.  No current facility-administered medications for this encounter.  Current Outpatient Medications:    albuterol (VENTOLIN HFA) 108 (90 Base) MCG/ACT inhaler, Inhale 1-2 puffs into the lungs every 6 (six) hours as needed for wheezing or shortness of breath., Disp: 1 each, Rfl: 0   apixaban (ELIQUIS) 5 MG TABS tablet, Take 1 tablet (5 mg total) by mouth 2 (two) times daily., Disp: 180 tablet, Rfl: 2   atorvastatin (LIPITOR) 40 MG tablet, TAKE 1 TABLET BY MOUTH EVERY DAY, Disp: 90 tablet, Rfl: 3   benzonatate (TESSALON) 200 MG capsule, Take 1 capsule (200 mg total) by mouth 3 (three) times daily as needed., Disp: 20 capsule, Rfl: 0   Carnitine-B5-B6 500-15-5 MG TABS, Take 1 tablet by mouth daily., Disp: , Rfl:    Cholecalciferol (VITAMIN D3) 25 MCG (1000 UT) CAPS, Take 1,000 Units by mouth daily., Disp: , Rfl:    Coenzyme Q10 300 MG CAPS, Take 1 capsule by mouth daily., Disp: , Rfl:    Cyanocobalamin (B-12) 5000 MCG CAPS, Take 1 capsule by mouth daily., Disp: , Rfl:    cyclobenzaprine (FLEXERIL) 10 MG tablet, Take 1 tablet (10 mg total) by mouth at bedtime as needed for muscle spasms., Disp: 30 tablet, Rfl: 0    diclofenac Sodium (VOLTAREN) 1 % GEL, Apply 4 g topically 4 (four) times daily., Disp: 350 g, Rfl: 0   Garlic 1000 MG CAPS, Take 1 capsule by mouth daily., Disp: , Rfl:    Glucosamine-Chondroitin (COSAMIN DS PO), Take 2 tablets by mouth daily., Disp: , Rfl:    Hawthorn 150 MG CAPS, Take 1 capsule by mouth daily at 6 (six) AM., Disp: , Rfl:    metoprolol succinate (TOPROL-XL) 50 MG 24 hr tablet, TAKE 1 TABLET BY MOUTH EVERY DAY WITH OR IMMEDIATELY FOLLOWING A MEAL, Disp: 90 tablet, Rfl: 3   metoprolol tartrate (LOPRESSOR) 25 MG tablet, Take 1 tablet (25 mg total) by mouth every 6 (six) hours as needed (up to 2 doses daily for breakthrough tachycardia)., Disp: 30 tablet, Rfl: 11   Multiple Minerals-Vitamins (CAL MAG ZINC +D3 PO), Take 1 tablet by mouth daily., Disp: , Rfl:    Multiple Vitamin (MULTIVITAMIN WITH MINERALS) TABS tablet, Take 1 tablet by mouth daily., Disp: , Rfl:    Omega-3 1400 MG CAPS, Take 1 capsule by mouth daily., Disp: , Rfl:    sacubitril-valsartan (ENTRESTO) 97-103 MG, Take 1 tablet by mouth 2 (two) times daily., Disp: 180 tablet, Rfl: 3   saline (AYR) GEL, Place 1 Application into the nose every 4 (four) hours as needed (For nasal dryness)., Disp: , Rfl: 0   Taurine 1000 MG CAPS, Take 1 capsule by mouth daily., Disp: ,  Rfl:    triamcinolone ointment (KENALOG) 0.5 %, Apply 1 Application topically 2 (two) times daily., Disp: 30 g, Rfl: 1   vitamin E 180 MG (400 UNITS) capsule, Take 400 Units by mouth daily., Disp: , Rfl:    No Known Allergies  Past Medical History:  Diagnosis Date   Abnormality of thoracic aorta    4.3 CM ECTATIC ASENDING    Cardiopathy    CHF (congestive heart failure) (HCC)    SYSTOLIC   Epistaxis 03/13/2022   Hyperlipidemia    Hypertension    Lung nodule    Obesity    OSA (obstructive sleep apnea)    Persistent atrial fibrillation (HCC)    WITH RVR   Pleural effusion    RIGHT UPPER LOBE AND RIGHT LOWER LOBE      Past Surgical History:   Procedure Laterality Date   APPENDECTOMY     ATRIAL FIBRILLATION ABLATION N/A 07/16/2020   Procedure: ATRIAL FIBRILLATION ABLATION;  Surgeon: Hillis Range, MD;  Location: [facility];  Service: Cardiovascular;  Laterality: N/A;   CARDIOVERSION N/A 10/20/2019   Procedure: CARDIOVERSION;  Surgeon: Elease Hashimoto Deloris Ping, MD;  Location: [facility] ENDOSCOPY;  Service: Cardiovascular;  Laterality: N/A;   CARDIOVERSION N/A 05/31/2020   Procedure: CARDIOVERSION;  Surgeon: Wendall Stade, MD;  Location: [facility] ENDOSCOPY;  Service: Cardiovascular;  Laterality: N/A;   IR ANGIOGRAM PULMONARY BILATERAL SELECTIVE  03/09/2022   IR ANGIOGRAM SELECTIVE EACH ADDITIONAL VESSEL  03/09/2022   IR ANGIOGRAM SELECTIVE EACH ADDITIONAL VESSEL  03/09/2022   IR THROMBECT PRIM MECH INIT (INCLU) MOD SED  03/09/2022   IR US GUIDE VASC ACCESS RIGHT  03/09/2022   PROSTATECTOMY     RADIOLOGY WITH ANESTHESIA N/A 03/09/2022   Procedure: IR WITH ANESTHESIA;  Surgeon: Radiologist, Medication, MD;  Location: [facility] OR;  Service: Radiology;  Laterality: N/A;   VASECTOMY      Family History  Problem Relation Age of Onset   Hypertrophic cardiomyopathy Mother    Stroke Father    Hypertension Father    Hypertension Sister    Atrial fibrillation Brother     Social History   Tobacco Use   Smoking status: Never   Smokeless tobacco: Never  Vaping Use   Vaping status: Never Used  Substance Use Topics   Alcohol use: Not Currently   Drug use: No    ROS   Objective:   Vitals: BP (!) 149/73 (BP Location: Right Arm)   Pulse 70   Temp 98.2 F (36.8 C) (Oral)   Resp 18   SpO2 93%   Physical Exam Constitutional:      General: He is not in acute distress.    Appearance: Normal appearance. He is well-developed and normal weight. He is not ill-appearing, toxic-appearing or diaphoretic.  HENT:     Head: Normocephalic and atraumatic.     Right Ear: Tympanic membrane, ear canal and external ear normal. No drainage, swelling or  tenderness. No middle ear effusion. There is no impacted cerumen. Tympanic membrane is not erythematous or bulging.     Left Ear: Tympanic membrane, ear canal and external ear normal. No drainage, swelling or tenderness.  No middle ear effusion. There is no impacted cerumen. Tympanic membrane is not erythematous or bulging.     Nose: Congestion and rhinorrhea present.     Comments: Left-sided maxillary sinus tenderness.    Mouth/Throat:     Mouth: Mucous membranes are moist.     Pharynx: Oropharynx is clear. No oropharyngeal exudate  or posterior oropharyngeal erythema.  Eyes:     General: Lids are everted, no foreign bodies appreciated. No scleral icterus.       Right eye: No foreign body, discharge or hordeolum.        Left eye: Discharge present.No foreign body or hordeolum.     Extraocular Movements: Extraocular movements intact.     Conjunctiva/sclera:     Right eye: Right conjunctiva is not injected. No chemosis, exudate or hemorrhage.    Left eye: Left conjunctiva is injected. No chemosis, exudate or hemorrhage. Cardiovascular:     Rate and Rhythm: Normal rate and regular rhythm.     Heart sounds: Normal heart sounds. No murmur heard.    No friction rub. No gallop.  Pulmonary:     Effort: Pulmonary effort is normal. No respiratory distress.     Breath sounds: Normal breath sounds. No stridor. No wheezing, rhonchi or rales.  Musculoskeletal:     Cervical back: Normal range of motion and neck supple. No rigidity. No muscular tenderness.  Neurological:     General: No focal deficit present.     Mental Status: He is alert and oriented to person, place, and time.  Psychiatric:        Mood and Affect: Mood normal.        Behavior: Behavior normal.        Thought Content: Thought content normal.        Judgment: Judgment normal.     Assessment and Plan :   PDMP not reviewed this encounter.  1. Acute non-recurrent sinusitis of other sinus    Will start empiric treatment for  sinusitis with Augmentin.  Also recommended an oral prednisone course as patient has typically done very well with this in the past for his sinuses and respiratory airways.  Use tobramycin for the left eye.  Recommended supportive care otherwise. Counseled patient on potential for adverse effects with medications prescribed/recommended today, ER and return-to-clinic precautions discussed, patient verbalized understanding.    Wallis Bamberg, PA-C 09/22/23 1407

## 2023-09-22 NOTE — ED Triage Notes (Signed)
Pt reports sinus congestion, cough, eye discharge since 09/05/23. Zpak gave no relief, last dose 09/11/23. Reports he was seeing by ENT and another UC.

## 2023-09-22 NOTE — Discharge Instructions (Addendum)
We will manage this as a sinus infection with amoxicillin-clavulanate. For sore throat or cough try using a honey-based tea. Use 3 teaspoons of honey with juice squeezed from half lemon. Place shaved pieces of ginger into 1/2-1 cup of water and warm over stove top. Then mix the ingredients and repeat every 4 hours as needed. Please take Tylenol 500mg -650mg  every 6 hours for throat pain, fevers, aches and pains. Hydrate very well with at least 2 liters of water. Eat light meals such as soups (chicken and noodles, vegetable, chicken and wild rice).  Do not eat foods that you are allergic to.  Taking an antihistamine like Zyrtec can help against postnasal drainage, sinus congestion which can cause sinus pain, sinus headaches, throat pain, painful swallowing, coughing.  You can take this together with prednisone. Use tobramycin eye drops to the left eye.

## 2023-09-28 ENCOUNTER — Encounter (HOSPITAL_COMMUNITY): Payer: Self-pay

## 2023-10-09 ENCOUNTER — Encounter (HOSPITAL_COMMUNITY)
Admission: RE | Admit: 2023-10-09 | Discharge: 2023-10-09 | Disposition: A | Discharge status: Home / Self Care | Payer: No Typology Code available for payment source | Source: Ambulatory Visit | Attending: Urology | Admitting: Urology

## 2023-10-09 DIAGNOSIS — R9721 Rising PSA following treatment for malignant neoplasm of prostate: Secondary | ICD-10-CM | POA: Diagnosis present

## 2023-10-09 MED ORDER — FLOTUFOLASTAT F 18 GALLIUM 296-5846 MBQ/ML IV SOLN
8.3260 | Freq: Once | INTRAVENOUS | Status: AC
  Administered 2023-10-09: 8.326 via INTRAVENOUS

## 2023-10-10 ENCOUNTER — Other Ambulatory Visit: Payer: Self-pay | Admitting: Cardiology

## 2023-10-17 ENCOUNTER — Institutional Professional Consult (permissible substitution): Payer: No Typology Code available for payment source | Admitting: Pulmonary Disease

## 2023-10-29 ENCOUNTER — Other Ambulatory Visit: Payer: Self-pay

## 2023-10-29 MED ORDER — ATORVASTATIN CALCIUM 40 MG PO TABS: 40.0000 mg | ORAL_TABLET | Freq: Every day | ORAL | 1 refills | Status: DC

## 2023-10-30 ENCOUNTER — Other Ambulatory Visit: Payer: Self-pay

## 2023-10-30 MED ORDER — METOPROLOL SUCCINATE ER 50 MG PO TB24
50.0000 mg | ORAL_TABLET | Freq: Every day | ORAL | 1 refills | Status: DC
Start: 2023-10-30 — End: 2024-03-12

## 2023-11-30 ENCOUNTER — Encounter: Payer: Self-pay | Admitting: Hematology and Oncology

## 2023-12-04 ENCOUNTER — Ambulatory Visit
Admission: EM | Admit: 2023-12-04 | Discharge: 2023-12-04 | Disposition: A | Discharge status: Home / Self Care | Attending: Family Medicine | Admitting: Family Medicine

## 2023-12-04 DIAGNOSIS — R21 Rash and other nonspecific skin eruption: Secondary | ICD-10-CM | POA: Diagnosis not present

## 2023-12-04 MED ORDER — TRIAMCINOLONE ACETONIDE 0.1 % EX CREA: 1.0000 | TOPICAL_CREAM | Freq: Two times a day (BID) | CUTANEOUS | 0 refills | Status: AC

## 2023-12-04 MED ORDER — PREDNISONE 10 MG (21) PO TBPK: ORAL_TABLET | Freq: Every day | ORAL | 0 refills | Status: DC

## 2023-12-04 MED ORDER — MUPIROCIN 2 % EX OINT: 1.0000 | TOPICAL_OINTMENT | Freq: Two times a day (BID) | CUTANEOUS | 0 refills | Status: AC

## 2023-12-04 NOTE — ED Triage Notes (Addendum)
 Pt states rash for the for the past 3 days.  Red rash noted to bilateral hips right leg and neck. States he took benadryl and used an antifungal cream on it at home.

## 2023-12-04 NOTE — ED Provider Notes (Signed)
 [facility]    CSN: [id number] Arrival date & time: 12/04/23  1251      History   Chief Complaint Chief Complaint  Patient presents with   Rash    HPI Jeffrey Griffin is a 63 y.o. male presents for rash.  Patient reports 3 days of a pruritic red rash on his left leg, back, right and left abdomen, neck.  Denies any fevers chills or URI symptoms/sore throat.  No history of eczema or psoriasis.  No contact with similar rashes.  No new contacts including soaps, detergents, medications, etc.  He does state he scratched 1 area on his thigh so much that it is beginning to drain slightly.  He has been using Benadryl and antifungal cream with minimal improvement.  No other concerns at this time.   Rash   Past Medical History:  Diagnosis Date   Abnormality of thoracic aorta    4.3 CM ECTATIC ASENDING    Cardiopathy    CHF (congestive heart failure) (HCC)    SYSTOLIC   Epistaxis 03/13/2022   Hyperlipidemia    Hypertension    Lung nodule    Obesity    OSA (obstructive sleep apnea)    Persistent atrial fibrillation (HCC)    WITH RVR   Pleural effusion    RIGHT UPPER LOBE AND RIGHT LOWER LOBE     Patient Active Problem List   Diagnosis Date Noted   Chronic cough 05/30/2023   Hyperlipidemia 05/01/2023   Pityriasis rosea 12/18/2022   Knee pain, bilateral 03/12/2022   Pulmonary embolism (HCC) 03/09/2022   OSA (obstructive sleep apnea) 03/09/2022   PE (pulmonary thromboembolism) (HCC) 03/09/2022   S/P prostatectomy    Paroxysmal atrial fibrillation (HCC)    Fatigue 03/04/2021   Subcutaneous nodule of left foot 03/04/2021   Verruca 03/04/2021   Atypical atrial flutter (HCC) 05/26/2020   Secondary hypercoagulable state (HCC) 04/13/2020   Chronic combined systolic and diastolic CHF (congestive heart failure) (HCC) 04/06/2020   Persistent atrial fibrillation (HCC) 10/01/2019   Pulmonary embolus (HCC) 10/01/2019    Past Surgical History:  Procedure Laterality Date    APPENDECTOMY     ATRIAL FIBRILLATION ABLATION N/A 07/16/2020   Procedure: ATRIAL FIBRILLATION ABLATION;  Surgeon: Hillis Range, MD;  Location: [facility];  Service: Cardiovascular;  Laterality: N/A;   CARDIOVERSION N/A 10/20/2019   Procedure: CARDIOVERSION;  Surgeon: Elease Hashimoto Deloris Ping, MD;  Location: [facility] ENDOSCOPY;  Service: Cardiovascular;  Laterality: N/A;   CARDIOVERSION N/A 05/31/2020   Procedure: CARDIOVERSION;  Surgeon: Wendall Stade, MD;  Location: [facility] ENDOSCOPY;  Service: Cardiovascular;  Laterality: N/A;   IR ANGIOGRAM PULMONARY BILATERAL SELECTIVE  03/09/2022   IR ANGIOGRAM SELECTIVE EACH ADDITIONAL VESSEL  03/09/2022   IR ANGIOGRAM SELECTIVE EACH ADDITIONAL VESSEL  03/09/2022   IR THROMBECT PRIM MECH INIT (INCLU) MOD SED  03/09/2022   IR US GUIDE VASC ACCESS RIGHT  03/09/2022   PROSTATECTOMY     RADIOLOGY WITH ANESTHESIA N/A 03/09/2022   Procedure: IR WITH ANESTHESIA;  Surgeon: Radiologist, Medication, MD;  Location: [facility] OR;  Service: Radiology;  Laterality: N/A;   VASECTOMY         Home Medications    Prior to Admission medications   Medication Sig Start Date End Date Taking? Authorizing Provider  mupirocin ointment (BACTROBAN) 2 % Apply 1 Application topically 2 (two) times daily for 7 days. 12/04/23 12/11/23 Yes Radford Pax, NP  predniSONE (STERAPRED UNI-PAK 21 TAB) 10 MG (21) TBPK tablet Take by  mouth daily. Take 6 tabs by mouth daily  for 1 day, then 5 tabs for 1 day, then 4 tabs for 1 day, then 3 tabs for 1 day, 2 tabs for 1 day, then 1 tab by mouth daily for 1 days 12/04/23  Yes Radford Pax, NP  triamcinolone cream (KENALOG) 0.1 % Apply 1 Application topically 2 (two) times daily for 7 days. 12/04/23 12/11/23 Yes Radford Pax, NP  albuterol (VENTOLIN HFA) 108 (90 Base) MCG/ACT inhaler Inhale 1-2 puffs into the lungs every 6 (six) hours as needed for wheezing or shortness of breath. 06/10/23   Radford Pax, NP  amoxicillin-clavulanate (AUGMENTIN) 875-125 MG tablet Take  1 tablet by mouth 2 (two) times daily. 09/22/23   Wallis Bamberg, PA-C  apixaban (ELIQUIS) 5 MG TABS tablet Take 1 tablet (5 mg total) by mouth 2 (two) times daily. 03/26/23   Camnitz, Andree Coss, MD  atorvastatin (LIPITOR) 40 MG tablet Take 1 tablet (40 mg total) by mouth daily. 10/29/23   Nahser, Deloris Ping, MD  benzonatate (TESSALON) 200 MG capsule Take 1 capsule (200 mg total) by mouth 3 (three) times daily as needed. 06/10/23   Radford Pax, NP  Carnitine-B5-B6 500-15-5 MG TABS Take 1 tablet by mouth daily.    [provider]  Cholecalciferol (VITAMIN D3) 25 MCG (1000 UT) CAPS Take 1,000 Units by mouth daily.    [provider]  Coenzyme Q10 300 MG CAPS Take 1 capsule by mouth daily.    [provider]  Cyanocobalamin (B-12) 5000 MCG CAPS Take 1 capsule by mouth daily.    [provider]  cyclobenzaprine (FLEXERIL) 10 MG tablet Take 1 tablet (10 mg total) by mouth at bedtime as needed for muscle spasms. 06/01/23   Marisa Cyphers, MD  diclofenac Sodium (VOLTAREN) 1 % GEL Apply 4 g topically 4 (four) times daily. 01/28/21   Lilland, Alana, DO  Garlic 1000 MG CAPS Take 1 capsule by mouth daily.    [provider]  Glucosamine-Chondroitin (COSAMIN DS PO) Take 2 tablets by mouth daily.    [provider]  Hawthorn 150 MG CAPS Take 1 capsule by mouth daily at 6 (six) AM.    [provider]  metoprolol succinate (TOPROL-XL) 50 MG 24 hr tablet Take 1 tablet (50 mg total) by mouth daily. Take with or immediately following a meal. 10/30/23   Perlie Gold, PA-C  metoprolol tartrate (LOPRESSOR) 25 MG tablet TAKE 1 TABLET BY MOUTH EVERY 6 HOURS AS NEEDED (UP TO 2 DOSES A DAY FOR BREAKTHROUGH TACHYCARDIA) 10/11/23   Perlie Gold, PA-C  Multiple Minerals-Vitamins (CAL MAG ZINC +D3 PO) Take 1 tablet by mouth daily.    [provider]  Multiple Vitamin (MULTIVITAMIN WITH MINERALS) TABS tablet Take 1 tablet by mouth daily.    [provider]  Omega-3 1400 MG CAPS Take 1 capsule by mouth daily.    [provider]  sacubitril-valsartan (ENTRESTO) 97-103 MG Take 1 tablet by mouth 2 (two) times daily. 05/01/23   Perlie Gold, PA-C  saline (AYR) GEL Place 1 Application into the nose every 4 (four) hours as needed (For nasal dryness). 03/14/22   Valetta Close, MD  Taurine 1000 MG CAPS Take 1 capsule by mouth daily.    [provider]  tobramycin (TOBREX) 0.3 % ophthalmic solution Place 1 drop into the left eye every 4 (four) hours. 09/22/23   Wallis Bamberg, PA-C  vitamin E 180 MG (400 UNITS) capsule  Take 400 Units by mouth daily.    [provider]    Family History Family History  Problem Relation Age of Onset   Hypertrophic cardiomyopathy Mother    Stroke Father    Hypertension Father    Hypertension Sister    Atrial fibrillation Brother     Social History Social History   Tobacco Use   Smoking status: Never   Smokeless tobacco: Never  Vaping Use   Vaping status: Never Used  Substance Use Topics   Alcohol use: Not Currently   Drug use: No     Allergies   Patient has no known allergies.   Review of Systems Review of Systems  Skin:  Positive for rash.     Physical Exam Triage Vital Signs ED Triage Vitals  Encounter Vitals Group     BP 12/04/23 1356 (!) 148/94     Systolic BP Percentile --      Diastolic BP Percentile --      Pulse Rate 12/04/23 1356 66     Resp 12/04/23 1356 16     Temp 12/04/23 1356 98.1 F (36.7 C)     Temp Source 12/04/23 1356 Oral     SpO2 12/04/23 1356 96 %     Weight --      Height --      Head Circumference --      Peak Flow --      Pain Score 12/04/23 1357 0     Pain Loc --      Pain Education --      Exclude from Growth Chart --    No data found.  Updated Vital Signs BP (!) 148/94 (BP Location: Right Arm)   Pulse 66   Temp 98.1 F (36.7 C) (Oral)   Resp 16   SpO2 96%   Visual Acuity Right Eye Distance:   Left Eye Distance:    Bilateral Distance:    Right Eye Near:   Left Eye Near:    Bilateral Near:     Physical Exam Vitals and nursing note reviewed.  Constitutional:      General: He is not in acute distress.    Appearance: Normal appearance. He is obese. He is not ill-appearing.  HENT:     Head: Normocephalic and atraumatic.  Eyes:     Pupils: Pupils are equal, round, and reactive to light.  Cardiovascular:     Rate and Rhythm: Normal rate.  Pulmonary:     Effort: Pulmonary effort is normal.  Skin:    General: Skin is warm and dry.     Findings: Rash present. Rash is macular and papular. Rash is not crusting, nodular, purpuric, scaling, urticarial or vesicular.          Comments: There is scattered erythematous macular papular rash on left thigh, lower abdomen, neck, back.  There is 1 area on the left inner thigh with a small scab with slight clear drainage.  No swelling induration or fluctuance.  Neurological:     General: No focal deficit present.     Mental Status: He is alert and oriented to person, place, and time.  Psychiatric:        Mood and Affect: Mood normal.        Behavior: Behavior normal.      UC Treatments / Results  Labs (all labs ordered are listed, but only abnormal results are displayed) Labs Reviewed - No data to display  EKG   Radiology No results found.  Procedures Procedures (including critical care time)  Medications Ordered in UC Medications - No data to display  Initial Impression / Assessment and Plan / UC Course  I have reviewed the triage vital signs and the nursing notes.  Pertinent labs & imaging results that were available during my care of the patient were reviewed by me and considered in my medical decision making (see chart for details).     Reviewed exam and symptoms with patient.  No red flags.  Will start topical triamcinolone twice daily.  To the 1 area on his inner thigh that has been draining clear liquid after vigorous scratching, he  will avoid the steroid cream to this area and apply mupirocin twice daily.  Will start prednisone taper as prescribed.  He has been on prednisone in the past and tolerated well.  He may continue Benadryl if needed.  Advised PCP follow-up in 2 to 3 days for recheck.  ER precautions reviewed and patient verbalized understanding. Final Clinical Impressions(s) / UC Diagnoses   Final diagnoses:  Rash and nonspecific skin eruption     Discharge Instructions      Start prednisone daily as prescribed.  You may use the triamcinolone topical steroid cream to the affected areas twice daily for 1 week.  Use mupirocin antibiotic ointment to the 1 area on your inner thigh and do not apply steroid cream to this area.  Lots of rest and fluids.  Please follow-up with your PCP in 2 to 3 days for recheck.  Please go to the ER for any worsening symptoms.  Hope you feel better soon!    ED Prescriptions     Medication Sig Dispense Auth. Provider   predniSONE (STERAPRED UNI-PAK 21 TAB) 10 MG (21) TBPK tablet Take by mouth daily. Take 6 tabs by mouth daily  for 1 day, then 5 tabs for 1 day, then 4 tabs for 1 day, then 3 tabs for 1 day, 2 tabs for 1 day, then 1 tab by mouth daily for 1 days 21 tablet Radford Pax, NP   triamcinolone cream (KENALOG) 0.1 % Apply 1 Application topically 2 (two) times daily for 7 days. 45 g Radford Pax, NP   mupirocin ointment (BACTROBAN) 2 % Apply 1 Application topically 2 (two) times daily for 7 days. 22 g Radford Pax, NP      PDMP not reviewed this encounter.   Radford Pax, NP 12/04/23 1415

## 2023-12-04 NOTE — Discharge Instructions (Addendum)
 Start prednisone daily as prescribed.  You may use the triamcinolone topical steroid cream to the affected areas twice daily for 1 week.  Use mupirocin antibiotic ointment to the 1 area on your inner thigh and do not apply steroid cream to this area.  Lots of rest and fluids.  Please follow-up with your PCP in 2 to 3 days for recheck.  Please go to the ER for any worsening symptoms.  Hope you feel better soon!

## 2023-12-11 ENCOUNTER — Inpatient Hospital Stay: Payer: No Typology Code available for payment source | Attending: Hematology and Oncology

## 2023-12-11 DIAGNOSIS — Z86718 Personal history of other venous thrombosis and embolism: Secondary | ICD-10-CM | POA: Insufficient documentation

## 2023-12-11 DIAGNOSIS — Z86711 Personal history of pulmonary embolism: Secondary | ICD-10-CM | POA: Insufficient documentation

## 2023-12-11 DIAGNOSIS — Z79899 Other long term (current) drug therapy: Secondary | ICD-10-CM | POA: Diagnosis not present

## 2023-12-11 DIAGNOSIS — C61 Malignant neoplasm of prostate: Secondary | ICD-10-CM | POA: Diagnosis present

## 2023-12-11 DIAGNOSIS — I2699 Other pulmonary embolism without acute cor pulmonale: Secondary | ICD-10-CM

## 2023-12-11 DIAGNOSIS — Z7901 Long term (current) use of anticoagulants: Secondary | ICD-10-CM | POA: Insufficient documentation

## 2023-12-11 LAB — CMP (CANCER CENTER ONLY)
ALT: 26 U/L (ref 0–44)
AST: 21 U/L (ref 15–41)
Albumin: 4.2 g/dL (ref 3.5–5.0)
Alkaline Phosphatase: 78 U/L (ref 38–126)
Anion gap: 8 (ref 5–15)
BUN: 23 mg/dL — ABNORMAL HIGH (ref 6–20)
CO2: 28 mmol/L (ref 22–32)
Calcium: 10.1 mg/dL (ref 8.9–10.3)
Chloride: 101 mmol/L (ref 98–111)
Creatinine: 1.13 mg/dL (ref 0.61–1.24)
GFR, Estimated: >60 mL/min (ref >60)
Glucose, Bld: 164 mg/dL — ABNORMAL HIGH (ref 70–99)
Potassium: 4.4 mmol/L (ref 3.5–5.1)
Sodium: 137 mmol/L (ref 135–145)
Total Bilirubin: 0.6 mg/dL (ref 0.0–1.2)
Total Protein: 7.3 g/dL (ref 6.5–8.1)

## 2023-12-11 LAB — CBC WITH DIFFERENTIAL (CANCER CENTER ONLY)
Abs Immature Granulocytes: 0.06 10*3/uL (ref 0.00–0.07)
Basophils Absolute: 0 10*3/uL (ref 0.0–0.1)
Basophils Relative: 1 %
Eosinophils Absolute: 0.1 10*3/uL (ref 0.0–0.5)
Eosinophils Relative: 3 %
HCT: 48.7 % (ref 39.0–52.0)
Hemoglobin: 16 g/dL (ref 13.0–17.0)
Immature Granulocytes: 1 %
Lymphocytes Relative: 25 %
Lymphs Abs: 1.2 10*3/uL (ref 0.7–4.0)
MCH: 29.9 pg (ref 26.0–34.0)
MCHC: 32.9 g/dL (ref 30.0–36.0)
MCV: 90.9 fL (ref 80.0–100.0)
Monocytes Absolute: 1.1 10*3/uL — ABNORMAL HIGH (ref 0.1–1.0)
Monocytes Relative: 23 %
Neutro Abs: 2.3 10*3/uL (ref 1.7–7.7)
Neutrophils Relative %: 47 %
Platelet Count: 189 10*3/uL (ref 150–400)
RBC: 5.36 MIL/uL (ref 4.22–5.81)
RDW: 13.4 % (ref 11.5–15.5)
WBC Count: 4.8 10*3/uL (ref 4.0–10.5)
nRBC: 0 % (ref 0.0–0.2)

## 2023-12-13 ENCOUNTER — Inpatient Hospital Stay: Payer: No Typology Code available for payment source | Admitting: Hematology and Oncology

## 2023-12-13 ENCOUNTER — Other Ambulatory Visit: Payer: Self-pay | Admitting: Hematology and Oncology

## 2023-12-13 ENCOUNTER — Telehealth: Payer: Self-pay | Admitting: Hematology and Oncology

## 2023-12-13 VITALS — BP 136/80 | HR 53 | Temp 97.8°F | Resp 18 | Wt 250.2 lb

## 2023-12-13 DIAGNOSIS — I2699 Other pulmonary embolism without acute cor pulmonale: Secondary | ICD-10-CM

## 2023-12-13 DIAGNOSIS — C61 Malignant neoplasm of prostate: Secondary | ICD-10-CM | POA: Diagnosis not present

## 2023-12-13 NOTE — Telephone Encounter (Signed)
 Jeffrey Griffin stormed out of the office today without completing the scheduling for his follow up appointments. Vitali asked if I could place his labs a few days before his visit, I advised Howell that it is his preference and I can do what he asks per Dr. Derek Mound approval because the los notes mentioned labs before visit, but not in specifics of it needing to be days before the visit with [name]. Ronith stated that he has to be at work at [employer] and that we can call. I called Ege and left him a detailed message with his scheduling preference.

## 2023-12-13 NOTE — Progress Notes (Signed)
 [facility] [phone]   [phone]  PROGRESS NOTE  Patient Care Team: Patient, No Pcp Per as PCP - General (General Practice) Nahser, Deloris Ping, MD as PCP - Cardiology (Cardiology) Regan Lemming, MD as PCP - Electrophysiology (Cardiology)  Hematological/Oncological History # Recurrent DVT and Pulmonary Embolism 09/15/2019: CTA showed acute subsegmental pulmonary embolism to the right upper and right lower lobes. Started anticoagulation therapy with Eliquis. 03/08/2022-03/14/2022: Admitted after presenting with shortness of breath and back pain after being off Eliquis for 2 weeks due to prostatectomy. CTA chest reveled extensive bilateral pulmonary emboli with saddle pulmonary embolus with presence of right heart strain. Bilateral lower extremity ultrasound significant for DVT involving posterior tibial veins bilaterally. Echo significant for EF 55-60%, moderate left ventricular hypertrophy, aortic dilatation with dilatation of aortic root 42 mm. Dilatation of ascending aorta measuring 41mm. CCM and interventional radiology consulted, patient had thrombectomy on 7/6. Patient was treated with heparin and then transitioned to home Eliquis. 10/10/2022: Establish care with [facility]  Interval History:  Jeffrey Griffin 63 y.o. male with medical history significant for recurrent DVT and PE who presents for a follow up visit.  Jeffrey Griffin has undergone a nuclear send PET CT scan due to rising PSA.  He was found to have isolated lymph node and is currently awaiting management plans from the last urology.  He is had no other recent changes in his health.  He reports that he is tolerating his Eliquis therapy well with no bleeding, bruising, or dark stools.  He reports that if he gets Or has blood drawn he clots very quickly.  Medication remains $10 for 75-month supply.  He notes that he has no signs or symptoms concerning for recurrent VTE such as leg pain, leg  swelling, chest pain, or shortness of breath.  He reports that otherwise he is had no fevers, chills, sweats, nausea, vomiting or diarrhea.  He has no signs or symptoms concerning for recurrent VTE.  A full 10 point ROS is otherwise negative.  MEDICAL HISTORY:  Past Medical History:  Diagnosis Date   Abnormality of thoracic aorta    4.3 CM ECTATIC ASENDING    Cardiopathy    CHF (congestive heart failure) (HCC)    SYSTOLIC   Epistaxis 03/13/2022   Hyperlipidemia    Hypertension    Lung nodule    Obesity    OSA (obstructive sleep apnea)    Persistent atrial fibrillation (HCC)    WITH RVR   Pleural effusion    RIGHT UPPER LOBE AND RIGHT LOWER LOBE     SURGICAL HISTORY: Past Surgical History:  Procedure Laterality Date   APPENDECTOMY     ATRIAL FIBRILLATION ABLATION N/A 07/16/2020   Procedure: ATRIAL FIBRILLATION ABLATION;  Surgeon: Hillis Range, MD;  Location: [facility] INVASIVE CV LAB;  Service: Cardiovascular;  Laterality: N/A;   CARDIOVERSION N/A 10/20/2019   Procedure: CARDIOVERSION;  Surgeon: Elease Hashimoto Deloris Ping, MD;  Location: [facility] ENDOSCOPY;  Service: Cardiovascular;  Laterality: N/A;   CARDIOVERSION N/A 05/31/2020   Procedure: CARDIOVERSION;  Surgeon: Wendall Stade, MD;  Location: [facility] ENDOSCOPY;  Service: Cardiovascular;  Laterality: N/A;   IR ANGIOGRAM PULMONARY BILATERAL SELECTIVE  03/09/2022   IR ANGIOGRAM SELECTIVE EACH ADDITIONAL VESSEL  03/09/2022   IR ANGIOGRAM SELECTIVE EACH ADDITIONAL VESSEL  03/09/2022   IR THROMBECT PRIM MECH INIT (INCLU) MOD SED  03/09/2022   IR US GUIDE VASC ACCESS RIGHT  03/09/2022   PROSTATECTOMY     RADIOLOGY WITH ANESTHESIA N/A  03/09/2022   Procedure: IR WITH ANESTHESIA;  Surgeon: Radiologist, Medication, MD;  Location: [facility] OR;  Service: Radiology;  Laterality: N/A;   VASECTOMY      SOCIAL HISTORY: Social History   Socioeconomic History   Marital status: Married    Spouse name: Not on file   Number of children: Not on file   Years of  education: Not on file   Highest education level: Not on file  Occupational History   Not on file  Tobacco Use   Smoking status: Never   Smokeless tobacco: Never  Vaping Use   Vaping status: Never Used  Substance and Sexual Activity   Alcohol use: Not Currently   Drug use: No   Sexual activity: Yes  Other Topics Concern   Not on file  Social History Narrative   Lives in [place].   Social Drivers of Health   Financial Resource Strain: Patient Declined (01/15/2023)   Overall Financial Resource Strain (CARDIA)    Difficulty of Paying Living Expenses: Patient declined  Food Insecurity: Patient Declined (01/15/2023)   Hunger Vital Sign    Worried About Running Out of Food in the Last Year: Patient declined    Ran Out of Food in the Last Year: Patient declined  Transportation Needs: Patient Declined (01/15/2023)   PRAPARE - Administrator, Civil Service (Medical): Patient declined    Lack of Transportation (Non-Medical): Patient declined  Physical Activity: Unknown (01/15/2023)   Exercise Vital Sign    Days of Exercise per Week: Patient declined    Minutes of Exercise per Session: Not on file  Stress: Patient Declined (01/15/2023)   [employer] of Occupational Health - Occupational Stress Questionnaire    Feeling of Stress : Patient declined  Social Connections: Unknown (01/15/2023)   Social Connection and Isolation Panel [NHANES]    Frequency of Communication with Friends and Family: Patient declined    Frequency of Social Gatherings with Friends and Family: Patient declined    Attends Religious Services: Patient declined    Database administrator or Organizations: Patient declined    Attends Banker Meetings: Not on file    Marital Status: Patient declined  Intimate Partner Violence: Not At Risk (10/10/2022)   Humiliation, Afraid, Rape, and Kick questionnaire    Fear of Current or Ex-Partner: No    Emotionally Abused: No    Physically  Abused: No    Sexually Abused: No    FAMILY HISTORY: Family History  Problem Relation Age of Onset   Hypertrophic cardiomyopathy Mother    Stroke Father    Hypertension Father    Hypertension Sister    Atrial fibrillation Brother     ALLERGIES:  has no known allergies.  MEDICATIONS:  Current Outpatient Medications  Medication Sig Dispense Refill   albuterol (VENTOLIN HFA) 108 (90 Base) MCG/ACT inhaler Inhale 1-2 puffs into the lungs every 6 (six) hours as needed for wheezing or shortness of breath. 1 each 0   amoxicillin-clavulanate (AUGMENTIN) 875-125 MG tablet Take 1 tablet by mouth 2 (two) times daily. 20 tablet 0   apixaban (ELIQUIS) 5 MG TABS tablet Take 1 tablet (5 mg total) by mouth 2 (two) times daily. 180 tablet 2   atorvastatin (LIPITOR) 40 MG tablet Take 1 tablet (40 mg total) by mouth daily. 90 tablet 1   benzonatate (TESSALON) 200 MG capsule Take 1 capsule (200 mg total) by mouth 3 (three) times daily as needed. 20 capsule 0   Carnitine-B5-B6  500-15-5 MG TABS Take 1 tablet by mouth daily.     Cholecalciferol (VITAMIN D3) 25 MCG (1000 UT) CAPS Take 1,000 Units by mouth daily.     Coenzyme Q10 300 MG CAPS Take 1 capsule by mouth daily.     Cyanocobalamin (B-12) 5000 MCG CAPS Take 1 capsule by mouth daily.     cyclobenzaprine (FLEXERIL) 10 MG tablet Take 1 tablet (10 mg total) by mouth at bedtime as needed for muscle spasms. 30 tablet 0   diclofenac Sodium (VOLTAREN) 1 % GEL Apply 4 g topically 4 (four) times daily. 350 g 0   Garlic 1000 MG CAPS Take 1 capsule by mouth daily.     Glucosamine-Chondroitin (COSAMIN DS PO) Take 2 tablets by mouth daily.     Hawthorn 150 MG CAPS Take 1 capsule by mouth daily at 6 (six) AM.     metoprolol succinate (TOPROL-XL) 50 MG 24 hr tablet Take 1 tablet (50 mg total) by mouth daily. Take with or immediately following a meal. 90 tablet 1   metoprolol tartrate (LOPRESSOR) 25 MG tablet TAKE 1 TABLET BY MOUTH EVERY 6 HOURS AS NEEDED (UP TO 2  DOSES A DAY FOR BREAKTHROUGH TACHYCARDIA) 30 tablet 6   Multiple Minerals-Vitamins (CAL MAG ZINC +D3 PO) Take 1 tablet by mouth daily.     Multiple Vitamin (MULTIVITAMIN WITH MINERALS) TABS tablet Take 1 tablet by mouth daily.     Omega-3 1400 MG CAPS Take 1 capsule by mouth daily.     predniSONE (STERAPRED UNI-PAK 21 TAB) 10 MG (21) TBPK tablet Take by mouth daily. Take 6 tabs by mouth daily  for 1 day, then 5 tabs for 1 day, then 4 tabs for 1 day, then 3 tabs for 1 day, 2 tabs for 1 day, then 1 tab by mouth daily for 1 days 21 tablet 0   sacubitril-valsartan (ENTRESTO) 97-103 MG Take 1 tablet by mouth 2 (two) times daily. 180 tablet 3   saline (AYR) GEL Place 1 Application into the nose every 4 (four) hours as needed (For nasal dryness).  0   Taurine 1000 MG CAPS Take 1 capsule by mouth daily.     tobramycin (TOBREX) 0.3 % ophthalmic solution Place 1 drop into the left eye every 4 (four) hours. 5 mL 0   vitamin E 180 MG (400 UNITS) capsule Take 400 Units by mouth daily.     No current facility-administered medications for this visit.    REVIEW OF SYSTEMS:   Constitutional: ( - ) fevers, ( - )  chills , ( - ) night sweats Eyes: ( - ) blurriness of vision, ( - ) double vision, ( - ) watery eyes Ears, nose, mouth, throat, and face: ( - ) mucositis, ( - ) sore throat Respiratory: ( - ) cough, ( - ) dyspnea, ( - ) wheezes Cardiovascular: ( - ) palpitation, ( - ) chest discomfort, ( - ) lower extremity swelling Gastrointestinal:  ( - ) nausea, ( - ) heartburn, ( - ) change in bowel habits Skin: ( - ) abnormal skin rashes Lymphatics: ( - ) new lymphadenopathy, ( - ) easy bruising Neurological: ( - ) numbness, ( - ) tingling, ( - ) new weaknesses Behavioral/Psych: ( - ) mood change, ( - ) new changes  All other systems were reviewed with the patient and are negative.  PHYSICAL EXAMINATION: There were no vitals filed for this visit.  There were no vitals filed for this visit.   GENERAL:  Well-appearing middle-age Caucasian male, alert, no distress and comfortable SKIN: skin color, texture, turgor are normal, no rashes or significant lesions EYES: conjunctiva are pink and non-injected, sclera clear LUNGS: clear to auscultation and percussion with normal breathing effort HEART: regular rate & rhythm and no murmurs and no lower extremity edema Musculoskeletal: no cyanosis of digits and no clubbing  PSYCH: alert & oriented x 3, fluent speech NEURO: no focal motor/sensory deficits  LABORATORY DATA:  I have reviewed the data as listed    Latest Ref Rng & Units 12/11/2023    7:29 AM 06/12/2023    7:26 AM 10/10/2022   12:27 PM  CBC  WBC 4.0 - 10.5 K/uL 4.8  8.5  5.6   Hemoglobin 13.0 - 17.0 g/dL 14.7  82.9  56.2   Hematocrit 39.0 - 52.0 % 48.7  48.5  46.5   Platelets 150 - 400 K/uL 189  237  245        Latest Ref Rng & Units 12/11/2023    7:29 AM 06/12/2023    7:26 AM 10/10/2022   12:27 PM  CMP  Glucose 70 - 99 mg/dL 130  99  98   BUN 6 - 20 mg/dL 23  19  14    Creatinine 0.61 - 1.24 mg/dL 8.65  7.84  6.96   Sodium 135 - 145 mmol/L 137  140  140   Potassium 3.5 - 5.1 mmol/L 4.4  3.7  4.5   Chloride 98 - 111 mmol/L 101  106  106   CO2 22 - 32 mmol/L 28  26  29    Calcium 8.9 - 10.3 mg/dL 29.5  28.4  13.2   Total Protein 6.5 - 8.1 g/dL 7.3  7.3  6.7   Total Bilirubin 0.0 - 1.2 mg/dL 0.6  0.7  0.7   Alkaline Phos 38 - 126 U/L 78  77  84   AST 15 - 41 U/L 21  17  23    ALT 0 - 44 U/L 26  18  24     RADIOGRAPHIC STUDIES: No results found.  ASSESSMENT & PLAN Jeffrey Griffin is a 63 y.o. male who presents to the hematology clinic for follow up of recurrent venous thromboembolism.  We reviewed patient's history in detail do not feel that he failed Eliquis therapy since secondary to interruption of anticoagulation therapy and undergoing surgery.  Patient has no family history of clotting disorders.   #Recurrent PE/DVT: --Recommend indefinite anticoagulation due to recurrent  thrombotic episodes --Currently on Eliquis 5 mg PO twice daily. Due to large clot burden with most recent episode, recommend to continue on full strength dose.  --Labs today show white blood cell 4.8, Hgb 16.0, MCV 90.9, Plt 189  --RTC in 6 months with labs unless above workup requires more intervention   #Prostate cancer: --Underwent RARP plus bilateral pelvic lymph node dissections on 03/01/22 at [facility]. Pathology shows a pT3bN0 Margins Negative Gleason 4+5 = 9 . --Now under the care of [facility], last PSMA scan on 10/09/2023. Showed isolated left obturator nodal metastasis, progressive since 01/17/2022  --patient awaiting plan from [facility] -- offered to provide advice/support if needed. If [facility] requests our assistance in management we are happy to help.   No orders of the defined types were placed in this encounter.   All questions were answered. The patient knows to call the clinic with any problems, questions or concerns.  A total of more than 30 minutes were spent on this  encounter with face-to-face time and non-face-to-face time, including preparing to see the patient, ordering tests and/or medications, counseling the patient and coordination of care as outlined above.   Ulysees Barns, MD Department of Hematology/Oncology [facility] Phone: [phone] Pager: [phone] Email: [email or web address]  12/13/2023 7:39 AM

## 2023-12-14 ENCOUNTER — Encounter: Payer: Self-pay | Admitting: Cardiovascular Disease

## 2023-12-26 ENCOUNTER — Other Ambulatory Visit: Payer: Self-pay | Admitting: Cardiology

## 2023-12-26 ENCOUNTER — Telehealth: Payer: Self-pay | Admitting: Cardiovascular Disease

## 2023-12-26 NOTE — Telephone Encounter (Signed)
 Received refill request from pharmacy, refill has been sent to pharmacy already.

## 2023-12-26 NOTE — Telephone Encounter (Signed)
 Pt last saw Michaelle Adolphus, [place] on 07/12/23, last labs 12/11/23 Creat 1.13, age 63, weight 113.5kg, based on specified criteria pt is on appropriate dosage of Eliquis  5mg  BID for afib.  Will refill rx.

## 2023-12-26 NOTE — Telephone Encounter (Signed)
*  STAT* If patient is at the pharmacy, call can be transferred to refill team.   1. Which medications need to be refilled? (please list name of each medication and dose if known)   apixaban  (ELIQUIS ) 5 MG TABS tablet   2. Would you like to learn more about the convenience, safety, & potential cost savings by using the [facility]?   3. Are you open to using the [facility] (Type [facility]. ).  4. Which pharmacy/location (including street and city if local pharmacy) is medication to be sent to?  [id number] - [place], [place] - [address]   5. Do they need a 30 day or 90 day supply?   90 days  Caller Tyra Galley) stated patient said he only has 2 days left of this medication.

## 2023-12-27 ENCOUNTER — Encounter: Payer: Self-pay | Admitting: Radiation Oncology

## 2023-12-27 NOTE — Progress Notes (Signed)
 GU Location of Tumor / Histology: Prostate Ca (biochemical recurrence)  Gleason (4+5=9) Radical Prostatectomy (2023)  PSA 1.67 on 12/04/2023 PSA 1.01 on 09/04/2023 PSA 0.37 on 11/2022 PSA 0.54 on 08/2022 PSA 0.5  on  06/2022 PSA 0.45 on 04/2022 PSA 0.89 on 03/2022  Ronita Cohens presented as referral from Dr. Salli Crawley ([facility]) elevated PSA.  10/09/2023 Dr. Salli Crawley NM PET (PSMA) Skull to Mid Thigh CLINICAL DATA:  Elevated PSA following treatment for malignant neoplasm of prostate. PSA of 1.0 09/04/2023. Prior prostatectomy.  IMPRESSION: 1. Isolated left obturator nodal metastasis, progressive since 01/17/2022 (per that report as images not available). 2. Prostatectomy without local recurrence. 3. Isolated tracer affinity within the left glenoid, favored to be degenerative. 4. Incidental findings, including: Coronary artery atherosclerosis. Aortic Atherosclerosis (ICD10-I70.0). Nonspecific right lower lobe 3 mm pulmonary nodule.    Past/Anticipated interventions by urology, if any: NA  Past/Anticipated interventions by medical oncology, if any: NA  Weight changes, if any:  No  IPSS:  1 SHIM:  5  Bowel/Bladder complaints, if any:  No  Nausea/Vomiting, if any: No  Pain issues, if any:  0/10  SAFETY ISSUES: Prior radiation?  No Pacemaker/ICD? No Possible current pregnancy?  Male Is the patient on methotrexate? No  Current Complaints / other details:  Had non-nerve sparing surgery done in [facility]

## 2024-01-03 ENCOUNTER — Ambulatory Visit
Admission: RE | Admit: 2024-01-03 | Discharge: 2024-01-03 | Disposition: A | Discharge status: Home / Self Care | Source: Ambulatory Visit | Attending: Radiation Oncology | Admitting: Radiation Oncology

## 2024-01-03 ENCOUNTER — Encounter: Payer: Self-pay | Admitting: Urology

## 2024-01-03 ENCOUNTER — Encounter: Payer: Self-pay | Admitting: Radiation Oncology

## 2024-01-03 VITALS — BP 136/89 | HR 72 | Temp 97.7°F | Resp 18 | Ht 69.0 in | Wt 250.0 lb

## 2024-01-03 DIAGNOSIS — C775 Secondary and unspecified malignant neoplasm of intrapelvic lymph nodes: Secondary | ICD-10-CM | POA: Diagnosis not present

## 2024-01-03 DIAGNOSIS — G4730 Sleep apnea, unspecified: Secondary | ICD-10-CM | POA: Diagnosis not present

## 2024-01-03 DIAGNOSIS — Z79899 Other long term (current) drug therapy: Secondary | ICD-10-CM | POA: Insufficient documentation

## 2024-01-03 DIAGNOSIS — I509 Heart failure, unspecified: Secondary | ICD-10-CM | POA: Insufficient documentation

## 2024-01-03 DIAGNOSIS — I110 Hypertensive heart disease with heart failure: Secondary | ICD-10-CM | POA: Diagnosis not present

## 2024-01-03 DIAGNOSIS — Z7901 Long term (current) use of anticoagulants: Secondary | ICD-10-CM | POA: Insufficient documentation

## 2024-01-03 DIAGNOSIS — I4891 Unspecified atrial fibrillation: Secondary | ICD-10-CM | POA: Insufficient documentation

## 2024-01-03 DIAGNOSIS — Z791 Long term (current) use of non-steroidal anti-inflammatories (NSAID): Secondary | ICD-10-CM | POA: Insufficient documentation

## 2024-01-03 DIAGNOSIS — E785 Hyperlipidemia, unspecified: Secondary | ICD-10-CM | POA: Insufficient documentation

## 2024-01-03 DIAGNOSIS — Z86711 Personal history of pulmonary embolism: Secondary | ICD-10-CM | POA: Insufficient documentation

## 2024-01-03 DIAGNOSIS — C61 Malignant neoplasm of prostate: Secondary | ICD-10-CM

## 2024-01-03 DIAGNOSIS — E669 Obesity, unspecified: Secondary | ICD-10-CM | POA: Diagnosis not present

## 2024-01-03 HISTORY — DX: Unspecified osteoarthritis, unspecified site: M19.90

## 2024-01-03 HISTORY — DX: Other pulmonary embolism without acute cor pulmonale: I26.99

## 2024-01-03 HISTORY — DX: Unspecified atrial fibrillation: I48.91

## 2024-01-03 NOTE — Progress Notes (Signed)
 Radiation Oncology         [phone] ________________________________  Initial Outpatient Consultation  Name: Jeffrey Griffin MRN: [id number]  Date: 01/03/2024  DOB: 1961/02/17  XB:[id number], No Pcp Per  Andrez Banker, MD   REFERRING PHYSICIAN: Andrez Banker, MD  DIAGNOSIS: 63 y.o. gentleman with a rising PSA of 1.89 and oligometastatic disease in a single left obturator lymph node s/p RALP 02/2022 for Stage pT3bN0, Gleason 4+5 prostate cancer    ICD-10-CM   1. Malignant neoplasm of prostate (HCC)  C61     2. 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 male with a diagnosis of biochemically recurrent prostate cancer. Of note, he has also been followed by Dr. Rosaline Coma in hematology for recurrent pulmonary emboli. He was initially referred to Dr. Secundino Dach in 12/2019 for an elevated PSA of 9.9. His PSA fluctuated over the years but got up to 11.9 in 10/2021 so TRUSPBx was recommended. He sought a second opinion with Dr. Nanine Babcock at [facility] on 10/27/21 and was subsequently diagnosed with Gleason 4+5 prostate cancer involving 2 of 17 cores on biopsy 12/22/21 with Dr. Nanine Babcock. Staging CT C/A/P on 01/17/22 showed, aside from enhancement in the prostate, a prominent but nonspecific left obturator lymph node measuring 8 mm. He opted to proceed with RALP on 03/01/22 under the care of Dr. Nanine Babcock. Final surgical pathology revealed Gleason 4+5 prostatic adenocarcinoma with focal extraprostatic extension at the left bladder base and left seminal vesicle invasion. All margins, right seminal vesicle, and all 11 sampled lymph nodes were negative and his initial postoperative PSA was 0.89 on 03/23/22 and 0.45 when repeated in 04/2022.  He transferred his care back to Dr. Dulcy Gibney at [facility] urology in 06/2022.  The PSA remained relatively stable through 11/2022 but increased up to 1.01 in 08/2023. A restaging PSMA PET scan was performed on  10/09/23 showing an isolated left obturator nodal metastasis measuring 1.4 cm but no evidence of local recurrence in the fossa and no evidence of skeletal metastasis. His most recent PSA obtained on 12/31/23 had increased further, to 1.89 so he was started on Orgovyx at that time.  The patient reviewed the pathology, imaging and PSA results with his urologist and he has kindly been referred today for discussion of potential salvage radiation treatment options.  PREVIOUS RADIATION THERAPY: No  PAST MEDICAL HISTORY:  Past Medical History:  Diagnosis Date   Abnormality of thoracic aorta    4.3 CM ECTATIC ASENDING    Arthritis    Atrial fibrillation (HCC)    Cardiopathy    CHF (congestive heart failure) (HCC)    SYSTOLIC   ED (erectile dysfunction) 09/11/2023   6213,[id number]   Elevated PSA 09/11/2023   08/22/2022, 2022, 2021   Epistaxis 03/13/2022   Hyperlipidemia    Hypertension    Lung nodule    Muscle weakness (generalized) 03/12/2023   01/01/2023, 11/30/2022, 11/13/2022, 10/23/2022   Obesity    OSA (obstructive sleep apnea)    Persistent atrial fibrillation (HCC)    WITH RVR   Pleural effusion    RIGHT UPPER LOBE AND RIGHT LOWER LOBE    Pulmonary embolism (HCC)    Stress incontinence, male 09/11/2023   03/12/2023, 01/01/2023, 12/07/2022, 11/30/2022, 11/13/2022   Urinary frequency 04/11/2022      PAST SURGICAL HISTORY: Past Surgical History:  Procedure Laterality Date   APPENDECTOMY     ATRIAL FIBRILLATION ABLATION  N/A 07/16/2020   Procedure: ATRIAL FIBRILLATION ABLATION;  Surgeon: Jolly Needle, MD;  Location: [facility] INVASIVE CV LAB;  Service: Cardiovascular;  Laterality: N/A;   CARDIOVERSION N/A 10/20/2019   Procedure: CARDIOVERSION;  Surgeon: Alroy Aspen Lela Purple, MD;  Location: [facility] ENDOSCOPY;  Service: Cardiovascular;  Laterality: N/A;   CARDIOVERSION N/A 05/31/2020   Procedure: CARDIOVERSION;  Surgeon: Loyde Rule, MD;  Location: [facility] ENDOSCOPY;  Service: Cardiovascular;  Laterality: N/A;    IR ANGIOGRAM PULMONARY BILATERAL SELECTIVE  03/09/2022   IR ANGIOGRAM SELECTIVE EACH ADDITIONAL VESSEL  03/09/2022   IR ANGIOGRAM SELECTIVE EACH ADDITIONAL VESSEL  03/09/2022   IR THROMBECT PRIM MECH INIT (INCLU) MOD SED  03/09/2022   IR US  GUIDE VASC ACCESS RIGHT  03/09/2022   PROSTATE BIOPSY     PROSTATECTOMY     RADIOLOGY WITH ANESTHESIA N/A 03/09/2022   Procedure: IR WITH ANESTHESIA;  Surgeon: Radiologist, Medication, MD;  Location: [facility] OR;  Service: Radiology;  Laterality: N/A;   SHOULDER SURGERY Right    VASECTOMY      FAMILY HISTORY:  Family History  Problem Relation Age of Onset   Hypertrophic cardiomyopathy Mother    Stroke Father    Hypertension Father    Hypertension Sister    Atrial fibrillation Brother     SOCIAL HISTORY:  Social History   Socioeconomic History   Marital status: Married    Spouse name: Not on file   Number of children: Not on file   Years of education: Not on file   Highest education level: Not on file  Occupational History   Not on file  Tobacco Use   Smoking status: Never   Smokeless tobacco: Never  Vaping Use   Vaping status: Never Used  Substance and Sexual Activity   Alcohol use: Not Currently   Drug use: No   Sexual activity: Yes  Other Topics Concern   Not on file  Social History Narrative   Lives in [place].   Social Drivers of Health   Financial Resource Strain: Patient Declined (01/15/2023)   Overall Financial Resource Strain (CARDIA)    Difficulty of Paying Living Expenses: Patient declined  Food Insecurity: Patient Declined (01/15/2023)   Hunger Vital Sign    Worried About Running Out of Food in the Last Year: Patient declined    Ran Out of Food in the Last Year: Patient declined  Transportation Needs: Patient Declined (01/15/2023)   PRAPARE - Administrator, Civil Service (Medical): Patient declined    Lack of Transportation (Non-Medical): Patient declined  Physical Activity: Unknown  (01/15/2023)   Exercise Vital Sign    Days of Exercise per Week: Patient declined    Minutes of Exercise per Session: Not on file  Stress: Patient Declined (01/15/2023)   [employer] of Occupational Health - Occupational Stress Questionnaire    Feeling of Stress : Patient declined  Social Connections: Unknown (01/15/2023)   Social Connection and Isolation Panel [NHANES]    Frequency of Communication with Friends and Family: Patient declined    Frequency of Social Gatherings with Friends and Family: Patient declined    Attends Religious Services: Patient declined    Database administrator or Organizations: Patient declined    Attends Banker Meetings: Not on file    Marital Status: Patient declined  Intimate Partner Violence: Not At Risk (10/10/2022)   Humiliation, Afraid, Rape, and Kick questionnaire    Fear of Current or Ex-Partner: No    Emotionally Abused: No  Physically Abused: No    Sexually Abused: No    ALLERGIES: Patient has no known allergies.  MEDICATIONS:  Current Outpatient Medications  Medication Sig Dispense Refill   Azelastine HCl 137 MCG/SPRAY SOLN Place into both nostrils.     ORGOVYX 120 MG tablet Take 120 mg by mouth daily.     tadalafil (CIALIS) 5 MG tablet Take 5 mg by mouth daily.     albuterol  (VENTOLIN  HFA) 108 (90 Base) MCG/ACT inhaler Inhale 1-2 puffs into the lungs every 6 (six) hours as needed for wheezing or shortness of breath. 1 each 0   apixaban  (ELIQUIS ) 5 MG TABS tablet TAKE 1 TABLET BY MOUTH TWICE A DAY 180 tablet 1   atorvastatin  (LIPITOR) 40 MG tablet Take 1 tablet (40 mg total) by mouth daily. 90 tablet 1   Carnitine-B5-B6 500-15-5 MG TABS Take 1 tablet by mouth daily.     Cholecalciferol (VITAMIN D3) 25 MCG (1000 UT) CAPS Take 1,000 Units by mouth daily.     Coenzyme Q10 300 MG CAPS Take 1 capsule by mouth daily.     Cyanocobalamin (B-12) 5000 MCG CAPS Take 1 capsule by mouth daily.     cyclobenzaprine  (FLEXERIL ) 10 MG  tablet Take 1 tablet (10 mg total) by mouth at bedtime as needed for muscle spasms. 30 tablet 0   diclofenac  Sodium (VOLTAREN ) 1 % GEL Apply 4 g topically 4 (four) times daily. 350 g 0   Garlic 1000 MG CAPS Take 1 capsule by mouth daily.     Glucosamine-Chondroitin (COSAMIN DS PO) Take 2 tablets by mouth daily.     Hawthorn 150 MG CAPS Take 1 capsule by mouth daily at 6 (six) AM.     metoprolol  succinate (TOPROL -XL) 50 MG 24 hr tablet Take 1 tablet (50 mg total) by mouth daily. Take with or immediately following a meal. 90 tablet 1   metoprolol  tartrate (LOPRESSOR ) 25 MG tablet TAKE 1 TABLET BY MOUTH EVERY 6 HOURS AS NEEDED (UP TO 2 DOSES A DAY FOR BREAKTHROUGH TACHYCARDIA) 30 tablet 6   Multiple Minerals-Vitamins (CAL MAG ZINC +D3 PO) Take 1 tablet by mouth daily.     Multiple Vitamin (MULTIVITAMIN WITH MINERALS) TABS tablet Take 1 tablet by mouth daily.     Omega-3 1400 MG CAPS Take 1 capsule by mouth daily.     sacubitril -valsartan  (ENTRESTO ) 97-103 MG Take 1 tablet by mouth 2 (two) times daily. 180 tablet 3   saline (AYR) GEL Place 1 Application into the nose every 4 (four) hours as needed (For nasal dryness).  0   Taurine 1000 MG CAPS Take 1 capsule by mouth daily.     vitamin E 180 MG (400 UNITS) capsule Take 400 Units by mouth daily.     No current facility-administered medications for this encounter.    REVIEW OF SYSTEMS:  On review of systems, the patient reports that he is doing well overall. He denies any chest pain, shortness of breath, cough, fevers, chills, night sweats, unintended weight changes. He denies any bowel disturbances, and denies abdominal pain, nausea or vomiting. He denies any new musculoskeletal or joint aches or pains. His IPSS was 1, indicating minimal urinary symptoms. His SHIM was 5, indicating he has severe postoperative erectile dysfunction. A complete review of systems is obtained and is otherwise negative.    PHYSICAL EXAM:  Wt Readings from Last 3  Encounters:  12/13/23 250 lb 4 oz (113.5 kg)  07/12/23 244 lb (110.7 kg)  06/20/23 241 lb (109.3  kg)   Temp Readings from Last 3 Encounters:  12/13/23 97.8 F (36.6 C) (Temporal)  12/04/23 98.1 F (36.7 C) (Oral)  09/22/23 98.2 F (36.8 C) (Oral)   BP Readings from Last 3 Encounters:  12/13/23 136/80  12/04/23 (!) 148/94  09/22/23 (!) 149/73   Pulse Readings from Last 3 Encounters:  12/13/23 (!) 53  12/04/23 66  09/22/23 70    /10  In general this is a well appearing Caucasian man in no acute distress. He's alert and oriented x4 and appropriate throughout the examination. Cardiopulmonary assessment is negative for acute distress, and he exhibits normal effort.     KPS = 100  100 - Normal; no complaints; no evidence of disease. 90   - Able to carry on normal activity; minor signs or symptoms of disease. 80   - Normal activity with effort; some signs or symptoms of disease. 9   - Cares for self; unable to carry on normal activity or to do active work. 60   - Requires occasional assistance, but is able to care for most of his personal needs. 50   - Requires considerable assistance and frequent medical care. 40   - Disabled; requires special care and assistance. 30   - Severely disabled; hospital admission is indicated although death not imminent. 20   - Very sick; hospital admission necessary; active supportive treatment necessary. 10   - Moribund; fatal processes progressing rapidly. 0     - Dead  Karnofsky DA, Abelmann WH, Craver LS and Burchenal [facility] [phone]) The use of the nitrogen mustards in the palliative treatment of carcinoma: with particular reference to bronchogenic carcinoma Cancer 1 634-56  LABORATORY DATA:  Lab Results  Component Value Date   WBC 4.8 12/11/2023   HGB 16.0 12/11/2023   HCT 48.7 12/11/2023   MCV 90.9 12/11/2023   PLT 189 12/11/2023   Lab Results  Component Value Date   NA 137 12/11/2023   K 4.4 12/11/2023   CL 101 12/11/2023   CO2 28  12/11/2023   Lab Results  Component Value Date   ALT 26 12/11/2023   AST 21 12/11/2023   ALKPHOS 78 12/11/2023   BILITOT 0.6 12/11/2023     RADIOGRAPHY: No results found.    IMPRESSION/PLAN: 1. 63 y.o. gentleman with a rising PSA of 1.89 and oligometastatic disease in a single left obturator lymph node s/p RALP 02/2022 for Stage pT3bN0, Gleason 4+5 prostate cancer.  Today we reviewed the findings and workup thus far.  We discussed the natural history of prostate cancer.  We reviewed the the implications of positive margins, extracapsular extension, and seminal vesicle involvement on the risk of prostate cancer recurrence. In his case, extraprostatic extension and unilateral seminal vesicle invasion were present at the time of surgery, along with detectable, rising postoperative PSA and oligometastasis in a solitary left obturator lymph node confirmed on recent PSMA PET imaging.  We reviewed some of the evidence suggesting an advantage for patients who undergo salvage radiotherapy in this setting in terms of disease control and overall survival. We discussed the recommended 7.5 week course of daily external beam radiation treatment directed to the prostatic fossa and pelvic lymph nodes with regard to the logistics and delivery of external beam radiation treatment.   At the conclusion of our conversation, the patient is interested in moving forward with the recommended 7.5 week course of salvage external beam therapy to the prostatic fossa and pelvic lymph nodes with a boost to the PET  positive left obturator node; concurrent with ADT.  He has freely signed written consent to proceed today in the office and a copy of this document will be placed in his medical record.  He is tentatively scheduled for CT simulation at 8 AM on 01/09/2024, so we will share our discussion with Dr. Dulcy Gibney and proceed with treatment planning accordingly, in anticipation of beginning his treatments in the near future since he  has already started Orgovyx ADT on 12/31/2023.  The patient appears to have a good understanding of his disease and our treatment recommendations which are of curative intent and is in agreement with the stated plan.  We enjoyed meeting him today and look forward to continuing to participate in his care.  He knows that he is welcome to call at anytime in the interim with any questions or concerns regarding our discussion today.  We personally spent 75 minutes in this encounter including chart review, reviewing radiological studies, meeting face-to-face with the patient, entering orders and completing documentation.    Arta Bihari, PA-C    Kenith Payer, MD  [facility]  Radiation Oncology Direct Dial: [phone]  Fax: [phone] [email or web address]  Skype  [email or web address]   This document serves as a record of services personally performed by Kenith Payer, MD and Keitha Pata, PA-C. It was created on their behalf by Florance Hun, a trained medical scribe. The creation of this record is based on the scribe's personal observations and the provider's statements to them. This document has been checked and approved by the attending provider.

## 2024-01-04 ENCOUNTER — Other Ambulatory Visit: Payer: Self-pay

## 2024-01-04 ENCOUNTER — Inpatient Hospital Stay
Admission: RE | Admit: 2024-01-04 | Discharge: 2024-01-04 | Disposition: A | Discharge status: Home / Self Care | Payer: Self-pay | Source: Ambulatory Visit | Attending: Radiation Oncology | Admitting: Radiation Oncology

## 2024-01-04 DIAGNOSIS — C61 Malignant neoplasm of prostate: Secondary | ICD-10-CM

## 2024-01-04 NOTE — Progress Notes (Signed)
 Introduced myself to the patient as the prostate nurse navigator.  He is here to discuss his radiation treatment options. RN will work to obtain MRI images at [facility] prior to planning appointment. I gave him my business card and asked him to call me with questions or concerns.  Verbalized understanding.

## 2024-01-09 ENCOUNTER — Ambulatory Visit
Admission: RE | Admit: 2024-01-09 | Discharge: 2024-01-09 | Disposition: A | Discharge status: Home / Self Care | Source: Ambulatory Visit | Attending: Radiation Oncology | Admitting: Radiation Oncology

## 2024-01-09 DIAGNOSIS — C61 Malignant neoplasm of prostate: Secondary | ICD-10-CM | POA: Diagnosis present

## 2024-01-09 DIAGNOSIS — Z510 Encounter for antineoplastic radiation therapy: Secondary | ICD-10-CM | POA: Insufficient documentation

## 2024-01-14 NOTE — Progress Notes (Signed)
  Radiation Oncology         [phone] ________________________________  Name: Jeffrey Griffin MRN: [id number]  Date: 01/09/2024  DOB: May 17, 1961  SIMULATION AND TREATMENT PLANNING NOTE    ICD-10-CM   1. Malignant neoplasm of prostate (HCC)  C61       DIAGNOSIS:  63 y.o. gentleman with a rising PSA of 1.89 and oligometastatic disease in a single left obturator lymph node s/p RALP 02/2022 for Stage pT3bN0, Gleason 4+5 prostate cancer  NARRATIVE:  The patient was brought to the CT Simulation planning suite.  Identity was confirmed.  All relevant records and images related to the planned course of therapy were reviewed.  The patient freely provided informed written consent to proceed with treatment after reviewing the details related to the planned course of therapy. The consent form was witnessed and verified by the simulation staff.  Then, the patient was set-up in a stable reproducible supine position for radiation therapy.  A vacuum lock pillow device was custom fabricated to position his legs in a reproducible immobilized position.  Then, I performed a urethrogram under sterile conditions to identify the prostatic bed.  CT images were obtained.  Surface markings were placed.  The CT images were loaded into the planning software.  Then the prostate bed target, pelvic lymph node target and avoidance structures including the rectum, bladder, bowel and hips were contoured.  Treatment planning then occurred.  The radiation prescription was entered and confirmed.  A total of one complex treatment devices were fabricated. I have requested : Intensity Modulated Radiotherapy (IMRT) is medically necessary for this case for the following reason:  Rectal sparing.Aaron Aas  PLAN:  The patient will receive 45 Gy in 25 fractions of 1.8 Gy, followed by a boost to the prostate bed to a total dose of 68.4 Gy with 13 additional fractions of 1.8 Gy and boost the PET avid node to 77.5 Gy with 13 additional fractions of 32.5  Gy   ________________________________  Trilby Fujisawa Lorri Rota, M.D.

## 2024-01-23 DIAGNOSIS — Z510 Encounter for antineoplastic radiation therapy: Secondary | ICD-10-CM | POA: Diagnosis not present

## 2024-01-29 ENCOUNTER — Ambulatory Visit
Admission: RE | Admit: 2024-01-29 | Discharge: 2024-01-29 | Disposition: A | Discharge status: Home / Self Care | Source: Ambulatory Visit | Attending: Radiation Oncology

## 2024-01-29 ENCOUNTER — Other Ambulatory Visit: Payer: Self-pay

## 2024-01-29 DIAGNOSIS — Z510 Encounter for antineoplastic radiation therapy: Secondary | ICD-10-CM | POA: Diagnosis not present

## 2024-01-29 LAB — RAD ONC ARIA SESSION SUMMARY
Course Elapsed Days: 0
Plan Fractions Treated to Date: 1
Plan Prescribed Dose Per Fraction: 1.8 Gy
Plan Total Fractions Prescribed: 25
Plan Total Prescribed Dose: 45 Gy
Reference Point Dosage Given to Date: 1.8 Gy
Reference Point Session Dosage Given: 1.8 Gy
Session Number: 1

## 2024-01-30 ENCOUNTER — Ambulatory Visit
Admission: RE | Admit: 2024-01-30 | Discharge: 2024-01-30 | Disposition: A | Discharge status: Home / Self Care | Source: Ambulatory Visit | Attending: Radiation Oncology | Admitting: Radiation Oncology

## 2024-01-30 ENCOUNTER — Other Ambulatory Visit: Payer: Self-pay

## 2024-01-30 DIAGNOSIS — Z510 Encounter for antineoplastic radiation therapy: Secondary | ICD-10-CM | POA: Diagnosis not present

## 2024-01-30 LAB — RAD ONC ARIA SESSION SUMMARY
Course Elapsed Days: 1
Plan Fractions Treated to Date: 2
Plan Prescribed Dose Per Fraction: 1.8 Gy
Plan Total Fractions Prescribed: 25
Plan Total Prescribed Dose: 45 Gy
Reference Point Dosage Given to Date: 3.6 Gy
Reference Point Session Dosage Given: 1.8 Gy
Session Number: 2

## 2024-01-31 ENCOUNTER — Other Ambulatory Visit: Payer: Self-pay

## 2024-01-31 ENCOUNTER — Ambulatory Visit
Admission: RE | Admit: 2024-01-31 | Discharge: 2024-01-31 | Disposition: A | Discharge status: Home / Self Care | Source: Ambulatory Visit | Attending: Radiation Oncology

## 2024-01-31 ENCOUNTER — Ambulatory Visit

## 2024-01-31 DIAGNOSIS — Z510 Encounter for antineoplastic radiation therapy: Secondary | ICD-10-CM | POA: Diagnosis not present

## 2024-01-31 LAB — RAD ONC ARIA SESSION SUMMARY
Course Elapsed Days: 2
Plan Fractions Treated to Date: 3
Plan Prescribed Dose Per Fraction: 1.8 Gy
Plan Total Fractions Prescribed: 25
Plan Total Prescribed Dose: 45 Gy
Reference Point Dosage Given to Date: 5.4 Gy
Reference Point Session Dosage Given: 1.8 Gy
Session Number: 3

## 2024-02-01 ENCOUNTER — Other Ambulatory Visit: Payer: Self-pay

## 2024-02-01 ENCOUNTER — Ambulatory Visit
Admission: RE | Admit: 2024-02-01 | Discharge: 2024-02-01 | Disposition: A | Discharge status: Home / Self Care | Source: Ambulatory Visit | Attending: Radiation Oncology | Admitting: Radiation Oncology

## 2024-02-01 DIAGNOSIS — Z510 Encounter for antineoplastic radiation therapy: Secondary | ICD-10-CM | POA: Diagnosis not present

## 2024-02-01 LAB — RAD ONC ARIA SESSION SUMMARY
Course Elapsed Days: 3
Plan Fractions Treated to Date: 4
Plan Prescribed Dose Per Fraction: 1.8 Gy
Plan Total Fractions Prescribed: 25
Plan Total Prescribed Dose: 45 Gy
Reference Point Dosage Given to Date: 7.2 Gy
Reference Point Session Dosage Given: 1.8 Gy
Session Number: 4

## 2024-02-04 ENCOUNTER — Ambulatory Visit: Admitting: Podiatry

## 2024-02-04 ENCOUNTER — Encounter: Payer: Self-pay | Admitting: Podiatry

## 2024-02-04 ENCOUNTER — Ambulatory Visit
Admission: RE | Admit: 2024-02-04 | Discharge: 2024-02-04 | Disposition: A | Discharge status: Home / Self Care | Source: Ambulatory Visit | Attending: Radiation Oncology | Admitting: Radiation Oncology

## 2024-02-04 ENCOUNTER — Ambulatory Visit (INDEPENDENT_AMBULATORY_CARE_PROVIDER_SITE_OTHER)

## 2024-02-04 ENCOUNTER — Other Ambulatory Visit: Payer: Self-pay

## 2024-02-04 DIAGNOSIS — R52 Pain, unspecified: Secondary | ICD-10-CM | POA: Diagnosis not present

## 2024-02-04 DIAGNOSIS — Z510 Encounter for antineoplastic radiation therapy: Secondary | ICD-10-CM | POA: Insufficient documentation

## 2024-02-04 DIAGNOSIS — M7752 Other enthesopathy of left foot: Secondary | ICD-10-CM | POA: Diagnosis not present

## 2024-02-04 DIAGNOSIS — C775 Secondary and unspecified malignant neoplasm of intrapelvic lymph nodes: Secondary | ICD-10-CM | POA: Diagnosis present

## 2024-02-04 DIAGNOSIS — M722 Plantar fascial fibromatosis: Secondary | ICD-10-CM | POA: Diagnosis not present

## 2024-02-04 DIAGNOSIS — C61 Malignant neoplasm of prostate: Secondary | ICD-10-CM | POA: Diagnosis present

## 2024-02-04 LAB — RAD ONC ARIA SESSION SUMMARY
Course Elapsed Days: 6
Plan Fractions Treated to Date: 5
Plan Prescribed Dose Per Fraction: 1.8 Gy
Plan Total Fractions Prescribed: 25
Plan Total Prescribed Dose: 45 Gy
Reference Point Dosage Given to Date: 9 Gy
Reference Point Session Dosage Given: 1.8 Gy
Session Number: 5

## 2024-02-04 MED ORDER — TRIAMCINOLONE ACETONIDE 10 MG/ML IJ SUSP
5.0000 mg | Freq: Once | INTRAMUSCULAR | Status: AC
  Administered 2024-02-04: 5 mg via INTRAMUSCULAR

## 2024-02-04 NOTE — Patient Instructions (Signed)
For instructions on how to put on your Plantar Fascial Brace, please visit [email or web address]   Plantar Fasciitis (Heel Spur Syndrome) with Rehab The plantar fascia is a fibrous, ligament-like, soft-tissue structure that spans the bottom of the foot. Plantar fasciitis is a condition that causes pain in the foot due to inflammation of the tissue. SYMPTOMS  Pain and tenderness on the underneath side of the foot. Pain that worsens with standing or walking. CAUSES  Plantar fasciitis is caused by irritation and injury to the plantar fascia on the underneath side of the foot. Common mechanisms of injury include: Direct trauma to bottom of the foot. Damage to a small nerve that runs under the foot where the main fascia attaches to the heel bone. Stress placed on the plantar fascia due to bone spurs. RISK INCREASES WITH:  Activities that place stress on the plantar fascia (running, jumping, pivoting, or cutting). Poor strength and flexibility. Improperly fitted shoes. Tight calf muscles. Flat feet. Failure to warm-up properly before activity. Obesity. PREVENTION Warm up and stretch properly before activity. Allow for adequate recovery between workouts. Maintain physical fitness: Strength, flexibility, and endurance. Cardiovascular fitness. Maintain a health body weight. Avoid stress on the plantar fascia. Wear properly fitted shoes, including arch supports for individuals who have flat feet.  PROGNOSIS  If treated properly, then the symptoms of plantar fasciitis usually resolve without surgery. However, occasionally surgery is necessary.  RELATED COMPLICATIONS  Recurrent symptoms that may result in a chronic condition. Problems of the lower back that are caused by compensating for the injury, such as limping. Pain or weakness of the foot during push-off following surgery. Chronic inflammation, scarring, and partial or complete fascia tear, occurring more often from repeated  injections.  TREATMENT  Treatment initially involves the use of ice and medication to help reduce pain and inflammation. The use of strengthening and stretching exercises may help reduce pain with activity, especially stretches of the Achilles tendon. These exercises may be performed at home or with a therapist. Your caregiver may recommend that you use heel cups of arch supports to help reduce stress on the plantar fascia. Occasionally, corticosteroid injections are given to reduce inflammation. If symptoms persist for greater than 6 months despite non-surgical (conservative), then surgery may be recommended.   MEDICATION  If pain medication is necessary, then nonsteroidal anti-inflammatory medications, such as aspirin and ibuprofen, or other minor pain relievers, such as acetaminophen, are often recommended. Do not take pain medication within 7 days before surgery. Prescription pain relievers may be given if deemed necessary by your caregiver. Use only as directed and only as much as you need. Corticosteroid injections may be given by your caregiver. These injections should be reserved for the most serious cases, because they may only be given a certain number of times.  HEAT AND COLD Cold treatment (icing) relieves pain and reduces inflammation. Cold treatment should be applied for 10 to 15 minutes every 2 to 3 hours for inflammation and pain and immediately after any activity that aggravates your symptoms. Use ice packs or massage the area with a piece of ice (ice massage). Heat treatment may be used prior to performing the stretching and strengthening activities prescribed by your caregiver, physical therapist, or athletic trainer. Use a heat pack or soak the injury in warm water.  SEEK IMMEDIATE MEDICAL CARE IF: Treatment seems to offer no benefit, or the condition worsens. Any medications produce adverse side effects.  EXERCISES- RANGE OF MOTION (ROM) AND STRETCHING EXERCISES - Plantar    Fasciitis (Heel Spur Syndrome) These exercises may help you when beginning to rehabilitate your injury. Your symptoms may resolve with or without further involvement from your physician, physical therapist or athletic trainer. While completing these exercises, remember:  Restoring tissue flexibility helps normal motion to return to the joints. This allows healthier, less painful movement and activity. An effective stretch should be held for at least 30 seconds. A stretch should never be painful. You should only feel a gentle lengthening or release in the stretched tissue.  RANGE OF MOTION - Toe Extension, Flexion Sit with your right / left leg crossed over your opposite knee. Grasp your toes and gently pull them back toward the top of your foot. You should feel a stretch on the bottom of your toes and/or foot. Hold this stretch for 10 seconds. Now, gently pull your toes toward the bottom of your foot. You should feel a stretch on the top of your toes and or foot. Hold this stretch for 10 seconds. Repeat  times. Complete this stretch 3 times per day.   RANGE OF MOTION - Ankle Dorsiflexion, Active Assisted Remove shoes and sit on a chair that is preferably not on a carpeted surface. Place right / left foot under knee. Extend your opposite leg for support. Keeping your heel down, slide your right / left foot back toward the chair until you feel a stretch at your ankle or calf. If you do not feel a stretch, slide your bottom forward to the edge of the chair, while still keeping your heel down. Hold this stretch for 10 seconds. Repeat 3 times. Complete this stretch 2 times per day.   STRETCH  Gastroc, Standing Place hands on wall. Extend right / left leg, keeping the front knee somewhat bent. Slightly point your toes inward on your back foot. Keeping your right / left heel on the floor and your knee straight, shift your weight toward the wall, not allowing your back to arch. You should feel a  gentle stretch in the right / left calf. Hold this position for 10 seconds. Repeat 3 times. Complete this stretch 2 times per day.  STRETCH  Soleus, Standing Place hands on wall. Extend right / left leg, keeping the other knee somewhat bent. Slightly point your toes inward on your back foot. Keep your right / left heel on the floor, bend your back knee, and slightly shift your weight over the back leg so that you feel a gentle stretch deep in your back calf. Hold this position for 10 seconds. Repeat 3 times. Complete this stretch 2 times per day.  STRETCH  Gastrocsoleus, Standing  Note: This exercise can place a lot of stress on your foot and ankle. Please complete this exercise only if specifically instructed by your caregiver.  Place the ball of your right / left foot on a step, keeping your other foot firmly on the same step. Hold on to the wall or a rail for balance. Slowly lift your other foot, allowing your body weight to press your heel down over the edge of the step. You should feel a stretch in your right / left calf. Hold this position for 10 seconds. Repeat this exercise with a slight bend in your right / left knee. Repeat 3 times. Complete this stretch 2 times per day.   STRENGTHENING EXERCISES - Plantar Fasciitis (Heel Spur Syndrome)  These exercises may help you when beginning to rehabilitate your injury. They may resolve your symptoms with or without further involvement   from your physician, physical therapist or athletic trainer. While completing these exercises, remember:  Muscles can gain both the endurance and the strength needed for everyday activities through controlled exercises. Complete these exercises as instructed by your physician, physical therapist or athletic trainer. Progress the resistance and repetitions only as guided.  STRENGTH - Towel Curls Sit in a chair positioned on a non-carpeted surface. Place your foot on a towel, keeping your heel on the  floor. Pull the towel toward your heel by only curling your toes. Keep your heel on the floor. Repeat 3 times. Complete this exercise 2 times per day.  STRENGTH - Ankle Inversion Secure one end of a rubber exercise band/tubing to a fixed object (table, pole). Loop the other end around your foot just before your toes. Place your fists between your knees. This will focus your strengthening at your ankle. Slowly, pull your big toe up and in, making sure the band/tubing is positioned to resist the entire motion. Hold this position for 10 seconds. Have your muscles resist the band/tubing as it slowly pulls your foot back to the starting position. Repeat 3 times. Complete this exercises 2 times per day.  Document Released: 08/21/2005 Document Revised: 11/13/2011 Document Reviewed: 12/03/2008 [employer] Patient Information 2014 [employer].  While at your visit today you received a steroid injection in your foot or ankle to help with your pain. Along with having the steroid medication there is some "numbing" medication in the shot that you received. Due to this you may notice some numbness to the area for the next couple of hours.   I would recommend limiting activity for the next few days to help the steroid injection take affect.    The actually benefit from the steroid injection may take up to 2-7 days to see a difference. You may actually experience a small (as in 10%) INCREASE in pain in the first 24 hours---that is common. It would be best if you can ice the area today and take anti-inflammatory medications (such as Ibuprofen, Motrin, or Aleve) if you are able to take these medications. If you were prescribed another medication to help with the pain go ahead and start that medication today    Things to watch out for that you should contact us or a health care provider urgently would include: 1. Unusual (as in more than 10%) increase in pain 2. New fever > 101.5 3. New swelling or redness of  the injected area.  4. Streaking of red lines around the area injected.  If you have any questions or concerns about this, please give our office a call at [phone].    

## 2024-02-05 ENCOUNTER — Ambulatory Visit
Admission: RE | Admit: 2024-02-05 | Discharge: 2024-02-05 | Disposition: A | Discharge status: Home / Self Care | Source: Ambulatory Visit | Attending: Radiation Oncology | Admitting: Radiation Oncology

## 2024-02-05 ENCOUNTER — Other Ambulatory Visit: Payer: Self-pay

## 2024-02-05 DIAGNOSIS — Z510 Encounter for antineoplastic radiation therapy: Secondary | ICD-10-CM | POA: Diagnosis not present

## 2024-02-05 LAB — RAD ONC ARIA SESSION SUMMARY
Course Elapsed Days: 7
Plan Fractions Treated to Date: 6
Plan Prescribed Dose Per Fraction: 1.8 Gy
Plan Total Fractions Prescribed: 25
Plan Total Prescribed Dose: 45 Gy
Reference Point Dosage Given to Date: 10.8 Gy
Reference Point Session Dosage Given: 1.8 Gy
Session Number: 6

## 2024-02-05 NOTE — Progress Notes (Signed)
  Subjective:  Patient ID: Jeffrey Griffin, male    DOB: [birth year],  MRN: [id number]  Chief Complaint  Patient presents with   Foot Pain    RM#11 Left heel pain ongoing for may years.    Discussed the use of AI scribe software for clinical note transcription with the patient, who gave verbal consent to proceed.  History of Present Illness Jeffrey Griffin is a 63 year old male who presents with left heel pain, which is a new concern.   He has experienced left heel pain for one month without specific injury. The pain is most severe when standing and particularly intense in the morning upon first weight-bearing. He uses a stretching device at home, though not consistently.  He does not report any recent injury.  No swelling or any numbness or tingling.  He recalls a similar heel pain issue eighteen years ago, though he cannot remember which foot was affected. He used a stretching device then, discontinuing it during pain-free periods.      Objective:    Physical Exam General: AAO x3, NAD  Dermatological: Skin is warm, dry and supple bilateral.  There are no open sores, no preulcerative lesions, no rash or signs of infection present.  Vascular: Dorsalis Pedis artery and Posterior Tibial artery pedal pulses are 2/4 bilateral with immedate capillary fill time. There is no pain with calf compression, swelling, warmth, erythema.   Neruologic: Grossly intact via light touch bilateral.  Negative Tinel's sign.  Musculoskeletal: There is tenderness palpation of the plantar medial as well as plantar central aspect of the left heel.  There is no pain with lateral compression of the calcaneus.  There is no pain on the Achilles tendon.  No edema, erythema.  MMT 5/5.  Gait: Unassisted, Nonantalgic.         Results   RADIOLOGY Heel X-ray: Heel spur present, no evidence of acute fracture.  (02/04/2024)   Assessment:   1. Plantar fasciitis   2. New complaint of pain      Plan:  Patient  was evaluated and treated and all questions answered.  Assessment and Plan Assessment & Plan Plantar fasciitis with heel spur Chronic left heel pain with confirmed plantar fasciitis and heel spur. No fracture or acute injury. Avoided oral anti-inflammatories due to medications, comorbidities. - Administer steroid injection to heel.  See procedure note below. - Advise daily icing with ice pack or frozen water bottle. - Prescribe Voltaren  gel for topical anti-inflammatory. - Provide stretching exercises for calf, Achilles tendon, and plantar fascia. - Recommend plantar fascial brace during day to help support stabilize the plantar fascia to help facilitate healing. - Provide night splint for dorsiflexion during rest. Static or dynamic ankle foot orthosis, including soft interface material, adjustable for fit, for positioning, may be used for minimal ambulation, prefabricated, off-the-shelf was dispensed on the billed date of service  - Advise wearing shoes with arch support, avoid barefoot. - Instruct to report if no improvement in 3-4 weeks or worsening.   No follow-ups on file.   Charity Conch DPM

## 2024-02-06 ENCOUNTER — Ambulatory Visit
Admission: RE | Admit: 2024-02-06 | Discharge: 2024-02-06 | Disposition: A | Discharge status: Home / Self Care | Source: Ambulatory Visit | Attending: Radiation Oncology | Admitting: Radiation Oncology

## 2024-02-06 ENCOUNTER — Other Ambulatory Visit: Payer: Self-pay

## 2024-02-06 DIAGNOSIS — Z510 Encounter for antineoplastic radiation therapy: Secondary | ICD-10-CM | POA: Diagnosis not present

## 2024-02-06 LAB — RAD ONC ARIA SESSION SUMMARY
Course Elapsed Days: 8
Plan Fractions Treated to Date: 7
Plan Prescribed Dose Per Fraction: 1.8 Gy
Plan Total Fractions Prescribed: 25
Plan Total Prescribed Dose: 45 Gy
Reference Point Dosage Given to Date: 12.6 Gy
Reference Point Session Dosage Given: 1.8 Gy
Session Number: 7

## 2024-02-07 ENCOUNTER — Ambulatory Visit
Admission: RE | Admit: 2024-02-07 | Discharge: 2024-02-07 | Disposition: A | Discharge status: Home / Self Care | Source: Ambulatory Visit | Attending: Radiation Oncology | Admitting: Radiation Oncology

## 2024-02-07 ENCOUNTER — Other Ambulatory Visit: Payer: Self-pay

## 2024-02-07 DIAGNOSIS — Z510 Encounter for antineoplastic radiation therapy: Secondary | ICD-10-CM | POA: Diagnosis not present

## 2024-02-07 LAB — RAD ONC ARIA SESSION SUMMARY
Course Elapsed Days: 9
Plan Fractions Treated to Date: 8
Plan Prescribed Dose Per Fraction: 1.8 Gy
Plan Total Fractions Prescribed: 25
Plan Total Prescribed Dose: 45 Gy
Reference Point Dosage Given to Date: 14.4 Gy
Reference Point Session Dosage Given: 1.8 Gy
Session Number: 8

## 2024-02-08 ENCOUNTER — Ambulatory Visit
Admission: RE | Admit: 2024-02-08 | Discharge: 2024-02-08 | Disposition: A | Discharge status: Home / Self Care | Source: Ambulatory Visit | Attending: Radiation Oncology | Admitting: Radiation Oncology

## 2024-02-08 ENCOUNTER — Other Ambulatory Visit: Payer: Self-pay

## 2024-02-08 DIAGNOSIS — Z510 Encounter for antineoplastic radiation therapy: Secondary | ICD-10-CM | POA: Diagnosis not present

## 2024-02-08 LAB — RAD ONC ARIA SESSION SUMMARY
Course Elapsed Days: 10
Plan Fractions Treated to Date: 9
Plan Prescribed Dose Per Fraction: 1.8 Gy
Plan Total Fractions Prescribed: 25
Plan Total Prescribed Dose: 45 Gy
Reference Point Dosage Given to Date: 16.2 Gy
Reference Point Session Dosage Given: 1.8 Gy
Session Number: 9

## 2024-02-11 ENCOUNTER — Other Ambulatory Visit: Payer: Self-pay

## 2024-02-11 ENCOUNTER — Ambulatory Visit
Admission: RE | Admit: 2024-02-11 | Discharge: 2024-02-11 | Disposition: A | Discharge status: Home / Self Care | Source: Ambulatory Visit | Attending: Radiation Oncology

## 2024-02-11 DIAGNOSIS — Z510 Encounter for antineoplastic radiation therapy: Secondary | ICD-10-CM | POA: Diagnosis not present

## 2024-02-11 LAB — RAD ONC ARIA SESSION SUMMARY
Course Elapsed Days: 13
Plan Fractions Treated to Date: 10
Plan Prescribed Dose Per Fraction: 1.8 Gy
Plan Total Fractions Prescribed: 25
Plan Total Prescribed Dose: 45 Gy
Reference Point Dosage Given to Date: 18 Gy
Reference Point Session Dosage Given: 1.8 Gy
Session Number: 10

## 2024-02-12 ENCOUNTER — Other Ambulatory Visit: Payer: Self-pay

## 2024-02-12 ENCOUNTER — Ambulatory Visit
Admission: RE | Admit: 2024-02-12 | Discharge: 2024-02-12 | Disposition: A | Discharge status: Home / Self Care | Source: Ambulatory Visit | Attending: Radiation Oncology | Admitting: Radiation Oncology

## 2024-02-12 DIAGNOSIS — Z510 Encounter for antineoplastic radiation therapy: Secondary | ICD-10-CM | POA: Diagnosis not present

## 2024-02-12 LAB — RAD ONC ARIA SESSION SUMMARY
Course Elapsed Days: 14
Plan Fractions Treated to Date: 11
Plan Prescribed Dose Per Fraction: 1.8 Gy
Plan Total Fractions Prescribed: 25
Plan Total Prescribed Dose: 45 Gy
Reference Point Dosage Given to Date: 19.8 Gy
Reference Point Session Dosage Given: 1.8 Gy
Session Number: 11

## 2024-02-13 ENCOUNTER — Ambulatory Visit
Admission: RE | Admit: 2024-02-13 | Discharge: 2024-02-13 | Disposition: A | Discharge status: Home / Self Care | Source: Ambulatory Visit | Attending: Radiation Oncology | Admitting: Radiation Oncology

## 2024-02-13 ENCOUNTER — Other Ambulatory Visit: Payer: Self-pay

## 2024-02-13 DIAGNOSIS — Z510 Encounter for antineoplastic radiation therapy: Secondary | ICD-10-CM | POA: Diagnosis not present

## 2024-02-13 LAB — RAD ONC ARIA SESSION SUMMARY
Course Elapsed Days: 15
Plan Fractions Treated to Date: 12
Plan Prescribed Dose Per Fraction: 1.8 Gy
Plan Total Fractions Prescribed: 25
Plan Total Prescribed Dose: 45 Gy
Reference Point Dosage Given to Date: 21.6 Gy
Reference Point Session Dosage Given: 1.8 Gy
Session Number: 12

## 2024-02-14 ENCOUNTER — Ambulatory Visit
Admission: RE | Admit: 2024-02-14 | Discharge: 2024-02-14 | Disposition: A | Discharge status: Home / Self Care | Source: Ambulatory Visit | Attending: Radiation Oncology | Admitting: Radiation Oncology

## 2024-02-14 ENCOUNTER — Other Ambulatory Visit: Payer: Self-pay

## 2024-02-14 DIAGNOSIS — Z510 Encounter for antineoplastic radiation therapy: Secondary | ICD-10-CM | POA: Diagnosis not present

## 2024-02-14 LAB — RAD ONC ARIA SESSION SUMMARY
Course Elapsed Days: 16
Plan Fractions Treated to Date: 13
Plan Prescribed Dose Per Fraction: 1.8 Gy
Plan Total Fractions Prescribed: 25
Plan Total Prescribed Dose: 45 Gy
Reference Point Dosage Given to Date: 23.4 Gy
Reference Point Session Dosage Given: 1.8 Gy
Session Number: 13

## 2024-02-15 ENCOUNTER — Other Ambulatory Visit: Payer: Self-pay

## 2024-02-15 ENCOUNTER — Ambulatory Visit
Admission: RE | Admit: 2024-02-15 | Discharge: 2024-02-15 | Disposition: A | Discharge status: Home / Self Care | Source: Ambulatory Visit | Attending: Radiation Oncology | Admitting: Radiation Oncology

## 2024-02-15 DIAGNOSIS — Z510 Encounter for antineoplastic radiation therapy: Secondary | ICD-10-CM | POA: Diagnosis not present

## 2024-02-15 LAB — RAD ONC ARIA SESSION SUMMARY
Course Elapsed Days: 17
Plan Fractions Treated to Date: 14
Plan Prescribed Dose Per Fraction: 1.8 Gy
Plan Total Fractions Prescribed: 25
Plan Total Prescribed Dose: 45 Gy
Reference Point Dosage Given to Date: 25.2 Gy
Reference Point Session Dosage Given: 1.8 Gy
Session Number: 14

## 2024-02-18 ENCOUNTER — Other Ambulatory Visit: Payer: Self-pay

## 2024-02-18 ENCOUNTER — Ambulatory Visit
Admission: RE | Admit: 2024-02-18 | Discharge: 2024-02-18 | Disposition: A | Discharge status: Home / Self Care | Source: Ambulatory Visit | Attending: Radiation Oncology | Admitting: Radiation Oncology

## 2024-02-18 DIAGNOSIS — Z510 Encounter for antineoplastic radiation therapy: Secondary | ICD-10-CM | POA: Diagnosis not present

## 2024-02-18 LAB — RAD ONC ARIA SESSION SUMMARY
Course Elapsed Days: 20
Plan Fractions Treated to Date: 15
Plan Prescribed Dose Per Fraction: 1.8 Gy
Plan Total Fractions Prescribed: 25
Plan Total Prescribed Dose: 45 Gy
Reference Point Dosage Given to Date: 27 Gy
Reference Point Session Dosage Given: 1.8 Gy
Session Number: 15

## 2024-02-19 ENCOUNTER — Other Ambulatory Visit: Payer: Self-pay

## 2024-02-19 ENCOUNTER — Ambulatory Visit
Admission: RE | Admit: 2024-02-19 | Discharge: 2024-02-19 | Disposition: A | Discharge status: Home / Self Care | Source: Ambulatory Visit | Attending: Radiation Oncology | Admitting: Radiation Oncology

## 2024-02-19 DIAGNOSIS — Z510 Encounter for antineoplastic radiation therapy: Secondary | ICD-10-CM | POA: Diagnosis not present

## 2024-02-19 LAB — RAD ONC ARIA SESSION SUMMARY
Course Elapsed Days: 21
Plan Fractions Treated to Date: 16
Plan Prescribed Dose Per Fraction: 1.8 Gy
Plan Total Fractions Prescribed: 25
Plan Total Prescribed Dose: 45 Gy
Reference Point Dosage Given to Date: 28.8 Gy
Reference Point Session Dosage Given: 1.8 Gy
Session Number: 16

## 2024-02-20 ENCOUNTER — Other Ambulatory Visit: Payer: Self-pay

## 2024-02-20 ENCOUNTER — Ambulatory Visit: Admitting: Cardiology

## 2024-02-20 ENCOUNTER — Ambulatory Visit
Admission: RE | Admit: 2024-02-20 | Discharge: 2024-02-20 | Disposition: A | Discharge status: Home / Self Care | Source: Ambulatory Visit | Attending: Radiation Oncology

## 2024-02-20 DIAGNOSIS — Z510 Encounter for antineoplastic radiation therapy: Secondary | ICD-10-CM | POA: Diagnosis not present

## 2024-02-20 LAB — RAD ONC ARIA SESSION SUMMARY
Course Elapsed Days: 22
Plan Fractions Treated to Date: 17
Plan Prescribed Dose Per Fraction: 1.8 Gy
Plan Total Fractions Prescribed: 25
Plan Total Prescribed Dose: 45 Gy
Reference Point Dosage Given to Date: 30.6 Gy
Reference Point Session Dosage Given: 1.8 Gy
Session Number: 17

## 2024-02-21 ENCOUNTER — Other Ambulatory Visit: Payer: Self-pay

## 2024-02-21 ENCOUNTER — Ambulatory Visit
Admission: RE | Admit: 2024-02-21 | Discharge: 2024-02-21 | Disposition: A | Discharge status: Home / Self Care | Source: Ambulatory Visit | Attending: Radiation Oncology

## 2024-02-21 DIAGNOSIS — Z510 Encounter for antineoplastic radiation therapy: Secondary | ICD-10-CM | POA: Diagnosis not present

## 2024-02-21 LAB — RAD ONC ARIA SESSION SUMMARY
Course Elapsed Days: 23
Plan Fractions Treated to Date: 18
Plan Prescribed Dose Per Fraction: 1.8 Gy
Plan Total Fractions Prescribed: 25
Plan Total Prescribed Dose: 45 Gy
Reference Point Dosage Given to Date: 32.4 Gy
Reference Point Session Dosage Given: 1.8 Gy
Session Number: 18

## 2024-02-22 ENCOUNTER — Other Ambulatory Visit: Payer: Self-pay

## 2024-02-22 ENCOUNTER — Ambulatory Visit
Admission: RE | Admit: 2024-02-22 | Discharge: 2024-02-22 | Disposition: A | Discharge status: Home / Self Care | Source: Ambulatory Visit | Attending: Radiation Oncology | Admitting: Radiation Oncology

## 2024-02-22 DIAGNOSIS — Z510 Encounter for antineoplastic radiation therapy: Secondary | ICD-10-CM | POA: Diagnosis not present

## 2024-02-22 LAB — RAD ONC ARIA SESSION SUMMARY
Course Elapsed Days: 24
Plan Fractions Treated to Date: 19
Plan Prescribed Dose Per Fraction: 1.8 Gy
Plan Total Fractions Prescribed: 25
Plan Total Prescribed Dose: 45 Gy
Reference Point Dosage Given to Date: 34.2 Gy
Reference Point Session Dosage Given: 1.8 Gy
Session Number: 19

## 2024-02-25 ENCOUNTER — Other Ambulatory Visit: Payer: Self-pay

## 2024-02-25 ENCOUNTER — Ambulatory Visit
Admission: RE | Admit: 2024-02-25 | Discharge: 2024-02-25 | Disposition: A | Discharge status: Home / Self Care | Source: Ambulatory Visit | Attending: Radiation Oncology | Admitting: Radiation Oncology

## 2024-02-25 DIAGNOSIS — Z510 Encounter for antineoplastic radiation therapy: Secondary | ICD-10-CM | POA: Diagnosis not present

## 2024-02-25 LAB — RAD ONC ARIA SESSION SUMMARY
Course Elapsed Days: 27
Plan Fractions Treated to Date: 20
Plan Prescribed Dose Per Fraction: 1.8 Gy
Plan Total Fractions Prescribed: 25
Plan Total Prescribed Dose: 45 Gy
Reference Point Dosage Given to Date: 36 Gy
Reference Point Session Dosage Given: 1.8 Gy
Session Number: 20

## 2024-02-26 ENCOUNTER — Other Ambulatory Visit: Payer: Self-pay

## 2024-02-26 ENCOUNTER — Ambulatory Visit: Admitting: Cardiology

## 2024-02-26 ENCOUNTER — Ambulatory Visit
Admission: RE | Admit: 2024-02-26 | Discharge: 2024-02-26 | Disposition: A | Discharge status: Home / Self Care | Source: Ambulatory Visit | Attending: Radiation Oncology | Admitting: Radiation Oncology

## 2024-02-26 DIAGNOSIS — Z510 Encounter for antineoplastic radiation therapy: Secondary | ICD-10-CM | POA: Diagnosis not present

## 2024-02-26 LAB — RAD ONC ARIA SESSION SUMMARY
Course Elapsed Days: 28
Plan Fractions Treated to Date: 21
Plan Prescribed Dose Per Fraction: 1.8 Gy
Plan Total Fractions Prescribed: 25
Plan Total Prescribed Dose: 45 Gy
Reference Point Dosage Given to Date: 37.8 Gy
Reference Point Session Dosage Given: 1.8 Gy
Session Number: 21

## 2024-02-27 ENCOUNTER — Other Ambulatory Visit: Payer: Self-pay

## 2024-02-27 ENCOUNTER — Ambulatory Visit
Admission: RE | Admit: 2024-02-27 | Discharge: 2024-02-27 | Disposition: A | Discharge status: Home / Self Care | Source: Ambulatory Visit | Attending: Radiation Oncology

## 2024-02-27 DIAGNOSIS — Z510 Encounter for antineoplastic radiation therapy: Secondary | ICD-10-CM | POA: Diagnosis not present

## 2024-02-27 LAB — RAD ONC ARIA SESSION SUMMARY
Course Elapsed Days: 29
Plan Fractions Treated to Date: 22
Plan Prescribed Dose Per Fraction: 1.8 Gy
Plan Total Fractions Prescribed: 25
Plan Total Prescribed Dose: 45 Gy
Reference Point Dosage Given to Date: 39.6 Gy
Reference Point Session Dosage Given: 1.8 Gy
Session Number: 22

## 2024-02-28 ENCOUNTER — Ambulatory Visit
Admission: RE | Admit: 2024-02-28 | Discharge: 2024-02-28 | Disposition: A | Discharge status: Home / Self Care | Source: Ambulatory Visit | Attending: Radiation Oncology | Admitting: Radiation Oncology

## 2024-02-28 ENCOUNTER — Other Ambulatory Visit: Payer: Self-pay

## 2024-02-28 DIAGNOSIS — Z510 Encounter for antineoplastic radiation therapy: Secondary | ICD-10-CM | POA: Diagnosis not present

## 2024-02-28 LAB — RAD ONC ARIA SESSION SUMMARY
Course Elapsed Days: 30
Plan Fractions Treated to Date: 23
Plan Prescribed Dose Per Fraction: 1.8 Gy
Plan Total Fractions Prescribed: 25
Plan Total Prescribed Dose: 45 Gy
Reference Point Dosage Given to Date: 41.4 Gy
Reference Point Session Dosage Given: 1.8 Gy
Session Number: 23

## 2024-02-29 ENCOUNTER — Ambulatory Visit
Admission: RE | Admit: 2024-02-29 | Discharge: 2024-02-29 | Disposition: A | Discharge status: Home / Self Care | Source: Ambulatory Visit | Attending: Radiation Oncology

## 2024-02-29 ENCOUNTER — Other Ambulatory Visit: Payer: Self-pay

## 2024-02-29 ENCOUNTER — Ambulatory Visit

## 2024-02-29 DIAGNOSIS — Z510 Encounter for antineoplastic radiation therapy: Secondary | ICD-10-CM | POA: Diagnosis not present

## 2024-02-29 LAB — RAD ONC ARIA SESSION SUMMARY
Course Elapsed Days: 31
Plan Fractions Treated to Date: 24
Plan Prescribed Dose Per Fraction: 1.8 Gy
Plan Total Fractions Prescribed: 25
Plan Total Prescribed Dose: 45 Gy
Reference Point Dosage Given to Date: 43.2 Gy
Reference Point Session Dosage Given: 1.8 Gy
Session Number: 24

## 2024-03-03 ENCOUNTER — Ambulatory Visit

## 2024-03-03 ENCOUNTER — Ambulatory Visit
Admission: RE | Admit: 2024-03-03 | Discharge: 2024-03-03 | Disposition: A | Discharge status: Home / Self Care | Source: Ambulatory Visit | Attending: Radiation Oncology | Admitting: Radiation Oncology

## 2024-03-03 ENCOUNTER — Other Ambulatory Visit: Payer: Self-pay

## 2024-03-03 DIAGNOSIS — Z510 Encounter for antineoplastic radiation therapy: Secondary | ICD-10-CM | POA: Diagnosis not present

## 2024-03-03 LAB — RAD ONC ARIA SESSION SUMMARY
Course Elapsed Days: 34
Plan Fractions Treated to Date: 25
Plan Prescribed Dose Per Fraction: 1.8 Gy
Plan Total Fractions Prescribed: 25
Plan Total Prescribed Dose: 45 Gy
Reference Point Dosage Given to Date: 45 Gy
Reference Point Session Dosage Given: 1.8 Gy
Session Number: 25

## 2024-03-04 ENCOUNTER — Other Ambulatory Visit: Payer: Self-pay

## 2024-03-04 ENCOUNTER — Ambulatory Visit
Admission: RE | Admit: 2024-03-04 | Discharge: 2024-03-04 | Disposition: A | Discharge status: Home / Self Care | Source: Ambulatory Visit | Attending: Radiation Oncology | Admitting: Radiation Oncology

## 2024-03-04 DIAGNOSIS — C61 Malignant neoplasm of prostate: Secondary | ICD-10-CM | POA: Insufficient documentation

## 2024-03-04 DIAGNOSIS — Z510 Encounter for antineoplastic radiation therapy: Secondary | ICD-10-CM | POA: Diagnosis present

## 2024-03-04 DIAGNOSIS — C775 Secondary and unspecified malignant neoplasm of intrapelvic lymph nodes: Secondary | ICD-10-CM | POA: Insufficient documentation

## 2024-03-04 LAB — RAD ONC ARIA SESSION SUMMARY
Course Elapsed Days: 35
Plan Fractions Treated to Date: 1
Plan Prescribed Dose Per Fraction: 2.5 Gy
Plan Total Fractions Prescribed: 13
Plan Total Prescribed Dose: 32.5 Gy
Reference Point Dosage Given to Date: 2.5 Gy
Reference Point Session Dosage Given: 2.5 Gy
Session Number: 26

## 2024-03-05 ENCOUNTER — Ambulatory Visit
Admission: RE | Admit: 2024-03-05 | Discharge: 2024-03-05 | Disposition: A | Discharge status: Home / Self Care | Source: Ambulatory Visit | Attending: Radiation Oncology | Admitting: Radiation Oncology

## 2024-03-05 ENCOUNTER — Other Ambulatory Visit: Payer: Self-pay

## 2024-03-05 DIAGNOSIS — Z510 Encounter for antineoplastic radiation therapy: Secondary | ICD-10-CM | POA: Diagnosis not present

## 2024-03-05 LAB — RAD ONC ARIA SESSION SUMMARY
Course Elapsed Days: 36
Plan Fractions Treated to Date: 2
Plan Prescribed Dose Per Fraction: 2.5 Gy
Plan Total Fractions Prescribed: 13
Plan Total Prescribed Dose: 32.5 Gy
Reference Point Dosage Given to Date: 5 Gy
Reference Point Session Dosage Given: 2.5 Gy
Session Number: 27

## 2024-03-06 ENCOUNTER — Other Ambulatory Visit: Payer: Self-pay

## 2024-03-06 ENCOUNTER — Ambulatory Visit
Admission: RE | Admit: 2024-03-06 | Discharge: 2024-03-06 | Disposition: A | Discharge status: Home / Self Care | Source: Ambulatory Visit | Attending: Radiation Oncology | Admitting: Radiation Oncology

## 2024-03-06 DIAGNOSIS — Z510 Encounter for antineoplastic radiation therapy: Secondary | ICD-10-CM | POA: Diagnosis not present

## 2024-03-06 LAB — RAD ONC ARIA SESSION SUMMARY
Course Elapsed Days: 37
Plan Fractions Treated to Date: 3
Plan Prescribed Dose Per Fraction: 2.5 Gy
Plan Total Fractions Prescribed: 13
Plan Total Prescribed Dose: 32.5 Gy
Reference Point Dosage Given to Date: 7.5 Gy
Reference Point Session Dosage Given: 2.5 Gy
Session Number: 28

## 2024-03-10 ENCOUNTER — Ambulatory Visit
Admission: RE | Admit: 2024-03-10 | Discharge: 2024-03-10 | Disposition: A | Discharge status: Home / Self Care | Source: Ambulatory Visit | Attending: Radiation Oncology

## 2024-03-10 ENCOUNTER — Other Ambulatory Visit: Payer: Self-pay

## 2024-03-10 DIAGNOSIS — Z510 Encounter for antineoplastic radiation therapy: Secondary | ICD-10-CM | POA: Diagnosis not present

## 2024-03-10 LAB — RAD ONC ARIA SESSION SUMMARY
Course Elapsed Days: 41
Plan Fractions Treated to Date: 4
Plan Prescribed Dose Per Fraction: 2.5 Gy
Plan Total Fractions Prescribed: 13
Plan Total Prescribed Dose: 32.5 Gy
Reference Point Dosage Given to Date: 10 Gy
Reference Point Session Dosage Given: 2.5 Gy
Session Number: 29

## 2024-03-11 ENCOUNTER — Other Ambulatory Visit: Payer: Self-pay

## 2024-03-11 ENCOUNTER — Ambulatory Visit
Admission: RE | Admit: 2024-03-11 | Discharge: 2024-03-11 | Disposition: A | Discharge status: Home / Self Care | Source: Ambulatory Visit | Attending: Radiation Oncology

## 2024-03-11 DIAGNOSIS — Z510 Encounter for antineoplastic radiation therapy: Secondary | ICD-10-CM | POA: Diagnosis not present

## 2024-03-11 LAB — RAD ONC ARIA SESSION SUMMARY
Course Elapsed Days: 42
Plan Fractions Treated to Date: 5
Plan Prescribed Dose Per Fraction: 2.5 Gy
Plan Total Fractions Prescribed: 13
Plan Total Prescribed Dose: 32.5 Gy
Reference Point Dosage Given to Date: 12.5 Gy
Reference Point Session Dosage Given: 2.5 Gy
Session Number: 30

## 2024-03-11 NOTE — Progress Notes (Unsigned)
 Electrophysiology Office Note:   Date:  03/12/2024  ID:  Jeffrey Griffin, DOB 01-25-1961, MRN [id number]  Primary Cardiologist: Aleene Passe, MD Primary Heart Failure: None Electrophysiologist: Will Gladis Norton, MD      History of Present Illness:   Jeffrey Griffin is a 63 y.o. male with h/o AF s/p ablation, HTN, HFrecEF, thoracic aortic aneurysm, OSA seen today for routine electrophysiology followup.   Since last being seen in our clinic the patient reports he has been doing well.  He has not had any evidence of AF since his ablation in 2021.  He has a PRN lopressor  Rx but has never needed to use it. He is undergoing treatment for prostate cancer.     He denies chest pain, palpitations, dyspnea, PND, orthopnea, nausea, vomiting, dizziness, syncope, edema, weight gain, or early satiety.   Review of systems complete and found to be negative unless listed in HPI.   EP Information / Studies Reviewed:    EKG is ordered today. Personal review as below.  EKG Interpretation Date/Time:  Wednesday March 12 2024 07:57:48 EDT Ventricular Rate:  63 PR Interval:  190 QRS Duration:  108 QT Interval:  398 QTC Calculation: 407 R Axis:   12  Text Interpretation: Normal sinus rhythm Confirmed by Aniceto Jarvis ([id number]) on 03/12/2024 8:00:57 AM   Studies:  EPS 07/2020 > ([facility]), AF on presentation, PVI ablation with RF current, ablation of posterior LA, CTI ablation  ECHO 03/2022 > LVEF 55-60%, mod LVH   Arrhythmia / AAD AF    Risk Assessment/Calculations:    CHA2DS2-VASc Score = 3   This indicates a 3.2% annual risk of stroke. The patient's score is based upon: CHF History: 1 HTN History: 1 Diabetes History: 0 Stroke History: 0 Vascular Disease History: 1 Age Score: 0 Gender Score: 0         Physical Exam:   VS:  BP (!) 141/89   Pulse 63   Ht 5' 9 (1.753 m)   Wt 254 lb (115.2 kg)   SpO2 95%   BMI 37.51 kg/m    Wt Readings from Last 3 Encounters:  03/12/24 254 lb (115.2 kg)   01/03/24 250 lb (113.4 kg)  12/13/23 250 lb 4 oz (113.5 kg)     GEN: Well nourished, well developed in no acute distress NECK: No JVD; No carotid bruits CARDIAC: Regular rate and rhythm, no murmurs, rubs, gallops RESPIRATORY:  Clear to auscultation without rales, wheezing or rhonchi  ABDOMEN: Soft, non-tender, non-distended EXTREMITIES:  No edema; No deformity   ASSESSMENT AND PLAN:    Paroxysmal AF  CHA2DS2-VASc 3, s/p ablation 07/2020  -OAC for stroke prophylaxis  -Toprol  50 mg daily  -monitors with a home EKG monitor as needed  -PRN lopressor  25 mg Q6, up to 2 doses, for breakthrough tachycardia  -reviewed modifiable risk factors for AF > encouraged exercise, low or no ETOH, treatment of OSA, 10% weight reduction  Secondary Hypercoagulable State  -continue Eliquis  5mg  BID, dose reviewed and appropriate by age / wt   Hypertension  -mildly elevated in clinic, he reports it is always elevated in clinic but runs 130's / 70's at home  HFrecEF  -GDMT per Cardiology / followed with Dr. Calhoun   OSA  -CPAP compliant  Prostate Cancer  -follows with Urology   Follow up with Dr. Norton or EP APP in 12 months > pt needs 0800 appt due to work   Signed, Jarvis Aniceto, NP-C, AGACNP-BC [facility] - Electrophysiology  03/12/2024,  8:25 AM

## 2024-03-12 ENCOUNTER — Ambulatory Visit
Admission: RE | Admit: 2024-03-12 | Discharge: 2024-03-12 | Disposition: A | Discharge status: Home / Self Care | Source: Ambulatory Visit | Attending: Radiation Oncology

## 2024-03-12 ENCOUNTER — Ambulatory Visit: Attending: Pulmonary Disease | Admitting: Pulmonary Disease

## 2024-03-12 ENCOUNTER — Encounter: Payer: Self-pay | Admitting: Pulmonary Disease

## 2024-03-12 ENCOUNTER — Other Ambulatory Visit: Payer: Self-pay

## 2024-03-12 VITALS — BP 141/89 | HR 63 | Ht 69.0 in | Wt 254.0 lb

## 2024-03-12 DIAGNOSIS — D6869 Other thrombophilia: Secondary | ICD-10-CM | POA: Diagnosis not present

## 2024-03-12 DIAGNOSIS — I5042 Chronic combined systolic (congestive) and diastolic (congestive) heart failure: Secondary | ICD-10-CM | POA: Diagnosis not present

## 2024-03-12 DIAGNOSIS — I10 Essential (primary) hypertension: Secondary | ICD-10-CM | POA: Diagnosis not present

## 2024-03-12 DIAGNOSIS — Z510 Encounter for antineoplastic radiation therapy: Secondary | ICD-10-CM | POA: Diagnosis not present

## 2024-03-12 DIAGNOSIS — I480 Paroxysmal atrial fibrillation: Secondary | ICD-10-CM | POA: Diagnosis not present

## 2024-03-12 LAB — RAD ONC ARIA SESSION SUMMARY
Course Elapsed Days: 43
Plan Fractions Treated to Date: 6
Plan Prescribed Dose Per Fraction: 2.5 Gy
Plan Total Fractions Prescribed: 13
Plan Total Prescribed Dose: 32.5 Gy
Reference Point Dosage Given to Date: 15 Gy
Reference Point Session Dosage Given: 2.5 Gy
Session Number: 31

## 2024-03-12 MED ORDER — METOPROLOL SUCCINATE ER 50 MG PO TB24: 50.0000 mg | ORAL_TABLET | Freq: Every day | ORAL | 3 refills | Status: DC

## 2024-03-12 MED ORDER — APIXABAN 5 MG PO TABS
5.0000 mg | ORAL_TABLET | Freq: Two times a day (BID) | ORAL | 3 refills | Status: AC
Start: 2024-03-12 — End: ?

## 2024-03-12 MED ORDER — METOPROLOL TARTRATE 25 MG PO TABS: ORAL_TABLET | ORAL | 6 refills | Status: AC

## 2024-03-12 NOTE — Patient Instructions (Signed)
 Medication Instructions:  Your physician recommends that you continue on your current medications as directed. Please refer to the Current Medication list given to you today.  *If you need a refill on your cardiac medications before your next appointment, please call your pharmacy*  Lab Work: None ordered If you have labs (blood work) drawn today and your tests are completely normal, you will receive your results only by: MyChart Message (if you have MyChart) OR A paper copy in the mail If you have any lab test that is abnormal or we need to change your treatment, we will call you to review the results.  Follow-Up: At [facility], you and your health needs are our priority.  As part of our continuing mission to provide you with exceptional heart care, our providers are all part of one team.  This team includes your primary Cardiologist (physician) and Advanced Practice Providers or APPs (Physician Assistants and Nurse Practitioners) who all work together to provide you with the care you need, when you need it.  Your next appointment:   1 year(s)  Provider:   You may see Will Cortland Ding, MD or one of the following Advanced Practice Providers on your designated Care Team:   Mertha Abrahams, PA-C Michael Andy Tillery, PA-C Suzann Riddle, NP Creighton Doffing, NP

## 2024-03-13 ENCOUNTER — Other Ambulatory Visit: Payer: Self-pay

## 2024-03-13 ENCOUNTER — Ambulatory Visit
Admission: RE | Admit: 2024-03-13 | Discharge: 2024-03-13 | Disposition: A | Discharge status: Home / Self Care | Source: Ambulatory Visit | Attending: Radiation Oncology | Admitting: Radiation Oncology

## 2024-03-13 DIAGNOSIS — Z510 Encounter for antineoplastic radiation therapy: Secondary | ICD-10-CM | POA: Diagnosis not present

## 2024-03-13 LAB — RAD ONC ARIA SESSION SUMMARY
Course Elapsed Days: 44
Plan Fractions Treated to Date: 7
Plan Prescribed Dose Per Fraction: 2.5 Gy
Plan Total Fractions Prescribed: 13
Plan Total Prescribed Dose: 32.5 Gy
Reference Point Dosage Given to Date: 17.5 Gy
Reference Point Session Dosage Given: 2.5 Gy
Session Number: 32

## 2024-03-14 ENCOUNTER — Ambulatory Visit
Admission: RE | Admit: 2024-03-14 | Discharge: 2024-03-14 | Disposition: A | Discharge status: Home / Self Care | Source: Ambulatory Visit | Attending: Radiation Oncology

## 2024-03-14 ENCOUNTER — Other Ambulatory Visit: Payer: Self-pay

## 2024-03-14 ENCOUNTER — Encounter: Payer: Self-pay | Admitting: Cardiology

## 2024-03-14 ENCOUNTER — Ambulatory Visit: Attending: Cardiology | Admitting: Cardiology

## 2024-03-14 VITALS — BP 134/80 | HR 63 | Ht 69.0 in | Wt 253.0 lb

## 2024-03-14 DIAGNOSIS — I2694 Multiple subsegmental pulmonary emboli without acute cor pulmonale: Secondary | ICD-10-CM

## 2024-03-14 DIAGNOSIS — Z510 Encounter for antineoplastic radiation therapy: Secondary | ICD-10-CM | POA: Diagnosis not present

## 2024-03-14 DIAGNOSIS — I4819 Other persistent atrial fibrillation: Secondary | ICD-10-CM | POA: Diagnosis not present

## 2024-03-14 DIAGNOSIS — E7800 Pure hypercholesterolemia, unspecified: Secondary | ICD-10-CM

## 2024-03-14 DIAGNOSIS — I5032 Chronic diastolic (congestive) heart failure: Secondary | ICD-10-CM | POA: Insufficient documentation

## 2024-03-14 DIAGNOSIS — G4733 Obstructive sleep apnea (adult) (pediatric): Secondary | ICD-10-CM

## 2024-03-14 DIAGNOSIS — I77810 Thoracic aortic ectasia: Secondary | ICD-10-CM

## 2024-03-14 DIAGNOSIS — I480 Paroxysmal atrial fibrillation: Secondary | ICD-10-CM

## 2024-03-14 LAB — RAD ONC ARIA SESSION SUMMARY
Course Elapsed Days: 45
Plan Fractions Treated to Date: 8
Plan Prescribed Dose Per Fraction: 2.5 Gy
Plan Total Fractions Prescribed: 13
Plan Total Prescribed Dose: 32.5 Gy
Reference Point Dosage Given to Date: 20 Gy
Reference Point Session Dosage Given: 2.5 Gy
Session Number: 33

## 2024-03-14 MED ORDER — SACUBITRIL-VALSARTAN 97-103 MG PO TABS: 1.0000 | ORAL_TABLET | Freq: Two times a day (BID) | ORAL | 3 refills | Status: DC

## 2024-03-14 NOTE — Patient Instructions (Signed)
 Medication Instructions:  Your physician recommends that you continue on your current medications as directed. Please refer to the Current Medication list given to you today.  *If you need a refill on your cardiac medications before your next appointment, please call your pharmacy*  Lab Work: TODAY:  LIPID  Go to the 1st floor to [facility]  If you have labs (blood work) drawn today and your tests are completely normal, you will receive your results only by: MyChart Message (if you have MyChart) OR A paper copy in the mail If you have any lab test that is abnormal or we need to change your treatment, we will call you to review the results.  Testing/Procedures: Your physician has requested that you have an echocardiogram. Echocardiography is a painless test that uses sound waves to create images of your heart. It provides your doctor with information about the size and shape of your heart and how well your heart's chambers and valves are working. This procedure takes approximately one hour. There are no restrictions for this procedure. Please do NOT wear cologne, perfume, aftershave, or lotions (deodorant is allowed). Please arrive 15 minutes prior to your appointment time.  Please note: We ask at that you not bring children with you during ultrasound (echo/ vascular) testing. Due to room size and safety concerns, children are not allowed in the ultrasound rooms during exams. Our front office staff cannot provide observation of children in our lobby area while testing is being conducted. An adult accompanying a patient to their appointment will only be allowed in the ultrasound room at the discretion of the ultrasound technician under special circumstances. We apologize for any inconvenience.   Follow-Up: At [facility], you and your health needs are our priority.  As part of our continuing mission to provide you with exceptional heart care, our providers are all part of one team.  This  team includes your primary Cardiologist (physician) and Advanced Practice Providers or APPs (Physician Assistants and Nurse Practitioners) who all work together to provide you with the care you need, when you need it.  Your next appointment:   12 month(s)  Provider:   Madonna Large, DO    We recommend signing up for the patient portal called MyChart.  Sign up information is provided on this After Visit Summary.  MyChart is used to connect with patients for Virtual Visits (Telemedicine).  Patients are able to view lab/test results, encounter notes, upcoming appointments, etc.  Non-urgent messages can be sent to your provider as well.   To learn more about what you can do with MyChart, go to [email or web address].   Other Instructions

## 2024-03-14 NOTE — Addendum Note (Signed)
 Addended by: MEMORY DELON POUR on: 03/14/2024 09:02 AM   Modules accepted: Orders

## 2024-03-14 NOTE — Progress Notes (Signed)
 Cardiology Office Note:   Date:  03/14/2024  ID:  Jeffrey Griffin, DOB Aug 31, 1961, MRN [id number] PCP: Juliene Asberry NOVAK, DO  [facility] Providers Cardiologist:  Madonna Large, DO Electrophysiologist:  Will Gladis Norton, MD    History of Present Illness:   Discussed the use of AI scribe software for clinical note transcription with the patient, who gave verbal consent to proceed.  History of Present Illness Jeffrey Griffin is a 63 y.o. male with history of recurrent PE, DVT, prostate cancer s/p RARP w/ lymph node dissections, AF s/p ablation 2021, HFrecEF, hypertension, thoracic aortic aneurysm, OSA. He presents for routine follow-up.  Regarding PE/DVT hx, in January of 2021, patient had a CTA that showed acute subsegmental pulmonary embolism to the right upper and right lower lobes. Started anticoagulation therapy with Eliquis . Then in July 2023 after being off Eliquis  for 2 weeks due to prostatectomy, he was found to have extensive bilateral pulmonary emboli with saddle pulmonary embolus with presence of right heart strain. Bilateral lower extremity ultrasound significant for DVT involving posterior tibial veins bilaterally. He underwent thrombectomy.    Regarding afib history, patient first diagnosed 08/2019 with dyspnea. He was found to be in afib but also with acute PE. He underwent DCCV on 10/20/19 and had improvement in LVEF. Patient then had recurrence of afib and underwent ablation with Dr. Kelsie 07/16/20. Unfortunately found to be back in afib with elevated HR 07/19/20. Decision made in afib clinic to put him back on Amiodarone  (had failed DCCV with this). Amiodarone  then discontinued again by Dr. Kelsie in follow up on 10/18/20 with patient back in NSR.   He has not experienced palpitations since his last visit eleven months ago. He monitors his EKG from time to time, which has consistently shown normal results. He keeps medication on hand in case of future issues. Continues to  follow with EP  He continues to use his CPAP machine every night for obstructive sleep apnea. No problems with shortness of breath, physical activity, or laying flat, but he notes a decreased lung capacity since a past pulmonary embolism. Reports that this limits his ability to perform strenuous physical activities. He can manage day-to-day activities and play with his grandkids but avoids stairs and other demanding exercises. Continues with BID Eliquis  s/p PE and denies bleeding complications.  He monitors his blood pressure at home, which typically reads in the low 130s over mid-70s, consistent with his history of hypertension. He has not experienced any swelling in his legs, denies orthopnea.  Patient's last ultrasound in 2023 showed a dilation of the aortic root.    Today patient denies chest pain, shortness of breath, lower extremity edema, fatigue, palpitations, melena, hematuria, hemoptysis, diaphoresis, weakness, presyncope, syncope, orthopnea, and PND.   Studies Reviewed:    03/10/22 TTE  IMPRESSIONS     1. Left ventricular ejection fraction, by estimation, is 55 to 60%. The  left ventricle has normal function. The left ventricle has no regional  wall motion abnormalities. There is moderate left ventricular hypertrophy.  Left ventricular diastolic  parameters were normal.   2. Right ventricular systolic function is normal. The right ventricular  size is normal. Tricuspid regurgitation signal is inadequate for assessing  PA pressure.   3. The mitral valve is normal in structure. No evidence of mitral valve  regurgitation. No evidence of mitral stenosis.   4. The aortic valve is tricuspid. Aortic valve regurgitation is not  visualized. No aortic stenosis is present.   5. Aortic dilatation  noted. There is dilatation of the aortic root,  measuring 42 mm. There is dilatation of the ascending aorta, measuring 41  mm.   6. The inferior vena cava is normal in size with greater  than 50%  respiratory variability, suggesting right atrial pressure of 3 mmHg.   FINDINGS   Left Ventricle: Left ventricular ejection fraction, by estimation, is 55  to 60%. The left ventricle has normal function. The left ventricle has no  regional wall motion abnormalities. The left ventricular internal cavity  size was normal in size. There is   moderate left ventricular hypertrophy. Left ventricular diastolic  parameters were normal.   Right Ventricle: The right ventricular size is normal. No increase in  right ventricular wall thickness. Right ventricular systolic function is  normal. Tricuspid regurgitation signal is inadequate for assessing PA  pressure.   Left Atrium: Left atrial size was normal in size.   Right Atrium: Right atrial size was normal in size.   Pericardium: There is no evidence of pericardial effusion.   Mitral Valve: The mitral valve is normal in structure. No evidence of  mitral valve regurgitation. No evidence of mitral valve stenosis.   Tricuspid Valve: The tricuspid valve is normal in structure. Tricuspid  valve regurgitation is trivial.   Aortic Valve: The aortic valve is tricuspid. Aortic valve regurgitation is  not visualized. No aortic stenosis is present. Aortic valve mean gradient  measures 8.0 mmHg. Aortic valve peak gradient measures 14.0 mmHg. Aortic  valve area, by VTI measures 2.17   cm.   Pulmonic Valve: The pulmonic valve was not well visualized. Pulmonic valve  regurgitation is not visualized.   Aorta: Aortic dilatation noted. There is dilatation of the aortic root,  measuring 42 mm. There is dilatation of the ascending aorta, measuring 41  mm.   Venous: The inferior vena cava is normal in size with greater than 50%  respiratory variability, suggesting right atrial pressure of 3 mmHg.   IAS/Shunts: The interatrial septum was not well visualized.    Risk Assessment/Calculations:    CHA2DS2-VASc Score = 3   This indicates a  3.2% annual risk of stroke. The patient's score is based upon: CHF History: 1 HTN History: 1 Diabetes History: 0 Stroke History: 0 Vascular Disease History: 1 Age Score: 0 Gender Score: 0             Physical Exam:   VS:  BP 134/80   Pulse 63   Ht 5' 9 (1.753 m)   Wt 253 lb (114.8 kg)   SpO2 94%   BMI 37.36 kg/m    Wt Readings from Last 3 Encounters:  03/14/24 253 lb (114.8 kg)  03/12/24 254 lb (115.2 kg)  01/03/24 250 lb (113.4 kg)     Physical Exam Vitals reviewed.  Constitutional:      Appearance: Normal appearance.  HENT:     Head: Normocephalic.  Eyes:     Pupils: Pupils are equal, round, and reactive to light.  Cardiovascular:     Rate and Rhythm: Normal rate and regular rhythm.     Pulses: Normal pulses.     Heart sounds: Normal heart sounds.  Pulmonary:     Effort: Pulmonary effort is normal.     Breath sounds: Normal breath sounds.  Abdominal:     General: Abdomen is flat.     Palpations: Abdomen is soft.  Musculoskeletal:     Right lower leg: No edema.     Left lower leg: No edema.  Skin:    General: Skin is warm and dry.     Capillary Refill: Capillary refill takes less than 2 seconds.  Neurological:     General: No focal deficit present.     Mental Status: He is alert and oriented to person, place, and time.  Psychiatric:        Mood and Affect: Mood normal.        Behavior: Behavior normal.        Thought Content: Thought content normal.        Judgment: Judgment normal.      ASSESSMENT AND PLAN:     Assessment and Plan Assessment & Plan Heart Failure with Recovered Ejection Fraction No current symptoms of heart failure exacerbation such as swelling or shortness of breath. Discussion of SGLT2 inhibitors (Jardiance or Farxiga) as potential therapy, though not deemed necessary at this time due to well-managed condition and his preference to avoid additional medications.  - Monitor for symptoms of heart failure exacerbation - Consider  SGLT2 inhibitors if symptoms develop - Continue Toprol  XL and Entresto   Atrial Fibrillation Status post ablation in 2021. No recurrent episodes or significant palpitations since last visit. Continues on Eliquis  without bleeding complications. No indication for aspirin use alongside Eliquis  as there is no coronary artery disease. - Continue Eliquis  - Continue Toprol  XL 50mg  - PRN lopressor  25 mg Q6, up to 2 doses, for breakthrough tachycardia   Hypertension Well-controlled with home blood pressure readings averaging low 130s over mid-70s, within target range for someone with a history of heart failure with reduced ejection fraction. No adjustments to medication are necessary unless readings consistently exceed 130/80. - Monitor blood pressure regularly - Adjust medication if blood pressure consistently exceeds 130/80  Hyperlipidemia Patient's LDL 83 as of 01/17/23 labs with PCP.  - Continue Atorvastatin  40mg .  - Repeat lipid panel   Dilated Aortic Root/ascending aorta Previous noted dilation of 42 and 41mm respectively.  - Order ultrasound to reassess  Obstructive Sleep Apnea Managed with CPAP. No significant issues reported with CPAP use, although occasional waking at night is noted. Exploring alternative treatments such as dental devices and [employer], though these have less supporting data. - Continue CPAP therapy - Consider alternative treatments for sleep apnea  History of Prostate Cancer Recurrent PEDD prostate cancer status post RARP with lymph node dissections. Recent PSA checked, but lipid panel was not ordered. Lipid panel last checked in May 2023. - Order lipid panel             Signed, Artist Pouch, PA-C

## 2024-03-15 LAB — LIPID PANEL
Chol/HDL Ratio: 6.2 ratio — ABNORMAL HIGH (ref 0.0–5.0)
Cholesterol, Total: 279 mg/dL — ABNORMAL HIGH (ref 100–199)
HDL: 45 mg/dL (ref >39)
LDL Chol Calc (NIH): 180 mg/dL — ABNORMAL HIGH (ref 0–99)
Triglycerides: 282 mg/dL — ABNORMAL HIGH (ref 0–149)
VLDL Cholesterol Cal: 54 mg/dL — ABNORMAL HIGH (ref 5–40)

## 2024-03-17 ENCOUNTER — Ambulatory Visit: Payer: Self-pay | Admitting: *Deleted

## 2024-03-17 ENCOUNTER — Ambulatory Visit
Admission: RE | Admit: 2024-03-17 | Discharge: 2024-03-17 | Disposition: A | Discharge status: Home / Self Care | Source: Ambulatory Visit | Attending: Radiation Oncology | Admitting: Radiation Oncology

## 2024-03-17 ENCOUNTER — Other Ambulatory Visit: Payer: Self-pay

## 2024-03-17 DIAGNOSIS — Z79899 Other long term (current) drug therapy: Secondary | ICD-10-CM

## 2024-03-17 DIAGNOSIS — Z510 Encounter for antineoplastic radiation therapy: Secondary | ICD-10-CM | POA: Diagnosis not present

## 2024-03-17 DIAGNOSIS — E7800 Pure hypercholesterolemia, unspecified: Secondary | ICD-10-CM

## 2024-03-17 LAB — RAD ONC ARIA SESSION SUMMARY
Course Elapsed Days: 48
Plan Fractions Treated to Date: 9
Plan Prescribed Dose Per Fraction: 2.5 Gy
Plan Total Fractions Prescribed: 13
Plan Total Prescribed Dose: 32.5 Gy
Reference Point Dosage Given to Date: 22.5 Gy
Reference Point Session Dosage Given: 2.5 Gy
Session Number: 34

## 2024-03-17 NOTE — Telephone Encounter (Signed)
-----   Message from Artist Pouch sent at 03/17/2024 11:25 AM EDT ----- Thanks for the update. I'm sorry to hear that he was having side effects with Atorvastatin . Given this lipid panel, we definitely need to pursue other options for cholesterol management. Would  patient be willing to try a different medicine? My personal preference out of all statins is Rosuvastatin as it is the best tolerated and most effective. If he's agreeable, would start Rosuvastatin  20mg  daily with repeat lipid panel and LFTs in 6 weeks. If he prefers non-statin management, would recommend Zetia  10mg  (not nearly as effective). In either case, we should refer to pharmD lipid  clinic for additional alternative med consideration.  Artist Pouch, PA-C  ----- Message ----- From: Gladis Porter HERO, LPN Sent: 2/85/7974  11:23 AM EDT To: Artist Pouch, PA-C  ----- Message from Porter HERO Gladis, LPN sent at 2/85/7974 11:23 AM EDT -----  Please advise--been off statin for a month due to joint aches  Thanks, Porter  ----- Message ----- From: Pouch Artist, PA-C Sent: 03/17/2024  10:12 AM EDT To: Porter HERO Gladis, LPN  Lipid panel shows a significant increase of total cholesterol, triglycerides, and LDL. Please confirm with patient that he has been taking his Atorvastatin . If so, would recommend a repeat fasting  lipid panel to confirm that this significant increase was not a lab error.  ----- Message ----- From: Rebecka Memos Lab Results In Sent: 03/15/2024   1:35 AM EDT To: Artist Pouch, PA-C

## 2024-03-17 NOTE — Telephone Encounter (Signed)
 Me    03/17/24 12:10 PM Result Note Will send further recommendations per Artist Pouch PA-C, to the pts active mychart account to review.    Will send in medication preference and place lab orders, when the pt messages back.  Will go ahead and place the referral to our lipid clinic in the system and have our scheduling dept reach out to him to coordinate.

## 2024-03-17 NOTE — Telephone Encounter (Signed)
  03/14/2024 - Results: Interface, [facility] Lab Results In and others (Newest Message First)           Trudy [place], PA-C    03/17/24 10:12 AM Result Note Lipid panel shows a significant increase of total cholesterol, triglycerides, and LDL. Please confirm with patient that he has been taking his Atorvastatin . If so, would recommend a repeat fasting lipid panel to confirm that this significant increase was not a lab error.     The patient has been notified of the result and verbalized understanding.  All questions (if any) were answered.  Pt states he has not been taking his atorvastatin  for over a month now, due to complaints of joint aches while on the medication.  Pt states since he has been off the medication, his joint pain is gone.  Pt is aware that I will send this information back to [place] Trudy PA-C to further review and advise, and I will follow-up with him accordingly thereafter.   Pt verbalized understanding and agrees with this plan.

## 2024-03-18 ENCOUNTER — Ambulatory Visit
Admission: RE | Admit: 2024-03-18 | Discharge: 2024-03-18 | Disposition: A | Discharge status: Home / Self Care | Source: Ambulatory Visit | Attending: Radiation Oncology

## 2024-03-18 ENCOUNTER — Other Ambulatory Visit: Payer: Self-pay

## 2024-03-18 DIAGNOSIS — Z510 Encounter for antineoplastic radiation therapy: Secondary | ICD-10-CM | POA: Diagnosis not present

## 2024-03-18 LAB — RAD ONC ARIA SESSION SUMMARY
Course Elapsed Days: 49
Plan Fractions Treated to Date: 10
Plan Prescribed Dose Per Fraction: 2.5 Gy
Plan Total Fractions Prescribed: 13
Plan Total Prescribed Dose: 32.5 Gy
Reference Point Dosage Given to Date: 25 Gy
Reference Point Session Dosage Given: 2.5 Gy
Session Number: 35

## 2024-03-18 MED ORDER — EZETIMIBE 10 MG PO TABS: 10.0000 mg | ORAL_TABLET | Freq: Every day | ORAL | 2 refills | Status: AC

## 2024-03-18 NOTE — Addendum Note (Signed)
 Addended by: GLADIS PORTER HERO on: 03/18/2024 08:37 AM   Modules accepted: Orders

## 2024-03-18 NOTE — Telephone Encounter (Signed)
 Pt will try zetia  10 mg po daily and this was sent to his pharmacy of choice.    Pt aware to come for fasting lipids/LFTs in 6 weeks to reassess his numbers.  Pt aware that we referred him to lipid clinic to be seen, and he will get a call back from our scheduling dept to arrange this appt.   Pt agreed to this plan via mychart.

## 2024-03-18 NOTE — Addendum Note (Signed)
 Addended by: GLADIS PORTER HERO on: 03/18/2024 08:41 AM   Modules accepted: Orders

## 2024-03-19 ENCOUNTER — Other Ambulatory Visit: Payer: Self-pay

## 2024-03-19 ENCOUNTER — Ambulatory Visit
Admission: RE | Admit: 2024-03-19 | Discharge: 2024-03-19 | Disposition: A | Discharge status: Home / Self Care | Source: Ambulatory Visit | Attending: Radiation Oncology

## 2024-03-19 DIAGNOSIS — Z510 Encounter for antineoplastic radiation therapy: Secondary | ICD-10-CM | POA: Diagnosis not present

## 2024-03-19 LAB — RAD ONC ARIA SESSION SUMMARY
Course Elapsed Days: 50
Plan Fractions Treated to Date: 11
Plan Prescribed Dose Per Fraction: 2.5 Gy
Plan Total Fractions Prescribed: 13
Plan Total Prescribed Dose: 32.5 Gy
Reference Point Dosage Given to Date: 27.5 Gy
Reference Point Session Dosage Given: 2.5 Gy
Session Number: 36

## 2024-03-20 ENCOUNTER — Other Ambulatory Visit: Payer: Self-pay

## 2024-03-20 ENCOUNTER — Ambulatory Visit
Admission: RE | Admit: 2024-03-20 | Discharge: 2024-03-20 | Disposition: A | Discharge status: Home / Self Care | Source: Ambulatory Visit | Attending: Radiation Oncology

## 2024-03-20 DIAGNOSIS — Z510 Encounter for antineoplastic radiation therapy: Secondary | ICD-10-CM | POA: Diagnosis not present

## 2024-03-20 LAB — RAD ONC ARIA SESSION SUMMARY
Course Elapsed Days: 51
Plan Fractions Treated to Date: 12
Plan Prescribed Dose Per Fraction: 2.5 Gy
Plan Total Fractions Prescribed: 13
Plan Total Prescribed Dose: 32.5 Gy
Reference Point Dosage Given to Date: 30 Gy
Reference Point Session Dosage Given: 2.5 Gy
Session Number: 37

## 2024-03-21 ENCOUNTER — Ambulatory Visit
Admission: RE | Admit: 2024-03-21 | Discharge: 2024-03-21 | Disposition: A | Discharge status: Home / Self Care | Source: Ambulatory Visit | Attending: Radiation Oncology | Admitting: Radiation Oncology

## 2024-03-21 ENCOUNTER — Other Ambulatory Visit: Payer: Self-pay

## 2024-03-21 DIAGNOSIS — C61 Malignant neoplasm of prostate: Secondary | ICD-10-CM

## 2024-03-21 DIAGNOSIS — C775 Secondary and unspecified malignant neoplasm of intrapelvic lymph nodes: Secondary | ICD-10-CM

## 2024-03-21 DIAGNOSIS — Z510 Encounter for antineoplastic radiation therapy: Secondary | ICD-10-CM | POA: Diagnosis not present

## 2024-03-21 LAB — RAD ONC ARIA SESSION SUMMARY
Course Elapsed Days: 52
Plan Fractions Treated to Date: 13
Plan Prescribed Dose Per Fraction: 2.5 Gy
Plan Total Fractions Prescribed: 13
Plan Total Prescribed Dose: 32.5 Gy
Reference Point Dosage Given to Date: 32.5 Gy
Reference Point Session Dosage Given: 2.5 Gy
Session Number: 38

## 2024-03-24 NOTE — Radiation Completion Notes (Addendum)
  Radiation Oncology         [phone] ________________________________  Name: Jeffrey Griffin MRN: [id number]  Date: 03/21/2024  DOB: 1961/02/21  Referring Physician: BENJAMIN HERRICK, M.D. Date of Service: 2024-03-24 Radiation Oncologist: Adina Barge, M.D. [facility]     RADIATION ONCOLOGY END OF TREATMENT NOTE     Diagnosis: 63 y.o. gentleman with a rising PSA of 1.89 and oligometastatic disease in a single left obturator lymph node s/p RALP 02/2022 for Stage pT3bN0, Gleason 4+5 prostate cancer   Intent: Curative     ==========DELIVERED PLANS==========  First Treatment Date: 2024-01-29 Last Treatment Date: 2024-03-21   Plan Name: Prostbed_Pel Site: Prostate Bed Technique: IMRT Mode: Photon Dose Per Fraction: 1.8 Gy Prescribed Dose (Delivered / Prescribed): 45 Gy / 45 Gy Prescribed Fxs (Delivered / Prescribed): 25 / 25   Plan Name: Prostbed_Bst Site: Prostate Bed Technique: IMRT Mode: Photon Dose Per Fraction: 2.5 Gy Prescribed Dose (Delivered / Prescribed): 32.5 Gy / 32.5 Gy Prescribed Fxs (Delivered / Prescribed): 13 / 13     ==========ON TREATMENT VISIT DATES========== 2024-02-01, 2024-02-08, 2024-02-15, 2024-02-22, 2024-03-04, 2024-03-06, 2024-03-14, 2024-03-21     See weekly On Treatment Notes in Epic for details in the Media tab (listed as Progress notes on the On Treatment Visit Dates listed above).  He tolerated the treatments relatively well with some increased nocturia and occasional loose stools.  He also reported modest fatigue.  The patient will receive a call in about one month from the radiation oncology department. He will continue follow up with his urologist, Dr. Cam, as well.  ------------------------------------------------   Donnice Barge, MD [facility]  Radiation Oncology Direct Dial: [phone]  Fax: [phone] [email or web address]  Skype  LinkedIn

## 2024-04-08 NOTE — Progress Notes (Signed)
 Patient was a RadOnc Consult on 01/03/24 for his rising PSA of 1.89 and oligometastatic disease in a single left obturator lymph node s/p RALP 02/2022 for Stage pT3bN0, Gleason 4+5 prostate cancer.  Patient proceed with treatment recommendations of 7.5 week course of salvage external beam therapy and had his final radiation treatment on 03/21/24.   Patient will be scheduled for a post treatment nurse call and has ongoing follow up's with urology.  Next apt 8/14.

## 2024-04-22 ENCOUNTER — Ambulatory Visit (HOSPITAL_COMMUNITY)
Admission: RE | Admit: 2024-04-22 | Discharge: 2024-04-22 | Disposition: A | Discharge status: Home / Self Care | Source: Ambulatory Visit | Attending: Internal Medicine | Admitting: Internal Medicine

## 2024-04-22 ENCOUNTER — Ambulatory Visit
Admission: RE | Admit: 2024-04-22 | Discharge: 2024-04-22 | Disposition: A | Discharge status: Home / Self Care | Source: Ambulatory Visit | Attending: Internal Medicine | Admitting: Internal Medicine

## 2024-04-22 DIAGNOSIS — E7800 Pure hypercholesterolemia, unspecified: Secondary | ICD-10-CM | POA: Insufficient documentation

## 2024-04-22 DIAGNOSIS — Z510 Encounter for antineoplastic radiation therapy: Secondary | ICD-10-CM | POA: Insufficient documentation

## 2024-04-22 DIAGNOSIS — C61 Malignant neoplasm of prostate: Secondary | ICD-10-CM | POA: Insufficient documentation

## 2024-04-22 DIAGNOSIS — C775 Secondary and unspecified malignant neoplasm of intrapelvic lymph nodes: Secondary | ICD-10-CM | POA: Insufficient documentation

## 2024-04-22 DIAGNOSIS — I77810 Thoracic aortic ectasia: Secondary | ICD-10-CM | POA: Insufficient documentation

## 2024-04-22 DIAGNOSIS — I480 Paroxysmal atrial fibrillation: Secondary | ICD-10-CM | POA: Insufficient documentation

## 2024-04-22 LAB — ECHOCARDIOGRAM COMPLETE: S' Lateral: 3.7 cm

## 2024-04-22 NOTE — Telephone Encounter (Signed)
  Spoke with patient today regarding MyChart message. Reports he was out of town this weekend with family and developed rapid heart rate with associated fatigue. Patient notes drinking caffeinated coffee for the first time in several years as well as spending time outside in the heat. He suspects this provoked his arrhythmia. Since noting rapid HR, patient has been managing with his PRN Lopressor  25mg  on top of once daily toprol  XL 50mg . HR running 100-140. Patient did have significant fatigue on Sunday, better yesterday and today but is still very much feeling an irregular rhythm.   Will have patient increase to Toprol  XL 50mg  BID with PRN Lopressor  25mg  up to 2 doses per day with systolic BP >116mmHg. I have scheduled him for afib clinic visit on 8/21 at 11:30am. Discussed ED precautions for acute symptoms such as dyspnea, chest pain, extreme fatigue/lethargy. Patient has not missed any Eliquis  in the past 3 weeks and could have ED DCCV if needed. Otherwise will tentatively plan for afib clinic to arrange outpatient DCCV if patient remains out of rhythm on Thursday.   Artist Pouch, PA-C

## 2024-04-22 NOTE — Progress Notes (Signed)
  Radiation Oncology         [phone] ________________________________  Name: Jeffrey Griffin MRN: [id number]  Date of Service: 04/22/2024  DOB: 03/25/1961  Post Treatment Telephone Note  Diagnosis:  63 y.o. gentleman with a rising PSA of 1.89 and oligometastatic disease in a single left obturator lymph node s/p RALP 02/2022 for Stage pT3bN0, Gleason 4+5 prostate cancer      Pre Treatment IPSS Score: 1 (as documented in the provider consult note)  The patient was not available for call today.

## 2024-04-24 ENCOUNTER — Encounter (HOSPITAL_COMMUNITY): Payer: Self-pay | Admitting: Internal Medicine

## 2024-04-24 ENCOUNTER — Ambulatory Visit (HOSPITAL_COMMUNITY)
Admission: RE | Admit: 2024-04-24 | Discharge: 2024-04-24 | Disposition: A | Discharge status: Home / Self Care | Source: Ambulatory Visit | Attending: Internal Medicine | Admitting: Internal Medicine

## 2024-04-24 ENCOUNTER — Other Ambulatory Visit (HOSPITAL_COMMUNITY): Payer: Self-pay | Admitting: Internal Medicine

## 2024-04-24 VITALS — BP 106/90 | HR 153 | Ht 69.0 in | Wt 258.4 lb

## 2024-04-24 DIAGNOSIS — I4892 Unspecified atrial flutter: Secondary | ICD-10-CM | POA: Diagnosis not present

## 2024-04-24 DIAGNOSIS — D6869 Other thrombophilia: Secondary | ICD-10-CM

## 2024-04-24 DIAGNOSIS — I4819 Other persistent atrial fibrillation: Secondary | ICD-10-CM | POA: Diagnosis not present

## 2024-04-24 MED ORDER — METOPROLOL SUCCINATE ER 50 MG PO TB24: 50.0000 mg | ORAL_TABLET | Freq: Two times a day (BID) | ORAL | 3 refills | Status: DC

## 2024-04-24 MED ORDER — AMIODARONE HCL 200 MG PO TABS: ORAL_TABLET | ORAL | 3 refills | Status: AC

## 2024-04-24 MED ORDER — AMIODARONE HCL 200 MG PO TABS: ORAL_TABLET | ORAL | 3 refills | Status: DC

## 2024-04-24 NOTE — Patient Instructions (Signed)
 Start Amiodarone 200 mg twice a day for 30 days then decrease to 200 mg once a day

## 2024-04-24 NOTE — Progress Notes (Signed)
 Primary Care Physician: Juliene Asberry NOVAK, DO Primary Cardiologist: Dr Alveta Primary Electrophysiologist: Dr Kelsie Referring Physician: Dr Alveta   Jeffrey Griffin is a 63 y.o. male with a history of HTN, chronic systolic CHF, ascending aortic aneurysm, prior PE, and persistent atrial fibrillation who presents for consultation in the [facility].  The patient was initially diagnosed with atrial fibrillation 08/2019 after presenting to urgent care with 4 days of worsening dyspnea. He was found to be in afib and also underwent a CT angiogram which showed an acute PE. He was treated at [facility]. Echo at that time showed an EF of 20-25%. On follow up with Dr Alveta, he underwent successful DCCV on 10/20/19. Patient is on Eliquis  for a CHADS2VASC score of 3. Repeat echo 11/26/19 showed improved EF 40%. Unfortunately, he was back in afib on follow up 04/06/20. He does notice some mild dyspnea with exertion when in afib. He denies significant alcohol use. He does have a h/o OSA. S/p afib ablation with Dr Kelsie on 07/16/20.   On follow up 04/24/24, patient is currently in atrial flutter with RVR. Patient contacted office on 8/19 noting he was in Afib since 8/16, possibly related to drinking coffee for first time in years while out of town. Patient notes to be going in and out of normal rhythm; he notes to have been in normal rhythm on Monday evening. He is taking Toprol  50 mg BID. No missed doses of Eliquis .   Today, he denies symptoms of orthopnea, PND, lower extremity edema, dizziness, presyncope, syncope, bleeding, or neurologic sequela. The patient is tolerating medications without difficulties and is otherwise without complaint today.    Atrial Fibrillation Risk Factors:  he does have symptoms or diagnosis of sleep apnea. he is not on CPAP therapy. he does not have a history of rheumatic fever. he does not have a history of alcohol use. The patient does have a  history of early familial atrial fibrillation or other arrhythmias. Brother and sister have afib.  he has a BMI of Body mass index is 38.16 kg/m.SABRA Filed Weights   04/24/24 1135  Weight: 117.2 kg     Family History  Problem Relation Age of Onset   Hypertrophic cardiomyopathy Mother    Stroke Father    Hypertension Father    Hypertension Sister    Atrial fibrillation Brother      Atrial Fibrillation Management history:  Previous antiarrhythmic drugs: amiodarone  Previous cardioversions: 10/20/19, 05/31/20 Previous ablations: 07/16/20 CHADS2VASC score: 3 Anticoagulation history: Eliquis    Past Medical History:  Diagnosis Date   Abnormality of thoracic aorta    4.3 CM ECTATIC ASENDING    Arthritis    Atrial fibrillation (HCC)    Cardiopathy    CHF (congestive heart failure) (HCC)    SYSTOLIC   ED (erectile dysfunction) 09/11/2023   2022,2021   Elevated PSA 09/11/2023   08/22/2022, 2022, 2021   Epistaxis 03/13/2022   Hyperlipidemia    Hypertension    Lung nodule    Muscle weakness (generalized) 03/12/2023   01/01/2023, 11/30/2022, 11/13/2022, 10/23/2022   Obesity    OSA (obstructive sleep apnea)    Persistent atrial fibrillation (HCC)    WITH RVR   Pleural effusion    RIGHT UPPER LOBE AND RIGHT LOWER LOBE    Pulmonary embolism (HCC)    Stress incontinence, male 09/11/2023   03/12/2023, 01/01/2023, 12/07/2022, 11/30/2022, 11/13/2022   Urinary frequency 04/11/2022   Past Surgical History:  Procedure Laterality Date  APPENDECTOMY     ATRIAL FIBRILLATION ABLATION N/A 07/16/2020   Procedure: ATRIAL FIBRILLATION ABLATION;  Surgeon: Kelsie Agent, MD;  Location: [facility] INVASIVE CV LAB;  Service: Cardiovascular;  Laterality: N/A;   CARDIOVERSION N/A 10/20/2019   Procedure: CARDIOVERSION;  Surgeon: Alveta Aleene PARAS, MD;  Location: [facility] ENDOSCOPY;  Service: Cardiovascular;  Laterality: N/A;   CARDIOVERSION N/A 05/31/2020   Procedure: CARDIOVERSION;  Surgeon: Delford Maude BROCKS, MD;   Location: [facility] ENDOSCOPY;  Service: Cardiovascular;  Laterality: N/A;   IR ANGIOGRAM PULMONARY BILATERAL SELECTIVE  03/09/2022   IR ANGIOGRAM SELECTIVE EACH ADDITIONAL VESSEL  03/09/2022   IR ANGIOGRAM SELECTIVE EACH ADDITIONAL VESSEL  03/09/2022   IR THROMBECT PRIM MECH INIT (INCLU) MOD SED  03/09/2022   IR US  GUIDE VASC ACCESS RIGHT  03/09/2022   PROSTATE BIOPSY     PROSTATECTOMY     RADIOLOGY WITH ANESTHESIA N/A 03/09/2022   Procedure: IR WITH ANESTHESIA;  Surgeon: Radiologist, Medication, MD;  Location: [facility] OR;  Service: Radiology;  Laterality: N/A;   SHOULDER SURGERY Right    VASECTOMY      Current Outpatient Medications  Medication Sig Dispense Refill   albuterol  (VENTOLIN  HFA) 108 (90 Base) MCG/ACT inhaler Inhale 1-2 puffs into the lungs every 6 (six) hours as needed for wheezing or shortness of breath. 1 each 0   apixaban  (ELIQUIS ) 5 MG TABS tablet Take 1 tablet (5 mg total) by mouth 2 (two) times daily. 180 tablet 3   aspirin EC 81 MG tablet Take 81 mg by mouth daily. Swallow whole.     Azelastine HCl 137 MCG/SPRAY SOLN Place into both nostrils.     Carnitine-B5-B6 500-15-5 MG TABS Take 1 tablet by mouth daily.     Cholecalciferol (VITAMIN D3) 25 MCG (1000 UT) CAPS Take 1,000 Units by mouth daily.     Coenzyme Q10 300 MG CAPS Take 1 capsule by mouth daily.     Cyanocobalamin (B-12) 5000 MCG CAPS Take 1 capsule by mouth daily.     cyclobenzaprine  (FLEXERIL ) 10 MG tablet Take 1 tablet (10 mg total) by mouth at bedtime as needed for muscle spasms. 30 tablet 0   diclofenac  Sodium (VOLTAREN ) 1 % GEL Apply 4 g topically 4 (four) times daily. 350 g 0   ezetimibe  (ZETIA ) 10 MG tablet Take 1 tablet (10 mg total) by mouth daily. 90 tablet 2   Garlic 1000 MG CAPS Take 1 capsule by mouth daily.     Glucosamine-Chondroitin (COSAMIN DS PO) Take 2 tablets by mouth daily.     Hawthorn 150 MG CAPS Take 1 capsule by mouth daily at 6 (six) AM.     metoprolol  succinate (TOPROL -XL) 50 MG 24 hr tablet  Take 1 tablet (50 mg total) by mouth daily. Take with or immediately following a meal. (Patient taking differently: Take 50 mg by mouth 2 (two) times daily. Take with or immediately following a meal.) 90 tablet 3   metoprolol  tartrate (LOPRESSOR ) 25 MG tablet TAKE 1 TABLET BY MOUTH EVERY 6 HOURS AS NEEDED (UP TO 2 DOSES A DAY FOR BREAKTHROUGH TACHYCARDIA) 30 tablet 6   Multiple Minerals-Vitamins (CAL MAG ZINC +D3 PO) Take 1 tablet by mouth daily.     Multiple Vitamin (MULTIVITAMIN WITH MINERALS) TABS tablet Take 1 tablet by mouth daily.     Omega-3 1400 MG CAPS Take 1 capsule by mouth daily.     ORGOVYX 120 MG tablet Take 120 mg by mouth daily.     sacubitril -valsartan  (ENTRESTO ) 97-103 MG Take 1  tablet by mouth 2 (two) times daily. 180 tablet 3   saline (AYR) GEL Place 1 Application into the nose every 4 (four) hours as needed (For nasal dryness).  0   tadalafil (CIALIS) 5 MG tablet Take 5 mg by mouth daily.     Taurine 1000 MG CAPS Take 1 capsule by mouth daily.     vitamin E 180 MG (400 UNITS) capsule Take 400 Units by mouth daily.     No current facility-administered medications for this encounter.    Allergies  Allergen Reactions   Atorvastatin  Other (See Comments)    Pt reports causes joint pains    ROS- All systems are reviewed and negative except as per the HPI above.  Physical Exam: Vitals:   04/24/24 1135  BP: (!) 106/90  Pulse: (!) 153  Weight: 117.2 kg  Height: 5' 9 (1.753 m)    GEN- The patient is well appearing, alert and oriented x 3 today.   Neck - no JVD or carotid bruit noted Lungs- Clear to ausculation bilaterally, normal work of breathing Heart- Tachycardic rate, no murmurs, rubs or gallops, PMI not laterally displaced Extremities- no clubbing, cyanosis, or edema Skin - no rash or ecchymosis noted   Wt Readings from Last 3 Encounters:  04/24/24 117.2 kg  03/14/24 114.8 kg  03/12/24 115.2 kg    EKG today demonstrates  Vent. rate 153 BPM PR interval  114 ms QRS duration 104 ms QT/QTcB 264/421 ms P-R-T axes 70 23 255 Atrial flutter with RVR T wave abnormality, consider inferior ischemia Abnormal ECG When compared with ECG of 12-Mar-2024 07:57, Atrial flutter has replaced sinus rhythm Confirmed by Terra Pac ([id number]) on 04/24/2024 11:55:17 AM  Echo 04/22/24 demonstrated  1. Left ventricular ejection fraction, by estimation, is 45 to 50%. The  left ventricle has mildly decreased function. The left ventricle has no  regional wall motion abnormalities. The left ventricular internal cavity  size was moderately dilated. Left  ventricular diastolic function could not be evaluated.   2. Right ventricular systolic function is normal. The right ventricular  size is mildly enlarged. Tricuspid regurgitation signal is inadequate for  assessing PA pressure.   3. Left atrial size was mildly dilated.   4. The mitral valve is normal in structure. Trivial mitral valve  regurgitation. No evidence of mitral stenosis.   5. The aortic valve is normal in structure. Aortic valve regurgitation is  trivial. No aortic stenosis is present.   6. Aortic dilatation noted. There is mild dilatation of the aortic root,  measuring 38 mm. There is mild dilatation of the ascending aorta,  measuring 42 mm.   7. The inferior vena cava is normal in size with greater than 50%  respiratory variability, suggesting right atrial pressure of 3 mmHg.   Epic records are reviewed at length today  CHA2DS2-VASc Score = 3  The patient's score is based upon: CHF History: 1 HTN History: 1 Diabetes History: 0 Stroke History: 0 Vascular Disease History: 1 Age Score: 0 Gender Score: 0      ASSESSMENT AND PLAN: 1. Persistent Atrial Fibrillation/typical atrial flutter/atypical atrial flutter The patient's CHA2DS2-VASc score is 3, indicating a 3.2% annual risk of stroke.   S/p afib and typical flutter ablation with Dr Kelsie on 07/16/20.  Patient is currently in atrial  flutter with RVR. Echo done 2 days ago shows mildly decreased function. We will pursue aggressive rhythm control. After discussion of options with patient, will being amiodarone  200 mg BID x 1  month then transition to 200 mg once daily. Will help arrange follow up with Dr. Inocencio to discuss possible repeat ablation. Patient notes to be paroxysmal at this time; will help arrange 2 week f/u to reassess rhythm.   2. Secondary Hypercoagulable State (ICD10:  D68.69) The patient is at significant risk for stroke/thromboembolism based upon his CHA2DS2-VASc Score of 3.  Continue Apixaban  (Eliquis ).  No missed doses.   3. Chronic systolic CHF EF 45-50% Appears euvolemic today.  4. HTN Stable today.   5. OSA Currently using CPAP.   Follow up 2 weeks. Will help arrange follow up with Dr. Inocencio.   Jeffrey Heinrich, PA-C Af[facility] [address] [place], [place] [address] [phone] 04/24/2024 11:55 AM

## 2024-04-24 NOTE — Addendum Note (Signed)
 Encounter addended by: Franchot Glade RAMAN, RN on: 04/24/2024 4:07 PM  Actions taken: Order list changed

## 2024-04-29 ENCOUNTER — Telehealth: Payer: Self-pay | Admitting: Pharmacist

## 2024-04-29 NOTE — Progress Notes (Unsigned)
 Patient ID: Jeffrey Griffin                 DOB: 04-Feb-1961                    MRN: [id number]      HPI: Jeffrey Griffin is a 63 y.o. male patient referred to lipid clinic by Trudy RIGGERS. PMH is significant for hx of PE, PAF, HF, hypertension, OSA, malignant neoplasm of prostate, HLD.    Patient was unable to tolerate Lipitor 40 mg or even 10 mg dose due to joint aches. Referred to lipid clinic but he wanted to try Zetia . Zetia  was started mid July 2025   The patient presented today for follow-up and reports that he has been tolerating Zetia  well. He has been on Zetia  for about a month. Many years ago, he was prescribed multiple statins but does not recall their names and was told that they did not work for him. More recently, his primary care provider started him on atorvastatin , which he could not tolerate due to joint aches. He follows a clean diet but has been unable to exercise regularly because of lung issues.  During the visit, we reviewed several options for lowering LDL cholesterol, including ezetimibe , PCSK9 inhibitors, bempedoic acid, and inclisiran. The discussion covered their mechanisms of action, dosing, side effects, and potential LDL reductions. We also reviewed cost considerations and potential patient assistance programs to help with affordability.  At this time, the patient is not ready to make any changes to his current lipid-lowering medications. He expressed skepticism about guideline-directed LDL goals. Additionally, he is currently undergoing cancer treatment, which has understandably added complexity to his health management. He would like some time to consider whether he wishes to pursue further cholesterol-lowering therapy.  Current Medications: Zetia  10 mg daily  Intolerances: atorvastatin  40 mg and 10 gm daily - joint ache   Risk Factors: HLD, HTN, family hx of ASCVD  LDL goal: <70  Last lab in July 2025  off of meds: TC 279, TG 282, HDLc 45, LDLc 180 pt did not fast  for this test   Diet: rarely eats processed food, cooks at home, eats out once a week  Drink: water, mushroom coffee in the morning -20 oz,soda 1-2 times per month   Exercise:  Very little due to SOB  Family History:  Relation Problem Comments  Mother (Deceased) Hypertrophic cardiomyopathy     Father (Deceased) Hypertension   Stroke at age of 71      Sister Metallurgist) Hypertension, Afib     Brother Metallurgist) Atrial fibrillation, MI at age of 56 and Stroke at 57      Social History:  Alcohol: 1 beer a month  Smoking: never  Recreational drug use: never  Labs:  Lipid Panel     Component Value Date/Time   CHOL 279 (H) 03/14/2024 0834   TRIG 282 (H) 03/14/2024 0834   HDL 45 03/14/2024 0834   CHOLHDL 6.2 (H) 03/14/2024 0834   CHOLHDL 5.3 (H) 08/14/2015 1015   VLDL 28 08/14/2015 1015   LDLCALC 180 (H) 03/14/2024 0834   LABVLDL 54 (H) 03/14/2024 0834    Past Medical History:  Diagnosis Date   Abnormality of thoracic aorta    4.3 CM ECTATIC ASENDING    Arthritis    Atrial fibrillation (HCC)    Cardiopathy    CHF (congestive heart failure) (HCC)    SYSTOLIC   ED (erectile dysfunction) 09/11/2023   7977,7978  Elevated PSA 09/11/2023   08/22/2022, 2022, 2021   Epistaxis 03/13/2022   Hyperlipidemia    Hypertension    Lung nodule    Muscle weakness (generalized) 03/12/2023   01/01/2023, 11/30/2022, 11/13/2022, 10/23/2022   Obesity    OSA (obstructive sleep apnea)    Persistent atrial fibrillation (HCC)    WITH RVR   Pleural effusion    RIGHT UPPER LOBE AND RIGHT LOWER LOBE    Pulmonary embolism (HCC)    Stress incontinence, male 09/11/2023   03/12/2023, 01/01/2023, 12/07/2022, 11/30/2022, 11/13/2022   Urinary frequency 04/11/2022    Current Outpatient Medications on File Prior to Visit  Medication Sig Dispense Refill   albuterol  (VENTOLIN  HFA) 108 (90 Base) MCG/ACT inhaler Inhale 1-2 puffs into the lungs every 6 (six) hours as needed for wheezing or shortness of breath. 1  each 0   amiodarone  (PACERONE ) 200 MG tablet Take 1 tablet (200 mg total) by mouth 2 (two) times daily for 30 days, THEN 1 tablet (200 mg total) daily. 60 tablet 3   apixaban  (ELIQUIS ) 5 MG TABS tablet Take 1 tablet (5 mg total) by mouth 2 (two) times daily. 180 tablet 3   aspirin EC 81 MG tablet Take 81 mg by mouth daily. Swallow whole.     Azelastine HCl 137 MCG/SPRAY SOLN Place into both nostrils.     Carnitine-B5-B6 500-15-5 MG TABS Take 1 tablet by mouth daily.     Cholecalciferol (VITAMIN D3) 25 MCG (1000 UT) CAPS Take 1,000 Units by mouth daily.     Coenzyme Q10 300 MG CAPS Take 1 capsule by mouth daily.     Cyanocobalamin (B-12) 5000 MCG CAPS Take 1 capsule by mouth daily.     cyclobenzaprine  (FLEXERIL ) 10 MG tablet Take 1 tablet (10 mg total) by mouth at bedtime as needed for muscle spasms. 30 tablet 0   diclofenac  Sodium (VOLTAREN ) 1 % GEL Apply 4 g topically 4 (four) times daily. 350 g 0   ezetimibe  (ZETIA ) 10 MG tablet Take 1 tablet (10 mg total) by mouth daily. 90 tablet 2   Garlic 1000 MG CAPS Take 1 capsule by mouth daily.     Glucosamine-Chondroitin (COSAMIN DS PO) Take 2 tablets by mouth daily.     Hawthorn 150 MG CAPS Take 1 capsule by mouth daily at 6 (six) AM.     metoprolol  succinate (TOPROL -XL) 50 MG 24 hr tablet Take 1 tablet (50 mg total) by mouth 2 (two) times daily. Take with or immediately following a meal. 60 tablet 3   metoprolol  tartrate (LOPRESSOR ) 25 MG tablet TAKE 1 TABLET BY MOUTH EVERY 6 HOURS AS NEEDED (UP TO 2 DOSES A DAY FOR BREAKTHROUGH TACHYCARDIA) 30 tablet 6   Multiple Minerals-Vitamins (CAL MAG ZINC +D3 PO) Take 1 tablet by mouth daily.     Multiple Vitamin (MULTIVITAMIN WITH MINERALS) TABS tablet Take 1 tablet by mouth daily.     Omega-3 1400 MG CAPS Take 1 capsule by mouth daily.     ORGOVYX 120 MG tablet Take 120 mg by mouth daily.     sacubitril -valsartan  (ENTRESTO ) 97-103 MG Take 1 tablet by mouth 2 (two) times daily. 180 tablet 3   saline (AYR)  GEL Place 1 Application into the nose every 4 (four) hours as needed (For nasal dryness).  0   tadalafil (CIALIS) 5 MG tablet Take 5 mg by mouth daily.     Taurine 1000 MG CAPS Take 1 capsule by mouth daily.     vitamin E  180 MG (400 UNITS) capsule Take 400 Units by mouth daily.     No current facility-administered medications on file prior to visit.    Allergies  Allergen Reactions   Atorvastatin  Other (See Comments)    Pt reports causes joint pains    Assessment/Plan:  1. Hyperlipidemia -  Problem  Hyperlipidemia   Current Medications: Zetia  10 mg daily  Intolerances: atorvastatin  40 mg and 10 gm daily - joint ache   Risk Factors: HLD, HTN, family hx of ASCVD  LDL goal: <70  Last lab in July 2025  off of meds: TC 279, TG 282, HDLc 45, LDLc 180 pt did not fast for this test     Hyperlipidemia Assessment:  Patient is currently on ezetimibe  (Zetia ), which he has been tolerating well for the past month LDL goal: <70 Last lab in July 2025  off of meds: TC 279, TG 282, HDLc 45, LDLc 180 pt did not fast for this test  He has a history of statin intolerance, including recent intolerance to atorvastatin  (joint aches) He reports following a clean diet but is unable to exercise due to underlying lung issues Patient is currently undergoing cancer treatment, which is his primary focus. He  is not ready to pursue additional lipid-lowering therapies at this time  Plan: Continue ezetimibe  10 mg daily No changes to lipid-lowering therapy at this time per patient preference Revisit discussion on LDL-lowering options (PCSK9 inhibitors, bempedoic acid, inclisiran) at a future visit when the patient is ready Encourage ongoing adherence to a heart-healthy diet Pt to call back in 3 weeks whether he is ready to optimize therapy or not      Thank you,  Robbi Blanch, Pharm.D [facility]. [facility] [address], [place], [place] [address] Phone:  [phone]; Fax: [phone]

## 2024-04-30 ENCOUNTER — Ambulatory Visit: Attending: Cardiovascular Disease | Admitting: Pharmacist

## 2024-04-30 ENCOUNTER — Other Ambulatory Visit (HOSPITAL_COMMUNITY): Payer: Self-pay

## 2024-04-30 ENCOUNTER — Encounter: Payer: Self-pay | Admitting: Pharmacist

## 2024-04-30 ENCOUNTER — Telehealth: Payer: Self-pay | Admitting: Pharmacy Technician

## 2024-04-30 DIAGNOSIS — E7849 Other hyperlipidemia: Secondary | ICD-10-CM | POA: Diagnosis not present

## 2024-04-30 NOTE — Assessment & Plan Note (Signed)
 Assessment:  Patient is currently on ezetimibe  (Zetia ), which he has been tolerating well for the past month LDL goal: <70 Last lab in July 2025  off of meds: TC 279, TG 282, HDLc 45, LDLc 180 pt did not fast for this test  He has a history of statin intolerance, including recent intolerance to atorvastatin  (joint aches) He reports following a clean diet but is unable to exercise due to underlying lung issues Patient is currently undergoing cancer treatment, which is his primary focus. He  is not ready to pursue additional lipid-lowering therapies at this time  Plan: Continue ezetimibe  10 mg daily No changes to lipid-lowering therapy at this time per patient preference Revisit discussion on LDL-lowering options (PCSK9 inhibitors, bempedoic acid, inclisiran) at a future visit when the patient is ready Encourage ongoing adherence to a heart-healthy diet Pt to call back in 3 weeks whether he is ready to optimize therapy or not

## 2024-04-30 NOTE — Telephone Encounter (Signed)
 Pharmacy Patient Advocate Encounter  Received notification from [employer] that Prior Authorization for repatha has been APPROVED from 03/31/24 to 04/30/25. Ran test claim, Copay is $0.00- one month. This test claim was processed through [facility]- copay amounts may vary at other pharmacies due to pharmacy/plan contracts, or as the patient moves through the different stages of their insurance plan.   PA #/Case ID/Reference #: [id number]

## 2024-04-30 NOTE — Telephone Encounter (Signed)
 Pharmacy Patient Advocate Encounter  Received notification from [employer] that Prior Authorization for vascepa/icosapent has been APPROVED from 03/31/24 to 04/30/25. Ran test claim, Copay is $0.00 one month. This test claim was processed through [facility]- copay amounts may vary at other pharmacies due to pharmacy/plan contracts, or as the patient moves through the different stages of their insurance plan.   PA #/Case ID/Reference #: [id number]

## 2024-04-30 NOTE — Telephone Encounter (Signed)
 Patient not ready to go on any medication yet so will think about it and will call us  in 3 weeks see office encounter for more detail

## 2024-04-30 NOTE — Patient Instructions (Addendum)
 Your Results:             Your most recent labs Goal  Total Cholesterol 279 < 200  Triglycerides 282 < 150  HDL (happy/good cholesterol) 45 > 40  LDL (lousy/bad cholesterol 180 < 70   Medication changes: None, continue taking Zetia  10 mg daily  Other meds that we discussed today are - Crestor, Repatha and Praluent  Praluent is a cholesterol medication that improved your body's ability to get rid of bad cholesterol known as LDL. It can lower your LDL up to 60%. It is an injection that is given under the skin every 2 weeks. The most common side effects of Praluent include runny nose, symptoms of the common cold, rarely flu or flu-like symptoms, back/muscle pain in about 3-4% of the patients, and redness, pain, or bruising at the injection site.    Repatha is a cholesterol medication that improved your body's ability to get rid of bad cholesterol known as LDL. It can lower your LDL up to 60%! It is an injection that is given under the skin every 2 weeks. The medication often requires a prior authorization from your insurance company.  The most common side effects of Repatha include runny nose, symptoms of the common cold, rarely flu or flu-like symptoms, back/muscle pain in about 3-4% of the patients, and redness, pain, or bruising at the injection site.

## 2024-04-30 NOTE — Telephone Encounter (Addendum)
 Pharmacy Patient Advocate Encounter   Received notification from Pt Calls Messages that prior authorization for Vascepa is required/requested.   Insurance verification completed.   The patient is insured through [employer] .   Per test claim: PA required; PA submitted to above mentioned insurance via Latent Key/confirmation #/EOC [id number] Status is pending

## 2024-04-30 NOTE — Telephone Encounter (Signed)
 Pharmacy Patient Advocate Encounter   Received notification from Pt Calls Messages that prior authorization for repatha is required/requested.   Insurance verification completed.   The patient is insured through [employer] .   Per test claim: PA required; PA submitted to above mentioned insurance via Latent Key/confirmation #/EOC [facility] Status is pending

## 2024-05-08 ENCOUNTER — Ambulatory Visit (HOSPITAL_COMMUNITY)
Admission: RE | Admit: 2024-05-08 | Discharge: 2024-05-08 | Disposition: A | Discharge status: Home / Self Care | Source: Ambulatory Visit | Attending: Internal Medicine | Admitting: Internal Medicine

## 2024-05-08 DIAGNOSIS — I4819 Other persistent atrial fibrillation: Secondary | ICD-10-CM | POA: Diagnosis not present

## 2024-05-08 DIAGNOSIS — Z79899 Other long term (current) drug therapy: Secondary | ICD-10-CM

## 2024-05-08 DIAGNOSIS — D6869 Other thrombophilia: Secondary | ICD-10-CM

## 2024-05-08 NOTE — Progress Notes (Signed)
 Patient began amiodarone  load on 8/21. Continue amiodarone  load and transition to 200 mg once daily after 4 weeks of 200 mg BID.   ECG today shows  Vent. rate 54 BPM PR interval 186 ms QRS duration 110 ms QT/QTcB 450/426 ms P-R-T axes 31 19 11  Sinus bradycardia Nonspecific T wave abnormality Abnormal ECG When compared with ECG of 24-Apr-2024 11:37, Sinus rhythm has replaced atrial flutter.   Follow up with Dr. Inocencio as scheduled.

## 2024-05-28 ENCOUNTER — Telehealth: Payer: Self-pay | Admitting: Pharmacist

## 2024-05-28 NOTE — Telephone Encounter (Signed)
 A follow-up call was made to the patient regarding the optimization of lipid therapy (see OV notes from 04/30/2024 for additional details). The patient stated that he would like to focus on implementing lifestyle changes to help lower his triglycerides and LDL-C. He plans to follow up with his PCP in a few months to reassess his lipid levels through lab work. At this time, the patient is not interested in initiating any additional medications and is, in fact, aiming to reduce the number of medications he is currently taking.

## 2024-06-07 ENCOUNTER — Encounter: Payer: Self-pay | Admitting: Cardiology

## 2024-06-09 ENCOUNTER — Inpatient Hospital Stay: Attending: Hematology and Oncology

## 2024-06-09 DIAGNOSIS — Z79899 Other long term (current) drug therapy: Secondary | ICD-10-CM | POA: Diagnosis not present

## 2024-06-09 DIAGNOSIS — C775 Secondary and unspecified malignant neoplasm of intrapelvic lymph nodes: Secondary | ICD-10-CM | POA: Insufficient documentation

## 2024-06-09 DIAGNOSIS — Z86711 Personal history of pulmonary embolism: Secondary | ICD-10-CM | POA: Insufficient documentation

## 2024-06-09 DIAGNOSIS — Z86718 Personal history of other venous thrombosis and embolism: Secondary | ICD-10-CM | POA: Diagnosis not present

## 2024-06-09 DIAGNOSIS — I2699 Other pulmonary embolism without acute cor pulmonale: Secondary | ICD-10-CM

## 2024-06-09 DIAGNOSIS — C61 Malignant neoplasm of prostate: Secondary | ICD-10-CM | POA: Insufficient documentation

## 2024-06-09 LAB — CBC WITH DIFFERENTIAL (CANCER CENTER ONLY)
Abs Immature Granulocytes: 0.01 K/uL (ref 0.00–0.07)
Basophils Absolute: 0.1 K/uL (ref 0.0–0.1)
Basophils Relative: 1 %
Eosinophils Absolute: 0.2 K/uL (ref 0.0–0.5)
Eosinophils Relative: 4 %
HCT: 41.2 % (ref 39.0–52.0)
Hemoglobin: 13.7 g/dL (ref 13.0–17.0)
Immature Granulocytes: 0 %
Lymphocytes Relative: 10 %
Lymphs Abs: 0.5 K/uL — ABNORMAL LOW (ref 0.7–4.0)
MCH: 31.1 pg (ref 26.0–34.0)
MCHC: 33.3 g/dL (ref 30.0–36.0)
MCV: 93.6 fL (ref 80.0–100.0)
Monocytes Absolute: 0.6 K/uL (ref 0.1–1.0)
Monocytes Relative: 13 %
Neutro Abs: 3.4 K/uL (ref 1.7–7.7)
Neutrophils Relative %: 72 %
Platelet Count: 200 K/uL (ref 150–400)
RBC: 4.4 MIL/uL (ref 4.22–5.81)
RDW: 12.7 % (ref 11.5–15.5)
WBC Count: 4.7 K/uL (ref 4.0–10.5)
nRBC: 0 % (ref 0.0–0.2)

## 2024-06-09 LAB — CMP (CANCER CENTER ONLY)
ALT: 26 U/L (ref 0–44)
AST: 21 U/L (ref 15–41)
Albumin: 4 g/dL (ref 3.5–5.0)
Alkaline Phosphatase: 72 U/L (ref 38–126)
Anion gap: 5 (ref 5–15)
BUN: 12 mg/dL (ref 6–20)
CO2: 30 mmol/L (ref 22–32)
Calcium: 11 mg/dL — ABNORMAL HIGH (ref 8.9–10.3)
Chloride: 108 mmol/L (ref 98–111)
Creatinine: 0.97 mg/dL (ref 0.61–1.24)
GFR, Estimated: >60 mL/min (ref >60)
Glucose, Bld: 112 mg/dL — ABNORMAL HIGH (ref 70–99)
Potassium: 4.3 mmol/L (ref 3.5–5.1)
Sodium: 143 mmol/L (ref 135–145)
Total Bilirubin: 0.4 mg/dL (ref 0.0–1.2)
Total Protein: 6.7 g/dL (ref 6.5–8.1)

## 2024-06-09 MED ORDER — SACUBITRIL-VALSARTAN 97-103 MG PO TABS: 1.0000 | ORAL_TABLET | Freq: Two times a day (BID) | ORAL | 3 refills | Status: AC

## 2024-06-11 ENCOUNTER — Ambulatory Visit: Admitting: Podiatry

## 2024-06-11 DIAGNOSIS — Q667 Congenital pes cavus, unspecified foot: Secondary | ICD-10-CM | POA: Diagnosis not present

## 2024-06-11 DIAGNOSIS — M722 Plantar fascial fibromatosis: Secondary | ICD-10-CM

## 2024-06-11 NOTE — Progress Notes (Signed)
 Subjective:  Patient ID: Jeffrey Griffin, male    DOB: 08/26/1961,  MRN: [id number]  Chief Complaint  Patient presents with   Plantar Fasciitis    Pt is here for plantar fascitis     63 y.o. male presents with the above complaint.  Patient presents with recurrence of left plantar fasciitis he states been going on for quite some time is progressive gotten worse he would like to discuss treatment options for he was treated in the past.  He saw Dr. Alona few months ago he got a steroid injection which helped but it came back again denies any other complaints   Review of Systems: Negative except as noted in the HPI. Denies N/V/F/Ch.  Past Medical History:  Diagnosis Date   Abnormality of thoracic aorta    4.3 CM ECTATIC ASENDING    Arthritis    Atrial fibrillation (HCC)    Cardiopathy    CHF (congestive heart failure) (HCC)    SYSTOLIC   ED (erectile dysfunction) 09/11/2023   2022,2021   Elevated PSA 09/11/2023   08/22/2022, 2022, 2021   Epistaxis 03/13/2022   Hyperlipidemia    Hypertension    Lung nodule    Muscle weakness (generalized) 03/12/2023   01/01/2023, 11/30/2022, 11/13/2022, 10/23/2022   Obesity    OSA (obstructive sleep apnea)    Persistent atrial fibrillation (HCC)    WITH RVR   Pleural effusion    RIGHT UPPER LOBE AND RIGHT LOWER LOBE    Pulmonary embolism (HCC)    Stress incontinence, male 09/11/2023   03/12/2023, 01/01/2023, 12/07/2022, 11/30/2022, 11/13/2022   Urinary frequency 04/11/2022    Current Outpatient Medications:    albuterol  (VENTOLIN  HFA) 108 (90 Base) MCG/ACT inhaler, Inhale 1-2 puffs into the lungs every 6 (six) hours as needed for wheezing or shortness of breath., Disp: 1 each, Rfl: 0   amiodarone  (PACERONE ) 200 MG tablet, Take 1 tablet (200 mg total) by mouth 2 (two) times daily for 30 days, THEN 1 tablet (200 mg total) daily., Disp: 60 tablet, Rfl: 3   apixaban  (ELIQUIS ) 5 MG TABS tablet, Take 1 tablet (5 mg total) by mouth 2 (two) times daily.,  Disp: 180 tablet, Rfl: 3   aspirin EC 81 MG tablet, Take 81 mg by mouth daily. Swallow whole., Disp: , Rfl:    Azelastine HCl 137 MCG/SPRAY SOLN, Place into both nostrils., Disp: , Rfl:    Carnitine-B5-B6 500-15-5 MG TABS, Take 1 tablet by mouth daily., Disp: , Rfl:    Cholecalciferol (VITAMIN D3) 25 MCG (1000 UT) CAPS, Take 1,000 Units by mouth daily., Disp: , Rfl:    Coenzyme Q10 300 MG CAPS, Take 1 capsule by mouth daily., Disp: , Rfl:    Cyanocobalamin (B-12) 5000 MCG CAPS, Take 1 capsule by mouth daily., Disp: , Rfl:    cyclobenzaprine  (FLEXERIL ) 10 MG tablet, Take 1 tablet (10 mg total) by mouth at bedtime as needed for muscle spasms., Disp: 30 tablet, Rfl: 0   diclofenac  Sodium (VOLTAREN ) 1 % GEL, Apply 4 g topically 4 (four) times daily., Disp: 350 g, Rfl: 0   ezetimibe  (ZETIA ) 10 MG tablet, Take 1 tablet (10 mg total) by mouth daily., Disp: 90 tablet, Rfl: 2   Garlic 1000 MG CAPS, Take 1 capsule by mouth daily., Disp: , Rfl:    Glucosamine-Chondroitin (COSAMIN DS PO), Take 2 tablets by mouth daily., Disp: , Rfl:    Hawthorn 150 MG CAPS, Take 1 capsule by mouth daily at 6 (six) AM., Disp: , Rfl:  metoprolol  succinate (TOPROL -XL) 50 MG 24 hr tablet, Take 1 tablet (50 mg total) by mouth 2 (two) times daily. Take with or immediately following a meal., Disp: 60 tablet, Rfl: 3   metoprolol  tartrate (LOPRESSOR ) 25 MG tablet, TAKE 1 TABLET BY MOUTH EVERY 6 HOURS AS NEEDED (UP TO 2 DOSES A DAY FOR BREAKTHROUGH TACHYCARDIA), Disp: 30 tablet, Rfl: 6   Multiple Minerals-Vitamins (CAL MAG ZINC +D3 PO), Take 1 tablet by mouth daily., Disp: , Rfl:    Multiple Vitamin (MULTIVITAMIN WITH MINERALS) TABS tablet, Take 1 tablet by mouth daily., Disp: , Rfl:    Omega-3 1400 MG CAPS, Take 1 capsule by mouth daily., Disp: , Rfl:    ORGOVYX 120 MG tablet, Take 120 mg by mouth daily., Disp: , Rfl:    sacubitril -valsartan  (ENTRESTO ) 97-103 MG, Take 1 tablet by mouth 2 (two) times daily., Disp: 180 tablet, Rfl:  3   saline (AYR) GEL, Place 1 Application into the nose every 4 (four) hours as needed (For nasal dryness)., Disp: , Rfl: 0   tadalafil (CIALIS) 5 MG tablet, Take 5 mg by mouth daily., Disp: , Rfl:    Taurine 1000 MG CAPS, Take 1 capsule by mouth daily., Disp: , Rfl:    vitamin E 180 MG (400 UNITS) capsule, Take 400 Units by mouth daily., Disp: , Rfl:   Social History   Tobacco Use  Smoking Status Never  Smokeless Tobacco Never  Tobacco Comments   Never smoked 04/24/24    Allergies  Allergen Reactions   Atorvastatin  Other (See Comments)    Pt reports causes joint pains   Objective:  There were no vitals filed for this visit. There is no height or weight on file to calculate BMI. Constitutional Well developed. Well nourished.  Vascular Dorsalis pedis pulses palpable bilaterally. Posterior tibial pulses palpable bilaterally. Capillary refill normal to all digits.  No cyanosis or clubbing noted. Pedal hair growth normal.  Neurologic Normal speech. Oriented to person, place, and time. Epicritic sensation to light touch grossly present bilaterally.  Dermatologic Nails well groomed and normal in appearance. No open wounds. No skin lesions.  Orthopedic: Normal joint ROM without pain or crepitus bilaterally. No visible deformities. Tender to palpation at the calcaneal tuber left. No pain with calcaneal squeeze left. Ankle ROM diminished range of motion left. Silfverskiold Test: positive left.   Radiographs: None  Assessment:   1. Plantar fasciitis of left foot   2. Pes cavus    Plan:  Patient was evaluated and treated and all questions answered.  Plantar Fasciitis, left - XR reviewed as above.  - Educated on icing and stretching. Instructions given.  - Injection delivered to the plantar fascia as below. - DME: Plantar fascial brace dispensed to support the medial longitudinal arch of the foot and offload pressure from the heel and prevent arch collapse during  weightbearing - Pharmacologic management: None  Pes  cavus/foot deformity -I explained to patient the etiology of pes planovalgus and relationship with heel pain/arch pain and various treatment options were discussed.  Given patient foot structure in the setting of heel pain/arch pain I believe patient will benefit from custom-made orthotics to help control the hindfoot motion support the arch of the foot and take the stress away from arches.  Patient agrees with the plan like to proceed with orthotics -Patient was casted for orthotics   Procedure: Injection Tendon/Ligament Location: Left plantar fascia at the glabrous junction; medial approach. Skin Prep: alcohol Injectate: 0.5 cc 0.5% marcaine plain,  0.5 cc of 1% Lidocaine , 0.5 cc kenalog  10. Disposition: Patient tolerated procedure well. Injection site dressed with a band-aid.  No follow-ups on file.

## 2024-06-12 ENCOUNTER — Other Ambulatory Visit: Payer: Self-pay | Admitting: Physician Assistant

## 2024-06-12 DIAGNOSIS — I2699 Other pulmonary embolism without acute cor pulmonale: Secondary | ICD-10-CM

## 2024-06-12 NOTE — Progress Notes (Unsigned)
 [facility] Telephone:([phone]) [phone]   Fax:([phone]) [phone]  PROGRESS NOTE  Patient Care Team: Juliene Asberry NOVAK, DO as PCP - General (Internal Medicine) Inocencio Soyla Lunger, MD as PCP - Electrophysiology (Cardiology) Michele Richardson, DO as PCP - Cardiology (Cardiology) Vertell Pont, RN as Oncology Nurse Navigator  Hematological/Oncological History # Recurrent DVT and Pulmonary Embolism 09/15/2019: CTA showed acute subsegmental pulmonary embolism to the right upper and right lower lobes. Started anticoagulation therapy with Eliquis . 03/08/2022-03/14/2022: Admitted after presenting with shortness of breath and back pain after being off Eliquis  for 2 weeks due to prostatectomy. CTA chest reveled extensive bilateral pulmonary emboli with saddle pulmonary embolus with presence of right heart strain. Bilateral lower extremity ultrasound significant for DVT involving posterior tibial veins bilaterally. Echo significant for EF 55-60%, moderate left ventricular hypertrophy, aortic dilatation with dilatation of aortic root 42 mm. Dilatation of ascending aorta measuring 41mm. CCM and interventional radiology consulted, patient had thrombectomy on 7/6. Patient was treated with heparin  and then transitioned to home Eliquis . 10/10/2022: Establish care with [facility]ematology  Interval History:  Jeffrey Griffin 63 y.o. male with medical history significant for recurrent DVT and PE who presents for a follow up visit.    Mr. Pilar reports he is doing well without any new or worsening symptoms.  He completed radiation in July 2025 to his nodal metastasis from prostate cancer.  He is tolerating Eliquis  therapy without any signs of easy bruising or overt signs of bleeding including hematochezia or melena.  Patient denies any changes to his appetite or weight.  His energy levels are stable and he can complete all his daily activities on his own.  He denies nausea, vomiting or bowel habit changes.  He denies  fevers, chills, night sweats, shortness of breath, chest pain or cough.  He has no other complaints.  Rest of the 10 point ROS as below.   MEDICAL HISTORY:  Past Medical History:  Diagnosis Date   Abnormality of thoracic aorta    4.3 CM ECTATIC ASENDING    Arthritis    Atrial fibrillation (HCC)    Cardiopathy    CHF (congestive heart failure) (HCC)    SYSTOLIC   ED (erectile dysfunction) 09/11/2023   2022,2021   Elevated PSA 09/11/2023   08/22/2022, 2022, 2021   Epistaxis 03/13/2022   Hyperlipidemia    Hypertension    Lung nodule    Muscle weakness (generalized) 03/12/2023   01/01/2023, 11/30/2022, 11/13/2022, 10/23/2022   Obesity    OSA (obstructive sleep apnea)    Persistent atrial fibrillation (HCC)    WITH RVR   Pleural effusion    RIGHT UPPER LOBE AND RIGHT LOWER LOBE    Pulmonary embolism (HCC)    Stress incontinence, male 09/11/2023   03/12/2023, 01/01/2023, 12/07/2022, 11/30/2022, 11/13/2022   Urinary frequency 04/11/2022    SURGICAL HISTORY: Past Surgical History:  Procedure Laterality Date   APPENDECTOMY     ATRIAL FIBRILLATION ABLATION N/A 07/16/2020   Procedure: ATRIAL FIBRILLATION ABLATION;  Surgeon: Kelsie Agent, MD;  Location: [facility] INVASIVE CV LAB;  Service: Cardiovascular;  Laterality: N/A;   CARDIOVERSION N/A 10/20/2019   Procedure: CARDIOVERSION;  Surgeon: Alveta Aleene PARAS, MD;  Location: [facility] ENDOSCOPY;  Service: Cardiovascular;  Laterality: N/A;   CARDIOVERSION N/A 05/31/2020   Procedure: CARDIOVERSION;  Surgeon: Delford Maude BROCKS, MD;  Location: [facility] ENDOSCOPY;  Service: Cardiovascular;  Laterality: N/A;   IR ANGIOGRAM PULMONARY BILATERAL SELECTIVE  03/09/2022   IR ANGIOGRAM SELECTIVE EACH ADDITIONAL VESSEL  03/09/2022  IR ANGIOGRAM SELECTIVE EACH ADDITIONAL VESSEL  03/09/2022   IR THROMBECT PRIM MECH INIT (INCLU) MOD SED  03/09/2022   IR US  GUIDE VASC ACCESS RIGHT  03/09/2022   PROSTATE BIOPSY     PROSTATECTOMY     RADIOLOGY WITH ANESTHESIA N/A 03/09/2022    Procedure: IR WITH ANESTHESIA;  Surgeon: Radiologist, Medication, MD;  Location: [facility] OR;  Service: Radiology;  Laterality: N/A;   SHOULDER SURGERY Right    VASECTOMY      SOCIAL HISTORY: Social History   Socioeconomic History   Marital status: Married    Spouse name: Not on file   Number of children: Not on file   Years of education: Not on file   Highest education level: Not on file  Occupational History   Not on file  Tobacco Use   Smoking status: Never   Smokeless tobacco: Never   Tobacco comments:    Never smoked 04/24/24  Vaping Use   Vaping status: Never Used  Substance and Sexual Activity   Alcohol use: Not Currently   Drug use: No   Sexual activity: Yes  Other Topics Concern   Not on file  Social History Narrative   Lives in [place].   Social Drivers of Health   Financial Resource Strain: Patient Declined (01/15/2023)   Overall Financial Resource Strain (CARDIA)    Difficulty of Paying Living Expenses: Patient declined  Food Insecurity: No Food Insecurity (01/03/2024)   Hunger Vital Sign    Worried About Running Out of Food in the Last Year: Never true    Ran Out of Food in the Last Year: Never true  Transportation Needs: No Transportation Needs (01/03/2024)   PRAPARE - Administrator, Civil Service (Medical): No    Lack of Transportation (Non-Medical): No  Physical Activity: Unknown (01/15/2023)   Exercise Vital Sign    Days of Exercise per Week: Patient declined    Minutes of Exercise per Session: Not on file  Stress: Patient Declined (01/15/2023)   [employer] of Occupational Health - Occupational Stress Questionnaire    Feeling of Stress : Patient declined  Social Connections: Unknown (01/15/2023)   Social Connection and Isolation Panel    Frequency of Communication with Friends and Family: Patient declined    Frequency of Social Gatherings with Friends and Family: Patient declined    Attends Religious Services: Patient declined     Database administrator or Organizations: Patient declined    Attends Banker Meetings: Not on file    Marital Status: Patient declined  Intimate Partner Violence: Not At Risk (01/03/2024)   Humiliation, Afraid, Rape, and Kick questionnaire    Fear of Current or Ex-Partner: No    Emotionally Abused: No    Physically Abused: No    Sexually Abused: No    FAMILY HISTORY: Family History  Problem Relation Age of Onset   Hypertrophic cardiomyopathy Mother    Stroke Father    Hypertension Father    Hypertension Sister    Atrial fibrillation Brother     ALLERGIES:  is allergic to atorvastatin .  MEDICATIONS:  Current Outpatient Medications  Medication Sig Dispense Refill   amiodarone  (PACERONE ) 200 MG tablet Take 1 tablet (200 mg total) by mouth 2 (two) times daily for 30 days, THEN 1 tablet (200 mg total) daily. 60 tablet 3   apixaban  (ELIQUIS ) 5 MG TABS tablet Take 1 tablet (5 mg total) by mouth 2 (two) times daily. 180 tablet 3  aspirin EC 81 MG tablet Take 81 mg by mouth daily. Swallow whole.     Carnitine-B5-B6 500-15-5 MG TABS Take 1 tablet by mouth daily.     Cholecalciferol (VITAMIN D3) 25 MCG (1000 UT) CAPS Take 1,000 Units by mouth daily.     Coenzyme Q10 300 MG CAPS Take 1 capsule by mouth daily.     Cyanocobalamin (B-12) 5000 MCG CAPS Take 1 capsule by mouth daily.     ezetimibe  (ZETIA ) 10 MG tablet Take 1 tablet (10 mg total) by mouth daily. 90 tablet 2   Garlic 1000 MG CAPS Take 1 capsule by mouth daily.     Glucosamine-Chondroitin (COSAMIN DS PO) Take 2 tablets by mouth daily.     Hawthorn 150 MG CAPS Take 1 capsule by mouth daily at 6 (six) AM.     metoprolol  succinate (TOPROL -XL) 50 MG 24 hr tablet Take 1 tablet (50 mg total) by mouth 2 (two) times daily. Take with or immediately following a meal. 60 tablet 3   metoprolol  tartrate (LOPRESSOR ) 25 MG tablet TAKE 1 TABLET BY MOUTH EVERY 6 HOURS AS NEEDED (UP TO 2 DOSES A DAY FOR BREAKTHROUGH TACHYCARDIA) 30  tablet 6   Multiple Minerals-Vitamins (CAL MAG ZINC +D3 PO) Take 1 tablet by mouth daily.     Multiple Vitamin (MULTIVITAMIN WITH MINERALS) TABS tablet Take 1 tablet by mouth daily.     Omega-3 1400 MG CAPS Take 1 capsule by mouth daily.     sacubitril -valsartan  (ENTRESTO ) 97-103 MG Take 1 tablet by mouth 2 (two) times daily. 180 tablet 3   Taurine 1000 MG CAPS Take 1 capsule by mouth daily.     vitamin E 180 MG (400 UNITS) capsule Take 400 Units by mouth daily.     No current facility-administered medications for this visit.    REVIEW OF SYSTEMS:   Constitutional: ( - ) fevers, ( - )  chills , ( - ) night sweats Eyes: ( - ) blurriness of vision, ( - ) double vision, ( - ) watery eyes Ears, nose, mouth, throat, and face: ( - ) mucositis, ( - ) sore throat Respiratory: ( - ) cough, ( - ) dyspnea, ( - ) wheezes Cardiovascular: ( - ) palpitation, ( - ) chest discomfort, ( - ) lower extremity swelling Gastrointestinal:  ( - ) nausea, ( - ) heartburn, ( - ) change in bowel habits Skin: ( - ) abnormal skin rashes Lymphatics: ( - ) new lymphadenopathy, ( - ) easy bruising Neurological: ( - ) numbness, ( - ) tingling, ( - ) new weaknesses Behavioral/Psych: ( - ) mood change, ( - ) new changes  All other systems were reviewed with the patient and are negative.  PHYSICAL EXAMINATION: Vitals:   06/13/24 0827  BP: 136/76  Pulse: 62  Resp: 17  Temp: (!) 97.2 F (36.2 C)  SpO2: 95%    Filed Weights   06/13/24 0827  Weight: 258 lb (117 kg)     GENERAL: Well-appearing middle-age Caucasian male, alert, no distress and comfortable SKIN: skin color, texture, turgor are normal, no rashes or significant lesions EYES: conjunctiva are pink and non-injected, sclera clear LUNGS: clear to auscultation and percussion with normal breathing effort HEART: regular rate & rhythm and no murmurs and no lower extremity edema Musculoskeletal: no cyanosis of digits and no clubbing  PSYCH: alert & oriented  x 3, fluent speech NEURO: no focal motor/sensory deficits  LABORATORY DATA:  I have reviewed the data as  listed    Latest Ref Rng & Units 06/09/2024    7:19 AM 12/11/2023    7:29 AM 06/12/2023    7:26 AM  CBC  WBC 4.0 - 10.5 K/uL 4.7  4.8  8.5   Hemoglobin 13.0 - 17.0 g/dL 86.2  83.9  83.9   Hematocrit 39.0 - 52.0 % 41.2  48.7  48.5   Platelets 150 - 400 K/uL 200  189  237        Latest Ref Rng & Units 06/09/2024    7:19 AM 12/11/2023    7:29 AM 06/12/2023    7:26 AM  CMP  Glucose 70 - 99 mg/dL 887  835  99   BUN 6 - 20 mg/dL 12  23  19    Creatinine 0.61 - 1.24 mg/dL 9.02  8.86  8.88   Sodium 135 - 145 mmol/L 143  137  140   Potassium 3.5 - 5.1 mmol/L 4.3  4.4  3.7   Chloride 98 - 111 mmol/L 108  101  106   CO2 22 - 32 mmol/L 30  28  26    Calcium  8.9 - 10.3 mg/dL 88.9  89.8  89.3   Total Protein 6.5 - 8.1 g/dL 6.7  7.3  7.3   Total Bilirubin 0.0 - 1.2 mg/dL 0.4  0.6  0.7   Alkaline Phos 38 - 126 U/L 72  78  77   AST 15 - 41 U/L 21  21  17    ALT 0 - 44 U/L 26  26  18     RADIOGRAPHIC STUDIES: No results found.  ASSESSMENT & PLAN Jasaun Carn is a 63 y.o. male who presents to the hematology clinic for follow up of recurrent venous thromboembolism.  We reviewed patient's history in detail do not feel that he failed Eliquis  therapy since secondary to interruption of anticoagulation therapy and undergoing surgery.  Patient has no family history of clotting disorders.   #Recurrent PE/DVT: --Recommend indefinite anticoagulation due to recurrent thrombotic episodes --Currently on Eliquis  5 mg PO twice daily. Due to large clot burden with most recent episode, recommend to continue on full strength dose.  --Labs from 06/09/2024 reviewed with patient.  WBC 4.7, hemoglobin 13.7, platelets 200, creatinine and LFTs normal.  --RTC in 6 months with labs    #Prostate cancer: --Underwent RARP plus bilateral pelvic lymph node dissections on 03/01/22 at [facility].  Pathology shows a pT3bN0 Margins Negative Gleason 4+5 = 9 . --Now under the care of [facility], last PSMA scan on 10/09/2023. Showed isolated left obturator nodal metastasis, progressive since 01/17/2022  --Received IMRT from 01/29/24-03/21/24. 45 Gy in 25 Fx.  --Will repeat PSA level today  #Hypercalcemia: --Labs from 06/09/2024 showed calcium  level of 11. -- Patient reports he consumes calcium  rich foods including milk and last week did take Tums for indigestion. Will repeat CMP today to recheck levels--  Orders Placed This Encounter  Procedures   CMP (Cancer Center only)    Standing Status:   Future    Expiration Date:   06/13/2025   PSA (For [facility])    Standing Status:   Future    Expiration Date:   06/13/2025    All questions were answered. The patient knows to call the clinic with any problems, questions or concerns.   I have spent a total of 25 minutes minutes of face-to-face and non-face-to-face time, preparing to see the patient, performing a medically appropriate examination, counseling and educating the patient,  documenting clinical information in the electronic health record, independently interpreting results and communicating results to the patient, and care coordination.   Johnston Police PA-C Dept of Hematology and Oncology [facility] Phone: [phone]   06/13/2024 8:51 AM

## 2024-06-13 ENCOUNTER — Other Ambulatory Visit

## 2024-06-13 ENCOUNTER — Ambulatory Visit: Payer: Self-pay | Admitting: Physician Assistant

## 2024-06-13 ENCOUNTER — Inpatient Hospital Stay: Admitting: Physician Assistant

## 2024-06-13 ENCOUNTER — Inpatient Hospital Stay

## 2024-06-13 VITALS — BP 136/76 | HR 62 | Temp 97.2°F | Resp 17 | Ht 69.0 in | Wt 258.0 lb

## 2024-06-13 DIAGNOSIS — I2699 Other pulmonary embolism without acute cor pulmonale: Secondary | ICD-10-CM | POA: Diagnosis not present

## 2024-06-13 DIAGNOSIS — C775 Secondary and unspecified malignant neoplasm of intrapelvic lymph nodes: Secondary | ICD-10-CM | POA: Diagnosis not present

## 2024-06-13 DIAGNOSIS — Z8546 Personal history of malignant neoplasm of prostate: Secondary | ICD-10-CM

## 2024-06-13 LAB — CMP (CANCER CENTER ONLY)
ALT: 24 U/L (ref 0–44)
AST: 19 U/L (ref 15–41)
Albumin: 4.3 g/dL (ref 3.5–5.0)
Alkaline Phosphatase: 71 U/L (ref 38–126)
Anion gap: 5 (ref 5–15)
BUN: 18 mg/dL (ref 6–20)
CO2: 27 mmol/L (ref 22–32)
Calcium: 10.6 mg/dL — ABNORMAL HIGH (ref 8.9–10.3)
Chloride: 108 mmol/L (ref 98–111)
Creatinine: 0.78 mg/dL (ref 0.61–1.24)
GFR, Estimated: >60 mL/min (ref >60)
Glucose, Bld: 111 mg/dL — ABNORMAL HIGH (ref 70–99)
Potassium: 4.2 mmol/L (ref 3.5–5.1)
Sodium: 140 mmol/L (ref 135–145)
Total Bilirubin: 0.4 mg/dL (ref 0.0–1.2)
Total Protein: 7 g/dL (ref 6.5–8.1)

## 2024-06-13 LAB — CBC WITH DIFFERENTIAL (CANCER CENTER ONLY)
Abs Immature Granulocytes: 0.03 K/uL (ref 0.00–0.07)
Basophils Absolute: 0 K/uL (ref 0.0–0.1)
Basophils Relative: 1 %
Eosinophils Absolute: 0.1 K/uL (ref 0.0–0.5)
Eosinophils Relative: 2 %
HCT: 42 % (ref 39.0–52.0)
Hemoglobin: 14.2 g/dL (ref 13.0–17.0)
Immature Granulocytes: 1 %
Lymphocytes Relative: 12 %
Lymphs Abs: 0.6 K/uL — ABNORMAL LOW (ref 0.7–4.0)
MCH: 31.1 pg (ref 26.0–34.0)
MCHC: 33.8 g/dL (ref 30.0–36.0)
MCV: 92.1 fL (ref 80.0–100.0)
Monocytes Absolute: 0.6 K/uL (ref 0.1–1.0)
Monocytes Relative: 13 %
Neutro Abs: 3.5 K/uL (ref 1.7–7.7)
Neutrophils Relative %: 71 %
Platelet Count: 202 K/uL (ref 150–400)
RBC: 4.56 MIL/uL (ref 4.22–5.81)
RDW: 12.8 % (ref 11.5–15.5)
WBC Count: 4.8 K/uL (ref 4.0–10.5)
nRBC: 0 % (ref 0.0–0.2)

## 2024-06-13 LAB — PSA: Prostatic Specific Antigen: <0.02 ng/mL (ref 0.00–4.00)

## 2024-06-20 ENCOUNTER — Ambulatory Visit: Admitting: Cardiology

## 2024-07-11 ENCOUNTER — Encounter: Payer: Self-pay | Admitting: Podiatry

## 2024-07-11 ENCOUNTER — Ambulatory Visit: Admitting: Podiatry

## 2024-07-11 DIAGNOSIS — M722 Plantar fascial fibromatosis: Secondary | ICD-10-CM

## 2024-07-11 DIAGNOSIS — M62462 Contracture of muscle, left lower leg: Secondary | ICD-10-CM

## 2024-07-11 NOTE — Progress Notes (Signed)
 Subjective:  Patient ID: Jeffrey Griffin, male    DOB: 01/27/1961,  MRN: [id number]  Chief Complaint  Patient presents with   Plantar Fasciitis    Patient is here for left foot plantar fasciitis 2nd injection    63 y.o. male presents with the above complaint.  Patient presents with continued left plantar fasciitis with underlying gastrocnemius equinus he states he is doing much better.  He still has some residual pain would like to discuss second injection denies any other acute complaints   Review of Systems: Negative except as noted in the HPI. Denies N/V/F/Ch.  Past Medical History:  Diagnosis Date   Abnormality of thoracic aorta    4.3 CM ECTATIC ASENDING    Arthritis    Atrial fibrillation (HCC)    Cardiopathy    CHF (congestive heart failure) (HCC)    SYSTOLIC   ED (erectile dysfunction) 09/11/2023   2022,2021   Elevated PSA 09/11/2023   08/22/2022, 2022, 2021   Epistaxis 03/13/2022   Hyperlipidemia    Hypertension    Lung nodule    Muscle weakness (generalized) 03/12/2023   01/01/2023, 11/30/2022, 11/13/2022, 10/23/2022   Obesity    OSA (obstructive sleep apnea)    Persistent atrial fibrillation (HCC)    WITH RVR   Pleural effusion    RIGHT UPPER LOBE AND RIGHT LOWER LOBE    Pulmonary embolism (HCC)    Stress incontinence, male 09/11/2023   03/12/2023, 01/01/2023, 12/07/2022, 11/30/2022, 11/13/2022   Urinary frequency 04/11/2022    Current Outpatient Medications:    amiodarone  (PACERONE ) 200 MG tablet, Take 1 tablet (200 mg total) by mouth 2 (two) times daily for 30 days, THEN 1 tablet (200 mg total) daily., Disp: 60 tablet, Rfl: 3   apixaban  (ELIQUIS ) 5 MG TABS tablet, Take 1 tablet (5 mg total) by mouth 2 (two) times daily., Disp: 180 tablet, Rfl: 3   aspirin EC 81 MG tablet, Take 81 mg by mouth daily. Swallow whole., Disp: , Rfl:    Carnitine-B5-B6 500-15-5 MG TABS, Take 1 tablet by mouth daily., Disp: , Rfl:    Cholecalciferol (VITAMIN D3) 25 MCG (1000 UT) CAPS, Take  1,000 Units by mouth daily., Disp: , Rfl:    Coenzyme Q10 300 MG CAPS, Take 1 capsule by mouth daily., Disp: , Rfl:    Cyanocobalamin (B-12) 5000 MCG CAPS, Take 1 capsule by mouth daily., Disp: , Rfl:    ezetimibe  (ZETIA ) 10 MG tablet, Take 1 tablet (10 mg total) by mouth daily., Disp: 90 tablet, Rfl: 2   Garlic 1000 MG CAPS, Take 1 capsule by mouth daily., Disp: , Rfl:    Glucosamine-Chondroitin (COSAMIN DS PO), Take 2 tablets by mouth daily., Disp: , Rfl:    Hawthorn 150 MG CAPS, Take 1 capsule by mouth daily at 6 (six) AM., Disp: , Rfl:    metoprolol  succinate (TOPROL -XL) 50 MG 24 hr tablet, Take 1 tablet (50 mg total) by mouth 2 (two) times daily. Take with or immediately following a meal., Disp: 60 tablet, Rfl: 3   metoprolol  tartrate (LOPRESSOR ) 25 MG tablet, TAKE 1 TABLET BY MOUTH EVERY 6 HOURS AS NEEDED (UP TO 2 DOSES A DAY FOR BREAKTHROUGH TACHYCARDIA), Disp: 30 tablet, Rfl: 6   Multiple Minerals-Vitamins (CAL MAG ZINC +D3 PO), Take 1 tablet by mouth daily., Disp: , Rfl:    Multiple Vitamin (MULTIVITAMIN WITH MINERALS) TABS tablet, Take 1 tablet by mouth daily., Disp: , Rfl:    Omega-3 1400 MG CAPS, Take 1 capsule by mouth  daily., Disp: , Rfl:    sacubitril -valsartan  (ENTRESTO ) 97-103 MG, Take 1 tablet by mouth 2 (two) times daily., Disp: 180 tablet, Rfl: 3   Taurine 1000 MG CAPS, Take 1 capsule by mouth daily., Disp: , Rfl:    vitamin E 180 MG (400 UNITS) capsule, Take 400 Units by mouth daily., Disp: , Rfl:   Social History   Tobacco Use  Smoking Status Never  Smokeless Tobacco Never  Tobacco Comments   Never smoked 04/24/24    Allergies  Allergen Reactions   Atorvastatin  Other (See Comments)    Pt reports causes joint pains   Objective:  There were no vitals filed for this visit. There is no height or weight on file to calculate BMI. Constitutional Well developed. Well nourished.  Vascular Dorsalis pedis pulses palpable bilaterally. Posterior tibial pulses palpable  bilaterally. Capillary refill normal to all digits.  No cyanosis or clubbing noted. Pedal hair growth normal.  Neurologic Normal speech. Oriented to person, place, and time. Epicritic sensation to light touch grossly present bilaterally.  Dermatologic Nails well groomed and normal in appearance. No open wounds. No skin lesions.  Orthopedic: Normal joint ROM without pain or crepitus bilaterally. No visible deformities. Tender to palpation at the calcaneal tuber left. No pain with calcaneal squeeze left. Ankle ROM diminished range of motion left. Silfverskiold Test: positive left.   Radiographs: None  Assessment:   1. Plantar fasciitis of left foot   2. Gastrocnemius equinus, left     Plan:  Patient was evaluated and treated and all questions answered.  Plantar Fasciitis, left with underlying gastrocnemius equinus - XR reviewed as above.  - Educated on icing and stretching. Instructions given.  -Second injection delivered to the plantar fascia as below. - DME: Plantar fascial brace dispensed to support the medial longitudinal arch of the foot and offload pressure from the heel and prevent arch collapse during weightbearing - Pharmacologic management: None  Pes  cavus/foot deformity -I explained to patient the etiology of pes planovalgus and relationship with heel pain/arch pain and various treatment options were discussed.  Given patient foot structure in the setting of heel pain/arch pain I believe patient will benefit from custom-made orthotics to help control the hindfoot motion support the arch of the foot and take the stress away from arches.  Patient agrees with the plan like to proceed with orthotics - Patient is awaiting orthotics   Procedure: Injection Tendon/Ligament Location: Left plantar fascia at the glabrous junction; medial approach. Skin Prep: alcohol Injectate: 0.5 cc 0.5% marcaine plain, 0.5 cc of 1% Lidocaine , 0.5 cc kenalog  10. Disposition: Patient  tolerated procedure well. Injection site dressed with a band-aid.  No follow-ups on file.

## 2024-07-20 ENCOUNTER — Other Ambulatory Visit (HOSPITAL_COMMUNITY): Payer: Self-pay | Admitting: Internal Medicine

## 2024-08-14 ENCOUNTER — Telehealth: Payer: Self-pay

## 2024-08-14 NOTE — Telephone Encounter (Signed)
 Tried to reach out to patient but was unable to connect. I did LVM for patient to call us  back and schedule an appointment to pick up.

## 2024-08-22 ENCOUNTER — Ambulatory Visit: Admitting: Podiatry

## 2024-08-22 DIAGNOSIS — M62462 Contracture of muscle, left lower leg: Secondary | ICD-10-CM

## 2024-08-22 DIAGNOSIS — Q667 Congenital pes cavus, unspecified foot: Secondary | ICD-10-CM

## 2024-08-22 DIAGNOSIS — M722 Plantar fascial fibromatosis: Secondary | ICD-10-CM

## 2024-08-22 NOTE — Progress Notes (Signed)
 Orthotics are dispensed they are functioning well no acute complaints.  Break-in period was discussed if any foot and ankle issues are in the future she will come back and see me.

## 2025-01-05 ENCOUNTER — Other Ambulatory Visit

## 2025-01-05 ENCOUNTER — Ambulatory Visit: Admitting: Hematology and Oncology
